# Patient Record
Sex: Male | Born: 1965
Health system: Southern US, Community
[De-identification: ages and names within clinical notes are randomized; demographics above are authoritative.]

## PROBLEM LIST (undated history)

## (undated) DIAGNOSIS — N400 Enlarged prostate without lower urinary tract symptoms: Secondary | ICD-10-CM

## (undated) DIAGNOSIS — Z8739 Personal history of other diseases of the musculoskeletal system and connective tissue: Secondary | ICD-10-CM

## (undated) DIAGNOSIS — H409 Unspecified glaucoma: Secondary | ICD-10-CM

## (undated) DIAGNOSIS — M109 Gout, unspecified: Secondary | ICD-10-CM

## (undated) DIAGNOSIS — M1990 Unspecified osteoarthritis, unspecified site: Secondary | ICD-10-CM

## (undated) DIAGNOSIS — M87059 Idiopathic aseptic necrosis of unspecified femur: Secondary | ICD-10-CM

## (undated) DIAGNOSIS — N189 Chronic kidney disease, unspecified: Secondary | ICD-10-CM

## (undated) DIAGNOSIS — I10 Essential (primary) hypertension: Secondary | ICD-10-CM

## (undated) DIAGNOSIS — I499 Cardiac arrhythmia, unspecified: Secondary | ICD-10-CM

## (undated) DIAGNOSIS — D631 Anemia in chronic kidney disease: Secondary | ICD-10-CM

## (undated) DIAGNOSIS — J189 Pneumonia, unspecified organism: Secondary | ICD-10-CM

## (undated) HISTORY — DX: Gout, unspecified: M10.9

## (undated) HISTORY — DX: Anemia in chronic kidney disease: D63.1

## (undated) HISTORY — PX: OTHER SURGICAL HISTORY: SHX169

## (undated) HISTORY — PX: WISDOM TOOTH EXTRACTION: SHX21

## (undated) HISTORY — DX: Chronic kidney disease, unspecified: N18.9

---
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
## 2012-06-25 ENCOUNTER — Encounter: Payer: Self-pay | Admitting: Family Medicine

## 2012-06-25 ENCOUNTER — Ambulatory Visit (INDEPENDENT_AMBULATORY_CARE_PROVIDER_SITE_OTHER): Payer: 59 | Admitting: Family Medicine

## 2012-06-25 ENCOUNTER — Telehealth: Payer: Self-pay | Admitting: Family Medicine

## 2012-06-25 VITALS — BP 136/90 | HR 99 | Temp 97.7°F | Wt 248.8 lb

## 2012-06-25 DIAGNOSIS — N529 Male erectile dysfunction, unspecified: Secondary | ICD-10-CM

## 2012-06-25 DIAGNOSIS — M109 Gout, unspecified: Secondary | ICD-10-CM

## 2012-06-25 MED ORDER — SILDENAFIL CITRATE 100 MG PO TABS: 100.0000 mg | ORAL_TABLET | Freq: Every day | ORAL | Status: DC | PRN

## 2012-06-25 NOTE — Telephone Encounter (Signed)
appt made

## 2012-06-25 NOTE — Progress Notes (Signed)
  Subjective:    Patient ID: Luke Davila, male    DOB: 07-03-1965, 47 y.o.   MRN: [id number]  HPI This 47 y.o. male presents for evaluation of erectile dysfunction. He c/o difficulty maintaining erection during sexual intercourse.    Review of Systems No chest pain, SOB, HA, dizziness, vision change, N/V, diarrhea, constipation, dysuria, urinary urgency or frequency, myalgias, arthralgias or rash.     Objective:   Physical Exam  Constitutional: He appears well-developed and well-nourished.  HENT:  Head: Normocephalic and atraumatic.  Eyes: Conjunctivae are normal. Pupils are equal, round, and reactive to light.  Cardiovascular: Normal rate and regular rhythm.   Pulmonary/Chest: Effort normal.          Assessment & Plan:  Erectile dysfunction Viagra 100mg  one half to one po qd prn sex #5w/11. Follow up in one month for CPE and CPE labs.

## 2012-06-25 NOTE — Patient Instructions (Signed)
Erectile Dysfunction Erectile dysfunction (ED) is the inability to get a good enough erection to have sexual intercourse. ED may involve:  Inability to get an erection.  Lack of enough hardness to allow penetration.  Loss of the erection before sex is finished.  Premature ejaculation.  Any combination of these problems if they occur more than 25% of the time. CAUSES  Certain drugs, such as:  Pain relievers.  Antihistamines.  Antidepressants.  Blood pressure medicines.  Water pills.  Ulcer medicines.  Muscle relaxants.  Illegal drugs.  Excessive drinking.  Psychological causes, such as:  Anxiety.  Depression.  Sadness.  Exhaustion.  Performance fear.  Stress.  Physical causes, such as:  Artery problems. This may include diabetes, smoking, liver disease, or atherosclerosis.  High blood pressure.  Hormonal problems, such as low testosterone.  Obesity.  Nerve problems. This may include back or pelvic injuries, diabetes, multiple sclerosis, Parkinson's disease, or some surgeries. SYMPTOMS  Inability to get an erection.  Lack of enough hardness to allow penetration.  Loss of the erection before sex is finished.  Premature ejaculation.  Normal erections at some times, but with frequent unsatisfactory episodes.  Orgasms that are not satisfactory in sensation or frequency.  Low sexual satisfaction in either partner because of erection problems.  A curved penis occurring with erection. The curve may cause pain or may be too curved to allow for intercourse.  Never having nighttime erections. DIAGNOSIS Your caregiver can often diagnose this condition by:  Performing a physical exam to find other diseases or specific problems with the penis.  Asking you detailed questions about the problem.  Performing blood tests to check for diabetes or to measure hormone levels.  Performing urine tests to find other underlying health  conditions.  Performing an ultrasound to check for scarring.  Performing a test to check blood flow to the penis.  Doing a sleep study at home to measure nighttime erections. TREATMENT   You may be prescribed medicines by mouth.  You may be given medicine injections into the penis.  You may be prescribed a vacuum pump with a ring.  Penile implant surgery may be performed. You may receive:  An inflatable implant.  A semi-rigid implant.  Blood vessel surgery may be performed. HOME CARE INSTRUCTIONS  Take all medicine as directed by your caregiver. Do not take any other medicines without talking to your caregiver first.  Follow your caregiver's directions for specific treatments as prescribed.  Follow up with your caregiver as directed. Document Released: 12/21/1999 Document Revised: 03/17/2011 Document Reviewed: 04/14/2010 [facility] Patient Information 2014 [place], [place].

## 2012-08-10 ENCOUNTER — Telehealth: Payer: Self-pay | Admitting: Family Medicine

## 2012-08-10 NOTE — Telephone Encounter (Signed)
appt given for tomorrow at 3:00 with [facility]

## 2012-08-11 ENCOUNTER — Encounter: Payer: Self-pay | Admitting: Family Medicine

## 2012-08-11 ENCOUNTER — Ambulatory Visit (INDEPENDENT_AMBULATORY_CARE_PROVIDER_SITE_OTHER): Payer: 59 | Admitting: Family Medicine

## 2012-08-11 VITALS — BP 132/79 | HR 85 | Temp 98.8°F | Ht 75.0 in | Wt 231.0 lb

## 2012-08-11 DIAGNOSIS — M7710 Lateral epicondylitis, unspecified elbow: Secondary | ICD-10-CM

## 2012-08-11 DIAGNOSIS — M7712 Lateral epicondylitis, left elbow: Secondary | ICD-10-CM

## 2012-08-11 DIAGNOSIS — Z0000 Encounter for general adult medical examination without abnormal findings: Secondary | ICD-10-CM

## 2012-08-11 DIAGNOSIS — M109 Gout, unspecified: Secondary | ICD-10-CM

## 2012-08-11 LAB — POCT CBC
Granulocyte percent: 69.4 %G (ref 37–80)
HCT, POC: 37.9 % — AB (ref 43.5–53.7)
Hemoglobin: 12.5 g/dL — AB (ref 14.1–18.1)
Lymph, poc: 1 (ref 0.6–3.4)
MCH, POC: 25 pg — AB (ref 27–31.2)
MCHC: 33 g/dL (ref 31.8–35.4)
MCV: 75.8 fL — AB (ref 80–97)
MPV: 7.5 fL (ref 0–99.8)
POC Granulocyte: 2.6 (ref 2–6.9)
POC LYMPH PERCENT: 25.5 %L (ref 10–50)
Platelet Count, POC: 238 10*3/uL (ref 142–424)
RBC: 5 M/uL (ref 4.69–6.13)
RDW, POC: 13.2 %
WBC: 3.8 10*3/uL — AB (ref 4.6–10.2)

## 2012-08-11 MED ORDER — NAPROXEN 500 MG PO TABS: 500.0000 mg | ORAL_TABLET | Freq: Two times a day (BID) | ORAL | Status: DC

## 2012-08-11 NOTE — Patient Instructions (Signed)
Gout  Gout is an inflammatory condition (arthritis) caused by a buildup of uric acid crystals in the joints. Uric acid is a chemical that is normally present in the blood. Under some circumstances, uric acid can form into crystals in your joints. This causes joint redness, soreness, and swelling (inflammation). Repeat attacks are common. Over time, uric acid crystals can form into masses (tophi) near a joint, causing disfigurement. Gout is treatable and often preventable.  CAUSES   The disease begins with elevated levels of uric acid in the blood. Uric acid is produced by your body when it breaks down a naturally found substance called purines. This also happens when you eat certain foods such as meats and fish. Causes of an elevated uric acid level include:   Being passed down from parent to child (heredity).   Diseases that cause increased uric acid production (obesity, psoriasis, some cancers).   Excessive alcohol use.   Diet, especially diets rich in meat and seafood.   Medicines, including certain cancer-fighting drugs (chemotherapy), diuretics, and aspirin.   Chronic kidney disease. The kidneys are no longer able to remove uric acid well.   Problems with metabolism.  Conditions strongly associated with gout include:   Obesity.   High blood pressure.   High cholesterol.   Diabetes.  Not everyone with elevated uric acid levels gets gout. It is not understood why some people get gout and others do not. Surgery, joint injury, and eating too much of certain foods are some of the factors that can lead to gout.  SYMPTOMS    An attack of gout comes on quickly. It causes intense pain with redness, swelling, and warmth in a joint.   Fever can occur.   Often, only one joint is involved. Certain joints are more commonly involved:   Base of the big toe.   Knee.   Ankle.   Wrist.   Finger.  Without treatment, an attack usually goes away in a few days to weeks. Between attacks, you usually will not have  symptoms, which is different from many other forms of arthritis.  DIAGNOSIS   Your caregiver will suspect gout based on your symptoms and exam. Removal of fluid from the joint (arthrocentesis) is done to check for uric acid crystals. Your caregiver will give you a medicine that numbs the area (local anesthetic) and use a needle to remove joint fluid for exam. Gout is confirmed when uric acid crystals are seen in joint fluid, using a special microscope. Sometimes, blood, urine, and X-ray tests are also used.  TREATMENT   There are 2 phases to gout treatment: treating the sudden onset (acute) attack and preventing attacks (prophylaxis).  Treatment of an Acute Attack   Medicines are used. These include anti-inflammatory medicines or steroid medicines.   An injection of steroid medicine into the affected joint is sometimes necessary.   The painful joint is rested. Movement can worsen the arthritis.   You may use warm or cold treatments on painful joints, depending which works best for you.   Discuss the use of coffee, vitamin C, or cherries with your caregiver. These may be helpful treatment options.  Treatment to Prevent Attacks  After the acute attack subsides, your caregiver may advise prophylactic medicine. These medicines either help your kidneys eliminate uric acid from your body or decrease your uric acid production. You may need to stay on these medicines for a very long time.  The early phase of treatment with prophylactic medicine can be associated   with an increase in acute gout attacks. For this reason, during the first few months of treatment, your caregiver may also advise you to take medicines usually used for acute gout treatment. Be sure you understand your caregiver's directions.  You should also discuss dietary treatment with your caregiver. Certain foods such as meats and fish can increase uric acid levels. Other foods such as dairy can decrease levels. Your caregiver can give you a list of foods  to avoid.  HOME CARE INSTRUCTIONS    Do not take aspirin to relieve pain. This raises uric acid levels.   Only take over-the-counter or prescription medicines for pain, discomfort, or fever as directed by your caregiver.   Rest the joint as much as possible. When in bed, keep sheets and blankets off painful areas.   Keep the affected joint raised (elevated).   Use crutches if the painful joint is in your leg.   Drink enough water and fluids to keep your urine clear or pale yellow. This helps your body get rid of uric acid. Do not drink alcoholic beverages. They slow the passage of uric acid.   Follow your caregiver's dietary instructions. Pay careful attention to the amount of protein you eat. Your daily diet should emphasize fruits, vegetables, whole grains, and fat-free or low-fat milk products.   Maintain a healthy body weight.  SEEK MEDICAL CARE IF:    You have an oral temperature above 102 F (38.9 C).   You develop diarrhea, vomiting, or any side effects from medicines.   You do not feel better in 24 hours, or you are getting worse.  SEEK IMMEDIATE MEDICAL CARE IF:    Your joint becomes suddenly more tender and you have:   Chills.   An oral temperature above 102 F (38.9 C), not controlled by medicine.  MAKE SURE YOU:    Understand these instructions.   Will watch your condition.   Will get help right away if you are not doing well or get worse.  Document Released: 12/21/1999 Document Revised: 03/17/2011 Document Reviewed: 04/02/2009  [employer] Patient Information 2014 [employer].

## 2012-08-11 NOTE — Progress Notes (Signed)
  Subjective:    Patient ID: Luke Davila, male    DOB: [birth year], 47 y.o.   MRN: [id number]  HPI  This 47 y.o. male presents for evaluation of left lateral elbow discomfort. He has had some pain in his left elbow ever since he worked out with [employer].. He states he has hx of gout.  He has ED and is taking viagra without difficulties.  Review of Systems No chest pain, SOB, HA, dizziness, vision change, N/V, diarrhea, constipation, dysuria, urinary urgency or frequency, myalgias, arthralgias or rash.     Objective:   Physical Exam  Vital signs noted  Well developed well nourished male.  HEENT - Head atraumatic Normocephalic                Eyes - PERRLA, Conjuctiva - clear Sclera- Clear EOMI                Ears - EAC's Wnl TM's Wnl Gross Hearing WNL                Nose - Nares patent                 Throat - oropharanx wnl Respiratory - Lungs CTA bilateral Cardiac - RRR S1 and S2 without murmur GI - Abdomen soft Nontender and bowel sounds active x 4 Extremities - No edema. Neuro - Grossly intact.      Assessment & Plan:  Lateral epicondylitis, left - Plan: naproxen (NAPROSYN) 500 MG tablet po bid x 10 days and if not better then follow up.  Routine general medical examination at a health care facility - Plan: POCT CBC, CMP14+EGFR, PSA, total and free, TSH, Lipid panel  Gout - Plan: Uric acid.  Discussed going on low purine diet.  Erectile Dysfunction - Continue viagra and voucher given to patient.

## 2012-08-12 ENCOUNTER — Other Ambulatory Visit: Payer: Self-pay | Admitting: Family Medicine

## 2012-08-12 DIAGNOSIS — D649 Anemia, unspecified: Secondary | ICD-10-CM

## 2012-08-12 DIAGNOSIS — M109 Gout, unspecified: Secondary | ICD-10-CM

## 2012-08-12 LAB — CMP14+EGFR
ALT: 27 IU/L (ref 0–44)
AST: 36 IU/L (ref 0–40)
Albumin/Globulin Ratio: 1.9 (ref 1.1–2.5)
Albumin: 4.9 g/dL (ref 3.5–5.5)
Alkaline Phosphatase: 120 IU/L — ABNORMAL HIGH (ref 39–117)
BUN/Creatinine Ratio: 7 — ABNORMAL LOW (ref 9–20)
BUN: 7 mg/dL (ref 6–24)
CO2: 26 mmol/L (ref 18–29)
Calcium: 9.9 mg/dL (ref 8.7–10.2)
Chloride: 97 mmol/L (ref 97–108)
Creatinine, Ser: 0.96 mg/dL (ref 0.76–1.27)
GFR calc Af Amer: 109 mL/min/{1.73_m2} (ref >59)
GFR calc non Af Amer: 94 mL/min/{1.73_m2} (ref >59)
Globulin, Total: 2.6 g/dL (ref 1.5–4.5)
Glucose: 82 mg/dL (ref 65–99)
Potassium: 4.7 mmol/L (ref 3.5–5.2)
Sodium: 135 mmol/L (ref 134–144)
Total Bilirubin: 0.5 mg/dL (ref 0.0–1.2)
Total Protein: 7.5 g/dL (ref 6.0–8.5)

## 2012-08-12 LAB — TSH: TSH: 2.02 u[IU]/mL (ref 0.450–4.500)

## 2012-08-12 LAB — LIPID PANEL
Chol/HDL Ratio: 3.7 ratio units (ref 0.0–5.0)
Cholesterol, Total: 214 mg/dL — ABNORMAL HIGH (ref 100–199)
HDL: 58 mg/dL (ref >39)
LDL Calculated: 121 mg/dL — ABNORMAL HIGH (ref 0–99)
Triglycerides: 175 mg/dL — ABNORMAL HIGH (ref 0–149)
VLDL Cholesterol Cal: 35 mg/dL (ref 5–40)

## 2012-08-12 LAB — PSA, TOTAL AND FREE
PSA, Free Pct: 12.5 %
PSA, Free: 0.05 ng/mL
PSA: 0.4 ng/mL (ref 0.0–4.0)

## 2012-08-12 LAB — URIC ACID: Uric Acid: 7.2 mg/dL (ref 3.7–8.6)

## 2012-08-12 MED ORDER — ALLOPURINOL 100 MG PO TABS: 100.0000 mg | ORAL_TABLET | Freq: Every day | ORAL | Status: DC

## 2012-08-23 ENCOUNTER — Telehealth: Payer: Self-pay | Admitting: Family Medicine

## 2012-08-24 NOTE — Telephone Encounter (Signed)
Pt aware of all labs 

## 2012-09-09 ENCOUNTER — Other Ambulatory Visit (INDEPENDENT_AMBULATORY_CARE_PROVIDER_SITE_OTHER): Payer: 59

## 2012-09-09 DIAGNOSIS — Z1212 Encounter for screening for malignant neoplasm of rectum: Secondary | ICD-10-CM

## 2012-09-14 ENCOUNTER — Encounter: Payer: Self-pay | Admitting: *Deleted

## 2012-12-01 ENCOUNTER — Telehealth: Payer: Self-pay | Admitting: Family Medicine

## 2012-12-01 NOTE — Telephone Encounter (Signed)
lmom 

## 2012-12-03 ENCOUNTER — Ambulatory Visit (INDEPENDENT_AMBULATORY_CARE_PROVIDER_SITE_OTHER): Payer: 59 | Admitting: Family Medicine

## 2012-12-03 DIAGNOSIS — R350 Frequency of micturition: Secondary | ICD-10-CM

## 2012-12-03 DIAGNOSIS — N400 Enlarged prostate without lower urinary tract symptoms: Secondary | ICD-10-CM

## 2012-12-03 DIAGNOSIS — M129 Arthropathy, unspecified: Secondary | ICD-10-CM

## 2012-12-03 DIAGNOSIS — M1990 Unspecified osteoarthritis, unspecified site: Secondary | ICD-10-CM

## 2012-12-03 DIAGNOSIS — R300 Dysuria: Secondary | ICD-10-CM

## 2012-12-03 MED ORDER — TAMSULOSIN HCL 0.4 MG PO CAPS: 0.4000 mg | ORAL_CAPSULE | Freq: Every day | ORAL | Status: DC

## 2012-12-03 MED ORDER — MELOXICAM 15 MG PO TABS: 15.0000 mg | ORAL_TABLET | Freq: Every day | ORAL | Status: DC

## 2012-12-03 NOTE — Addendum Note (Signed)
Addended by: Prescott Gum on: 12/03/2012 10:58 AM   Modules accepted: Orders

## 2012-12-03 NOTE — Progress Notes (Signed)
   Subjective:    Patient ID: Luke Davila, male    DOB: 10-27-1965, 47 y.o.   MRN: [id number]  HPI Patient presents today with c/o urinary frequency and arthritis sx's in the am. He has some urinary hesitency and nocturia.  Review of Systems C/o arthralgias, urinary frequency No chest pain, SOB, HA, dizziness, vision change, N/V, diarrhea, constipation or rash.     Objective:   Physical Exam Vital signs noted  Well developed well nourished male.  HEENT - Head atraumatic Normocephalic                Eyes - PERRLA, Conjuctiva - clear Sclera- Clear EOMI                Ears - EAC's Wnl TM's Wnl Gross Hearing WNL                Nose - Nares patent                 Throat - oropharanx wnl Respiratory - Lungs CTA bilateral Cardiac - RRR S1 and S2 without murmur GI - Abdomen soft Nontender and bowel sounds active x 4 Extremities - No edema. Neuro - Grossly intact.       Assessment & Plan:  Dysuria - Plan: CANCELED: POCT UA - Microscopic Only, CANCELED: POCT urinalysis dipstick  Urine frequency - Plan: POCT UA - Microscopic Only, POCT urinalysis dipstick  BPH (benign prostatic hyperplasia) - Plan: tamsulosin (FLOMAX) 0.4 MG CAPS capsule  Arthritis - Plan: meloxicam (MOBIC) 15 MG tablet  Deatra Canter FNP

## 2012-12-15 ENCOUNTER — Other Ambulatory Visit (INDEPENDENT_AMBULATORY_CARE_PROVIDER_SITE_OTHER): Payer: 59

## 2012-12-15 DIAGNOSIS — R350 Frequency of micturition: Secondary | ICD-10-CM

## 2012-12-15 LAB — POCT URINALYSIS DIPSTICK
Bilirubin, UA: NEGATIVE
Glucose, UA: NEGATIVE
Ketones, UA: NEGATIVE
Leukocytes, UA: NEGATIVE
Nitrite, UA: NEGATIVE
Spec Grav, UA: 1.01
Urobilinogen, UA: NEGATIVE
pH, UA: 7.5

## 2012-12-15 LAB — POCT UA - MICROSCOPIC ONLY
Casts, Ur, LPF, POC: NEGATIVE
Crystals, Ur, HPF, POC: NEGATIVE
Mucus, UA: NEGATIVE
WBC, Ur, HPF, POC: NEGATIVE
Yeast, UA: NEGATIVE

## 2012-12-15 NOTE — Progress Notes (Signed)
Patient came in for labs only.

## 2012-12-20 ENCOUNTER — Telehealth: Payer: Self-pay | Admitting: Family Medicine

## 2012-12-23 NOTE — Telephone Encounter (Signed)
Luke Davila can you review urine test results for 12/10. Pt wants results.

## 2012-12-23 NOTE — Telephone Encounter (Signed)
Normal urine results

## 2012-12-24 NOTE — Telephone Encounter (Signed)
Patient aware of results.

## 2013-04-18 ENCOUNTER — Ambulatory Visit (INDEPENDENT_AMBULATORY_CARE_PROVIDER_SITE_OTHER): Payer: 59 | Admitting: Diagnostic Neuroimaging

## 2013-04-18 ENCOUNTER — Encounter: Payer: Self-pay | Admitting: Diagnostic Neuroimaging

## 2013-04-18 ENCOUNTER — Encounter (INDEPENDENT_AMBULATORY_CARE_PROVIDER_SITE_OTHER): Payer: Self-pay

## 2013-04-18 VITALS — BP 135/64 | HR 90 | Ht 75.0 in | Wt 243.0 lb

## 2013-04-18 DIAGNOSIS — H538 Other visual disturbances: Secondary | ICD-10-CM

## 2013-04-18 DIAGNOSIS — H53481 Generalized contraction of visual field, right eye: Secondary | ICD-10-CM

## 2013-04-18 DIAGNOSIS — H53489 Generalized contraction of visual field, unspecified eye: Secondary | ICD-10-CM

## 2013-04-18 MED ORDER — THERA VITAL M PO TABS: 1.0000 | ORAL_TABLET | Freq: Every day | ORAL | Status: DC

## 2013-04-18 MED ORDER — ASPIRIN 81 MG PO TABS: 81.0000 mg | ORAL_TABLET | Freq: Every day | ORAL | Status: DC

## 2013-04-18 NOTE — Patient Instructions (Signed)
Start aspirin 81 mg daily.  Start daily multivitamin.  Reduce alcohol use (maximum 1 beer per day).  I will order additional testing.

## 2013-04-18 NOTE — Progress Notes (Signed)
[facility]  PATIENT: Luke RoseSamuel Cahue DOB: 02/27/1965  REFERRING CLINICIAN: Lyla SonY Le, OD HISTORY FROM: patient  REASON FOR VISIT: new consult    HISTORICAL  CHIEF COMPLAINT:  Chief Complaint  Patient presents with  . Eye Problem    R eye, blurry vision    HISTORY OF PRESENT ILLNESS:   48 year old male here for evaluation of right eye visual problem.  End of December 2014 patient woke up with right hip pain and weakness. Around the same time he noticed right eye visual blurring. Patient went to the emergency room and was evaluated. He was given a steroid injection and tablets for the right hip problem. He was given topical medications for his right eye problem. His right hip problem gradually improve. His right eye problem did not. He went to the PCP, eye Dr., and a second eye doctor for this problem. The second eye Dr. noted visual field defect in the right eye as well as right optic nerve pallor. Patient referred to me for neurologic evaluation.  No symptoms of left eye. No symptoms of left arm and left leg. No slurred speech or trouble talking. No prodromal symptoms or triggering events.  REVIEW OF SYSTEMS: Full 14 system review of systems performed and notable only for feeling cold blurred vision loss of vision rash.  ALLERGIES: No Known Allergies  HOME MEDICATIONS: Outpatient Prescriptions Prior to Visit  Medication Sig Dispense Refill  . allopurinol (ZYLOPRIM) 100 MG tablet Take 1 tablet (100 mg total) by mouth daily.  30 tablet  6  . meloxicam (MOBIC) 15 MG tablet Take 1 tablet (15 mg total) by mouth daily.  30 tablet  11  . naproxen (NAPROSYN) 500 MG tablet Take 1 tablet (500 mg total) by mouth 2 (two) times daily with a meal.  30 tablet  0  . sildenafil (VIAGRA) 100 MG tablet Take 1 tablet (100 mg total) by mouth daily as needed for erectile dysfunction.  5 tablet  11  . tamsulosin (FLOMAX) 0.4 MG CAPS capsule Take 1 capsule (0.4 mg total) by mouth  daily.  30 capsule  11   No facility-administered medications prior to visit.    PAST MEDICAL HISTORY: Past Medical History  Diagnosis Date  . Gout     PAST SURGICAL HISTORY: Past Surgical History  Procedure Laterality Date  . Foot surgery Left 1985  . Left hand surgery      FAMILY HISTORY: History reviewed. No pertinent family history.  SOCIAL HISTORY:  History   Social History  . Marital Status: Married    Spouse Name: Ramona    Number of Children: 2  . Years of Education: 12th   Occupational History  .      [employer]   Social History Main Topics  . Smoking status: Never Smoker   . Smokeless tobacco: Never Used  . Alcohol Use: Yes     Comment: 3 beers daily  . Drug Use: No  . Sexual Activity: Not on file   Other Topics Concern  . Not on file   Social History Narrative   Patient lives at home with his family.   Caffeine Use: 1 soda daily     PHYSICAL EXAM  Filed Vitals:   04/18/13 0904  BP: 135/64  Pulse: 90  Height: 6\' 3"  (1.905 m)  Weight: 243 lb (110.224 kg)    Not recorded    Body mass index is 30.37 kg/(m^2).  GENERAL EXAM: Patient is in no distress; well developed,  nourished and groomed; neck is supple  CARDIOVASCULAR: Regular rate and rhythm, no murmurs, no carotid bruits  NEUROLOGIC: MENTAL STATUS: awake, alert, oriented to person, place and time, recent and remote memory intact, normal attention and concentration, language fluent, comprehension intact, naming intact, fund of knowledge appropriate CRANIAL NERVE: no papilledema on fundoscopic exam, RELATIVE RIGHT AFFERENT PUPILLARY DEFECT, LEFT PUPIL NORMAL, visual fields full to confrontation, RIGHT EYE 20/100, LEFT EYE 20/20, extraocular muscles intact, no nystagmus, facial sensation and strength symmetric, hearing intact, palate elevates symmetrically, uvula midline, shoulder shrug symmetric, tongue midline. MOTOR: normal bulk and tone, full strength in the BUE,  BLE SENSORY: normal and symmetric to light touch, pinprick, temperature, vibration COORDINATION: finger-nose-finger, fine finger movements, heel-shin normal REFLEXES: deep tendon reflexes BRISK and symmetric; DOWN GOING TOES, POSITIVE HOFFMANS, POSITIVE CROSSED ADDUCTORS GAIT/STATION: narrow based gait; STIFF GAIT, able to walk on toes, heels and tandem; romberg is negative    DIAGNOSTIC DATA (LABS, IMAGING, TESTING) - I reviewed patient records, labs, notes, testing and imaging myself where available.  Lab Results  Component Value Date   WBC 3.8* 08/11/2012   HGB 12.5* 08/11/2012   HCT 37.9* 08/11/2012   MCV 75.8* 08/11/2012      Component Value Date/Time   NA 135 08/11/2012 1558   K 4.7 08/11/2012 1558   CL 97 08/11/2012 1558   CO2 26 08/11/2012 1558   GLUCOSE 82 08/11/2012 1558   BUN 7 08/11/2012 1558   CREATININE 0.96 08/11/2012 1558   CALCIUM 9.9 08/11/2012 1558   PROT 7.5 08/11/2012 1558   AST 36 08/11/2012 1558   ALT 27 08/11/2012 1558   ALKPHOS 120* 08/11/2012 1558   BILITOT 0.5 08/11/2012 1558   GFRNONAA 94 08/11/2012 1558   GFRAA 109 08/11/2012 1558   Lab Results  Component Value Date   HDL 58 08/11/2012   LDLCALC 098121* 08/11/2012   TRIG 175* 08/11/2012   CHOLHDL 3.7 08/11/2012   No results found for this basename: HGBA1C   No results found for this basename: VITAMINB12   Lab Results  Component Value Date   TSH 2.020 08/11/2012    04/04/13 Vision field testing - my review of humphrey matrix shows right eye, right inferior nasal defect; not temporal defect.    ASSESSMENT AND PLAN  48 y.o. year old male here with sudden right eye vision blurred (Dec 2014). Also with right hip pain/weakness, which has resolved. Exam notable for relative afferent pupillary defect in the right eye and diffuse hyperreflexia.   Localization: optic nerve vs optic chiasm  Ddx: optic neuropathy (ischemic, infectious, inflamm, compressive, toxic, metabolic), CNS autoimmune/inflamm disease, CNS vascular disease  PLAN: -  additional workup as below - will consider second opinion with retinal specialist   Orders Placed This Encounter  Procedures  . MR Brain W Wo Contrast  . MR Orbits WO/W Cm  . US Carotid Bilateral  . Sedimentation Rate  . C-reactive Protein  . TSH  . Hemoglobin A1c  . Lipid Panel  . Vitamin B12  . Vitamin B1  . Folate RBC  . Angiotensin converting enzyme  . Lyme, Total Ab Test/Reflex  . RPR  . ANA w/Reflex  . HIV antibody  . 2D Echocardiogram without contrast    Meds ordered this encounter  Medications  . triamcinolone cream (KENALOG) 0.1 %    Sig: Apply 1 application topically daily.  Marland Kitchen. aspirin 81 MG tablet    Sig: Take 1 tablet (81 mg total) by mouth daily.  . Multiple  Vitamins-Minerals (MULTIVITAMIN) tablet    Sig: Take 1 tablet by mouth daily.    Return in about 6 weeks (around 05/30/2013).    Suanne Marker, MD 04/18/2013, 10:03 AM Certified in Neurology, Neurophysiology and Neuroimaging  [facility] [address], [address] [place], [place] [address] [phone]

## 2013-04-19 LAB — VITAMIN B1

## 2013-04-21 ENCOUNTER — Ambulatory Visit (INDEPENDENT_AMBULATORY_CARE_PROVIDER_SITE_OTHER): Payer: 59

## 2013-04-21 DIAGNOSIS — H53489 Generalized contraction of visual field, unspecified eye: Secondary | ICD-10-CM

## 2013-04-21 DIAGNOSIS — H538 Other visual disturbances: Secondary | ICD-10-CM

## 2013-04-21 DIAGNOSIS — H53481 Generalized contraction of visual field, right eye: Secondary | ICD-10-CM

## 2013-04-21 MED ORDER — GADOPENTETATE DIMEGLUMINE 469.01 MG/ML IV SOLN: 20.0000 mL | Freq: Once | INTRAVENOUS | Status: AC | PRN

## 2013-04-22 LAB — HEMOGLOBIN A1C
Est. average glucose Bld gHb Est-mCnc: 120 mg/dL
Hgb A1c MFr Bld: 5.8 % — ABNORMAL HIGH (ref 4.8–5.6)

## 2013-04-22 LAB — RPR: SYPHILIS RPR SCR: NONREACTIVE

## 2013-04-22 LAB — FOLATE RBC
Folate, Hemolysate: 426.9 ng/mL
Folate, RBC: 1170 ng/mL (ref 499–1504)
HCT: 36.5 % — ABNORMAL LOW (ref 37.5–51.0)

## 2013-04-22 LAB — ANGIOTENSIN CONVERTING ENZYME: Angio Convert Enzyme: 112 U/L — ABNORMAL HIGH (ref 14–82)

## 2013-04-22 LAB — LIPID PANEL
Chol/HDL Ratio: 4.2 ratio units (ref 0.0–5.0)
Cholesterol, Total: 243 mg/dL — ABNORMAL HIGH (ref 100–199)
HDL: 58 mg/dL (ref >39)
LDL Calculated: 160 mg/dL — ABNORMAL HIGH (ref 0–99)
Triglycerides: 126 mg/dL (ref 0–149)
VLDL CHOLESTEROL CAL: 25 mg/dL (ref 5–40)

## 2013-04-22 LAB — HIV ANTIBODY (ROUTINE TESTING W REFLEX)
HIV 1/O/2 Abs-Index Value: <1 (ref <1.00)
HIV-1/HIV-2 Ab: NONREACTIVE

## 2013-04-22 LAB — LYME, TOTAL AB TEST/REFLEX: Lyme IgG/IgM Ab: <0.91 {ISR} (ref 0.00–0.90)

## 2013-04-22 LAB — VITAMIN B12: VITAMIN B 12: 463 pg/mL (ref 211–946)

## 2013-04-22 LAB — ANA W/REFLEX: ANA: NEGATIVE

## 2013-04-22 LAB — TSH: TSH: 2.62 u[IU]/mL (ref 0.450–4.500)

## 2013-04-22 LAB — C-REACTIVE PROTEIN: CRP: 0.9 mg/L (ref 0.0–4.9)

## 2013-04-22 LAB — VITAMIN B1, WHOLE BLOOD: THIAMINE: 75.5 nmol/L (ref 66.5–200.0)

## 2013-04-22 LAB — SEDIMENTATION RATE: SED RATE: 5 mm/h (ref 0–15)

## 2013-04-26 ENCOUNTER — Telehealth: Payer: Self-pay | Admitting: *Deleted

## 2013-04-26 NOTE — Telephone Encounter (Signed)
Pt called inquiring about results of MRI and BW.  Please call patient at earliest convenience.  Thanks

## 2013-04-27 ENCOUNTER — Telehealth: Payer: Self-pay | Admitting: *Deleted

## 2013-04-27 NOTE — Telephone Encounter (Signed)
Spoke with patient's wife Luke Davila(Ramona), informed her of patient's appointment on 05/06/13 @ 1 pm with arrival time of 12:45 pm, informed Ramona that if he is unable to keep this appointment to call [phone], to reschedule.  tient

## 2013-04-27 NOTE — Telephone Encounter (Signed)
Pt is calling requesting MRI and lab work results. Please advise

## 2013-05-06 ENCOUNTER — Ambulatory Visit (INDEPENDENT_AMBULATORY_CARE_PROVIDER_SITE_OTHER): Payer: 59

## 2013-05-06 ENCOUNTER — Other Ambulatory Visit (HOSPITAL_COMMUNITY): Payer: Self-pay

## 2013-05-06 DIAGNOSIS — H538 Other visual disturbances: Secondary | ICD-10-CM

## 2013-05-06 DIAGNOSIS — H53481 Generalized contraction of visual field, right eye: Secondary | ICD-10-CM

## 2013-05-06 DIAGNOSIS — H53489 Generalized contraction of visual field, unspecified eye: Secondary | ICD-10-CM

## 2013-05-06 NOTE — Telephone Encounter (Signed)
pls call pt with normal MRI and labs. -VRP

## 2013-05-09 ENCOUNTER — Encounter (HOSPITAL_COMMUNITY): Payer: Self-pay | Admitting: Cardiology

## 2013-05-09 NOTE — Telephone Encounter (Signed)
Called pt to inform him per Dr. Marjory LiesPenumalli that his MRI and Lab results were normal and if he has any other problems, questions or concerns to call the office. Pt verbalized understanding.

## 2013-05-11 ENCOUNTER — Other Ambulatory Visit: Payer: Self-pay | Admitting: *Deleted

## 2013-05-11 NOTE — Telephone Encounter (Signed)
You last filled this 06/2012 for 1 year, last seen 12/03/12

## 2013-05-12 ENCOUNTER — Other Ambulatory Visit: Payer: Self-pay | Admitting: Family Medicine

## 2013-05-12 MED ORDER — SILDENAFIL CITRATE 100 MG PO TABS: 100.0000 mg | ORAL_TABLET | Freq: Every day | ORAL | Status: DC | PRN

## 2013-05-31 ENCOUNTER — Ambulatory Visit (INDEPENDENT_AMBULATORY_CARE_PROVIDER_SITE_OTHER): Payer: 59 | Admitting: *Deleted

## 2013-05-31 ENCOUNTER — Encounter (INDEPENDENT_AMBULATORY_CARE_PROVIDER_SITE_OTHER): Payer: Self-pay

## 2013-05-31 ENCOUNTER — Other Ambulatory Visit: Payer: Self-pay | Admitting: *Deleted

## 2013-05-31 ENCOUNTER — Ambulatory Visit (INDEPENDENT_AMBULATORY_CARE_PROVIDER_SITE_OTHER): Payer: 59 | Admitting: Diagnostic Neuroimaging

## 2013-05-31 ENCOUNTER — Encounter: Payer: Self-pay | Admitting: Diagnostic Neuroimaging

## 2013-05-31 ENCOUNTER — Telehealth: Payer: Self-pay | Admitting: *Deleted

## 2013-05-31 VITALS — BP 129/86 | HR 85 | Temp 98.6°F | Ht 75.5 in | Wt 247.0 lb

## 2013-05-31 VITALS — BP 149/94 | HR 79

## 2013-05-31 DIAGNOSIS — H469 Unspecified optic neuritis: Secondary | ICD-10-CM

## 2013-05-31 DIAGNOSIS — G360 Neuromyelitis optica [Devic]: Secondary | ICD-10-CM

## 2013-05-31 MED ORDER — SODIUM CHLORIDE 0.9 % IV SOLN
1000.0000 mg | INTRAVENOUS | Status: DC
  Administered 2013-05-31: 1000 mg via INTRAVENOUS

## 2013-05-31 NOTE — Telephone Encounter (Signed)
I called [facility], spoke to [facility] in pharmacy.  They will call pt to schedule for tomorrow.  I LMVM for pt on cell # to expect there call.  I left there phone [phone].

## 2013-05-31 NOTE — Progress Notes (Signed)
Pt here for appt.   Order for solumedrol 1000mg  IV daily x 3 days.  First dose here in clinic next 2 doses via HH.

## 2013-05-31 NOTE — Patient Instructions (Signed)
I will treat you with IV steroids for 3 days.  I will check MRI spine studies.  I will check chest xray and other lab testing.

## 2013-05-31 NOTE — Progress Notes (Signed)
[facility]  PATIENT: Luke Davila DOB: 28-Mar-1965  REFERRING CLINICIAN:   HISTORY FROM: patient  REASON FOR VISIT: follow up   HISTORICAL  CHIEF COMPLAINT:  Chief Complaint  Patient presents with  . Follow-up    blurred vision, 6 wks per instructions, rm 7    HISTORY OF PRESENT ILLNESS:   UPDATE 05/31/13: Right eye vision problems stable. No change. Still with stiffness in legs, balance diff. No new neurologic symptoms.   PRIOR HPI (04/18/13): 48 year old male here for evaluation of right eye visual problem. End of December 2014 patient woke up with right hip pain and weakness. Around the same time he noticed right eye visual blurring. Patient went to the emergency room and was evaluated. He was given a steroid injection and tablets for the right hip problem. He was given topical medications for his right eye problem. His right hip problem gradually improve. His right eye problem did not. He went to the PCP, eye Dr., and a second eye doctor for this problem. The second eye doctor noted visual field defect in the right eye as well as right optic nerve pallor. Patient referred to me for neurologic evaluation. No symptoms of left eye. No symptoms of left arm and left leg. No slurred speech or trouble talking. No prodromal symptoms or triggering events.  REVIEW OF SYSTEMS: Full 14 system review of systems performed and notable only for blurred vision loss of vision.   ALLERGIES: No Known Allergies  HOME MEDICATIONS: Outpatient Prescriptions Prior to Visit  Medication Sig Dispense Refill  . allopurinol (ZYLOPRIM) 100 MG tablet Take 1 tablet (100 mg total) by mouth daily.  30 tablet  6  . aspirin 81 MG tablet Take 1 tablet (81 mg total) by mouth daily.      . meloxicam (MOBIC) 15 MG tablet Take 1 tablet (15 mg total) by mouth daily.  30 tablet  11  . Multiple Vitamins-Minerals (MULTIVITAMIN) tablet Take 1 tablet by mouth daily.      . naproxen (NAPROSYN) 500 MG  tablet Take 1 tablet (500 mg total) by mouth 2 (two) times daily with a meal.  30 tablet  0  . sildenafil (VIAGRA) 100 MG tablet Take 1 tablet (100 mg total) by mouth daily as needed for erectile dysfunction.  5 tablet  11  . tamsulosin (FLOMAX) 0.4 MG CAPS capsule Take 1 capsule (0.4 mg total) by mouth daily.  30 capsule  11  . triamcinolone cream (KENALOG) 0.1 % Apply 1 application topically daily.       No facility-administered medications prior to visit.    PAST MEDICAL HISTORY: Past Medical History  Diagnosis Date  . Gout     PAST SURGICAL HISTORY: Past Surgical History  Procedure Laterality Date  . Foot surgery Left 1985  . Left hand surgery      FAMILY HISTORY: No family history on file.  SOCIAL HISTORY:  History   Social History  . Marital Status: Married    Spouse Name: Ramona    Number of Children: 2  . Years of Education: 12th   Occupational History  .      Public house manager   Social History Main Topics  . Smoking status: Never Smoker   . Smokeless tobacco: Never Used  . Alcohol Use: Yes     Comment: 3 beers daily  . Drug Use: No  . Sexual Activity: Not on file   Other Topics Concern  . Not on file   Social History  Narrative   Patient lives at home with his family.   Caffeine Use: 1 soda daily   Patient is right handed     PHYSICAL EXAM  Filed Vitals:   05/31/13 1106  BP: 129/86  Pulse: 85  Temp: 98.6 F (37 C)  TempSrc: Oral  Height: 6' 3.5" (1.918 m)  Weight: 247 lb (112.038 kg)    Not recorded    Body mass index is 30.46 kg/(m^2).  GENERAL EXAM: Patient is in no distress; well developed, nourished and groomed; neck is supple  CARDIOVASCULAR: Regular rate and rhythm, no murmurs, no carotid bruits  NEUROLOGIC: MENTAL STATUS: awake, alert, oriented to person, place and time, recent and remote memory intact, normal attention and concentration, language fluent, comprehension intact, naming intact, fund of knowledge  appropriate CRANIAL NERVE: no papilledema on fundoscopic exam, RIGHT AFFERENT PUPILLARY DEFECT, LEFT PUPIL NORMAL, visual fields full to confrontation, RIGHT EYE 20/200, LEFT EYE 20/20, extraocular muscles intact, no nystagmus, facial sensation and strength symmetric, hearing intact, palate elevates symmetrically, uvula midline, shoulder shrug symmetric, tongue midline. MOTOR: normal bulk and tone, full strength in the BUE, BLE SENSORY: normal and symmetric to light touch, pinprick, temperature, vibration COORDINATION: finger-nose-finger, fine finger movements normal REFLEXES: deep tendon reflexes BRISK and symmetric; DOWN GOING TOES, POSITIVE HOFFMANS, POSITIVE CROSSED ADDUCTORS GAIT/STATION: narrow based gait; STIFF GAIT, able to walk on toes, heels and tandem; romberg is negative    DIAGNOSTIC DATA (LABS, IMAGING, TESTING) - I reviewed patient records, labs, notes, testing and imaging myself where available.  Lab Results  Component Value Date   WBC 3.8* 08/11/2012   HGB 12.5* 08/11/2012   HCT 36.5* 04/18/2013   MCV 75.8* 08/11/2012      Component Value Date/Time   NA 135 08/11/2012 1558   K 4.7 08/11/2012 1558   CL 97 08/11/2012 1558   CO2 26 08/11/2012 1558   GLUCOSE 82 08/11/2012 1558   BUN 7 08/11/2012 1558   CREATININE 0.96 08/11/2012 1558   CALCIUM 9.9 08/11/2012 1558   PROT 7.5 08/11/2012 1558   AST 36 08/11/2012 1558   ALT 27 08/11/2012 1558   ALKPHOS 120* 08/11/2012 1558   BILITOT 0.5 08/11/2012 1558   GFRNONAA 94 08/11/2012 1558   GFRAA 109 08/11/2012 1558   Lab Results  Component Value Date   HDL 58 04/18/2013   LDLCALC 160* 04/18/2013   TRIG 126 04/18/2013   CHOLHDL 4.2 04/18/2013   Lab Results  Component Value Date   HGBA1C 5.8* 04/18/2013   Lab Results  Component Value Date   [id number] 338 04/18/2013   Lab Results  Component Value Date   TSH 2.620 04/18/2013    04/04/13 Vision field testing - my review of humphrey matrix shows right eye, right inferior nasal defect; not temporal defect.    I reviewed images myself and disagree with interpretation (reported as normal). I have included my interpretation below:  04/21/13 MRI brain - hazy periventricular gliosis; few punctate subcortical foci of non-specific gliosis.  04/21/13 MRI orbits - enhancing right optic nerve  04/18/13 Labs - ESR 5, CRP 0.9, TSH 2.620, A1c 5.8, Lipids (CHOL 243, TRIG 126, HDL 58, LDL 160), B12 463, ACE 112, Lyme < 0.91, RPR NR, ANA NR, HIV < 1.00, B1 75.5.   ASSESSMENT AND PLAN  48 y.o. year old male here with sudden right eye vision blurred (Dec 2014). Also with right hip pain/weakness, which has resolved. Exam notable for relative afferent pupillary defect in the right eye and diffuse  hyperreflexia. MRI brain/orbits confirm wight optic nerve enhancement, with non-specific periventricular gliosis.  Ddx: optic neuritis (isolated ON vs multiple sclerosis vs neuromyelitis optic), optic neuropathy (ischemic, post-infectious, inflamm, metabolic), CNS autoimmune/inflamm disease, CNS vascular disease  PLAN: - MRI cervical and thoracic spine - NMO antibodies - may consider LP  - treat with solumedrol 1026m IV x 3 days  Orders Placed This Encounter  Procedures  . MR Cervical Spine W Wo Contrast  . MR Thoracic Spine W Wo Contrast  . DG Chest 2 View  . NMO IgG Autoantibodies   Return in about 3 months (around 08/31/2013).    VPenni Bombard MD 53/90/3009 123:30PM Certified in Neurology, Neurophysiology and Neuroimaging  [facility] [address] [place] [phone]

## 2013-05-31 NOTE — Patient Instructions (Signed)
Pt to check out.  To be called about HH and remaining 2 doses.  Pt verbalized understanding.

## 2013-05-31 NOTE — Progress Notes (Signed)
Pt here for appt. Order for Solumedrol 1000mg  IV daily x 3 days.  Pt brought back to treatment room.  Made comfortable.  Instructions given on plan of care.  NKA.  Under aseptic technique 23g butterfly inserted to L inner forearm, lab for NMO IgG drawn then Solumedrol 1000mg  / 0.9%NS started at 1221.  Infusing well.  Pt given water, peppermint, magazine.  MRI to be set up, MRI questionaire filled out.  Pt informed of [facility]   Infusion completed today 1309.  IV butterfly discontinued. Pressure applied and bandage applied.  Pt to check out.  Instructed will call re: [facility] plan.   VS 146/94, Hr 79.  Went over lab results.  Pt questioning about diabetes.  Informed and copies given to pt.

## 2013-06-01 LAB — SPECIMEN STATUS REPORT

## 2013-06-02 LAB — NMO IGG AUTOANTIBODIES

## 2013-06-03 ENCOUNTER — Ambulatory Visit (HOSPITAL_COMMUNITY)
Admission: RE | Admit: 2013-06-03 | Discharge: 2013-06-03 | Disposition: A | Discharge status: Home / Self Care | Payer: 59 | Source: Ambulatory Visit | Attending: Diagnostic Neuroimaging | Admitting: Diagnostic Neuroimaging

## 2013-06-03 DIAGNOSIS — H469 Unspecified optic neuritis: Secondary | ICD-10-CM

## 2013-06-03 DIAGNOSIS — Z0389 Encounter for observation for other suspected diseases and conditions ruled out: Secondary | ICD-10-CM | POA: Insufficient documentation

## 2013-06-09 ENCOUNTER — Telehealth: Payer: Self-pay | Admitting: Diagnostic Neuroimaging

## 2013-06-09 NOTE — Telephone Encounter (Signed)
Patient states that he has an MRI scheduled for Saturday, but states that he needs to have labwork done before it. Patient wants to know where he has to go for labs, I advised patient that we have someone here at our office that does it but he is convinced someone told him otherwise. Please return call to patient and advise.

## 2013-06-10 NOTE — Telephone Encounter (Signed)
Called pt and left message for pt to call back.

## 2013-06-10 NOTE — Telephone Encounter (Signed)
Pt is to have labs drawn at  [facility], [address]. (see appt).

## 2013-06-11 ENCOUNTER — Inpatient Hospital Stay: Admission: RE | Admit: 2013-06-11 | Payer: 59 | Source: Ambulatory Visit

## 2013-06-11 ENCOUNTER — Other Ambulatory Visit: Payer: 59

## 2013-06-21 ENCOUNTER — Ambulatory Visit
Admission: RE | Admit: 2013-06-21 | Discharge: 2013-06-21 | Disposition: A | Discharge status: Home / Self Care | Payer: 59 | Source: Ambulatory Visit | Attending: Diagnostic Neuroimaging | Admitting: Diagnostic Neuroimaging

## 2013-06-21 DIAGNOSIS — H469 Unspecified optic neuritis: Secondary | ICD-10-CM

## 2013-06-21 MED ORDER — GADOBENATE DIMEGLUMINE 529 MG/ML IV SOLN
20.0000 mL | Freq: Once | INTRAVENOUS | Status: AC | PRN
  Administered 2013-06-21: 20 mL via INTRAVENOUS

## 2013-08-10 ENCOUNTER — Telehealth: Payer: Self-pay | Admitting: Diagnostic Neuroimaging

## 2013-08-10 NOTE — Telephone Encounter (Signed)
Please advise previous note. Thanks  °

## 2013-08-10 NOTE — Telephone Encounter (Signed)
Noted  

## 2013-08-10 NOTE — Telephone Encounter (Signed)
Patient calling to request MRI results, please return call to patient and advise.  °

## 2013-08-11 NOTE — Telephone Encounter (Signed)
Noted  

## 2013-08-12 NOTE — Telephone Encounter (Signed)
No spinal cord lesions. Therefore, no further support for multiple sclerosis diagnosis at this time. Some degenerative changes and pinched nerves, but nothing related to his optic neuritis. Continue current plan and follow up in clinic. -VRP

## 2013-08-15 ENCOUNTER — Ambulatory Visit (INDEPENDENT_AMBULATORY_CARE_PROVIDER_SITE_OTHER): Payer: 59 | Admitting: Family Medicine

## 2013-08-15 ENCOUNTER — Encounter: Payer: Self-pay | Admitting: Family Medicine

## 2013-08-15 ENCOUNTER — Ambulatory Visit (INDEPENDENT_AMBULATORY_CARE_PROVIDER_SITE_OTHER): Payer: 59

## 2013-08-15 VITALS — BP 141/70 | HR 82 | Temp 98.5°F | Ht 75.5 in | Wt 251.2 lb

## 2013-08-15 DIAGNOSIS — M25562 Pain in left knee: Secondary | ICD-10-CM

## 2013-08-15 DIAGNOSIS — M25569 Pain in unspecified knee: Secondary | ICD-10-CM

## 2013-08-15 MED ORDER — MELOXICAM 15 MG PO TABS: 15.0000 mg | ORAL_TABLET | Freq: Every day | ORAL | Status: DC

## 2013-08-15 NOTE — Telephone Encounter (Signed)
Called pt and left message for pt to give our office a call back for MRI results.

## 2013-08-15 NOTE — Progress Notes (Signed)
   Subjective:    Patient ID: Luke Davila, male    DOB: [birth year], 48 y.o.   MRN: [id number]  HPI This 48 y.o. male presents for evaluation of left knee pain and discomfort.   Review of Systems No chest pain, SOB, HA, dizziness, vision change, N/V, diarrhea, constipation, dysuria, urinary urgency or frequency, myalgias, arthralgias or rash.     Objective:   Physical Exam Vital signs noted  Well developed well nourished male.  HEENT - Head atraumatic Normocephalic Respiratory - Lungs CTA bilateral Cardiac - RRR S1 and S2 without murmur GI - Abdomen soft Nontender and bowel sounds active x 4 MS - TTP left knee   Xray - Left knee - joint space narrowing Prelimnary reading by Chrissie NoaWilliam Zarai Orsborn,FNP    Assessment & Plan:  Left knee pain - Plan: DG Knee 1-2 Views Right, meloxicam (MOBIC) 15 MG tablet Po qd.  Knee brace.  Follow up prn.    Luke CanterWilliam J Madysun Thall FNP

## 2013-08-15 NOTE — Telephone Encounter (Signed)
Patient returning Cathy's call regarding MRI results.  Thanks °

## 2013-08-15 NOTE — Telephone Encounter (Signed)
Called pt to inform him per Dr. Marjory LiesPenumalli that the pt's MRI results showed no spinal cord lesions and therefore, no futher support for multiple sclerosis diagnosis at this time. Some degenerative changes and pinched nerves, but nothing related to his optic neuritis and to continue current plan and f/u in clinic. I informed the pt that he has a upcoming appt on 08/30/13 with Dr. Marjory LiesPenumalli and if he has any other problems, questions or concerns to call the office. Pt verbalized understanding.

## 2013-08-18 ENCOUNTER — Other Ambulatory Visit: Payer: Self-pay | Admitting: Family Medicine

## 2013-08-18 NOTE — Telephone Encounter (Signed)
Please advise on refill. Doesn't look like this has been rxd since last year

## 2013-08-24 ENCOUNTER — Telehealth: Payer: Self-pay | Admitting: Nurse Practitioner

## 2013-08-25 ENCOUNTER — Telehealth: Payer: Self-pay | Admitting: Nurse Practitioner

## 2013-08-25 ENCOUNTER — Encounter: Payer: Self-pay | Admitting: Nurse Practitioner

## 2013-08-25 ENCOUNTER — Ambulatory Visit (INDEPENDENT_AMBULATORY_CARE_PROVIDER_SITE_OTHER): Payer: 59 | Admitting: Nurse Practitioner

## 2013-08-25 VITALS — BP 137/82 | HR 83 | Temp 98.0°F | Ht 75.5 in | Wt 262.6 lb

## 2013-08-25 DIAGNOSIS — M25562 Pain in left knee: Secondary | ICD-10-CM

## 2013-08-25 DIAGNOSIS — M25569 Pain in unspecified knee: Secondary | ICD-10-CM

## 2013-08-25 MED ORDER — DICLOFENAC SODIUM 75 MG PO TBEC: 75.0000 mg | DELAYED_RELEASE_TABLET | Freq: Two times a day (BID) | ORAL | Status: DC

## 2013-08-25 NOTE — Telephone Encounter (Signed)
Needs CPE with labs before filling any new meds

## 2013-08-25 NOTE — Patient Instructions (Signed)

## 2013-08-25 NOTE — Progress Notes (Signed)
   Subjective:    Patient ID: Luke RoseSamuel Kintzel, male    DOB: [birth year], 48 y.o.   MRN: [id number]  HPI  Still having left knee pain with swelling for 3 weeks.  Has been seen in office and xray showed no arthritis. Non-steroidals given with no relief.  Pain and popping only during movement. Some swelling.   Review of Systems  Constitutional: Negative.   Respiratory: Negative.   Cardiovascular: Negative.   Musculoskeletal:       Left knee pain with swelling  Skin: Negative.        Objective:   Physical Exam  Constitutional: He appears well-developed and well-nourished.  Cardiovascular: Normal rate, regular rhythm and normal heart sounds.   Pulmonary/Chest: Effort normal and breath sounds normal.  Musculoskeletal: He exhibits edema and tenderness.  Left knee edematous with pain and popping on extension and flexion.  No weakness noted.  Tender with palpation  medial of patellela with movement. ALl ligament sintact   Skin: Skin is warm and dry.  Psychiatric: He has a normal mood and affect. His behavior is normal. Judgment and thought content normal.   BP 137/82  Pulse 83  Temp(Src) 98 F (36.7 C) (Oral)  Ht 6' 3.5" (1.918 m)  Wt 262 lb 9.6 oz (119.115 kg)  BMI 32.38 kg/m2        Assessment & Plan:   1. Left knee pain    Meds ordered this encounter  Medications  . tamsulosin (FLOMAX) 0.4 MG CAPS capsule    Sig:   . diclofenac (VOLTAREN) 75 MG EC tablet    Sig: Take 1 tablet (75 mg total) by mouth 2 (two) times daily.    Dispense:  60 tablet    Refill:  0    Order Specific Question:  Supervising Provider    Answer:  Ernestina PennaMOORE, DONALD W [[id number]]   Orders Placed This Encounter  Procedures  . Ambulatory referral to Orthopedic Surgery    Referral Priority:  Routine    Referral Type:  Surgical    Referral Reason:  Specialty Services Required    Requested Specialty:  Orthopedic Surgery    Number of Visits Requested:  1   Rest  Ice if helps If hurts stop doing what ever  doing  Mary-Margaret Daphine DeutscherMartin, FNP

## 2013-08-25 NOTE — Telephone Encounter (Signed)
This has already been put in

## 2013-08-25 NOTE — Telephone Encounter (Signed)
Patient aware.

## 2013-08-30 ENCOUNTER — Encounter: Payer: Self-pay | Admitting: Diagnostic Neuroimaging

## 2013-08-30 ENCOUNTER — Ambulatory Visit (INDEPENDENT_AMBULATORY_CARE_PROVIDER_SITE_OTHER): Payer: 59 | Admitting: Diagnostic Neuroimaging

## 2013-08-30 VITALS — BP 140/90 | HR 86 | Temp 97.5°F | Ht 75.0 in | Wt 259.8 lb

## 2013-08-30 DIAGNOSIS — H469 Unspecified optic neuritis: Secondary | ICD-10-CM

## 2013-08-30 NOTE — Patient Instructions (Signed)
Monitor symptoms. 

## 2013-08-30 NOTE — Progress Notes (Signed)
Vision Screening results.  20/20 Left,  O Right.  Dr. Marjory Lies made aware.

## 2013-08-30 NOTE — Progress Notes (Signed)
[facility]  PATIENT: Luke Davila DOB: [birth year]  REFERRING CLINICIAN:   HISTORY FROM: patient  REASON FOR VISIT: follow up   HISTORICAL  CHIEF COMPLAINT:  Chief Complaint  Patient presents with  . Follow-up    vision problems    HISTORY OF PRESENT ILLNESS:   UPDATE 08/30/13: Since last visit, had IV steroids, with no improvement in symptoms. No new neuro symptoms. Some left knee pain, after running on treadmill.  UPDATE 05/31/13: Right eye vision problems stable. No change. Still with stiffness in legs, balance diff. No new neurologic symptoms.   PRIOR HPI (04/18/13): 48 year old male here for evaluation of right eye visual problem. End of December 2014 patient woke up with right hip pain and weakness. Around the same time he noticed right eye visual blurring. Patient went to the emergency room and was evaluated. He was given a steroid injection and tablets for the right hip problem. He was given topical medications for his right eye problem. His right hip problem gradually improve. His right eye problem did not. He went to the PCP, eye Dr., and a second eye doctor for this problem. The second eye doctor noted visual field defect in the right eye as well as right optic nerve pallor. Patient referred to me for neurologic evaluation. No symptoms of left eye. No symptoms of left arm and left leg. No slurred speech or trouble talking. No prodromal symptoms or triggering events.  REVIEW OF SYSTEMS: Full 14 system review of systems performed and notable only for blurred vision loss of vision.   ALLERGIES: No Known Allergies  HOME MEDICATIONS: Outpatient Prescriptions Prior to Visit  Medication Sig Dispense Refill  . allopurinol (ZYLOPRIM) 100 MG tablet Take 1 tablet (100 mg total) by mouth daily.  30 tablet  6  . COLCRYS 0.6 MG tablet       . diclofenac (VOLTAREN) 75 MG EC tablet Take 1 tablet (75 mg total) by mouth 2 (two) times daily.  60 tablet  0  .  meloxicam (MOBIC) 15 MG tablet Take 1 tablet (15 mg total) by mouth daily.  30 tablet  5  . naproxen (NAPROSYN) 500 MG tablet TAKE 1 TABLET (500 MG TOTAL) BY MOUTH 2 (TWO) TIMES DAILY WITH A MEAL.  30 tablet  0  . tamsulosin (FLOMAX) 0.4 MG CAPS capsule       . methylPREDNISolone sodium succinate (SOLU-MEDROL) 1,000 mg in sodium chloride 0.9 % 100 mL IVPB        No facility-administered medications prior to visit.    PAST MEDICAL HISTORY: Past Medical History  Diagnosis Date  . Gout     PAST SURGICAL HISTORY: Past Surgical History  Procedure Laterality Date  . Foot surgery Left 1985  . Left hand surgery      FAMILY HISTORY: History reviewed. No pertinent family history.  SOCIAL HISTORY:  History   Social History  . Marital Status: Married    Spouse Name: Ramona    Number of Children: 2  . Years of Education: 12th   Occupational History  .      Public house manager   Social History Main Topics  . Smoking status: Never Smoker   . Smokeless tobacco: Never Used  . Alcohol Use: Yes     Comment: 3 beers daily  . Drug Use: No  . Sexual Activity: Not on file   Other Topics Concern  . Not on file   Social History Narrative   Patient lives at home with  his family.   Caffeine Use: 1 soda daily   Patient is right handed     PHYSICAL EXAM  Filed Vitals:   08/30/13 1159  BP: 140/90  Pulse: 86  Temp: 97.5 F (36.4 C)  TempSrc: Oral  Height: _0  (1.905 m)  Weight: 259 lb 12.8 oz (117.845 kg)    Not recorded    Body mass index is 32.47 kg/(m^2).  GENERAL EXAM: Patient is in no distress; well developed, nourished and groomed; neck is supple  CARDIOVASCULAR: Regular rate and rhythm, no murmurs, no carotid bruits  NEUROLOGIC: MENTAL STATUS: awake, alert, oriented to person, place and time, recent and remote memory intact, normal attention and concentration, language fluent, comprehension intact, naming intact, fund of knowledge appropriate CRANIAL NERVE:  no papilledema on fundoscopic exam, RIGHT RELATIVE AFFERENT PUPILLARY DEFECT, LEFT PUPIL NORMAL, visual fields full to confrontation, RIGHT EYE 20/200, LEFT EYE 20/20, extraocular muscles intact, no nystagmus, facial sensation and strength symmetric, hearing intact, palate elevates symmetrically, uvula midline, shoulder shrug symmetric, tongue midline. MOTOR: normal bulk and tone, full strength in the BUE, BLE SENSORY: normal and symmetric to light touch, pinprick, temperature, vibration COORDINATION: finger-nose-finger, fine finger movements normal REFLEXES: BUE 2, KNEES 1, ANKLES 1 GAIT/STATION: narrow based gait; LIMPS ON LEFT KNEE   DIAGNOSTIC DATA (LABS, IMAGING, TESTING) - I reviewed patient records, labs, notes, testing and imaging myself where available.  Lab Results  Component Value Date   WBC 3.8* 08/11/2012   HGB 12.5* 08/11/2012   HCT 36.5* 04/18/2013   MCV 75.8* 08/11/2012      Component Value Date/Time   NA 135 08/11/2012 1558   K 4.7 08/11/2012 1558   CL 97 08/11/2012 1558   CO2 26 08/11/2012 1558   GLUCOSE 82 08/11/2012 1558   BUN 7 08/11/2012 1558   CREATININE 0.96 08/11/2012 1558   CALCIUM 9.9 08/11/2012 1558   PROT 7.5 08/11/2012 1558   AST 36 08/11/2012 1558   ALT 27 08/11/2012 1558   ALKPHOS 120* 08/11/2012 1558   BILITOT 0.5 08/11/2012 1558   GFRNONAA 94 08/11/2012 1558   GFRAA 109 08/11/2012 1558   Lab Results  Component Value Date   HDL 58 04/18/2013   LDLCALC 160* 04/18/2013   TRIG 126 04/18/2013   CHOLHDL 4.2 04/18/2013   Lab Results  Component Value Date   HGBA1C 5.8* 04/18/2013   Lab Results  Component Value Date   [id number] 063 04/18/2013   Lab Results  Component Value Date   TSH 2.620 04/18/2013    04/04/13 Vision field testing - my review of humphrey matrix shows right eye, right inferior nasal defect; not temporal defect.   I reviewed images myself and disagree with interpretation (reported as normal). I have included my interpretation below:  04/21/13 MRI brain - hazy  periventricular gliosis; few punctate subcortical foci of non-specific gliosis.  04/21/13 MRI orbits - enhancing right optic nerve  04/18/13 Labs - ESR 5, CRP 0.9, TSH 2.620, A1c 5.8, Lipids (CHOL 243, TRIG 126, HDL 58, LDL 160), B12 463, ACE 112, Lyme < 0.91, RPR NR, ANA NR, HIV < 1.00, B1 75.5.   06/21/13 MRI cervical spine (with and without) demonstrating:  1. At C3-4: disc bulging and uncovertebral joint hypertrophy with mild biforaminal stenosis.  2. At C4-5: disc bulging and uncovertebral joint hypertrophy with severe right and moderate-severe left foraminal stenosis.  3. No intrinsic, compressive or abnormal enhancing spinal cord lesions.  06/21/13 MRI thoracic (with and without) - normal  ASSESSMENT AND PLAN  48 y.o. year old male here with sudden right eye vision blurred (Dec 2014). Also with right hip pain/weakness, which has resolved. Exam notable for relative afferent pupillary defect in the right eye. MRI brain/orbits confirm right optic nerve enhancement, with non-specific periventricular gliosis.  Dx: isolated optic neuritis vs ischemic optic neuropathy  PLAN: - monitor symptoms - check NMO antibodies (sample at last visit was rejected) - repeat MRI brain in April 2016  Orders Placed This Encounter  Procedures  . NMO IgG Autoantibodies   Return in about 6 months (around 03/02/2014).    Penni Bombard, MD 3/64/6803, 21:22 PM Certified in Neurology, Neurophysiology and Neuroimaging  [facility] [address] [place], [place] [address] [phone]

## 2013-08-31 LAB — NMO IGG AUTOANTIBODIES: NMO-IgG: <1.5 U/mL (ref 0.0–3.0)

## 2013-09-14 ENCOUNTER — Telehealth: Payer: Self-pay | Admitting: Family Medicine

## 2013-09-14 NOTE — Telephone Encounter (Signed)
appt made

## 2013-09-15 ENCOUNTER — Ambulatory Visit (INDEPENDENT_AMBULATORY_CARE_PROVIDER_SITE_OTHER): Payer: 59 | Admitting: Family Medicine

## 2013-09-15 ENCOUNTER — Encounter: Payer: Self-pay | Admitting: Family Medicine

## 2013-09-15 VITALS — BP 150/94 | HR 90 | Temp 98.9°F | Ht 75.0 in | Wt 253.0 lb

## 2013-09-15 DIAGNOSIS — R739 Hyperglycemia, unspecified: Secondary | ICD-10-CM

## 2013-09-15 DIAGNOSIS — R7309 Other abnormal glucose: Secondary | ICD-10-CM

## 2013-09-15 DIAGNOSIS — F5221 Male erectile disorder: Secondary | ICD-10-CM

## 2013-09-15 DIAGNOSIS — F528 Other sexual dysfunction not due to a substance or known physiological condition: Secondary | ICD-10-CM

## 2013-09-15 LAB — POCT CBC
Granulocyte percent: 70.7 %G (ref 37–80)
HCT, POC: 39.3 % — AB (ref 43.5–53.7)
Hemoglobin: 12.8 g/dL — AB (ref 14.1–18.1)
Lymph, poc: 1.2 (ref 0.6–3.4)
MCH, POC: 25.5 pg — AB (ref 27–31.2)
MCHC: 32.7 g/dL (ref 31.8–35.4)
MCV: 78 fL — AB (ref 80–97)
MPV: 6.8 fL (ref 0–99.8)
POC Granulocyte: 3.1 (ref 2–6.9)
POC LYMPH PERCENT: 26.9 %L (ref 10–50)
Platelet Count, POC: 296 10*3/uL (ref 142–424)
RBC: 5 M/uL (ref 4.69–6.13)
RDW, POC: 13.6 %
WBC: 4.4 10*3/uL — AB (ref 4.6–10.2)

## 2013-09-15 LAB — POCT GLYCOSYLATED HEMOGLOBIN (HGB A1C): Hemoglobin A1C: 5.3

## 2013-09-15 LAB — GLUCOSE, POCT (MANUAL RESULT ENTRY): POC Glucose: 98 mg/dl (ref 70–99)

## 2013-09-15 NOTE — Progress Notes (Signed)
   Subjective:    Patient ID: Luke Davila, male    DOB: 03-Mar-1965, 48 y.o.   MRN: [id number]  HPI  Patient has hx of hyperglycemia and in 4/15 his hgbaic was 5.8%.  He denies any diabetes sx's. He has ED and wants to have testosterone checked.  Review of Systems No chest pain, SOB, HA, dizziness, vision change, N/V, diarrhea, constipation, dysuria, urinary urgency or frequency, myalgias, arthralgias or rash.     Objective:   Physical Exam  Vital signs noted  Well developed well nourished male.  HEENT - Head atraumatic Normocephalic                Eyes - PERRLA, Conjuctiva - clear Sclera- Clear EOMI                Ears - EAC's Wnl TM's Wnl Gross Hearing WNL                Nose - Nares patent                 Throat - oropharanx wnl Respiratory - Lungs CTA bilateral Cardiac - RRR S1 and S2 without murmur GI - Abdomen soft Nontender and bowel sounds active x 4 Extremities - No edema. Neuro - Grossly intact.      Assessment & Plan:  Hyperglycemia - Plan: Testosterone,Free and Total  ED (erectile dysfunction) of non-organic origin - Plan: POCT glucose (manual entry), POCT CBC, POCT glycosylated hemoglobin (Hb A1C), PSA, total and free  Deatra Canter FNP

## 2013-09-16 LAB — TESTOSTERONE,FREE AND TOTAL
Testosterone, Free: 2 pg/mL — ABNORMAL LOW (ref 6.8–21.5)
Testosterone: 104 ng/dL — ABNORMAL LOW (ref 348–1197)

## 2013-09-16 LAB — PSA: PSA: 0.4 ng/mL (ref 0.0–4.0)

## 2013-09-20 ENCOUNTER — Telehealth: Payer: Self-pay | Admitting: Family Medicine

## 2013-09-21 NOTE — Telephone Encounter (Signed)
Patient calling for lab results can you please review and I will be glad to call patient

## 2013-09-22 ENCOUNTER — Other Ambulatory Visit: Payer: Self-pay | Admitting: Family Medicine

## 2013-09-22 DIAGNOSIS — E291 Testicular hypofunction: Secondary | ICD-10-CM

## 2013-09-22 MED ORDER — TESTOSTERONE CYPIONATE 200 MG/ML IM SOLN: 200.0000 mg | INTRAMUSCULAR | Status: DC

## 2013-09-22 NOTE — Telephone Encounter (Signed)
Patient aware.

## 2013-09-29 DIAGNOSIS — H47291 Other optic atrophy, right eye: Secondary | ICD-10-CM | POA: Insufficient documentation

## 2013-09-29 DIAGNOSIS — H5340 Unspecified visual field defects: Secondary | ICD-10-CM | POA: Insufficient documentation

## 2013-10-11 ENCOUNTER — Ambulatory Visit (INDEPENDENT_AMBULATORY_CARE_PROVIDER_SITE_OTHER): Payer: 59 | Admitting: *Deleted

## 2013-10-11 DIAGNOSIS — E349 Endocrine disorder, unspecified: Secondary | ICD-10-CM

## 2013-10-11 DIAGNOSIS — E291 Testicular hypofunction: Secondary | ICD-10-CM

## 2013-10-11 MED ORDER — TESTOSTERONE CYPIONATE 200 MG/ML IM SOLN
200.0000 mg | INTRAMUSCULAR | Status: AC
  Administered 2013-10-11 – 2013-11-22 (×4): 200 mg via INTRAMUSCULAR

## 2013-10-11 NOTE — Progress Notes (Signed)
Patient tolerated well.

## 2013-10-11 NOTE — Patient Instructions (Signed)

## 2013-10-24 ENCOUNTER — Ambulatory Visit: Payer: 59

## 2013-10-25 ENCOUNTER — Ambulatory Visit (INDEPENDENT_AMBULATORY_CARE_PROVIDER_SITE_OTHER): Payer: 59 | Admitting: *Deleted

## 2013-10-25 DIAGNOSIS — E349 Endocrine disorder, unspecified: Secondary | ICD-10-CM

## 2013-10-25 DIAGNOSIS — E291 Testicular hypofunction: Secondary | ICD-10-CM

## 2013-11-08 ENCOUNTER — Ambulatory Visit (INDEPENDENT_AMBULATORY_CARE_PROVIDER_SITE_OTHER): Payer: 59 | Admitting: *Deleted

## 2013-11-08 DIAGNOSIS — E349 Endocrine disorder, unspecified: Secondary | ICD-10-CM

## 2013-11-08 DIAGNOSIS — E291 Testicular hypofunction: Secondary | ICD-10-CM

## 2013-11-21 ENCOUNTER — Telehealth: Payer: Self-pay | Admitting: Family Medicine

## 2013-11-21 NOTE — Telephone Encounter (Signed)
[facility] 2:[facility]

## 2013-11-22 ENCOUNTER — Ambulatory Visit (INDEPENDENT_AMBULATORY_CARE_PROVIDER_SITE_OTHER): Payer: 59 | Admitting: Family Medicine

## 2013-11-22 ENCOUNTER — Encounter: Payer: Self-pay | Admitting: Family Medicine

## 2013-11-22 VITALS — BP 131/81 | HR 79 | Temp 98.1°F | Ht 75.0 in | Wt 255.0 lb

## 2013-11-22 DIAGNOSIS — E291 Testicular hypofunction: Secondary | ICD-10-CM

## 2013-11-22 DIAGNOSIS — M25562 Pain in left knee: Secondary | ICD-10-CM

## 2013-11-22 MED ORDER — MELOXICAM 15 MG PO TABS: 15.0000 mg | ORAL_TABLET | Freq: Every day | ORAL | Status: DC

## 2013-11-22 MED ORDER — TESTOSTERONE 20.25 MG/ACT (1.62%) TD GEL: 4.0000 "application " | TRANSDERMAL | Status: DC

## 2013-11-22 NOTE — Progress Notes (Signed)
   Subjective:    Patient ID: Luke Davila, male    DOB: 1965/10/21, 48 y.o.   MRN: [id number]  HPI Patient c/o rash one lower extremities.  He has arthritis sx's.  He has been taking testosterone injections and he wants to change to androgel.     Review of Systems  Constitutional: Negative for fever.  HENT: Negative for ear pain.   Eyes: Negative for discharge.  Respiratory: Negative for cough.   Cardiovascular: Negative for chest pain.  Gastrointestinal: Negative for abdominal distention.  Endocrine: Negative for polyuria.  Genitourinary: Negative for difficulty urinating.  Musculoskeletal: Negative for gait problem and neck pain.  Skin: Negative for color change and rash.  Neurological: Negative for speech difficulty and headaches.  Psychiatric/Behavioral: Negative for agitation.       Objective:    BP 131/81 mmHg  Pulse 79  Temp(Src) 98.1 F (36.7 C) (Oral)  Ht 6\' 3"  (1.905 m)  Wt 255 lb (115.667 kg)  BMI 31.87 kg/m2 Physical Exam  Constitutional: He is oriented to person, place, and time. He appears well-developed and well-nourished.  HENT:  Head: Normocephalic and atraumatic.  Mouth/Throat: Oropharynx is clear and moist.  Eyes: Pupils are equal, round, and reactive to light.  Neck: Normal range of motion. Neck supple.  Cardiovascular: Normal rate and regular rhythm.   No murmur heard. Pulmonary/Chest: Effort normal and breath sounds normal.  Abdominal: Soft. Bowel sounds are normal. There is no tenderness.  Neurological: He is alert and oriented to person, place, and time.  Skin: Skin is warm and dry.  Bilateral lower extremities with venous stasis dermatitis  Psychiatric: He has a normal mood and affect.          Assessment & Plan:     ICD-9-CM ICD-10-CM   1. Left knee pain 719.46 M25.562 meloxicam (MOBIC) 15 MG tablet   Hypogonadism - Androgel 4 pumps a day and follow up in 2 months for labs cmp, cbc, testosterone, psa  Venous stasis dermatitis -  Reassured this is not diabetes.  Follow up in 3 months and prn  Return in about 3 months (around 02/22/2014), or if symptoms worsen or fail to improve.  Deatra CanterWilliam J Reign Bartnick FNP

## 2013-12-06 ENCOUNTER — Telehealth: Payer: Self-pay | Admitting: Family Medicine

## 2013-12-06 ENCOUNTER — Other Ambulatory Visit: Payer: Self-pay | Admitting: Family Medicine

## 2013-12-06 MED ORDER — TESTOSTERONE 50 MG/5GM (1%) TD GEL: 5.0000 g | Freq: Every day | TRANSDERMAL | Status: DC

## 2013-12-06 NOTE — Telephone Encounter (Signed)
Pt aware.

## 2013-12-06 NOTE — Telephone Encounter (Signed)
Come in and pick up new testosterone rx

## 2013-12-07 ENCOUNTER — Other Ambulatory Visit: Payer: Self-pay | Admitting: *Deleted

## 2013-12-07 MED ORDER — TESTOSTERONE 5 MG/24HR TD PT24: 1.0000 | MEDICATED_PATCH | Freq: Every day | TRANSDERMAL | Status: DC

## 2013-12-07 NOTE — Progress Notes (Signed)
Ins won't cover Androgel Rx changed to Testim

## 2013-12-08 ENCOUNTER — Telehealth: Payer: Self-pay | Admitting: Family Medicine

## 2013-12-08 NOTE — Telephone Encounter (Signed)
Spoke with [facility] to inform 4 mg

## 2014-01-02 ENCOUNTER — Encounter: Payer: Self-pay | Admitting: Diagnostic Neuroimaging

## 2014-02-08 ENCOUNTER — Ambulatory Visit (INDEPENDENT_AMBULATORY_CARE_PROVIDER_SITE_OTHER): Payer: 59 | Admitting: Family Medicine

## 2014-02-08 ENCOUNTER — Other Ambulatory Visit: Payer: Self-pay | Admitting: Family Medicine

## 2014-02-08 VITALS — BP 173/125 | HR 91 | Temp 98.1°F | Wt 263.2 lb

## 2014-02-08 DIAGNOSIS — S9032XA Contusion of left foot, initial encounter: Secondary | ICD-10-CM

## 2014-02-08 NOTE — Progress Notes (Signed)
   Subjective:    Patient ID: Luke Davila, male    DOB: 1965/02/27, 49 y.o.   MRN: [id number]  HPI Patient is here for left foot pain after stepping on a light bulb.  Review of Systems  Constitutional: Negative for fever.  HENT: Negative for ear pain.   Eyes: Negative for discharge.  Respiratory: Negative for cough.   Cardiovascular: Negative for chest pain.  Gastrointestinal: Negative for abdominal distention.  Endocrine: Negative for polyuria.  Genitourinary: Negative for difficulty urinating.  Musculoskeletal: Negative for gait problem and neck pain.  Skin: Negative for color change and rash.  Neurological: Negative for speech difficulty and headaches.  Psychiatric/Behavioral: Negative for agitation.       Objective:    BP 173/125 mmHg  Pulse 91  Temp(Src) 98.1 F (36.7 C) (Oral)  Wt 263 lb 3.2 oz (119.387 kg) Physical Exam  Constitutional: He is oriented to person, place, and time. He appears well-developed and well-nourished.  HENT:  Head: Normocephalic and atraumatic.  Mouth/Throat: Oropharynx is clear and moist.  Eyes: Pupils are equal, round, and reactive to light.  Neck: Normal range of motion. Neck supple.  Cardiovascular: Normal rate and regular rhythm.   No murmur heard. Pulmonary/Chest: Effort normal and breath sounds normal.  Abdominal: Soft. Bowel sounds are normal. There is no tenderness.  Neurological: He is alert and oriented to person, place, and time.  Skin: Skin is warm and dry.  No mass or glass palpated to area of concern proximal left 5th metatarsal and plantar surface. Bruising but no interruption in integument.  Psychiatric: He has a normal mood and affect.          Assessment & Plan:     ICD-9-CM ICD-10-CM   1. Bruised sole of foot, left, initial encounter 924.20 S90.32XA    Reassurance that no FB is palpated.  No Follow-up on file.  Deatra CanterWilliam J Oxford FNP

## 2014-03-01 ENCOUNTER — Ambulatory Visit (INDEPENDENT_AMBULATORY_CARE_PROVIDER_SITE_OTHER): Payer: 59 | Admitting: Family Medicine

## 2014-03-01 ENCOUNTER — Encounter: Payer: Self-pay | Admitting: Family Medicine

## 2014-03-01 VITALS — BP 148/93 | HR 89 | Temp 98.7°F | Ht 75.0 in | Wt 258.0 lb

## 2014-03-01 DIAGNOSIS — M25572 Pain in left ankle and joints of left foot: Secondary | ICD-10-CM

## 2014-03-01 DIAGNOSIS — I10 Essential (primary) hypertension: Secondary | ICD-10-CM | POA: Diagnosis not present

## 2014-03-01 NOTE — Patient Instructions (Signed)
DASH Eating Plan °DASH stands for "Dietary Approaches to Stop Hypertension." The DASH eating plan is a healthy eating plan that has been shown to reduce high blood pressure (hypertension). Additional health benefits may include reducing the risk of type 2 diabetes mellitus, heart disease, and stroke. The DASH eating plan may also help with weight loss. °WHAT DO I NEED TO KNOW ABOUT THE DASH EATING PLAN? °For the DASH eating plan, you will follow these general guidelines: °· Choose foods with a percent daily value for sodium of less than 5% (as listed on the food label). °· Use salt-free seasonings or herbs instead of table salt or sea salt. °· Check with your health care provider or pharmacist before using salt substitutes. °· Eat lower-sodium products, often labeled as "lower sodium" or "no salt added." °· Eat fresh foods. °· Eat more vegetables, fruits, and low-fat dairy products. °· Choose whole grains. Look for the word "whole" as the first word in the ingredient list. °· Choose fish and skinless chicken or turkey more often than red meat. Limit fish, poultry, and meat to 6 oz (170 g) each day. °· Limit sweets, desserts, sugars, and sugary drinks. °· Choose heart-healthy fats. °· Limit cheese to 1 oz (28 g) per day. °· Eat more home-cooked food and less restaurant, buffet, and fast food. °· Limit fried foods. °· Cook foods using methods other than frying. °· Limit canned vegetables. If you do use them, rinse them well to decrease the sodium. °· When eating at a restaurant, ask that your food be prepared with less salt, or no salt if possible. °WHAT FOODS CAN I EAT? °Seek help from a dietitian for individual calorie needs. °Grains °Whole grain or whole wheat bread. Brown rice. Whole grain or whole wheat pasta. Quinoa, bulgur, and whole grain cereals. Low-sodium cereals. Corn or whole wheat flour tortillas. Whole grain cornbread. Whole grain crackers. Low-sodium crackers. °Vegetables °Fresh or frozen vegetables  (raw, steamed, roasted, or grilled). Low-sodium or reduced-sodium tomato and vegetable juices. Low-sodium or reduced-sodium tomato sauce and paste. Low-sodium or reduced-sodium canned vegetables.  °Fruits °All fresh, canned (in natural juice), or frozen fruits. °Meat and Other Protein Products °Ground beef (85% or leaner), grass-fed beef, or beef trimmed of fat. Skinless chicken or turkey. Ground chicken or turkey. Pork trimmed of fat. All fish and seafood. Eggs. Dried beans, peas, or lentils. Unsalted nuts and seeds. Unsalted canned beans. °Dairy °Low-fat dairy products, such as skim or 1% milk, 2% or reduced-fat cheeses, low-fat ricotta or cottage cheese, or plain low-fat yogurt. Low-sodium or reduced-sodium cheeses. °Fats and Oils °Tub margarines without trans fats. Light or reduced-fat mayonnaise and salad dressings (reduced sodium). Avocado. Safflower, olive, or canola oils. Natural peanut or almond butter. °Other °Unsalted popcorn and pretzels. °The items listed above may not be a complete list of recommended foods or beverages. Contact your dietitian for more options. °WHAT FOODS ARE NOT RECOMMENDED? °Grains °White bread. White pasta. White rice. Refined cornbread. Bagels and croissants. Crackers that contain trans fat. °Vegetables °Creamed or fried vegetables. Vegetables in a cheese sauce. Regular canned vegetables. Regular canned tomato sauce and paste. Regular tomato and vegetable juices. °Fruits °Dried fruits. Canned fruit in light or heavy syrup. Fruit juice. °Meat and Other Protein Products °Fatty cuts of meat. Ribs, chicken wings, bacon, sausage, bologna, salami, chitterlings, fatback, hot dogs, bratwurst, and packaged luncheon meats. Salted nuts and seeds. Canned beans with salt. °Dairy °Whole or 2% milk, cream, half-and-half, and cream cheese. Whole-fat or sweetened yogurt. Full-fat   cheeses or blue cheese. Nondairy creamers and whipped toppings. Processed cheese, cheese spreads, or cheese  curds. °Condiments °Onion and garlic salt, seasoned salt, table salt, and sea salt. Canned and packaged gravies. Worcestershire sauce. Tartar sauce. Barbecue sauce. Teriyaki sauce. Soy sauce, including reduced sodium. Steak sauce. Fish sauce. Oyster sauce. Cocktail sauce. Horseradish. Ketchup and mustard. Meat flavorings and tenderizers. Bouillon cubes. Hot sauce. Tabasco sauce. Marinades. Taco seasonings. Relishes. °Fats and Oils °Butter, stick margarine, lard, shortening, ghee, and bacon fat. Coconut, palm kernel, or palm oils. Regular salad dressings. °Other °Pickles and olives. Salted popcorn and pretzels. °The items listed above may not be a complete list of foods and beverages to avoid. Contact your dietitian for more information. °WHERE CAN I FIND MORE INFORMATION? °National Heart, Lung, and [employer] Institute: www.nhlbi.nih.gov/health/health-topics/topics/dash/ °Document Released: 12/12/2010 Document Revised: 05/09/2013 Document Reviewed: 10/27/2012 °ExitCare® Patient Information ©2015 [employer]. This information is not intended to replace advice given to you by your health care provider. Make sure you discuss any questions you have with your health care provider. ° °

## 2014-03-02 ENCOUNTER — Telehealth: Payer: Self-pay | Admitting: *Deleted

## 2014-03-02 ENCOUNTER — Ambulatory Visit: Payer: Self-pay | Admitting: Diagnostic Neuroimaging

## 2014-03-02 NOTE — Telephone Encounter (Signed)
Patient called stating that he does not want to be r/s for another appt unless the physician can tell him why he can not see out his eye. Patient states that he needs to be seen by an eye doctor or by someone that can help him.

## 2014-03-03 ENCOUNTER — Encounter: Payer: Self-pay | Admitting: Diagnostic Neuroimaging

## 2014-03-05 NOTE — Progress Notes (Signed)
Subjective:  Patient ID: Luke Davila, male    DOB: 03-05-1965  Age: 49 y.o. MRN: [id number]  CC: Foot Injury   HPI Banks Chaikin presents for acute onset left foot pain. He continues to use the allopurinol regularly has not been using the Colcrys recently.    History Yadier has a past medical history of Gout.   He has past surgical history that includes Foot surgery (Left, 1985) and Left hand surgery.   His family history is not on file.He reports that he has never smoked. He has never used smokeless tobacco. He reports that he drinks alcohol. He reports that he does not use illicit drugs.  Current Outpatient Prescriptions on File Prior to Visit  Medication Sig Dispense Refill  . allopurinol (ZYLOPRIM) 100 MG tablet Take 1 tablet (100 mg total) by mouth daily. 30 tablet 6  . COLCRYS 0.6 MG tablet     . diclofenac (VOLTAREN) 75 MG EC tablet Take 1 tablet (75 mg total) by mouth 2 (two) times daily. 60 tablet 0  . meloxicam (MOBIC) 15 MG tablet Take 1 tablet (15 mg total) by mouth daily. 30 tablet 5  . naproxen (NAPROSYN) 500 MG tablet TAKE 1 TABLET (500 MG TOTAL) BY MOUTH 2 (TWO) TIMES DAILY WITH A MEAL. 30 tablet 0  . tamsulosin (FLOMAX) 0.4 MG CAPS capsule     . testosterone (ANDRODERM) 5 MG/24HR Place 1 patch onto the skin daily. 30 patch 1  . testosterone cypionate (DEPO-TESTOSTERONE) 200 MG/ML injection Inject 1 mL (200 mg total) into the muscle every 14 (fourteen) days. 10 mL 3   No current facility-administered medications on file prior to visit.    ROS Review of Systems  Constitutional: Negative for fever, chills, diaphoresis and unexpected weight change.  HENT: Negative for congestion, hearing loss, rhinorrhea, sore throat and trouble swallowing.   Respiratory: Negative for cough, chest tightness, shortness of breath and wheezing.   Gastrointestinal: Negative for nausea, vomiting, abdominal pain, diarrhea, constipation and abdominal distention.  Endocrine: Negative  for cold intolerance and heat intolerance.  Genitourinary: Negative for dysuria, hematuria and flank pain.  Musculoskeletal: Negative for joint swelling and arthralgias.  Skin: Negative for rash.  Neurological: Negative for dizziness and headaches.  Psychiatric/Behavioral: Negative for dysphoric mood, decreased concentration and agitation. The patient is not nervous/anxious.     Objective:  BP 148/93 mmHg  Pulse 89  Temp(Src) 98.7 F (37.1 C) (Oral)  Ht  (1.905 m)  Wt 258 lb (117.028 kg)  BMI 32.25 kg/m2  BP Readings from Last 3 Encounters:  03/01/14 148/93  02/08/14 173/125  11/22/13 131/81    Wt Readings from Last 3 Encounters:  03/01/14 258 lb (117.028 kg)  02/08/14 263 lb 3.2 oz (119.387 kg)  11/22/13 255 lb (115.667 kg)     Physical Exam  Constitutional: He is oriented to person, place, and time. He appears well-developed and well-nourished. No distress.  HENT:  Head: Normocephalic and atraumatic.  Right Ear: External ear normal.  Left Ear: External ear normal.  Nose: Nose normal.  Mouth/Throat: Oropharynx is clear and moist.  Eyes: Conjunctivae and EOM are normal. Pupils are equal, round, and reactive to light.  Neck: Normal range of motion. Neck supple. No thyromegaly present.  Cardiovascular: Normal rate, regular rhythm and normal heart sounds.   No murmur heard. Pulmonary/Chest: Effort normal and breath sounds normal. No respiratory distress. He has no wheezes. He has no rales.  Abdominal: Soft. Bowel sounds are normal. He exhibits no distension.  There is no tenderness.  Musculoskeletal: He exhibits tenderness (base of heel at the plantar fascial insertion).  Lymphadenopathy:    He has no cervical adenopathy.  Neurological: He is alert and oriented to person, place, and time. He has normal reflexes.  Skin: Skin is warm and dry.  Psychiatric: He has a normal mood and affect. His behavior is normal. Judgment and thought content normal.    Lab Results    Component Value Date   HGBA1C 5.3% 09/15/2013   HGBA1C 5.8* 04/18/2013    Lab Results  Component Value Date   WBC 4.4* 09/15/2013   HGB 12.8* 09/15/2013   HCT 39.3* 09/15/2013   GLUCOSE 82 08/11/2012   CHOL 243* 04/18/2013   TRIG 126 04/18/2013   HDL 58 04/18/2013   LDLCALC 160* 04/18/2013   ALT 27 08/11/2012   AST 36 08/11/2012   NA 135 08/11/2012   K 4.7 08/11/2012   CL 97 08/11/2012   CREATININE 0.96 08/11/2012   BUN 7 08/11/2012   CO2 26 08/11/2012   TSH 2.620 04/18/2013   PSA 0.4 09/15/2013   HGBA1C 5.3% 09/15/2013    Mr Cervical Spine W Wo Contrast  06/23/2013   [facility]  NEUROIMAGING REPORT   STUDY DATE: 06/21/13 PATIENT NAME: Luke Davila DOB: 16-Mar-1965 MRN: [id number]  ORDERING CLINICIAN: Joycelyn Schmid, MD  CLINICAL HISTORY: 49 year old male with right optic neuritis.  EXAM: MRI cervical spine (with and without)  TECHNIQUE: MRI of the cervical spine was obtained utilizing 3 mm sagittal  slices from the posterior fossa down to the T3-4 level with T1, T2 and  inversion recovery views. In addition 4 mm axial slices from C2-3 down to  T1-2 level were included with T2 and gradient echo views. CONTRAST: 20ml multihance  IMAGING SITE: [facility] [address] (1.5 Tesla MRI)    FINDINGS:  On sagittal views the vertebral bodies have normal height and alignment.  Mild disc bulging at C4-5. The spinal cord is normal in size and  appearance. The posterior fossa, pituitary gland and paraspinal soft  tissues are unremarkable.    On axial views: C2-3: no spinal stenosis or foraminal narrowing  C3-4: disc bulging and uncovertebral joint hypertrophy with mild  biforaminal stenosis  C4-5: disc bulging and uncovertebral joint hypertrophy with severe right  and moderate-severe left foraminal stenosis  C5-6: no spinal stenosis or foraminal narrowing C6-7: no spinal stenosis or foraminal narrowing  C7-T1: no spinal stenosis or foraminal narrowing   Limited  views of the soft tissues of the head and neck are unremarkable.  No abnormal enhancing lesions.    06/23/2013   Mildly abnormal MRI cervical spine (with and without) demonstrating: 1. At C3-4: disc bulging and uncovertebral joint hypertrophy with mild  biforaminal stenosis. 2. At C4-5: disc bulging and uncovertebral joint hypertrophy with severe  right and moderate-severe left foraminal stenosis. 3. No intrinsic, compressive or abnormal enhancing spinal cord lesions.   INTERPRETING PHYSICIAN:  Suanne Marker, MD Certified in Neurology, Neurophysiology and Neuroimaging  [facility] [address] [place], [place] [address] [phone]   Mr Thoracic Spine Vivien Rossetti Contrast  06/23/2013   [facility]  NEUROIMAGING REPORT   STUDY DATE: 06/21/13 PATIENT NAME: Colton Tassin DOB: [birth year] MRN: [id number]  ORDERING CLINICIAN: Joycelyn Schmid, MD  CLINICAL HISTORY: 49 year old male with right optic neuritis.  EXAM: MRI thoracic spine (with and without)  TECHNIQUE: MRI of the thoracic spine was obtained  utilizing 3 mm sagittal  slices from C4-5 down to the L1-2 level with T1, T2 and inversion recovery  views. In addition 4 mm axial slices from C7-T1 down to T12-L1 level were  included with T1, T2 and gradient echo views.   CONTRAST: 20ml multihance  IMAGING SITE: [facility] [address] (1.5 Tesla MRI)    FINDINGS:  On sagittal views the vertebral bodies have normal height and alignment.   The spinal cord is normal in size and appearance. The paraspinal soft  tissues are unremarkable.    On axial views there is no spinal stenosis or foraminal narrowing.   Limited views of the aorta, kidneys, liver, lungs and paraspinal muscles  are unremarkable. No abnormal enhancing lesions.    06/23/2013   Normal MRI thoracic spine (with and without).   INTERPRETING PHYSICIAN:  Suanne MarkerVIKRAM R. PENUMALLI, MD Certified in Neurology, Neurophysiology and Neuroimaging  [facility] [address], [address] [place], [place] [address] [phone]    Assessment & Plan:   Remi DeterSamuel was seen today for foot injury.  Diagnoses and all orders for this visit:  Pain in joint, ankle and foot, left  Essential hypertension  I am having Mr. Albesa SeenCentry maintain his allopurinol, COLCRYS, naproxen, tamsulosin, diclofenac, testosterone cypionate, meloxicam, and testosterone.  No orders of the defined types were placed in this encounter.    Follow-up: No Follow-up on file.  Mechele ClaudeWarren Cissy Galbreath, M.D.

## 2014-03-10 ENCOUNTER — Telehealth: Payer: Self-pay | Admitting: *Deleted

## 2014-03-10 NOTE — Telephone Encounter (Signed)
Spoke with the wife on the phone who took a message for me. I asked her to have him call me back on Monday because I want to talk with him about why he needs to get his MRI and why he needs to follow-up in the office regarding his vision loss. She said she would have him give me a call back.

## 2014-06-20 ENCOUNTER — Ambulatory Visit (INDEPENDENT_AMBULATORY_CARE_PROVIDER_SITE_OTHER): Payer: 59 | Admitting: Family Medicine

## 2014-06-20 ENCOUNTER — Encounter: Payer: Self-pay | Admitting: Family Medicine

## 2014-06-20 ENCOUNTER — Ambulatory Visit (INDEPENDENT_AMBULATORY_CARE_PROVIDER_SITE_OTHER): Payer: 59

## 2014-06-20 VITALS — BP 127/83 | HR 86 | Temp 96.2°F | Ht 75.0 in | Wt 243.0 lb

## 2014-06-20 DIAGNOSIS — I878 Other specified disorders of veins: Secondary | ICD-10-CM | POA: Diagnosis not present

## 2014-06-20 DIAGNOSIS — R2231 Localized swelling, mass and lump, right upper limb: Secondary | ICD-10-CM

## 2014-06-20 DIAGNOSIS — L309 Dermatitis, unspecified: Secondary | ICD-10-CM | POA: Diagnosis not present

## 2014-06-20 DIAGNOSIS — R7309 Other abnormal glucose: Secondary | ICD-10-CM

## 2014-06-20 DIAGNOSIS — R7303 Prediabetes: Secondary | ICD-10-CM

## 2014-06-20 LAB — POCT CBC
Granulocyte percent: 56.8 %G (ref 37–80)
HEMATOCRIT: 39.3 % — AB (ref 43.5–53.7)
HEMOGLOBIN: 12.2 g/dL — AB (ref 14.1–18.1)
Lymph, poc: 1.1 (ref 0.6–3.4)
MCH: 24.3 pg — AB (ref 27–31.2)
MCHC: 31 g/dL — AB (ref 31.8–35.4)
MCV: 78.2 fL — AB (ref 80–97)
MPV: 6.4 fL (ref 0–99.8)
POC Granulocyte: 1.9 — AB (ref 2–6.9)
POC LYMPH PERCENT: 33.4 %L (ref 10–50)
Platelet Count, POC: 300 10*3/uL (ref 142–424)
RBC: 5.02 M/uL (ref 4.69–6.13)
RDW, POC: 13.2 %
WBC: 3.4 10*3/uL — AB (ref 4.6–10.2)

## 2014-06-20 LAB — POCT GLYCOSYLATED HEMOGLOBIN (HGB A1C): Hemoglobin A1C: 5.5

## 2014-06-20 MED ORDER — FLUOCINONIDE-E 0.05 % EX CREA: 1.0000 "application " | TOPICAL_CREAM | Freq: Two times a day (BID) | CUTANEOUS | Status: DC

## 2014-06-20 NOTE — Progress Notes (Signed)
Subjective:  Patient ID: Luke Davila, male    DOB: 11-19-1965  Age: 49 y.o. MRN: [id number]  CC: Rash   HPI Luke Davila presents for concern for discoloration of the lower legs just above the ankles to the bulge of the calves. This is gotten dark bilaterally with the left being worse than the right. There has been a trace of edema but no large amount. He is concerned about the possibility of diabetes as he was told in the past his sugar was a bit high. There is also a nodule on the right hand at the base of the fifth finger he is concerned that this may represent a tumor.    History Luke Davila has a past medical history of Gout.   He has past surgical history that includes Foot surgery (Left, 1985) and Left hand surgery.   His family history is not on file.He reports that he has never smoked. He has never used smokeless tobacco. He reports that he drinks alcohol. He reports that he does not use illicit drugs.  Outpatient Prescriptions Prior to Visit  Medication Sig Dispense Refill  . meloxicam (MOBIC) 15 MG tablet Take 1 tablet (15 mg total) by mouth daily. 30 tablet 5  . tamsulosin (FLOMAX) 0.4 MG CAPS capsule     . testosterone (ANDRODERM) 5 MG/24HR Place 1 patch onto the skin daily. (Patient not taking: Reported on 06/20/2014) 30 patch 1  . testosterone cypionate (DEPO-TESTOSTERONE) 200 MG/ML injection Inject 1 mL (200 mg total) into the muscle every 14 (fourteen) days. (Patient not taking: Reported on 06/20/2014) 10 mL 3  . allopurinol (ZYLOPRIM) 100 MG tablet Take 1 tablet (100 mg total) by mouth daily. (Patient not taking: Reported on 06/20/2014) 30 tablet 6  . COLCRYS 0.6 MG tablet     . diclofenac (VOLTAREN) 75 MG EC tablet Take 1 tablet (75 mg total) by mouth 2 (two) times daily. (Patient not taking: Reported on 06/20/2014) 60 tablet 0  . naproxen (NAPROSYN) 500 MG tablet TAKE 1 TABLET (500 MG TOTAL) BY MOUTH 2 (TWO) TIMES DAILY WITH A MEAL. (Patient not taking: Reported on  06/20/2014) 30 tablet 0   No facility-administered medications prior to visit.    ROS Review of Systems  Constitutional: Negative for fever, chills and diaphoresis.  HENT: Negative for congestion, rhinorrhea and sore throat.   Respiratory: Negative for cough, shortness of breath and wheezing.   Cardiovascular: Negative for chest pain.  Gastrointestinal: Negative for nausea, vomiting, abdominal pain, diarrhea, constipation and abdominal distention.  Genitourinary: Negative for dysuria and frequency.  Musculoskeletal: Positive for joint swelling. Negative for arthralgias.  Skin: Positive for color change. Negative for wound.  Neurological: Negative for headaches.    Objective:  BP 127/83 mmHg  Pulse 86  Temp(Src) 96.2 F (35.7 C) (Oral)  Ht '6\' 3"'  (1.905 m)  Wt 243 lb (110.224 kg)  BMI 30.37 kg/m2  BP Readings from Last 3 Encounters:  06/20/14 127/83  03/01/14 148/93  02/08/14 173/125    Wt Readings from Last 3 Encounters:  06/20/14 243 lb (110.224 kg)  03/01/14 258 lb (117.028 kg)  02/08/14 263 lb 3.2 oz (119.387 kg)     Physical Exam  Constitutional: He is oriented to person, place, and time. He appears well-developed and well-nourished. No distress.  HENT:  Head: Normocephalic and atraumatic.  Right Ear: External ear normal.  Left Ear: External ear normal.  Nose: Nose normal.  Mouth/Throat: Oropharynx is clear and moist.  Eyes: Conjunctivae and EOM are  normal. Pupils are equal, round, and reactive to light.  Neck: Normal range of motion. Neck supple. No thyromegaly present.  Cardiovascular: Normal rate, regular rhythm and normal heart sounds.   No murmur heard. Pulmonary/Chest: Effort normal and breath sounds normal. No respiratory distress. He has no wheezes. He has no rales.  Abdominal: Soft. Bowel sounds are normal. He exhibits no distension. There is no tenderness.  Musculoskeletal: Normal range of motion. He exhibits edema (minimal with typical stasis  darkening of the distal lower legs bilaterally).  There is a nodule at the base of the right fifth finger that is 4 x 8 mm hard minimally mobile and nontender. It is not discolored. It is raised.  Lymphadenopathy:    He has no cervical adenopathy.  Neurological: He is alert and oriented to person, place, and time. He has normal reflexes.  Skin: Skin is warm and dry.  Psychiatric: He has a normal mood and affect. His behavior is normal. Judgment and thought content normal.    Lab Results  Component Value Date   HGBA1C 5.5 06/20/2014   HGBA1C 5.3% 09/15/2013   HGBA1C 5.8* 04/18/2013    Lab Results  Component Value Date   WBC 3.4* 06/20/2014   HGB 12.2* 06/20/2014   HCT 39.3* 06/20/2014   GLUCOSE 81 06/20/2014   CHOL 243* 04/18/2013   TRIG 126 04/18/2013   HDL 58 04/18/2013   LDLCALC 160* 04/18/2013   ALT 25 06/20/2014   AST 51* 06/20/2014   NA 134 06/20/2014   K 4.1 06/20/2014   CL 98 06/20/2014   CREATININE 1.58* 06/20/2014   BUN 13 06/20/2014   CO2 20 06/20/2014   TSH 2.620 04/18/2013   PSA 0.4 09/15/2013   HGBA1C 5.5 06/20/2014    Mr Cervical Spine W Wo Contrast  06/23/2013   [facility]  NEUROIMAGING REPORT   STUDY DATE: 06/21/13 PATIENT NAME: Luke Davila DOB: Feb 23, 1965 MRN: [id number]  ORDERING CLINICIAN: Andrey Spearman, MD  CLINICAL HISTORY: 49 year old male with right optic neuritis.  EXAM: MRI cervical spine (with and without)  TECHNIQUE: MRI of the cervical spine was obtained utilizing 3 mm sagittal  slices from the posterior fossa down to the T3-4 level with T1, T2 and  inversion recovery views. In addition 4 mm axial slices from B6-3 down to  T1-2 level were included with T2 and gradient echo views. CONTRAST: 61m multihance  [facility] [address](1.5 Tesla MRI)    FINDINGS:  On sagittal views the vertebral bodies have normal height and alignment.  Mild disc bulging at C4-5. The spinal cord is normal in size and   appearance. The posterior fossa, pituitary gland and paraspinal soft  tissues are unremarkable.    On axial views: C2-3: no spinal stenosis or foraminal narrowing  C3-4: disc bulging and uncovertebral joint hypertrophy with mild  biforaminal stenosis  C4-5: disc bulging and uncovertebral joint hypertrophy with severe right  and moderate-severe left foraminal stenosis  C5-6: no spinal stenosis or foraminal narrowing C6-7: no spinal stenosis or foraminal narrowing  C7-T1: no spinal stenosis or foraminal narrowing   Limited views of the soft tissues of the head and neck are unremarkable.  No abnormal enhancing lesions.    06/23/2013   Mildly abnormal MRI cervical spine (with and without) demonstrating: 1. At C3-4: disc bulging and uncovertebral joint hypertrophy with mild  biforaminal stenosis. 2. At C4-5: disc bulging and uncovertebral joint hypertrophy with severe  right and moderate-severe left  foraminal stenosis. 3. No intrinsic, compressive or abnormal enhancing spinal cord lesions.   INTERPRETING PHYSICIAN:  Penni Bombard, MD Certified in Neurology, Neurophysiology and Neuroimaging  [facility] [address], [place], [place] [address] [phone]   Mr Thoracic Spine Moise Boring Contrast  06/23/2013   [facility]  NEUROIMAGING REPORT   STUDY DATE: 06/21/13 PATIENT NAME: Luke Davila DOB: 1965-05-20 MRN: [id number]  ORDERING CLINICIAN: Andrey Spearman, MD  CLINICAL HISTORY: 49 year old male with right optic neuritis.  EXAM: MRI thoracic spine (with and without)  TECHNIQUE: MRI of the thoracic spine was obtained utilizing 3 mm sagittal  slices from U7-6 down to the L1-2 level with T1, T2 and inversion recovery  views. In addition 4 mm axial slices from L4-Y5 down to T12-L1 level were  included with T1, T2 and gradient echo views.   CONTRAST: 19m multihance  [facility] [address](1.5 Tesla MRI)    FINDINGS:  On sagittal views the  vertebral bodies have normal height and alignment.   The spinal cord is normal in size and appearance. The paraspinal soft  tissues are unremarkable.    On axial views there is no spinal stenosis or foraminal narrowing.   Limited views of the aorta, kidneys, liver, lungs and paraspinal muscles  are unremarkable. No abnormal enhancing lesions.    06/23/2013   Normal MRI thoracic spine (with and without).   INTERPRETING PHYSICIAN:  VPenni Bombard MD Certified in Neurology, Neurophysiology and Neuroimaging  [facility] [address] [place] [phone]   Assessment & Plan:   SEldwinwas seen today for rash.  Diagnoses and all orders for this visit:  Nodule of finger of right hand Orders: -     DG Hand Complete Right; Future  Prediabetes Orders: -     POCT CBC -     CMP14+EGFR -     POCT glycosylated hemoglobin (Hb A1C)  Venous stasis Orders: -     [facility] -     Cancel: Lower Extremity Venous Ultrasound; Future  Dermatitis Orders: -     Cancel: Lower Extremity Venous Ultrasound; Future  Other orders -     fluocinonide-emollient (LIDEX-E) 0.05 % cream; Apply 1 application topically 2 (two) times daily.   I have discontinued Mr. Reveron's allopurinol, COLCRYS, naproxen, and diclofenac. I am also having him start on fluocinonide-emollient. Additionally, I am having him maintain his tamsulosin, testosterone cypionate, meloxicam, and testosterone.  Meds ordered this encounter  Medications  . fluocinonide-emollient (LIDEX-E) 0.05 % cream    Sig: Apply 1 application topically 2 (two) times daily.    Dispense:  60 g    Refill:  5     Follow-up: Return in about 3 months (around 09/20/2014), or if symptoms worsen or fail to improve.  WClaretta Fraise M.D.

## 2014-06-21 ENCOUNTER — Other Ambulatory Visit: Payer: Self-pay | Admitting: Family Medicine

## 2014-06-21 DIAGNOSIS — M79606 Pain in leg, unspecified: Secondary | ICD-10-CM

## 2014-06-21 DIAGNOSIS — I878 Other specified disorders of veins: Secondary | ICD-10-CM

## 2014-06-21 LAB — CMP14+EGFR
A/G RATIO: 1.6 (ref 1.1–2.5)
ALT: 25 IU/L (ref 0–44)
AST: 51 IU/L — AB (ref 0–40)
Albumin: 4.6 g/dL (ref 3.5–5.5)
Alkaline Phosphatase: 120 IU/L — ABNORMAL HIGH (ref 39–117)
BUN/Creatinine Ratio: 8 — ABNORMAL LOW (ref 9–20)
BUN: 13 mg/dL (ref 6–24)
Bilirubin Total: 0.4 mg/dL (ref 0.0–1.2)
CALCIUM: 9.6 mg/dL (ref 8.7–10.2)
CO2: 20 mmol/L (ref 18–29)
CREATININE: 1.58 mg/dL — AB (ref 0.76–1.27)
Chloride: 98 mmol/L (ref 97–108)
GFR calc Af Amer: 59 mL/min/{1.73_m2} — ABNORMAL LOW (ref >59)
GFR, EST NON AFRICAN AMERICAN: 51 mL/min/{1.73_m2} — AB (ref >59)
Globulin, Total: 2.9 g/dL (ref 1.5–4.5)
Glucose: 81 mg/dL (ref 65–99)
Potassium: 4.1 mmol/L (ref 3.5–5.2)
SODIUM: 134 mmol/L (ref 134–144)
Total Protein: 7.5 g/dL (ref 6.0–8.5)

## 2014-06-21 LAB — D-DIMER, QUANTITATIVE (NOT AT ARMC): D-DIMER: 0.4 mg{FEU}/L (ref 0.00–0.49)

## 2014-06-22 LAB — IRON AND TIBC
IRON SATURATION: 20 % (ref 15–55)
Iron: 66 ug/dL (ref 38–169)
TIBC: 327 ug/dL (ref 250–450)
UIBC: 261 ug/dL (ref 111–343)

## 2014-06-22 LAB — FERRITIN: Ferritin: 336 ng/mL (ref 30–400)

## 2014-06-22 LAB — SPECIMEN STATUS REPORT

## 2014-06-26 ENCOUNTER — Ambulatory Visit (HOSPITAL_COMMUNITY): Payer: 59 | Attending: Family Medicine

## 2014-06-26 ENCOUNTER — Encounter (HOSPITAL_COMMUNITY): Payer: Self-pay

## 2014-06-26 DIAGNOSIS — M79604 Pain in right leg: Secondary | ICD-10-CM | POA: Diagnosis not present

## 2014-06-26 DIAGNOSIS — I878 Other specified disorders of veins: Secondary | ICD-10-CM | POA: Diagnosis not present

## 2014-06-26 DIAGNOSIS — M79606 Pain in leg, unspecified: Secondary | ICD-10-CM

## 2014-06-26 DIAGNOSIS — M79605 Pain in left leg: Secondary | ICD-10-CM | POA: Insufficient documentation

## 2014-06-27 ENCOUNTER — Encounter (HOSPITAL_COMMUNITY): Payer: Self-pay

## 2014-07-20 ENCOUNTER — Ambulatory Visit: Payer: 59 | Admitting: Family Medicine

## 2014-07-31 ENCOUNTER — Ambulatory Visit (INDEPENDENT_AMBULATORY_CARE_PROVIDER_SITE_OTHER): Payer: 59 | Admitting: Family Medicine

## 2014-07-31 ENCOUNTER — Encounter: Payer: Self-pay | Admitting: Family Medicine

## 2014-07-31 VITALS — BP 132/84 | HR 89 | Temp 98.4°F | Ht 75.0 in | Wt 240.2 lb

## 2014-07-31 DIAGNOSIS — E291 Testicular hypofunction: Secondary | ICD-10-CM

## 2014-07-31 DIAGNOSIS — R748 Abnormal levels of other serum enzymes: Secondary | ICD-10-CM

## 2014-07-31 DIAGNOSIS — R7989 Other specified abnormal findings of blood chemistry: Secondary | ICD-10-CM

## 2014-07-31 DIAGNOSIS — R5383 Other fatigue: Secondary | ICD-10-CM

## 2014-07-31 DIAGNOSIS — E349 Endocrine disorder, unspecified: Secondary | ICD-10-CM

## 2014-07-31 NOTE — Progress Notes (Signed)
Subjective:  Patient ID: Luke Davila, male    DOB: March 17, 1965  Age: 49 y.o. MRN: [id number]  CC: Fatigue and elevated kidney function   HPI Hermen Mario presents for follow-up on diminished kidney function. He states that he has resumed his meloxicam due to some joint pains. It has helped. Additionally the patient states that for the first 2 weeks after the phone call about kidney function he started drinking more water. However she got distracted and busy in taking vacation and has gone back to drinking more [facility] and less water. The patient tells me he is having some muscle and joint pain and fatigue on a daily basis. He is still able to go about his normal routine he just doesn't feel as energetic as he feels like he should be.  History Hubbert has a past medical history of Gout.   He has past surgical history that includes Foot surgery (Left, 1985) and Left hand surgery.   His family history is not on file.He reports that he has never smoked. He has never used smokeless tobacco. He reports that he drinks alcohol. He reports that he does not use illicit drugs.  Outpatient Prescriptions Prior to Visit  Medication Sig Dispense Refill  . fluocinonide-emollient (LIDEX-E) 0.05 % cream Apply 1 application topically 2 (two) times daily. (Patient not taking: Reported on 07/31/2014) 60 g 5  . meloxicam (MOBIC) 15 MG tablet Take 1 tablet (15 mg total) by mouth daily. (Patient not taking: Reported on 07/31/2014) 30 tablet 5  . tamsulosin (FLOMAX) 0.4 MG CAPS capsule     . testosterone (ANDRODERM) 5 MG/24HR Place 1 patch onto the skin daily. (Patient not taking: Reported on 06/20/2014) 30 patch 1  . testosterone cypionate (DEPO-TESTOSTERONE) 200 MG/ML injection Inject 1 mL (200 mg total) into the muscle every 14 (fourteen) days. (Patient not taking: Reported on 06/20/2014) 10 mL 3   No facility-administered medications prior to visit.    ROS Review of Systems  Constitutional: Positive  for fatigue. Negative for fever, chills and diaphoresis.  HENT: Negative for congestion, rhinorrhea and sore throat.   Respiratory: Negative for cough, shortness of breath and wheezing.   Cardiovascular: Negative for chest pain.  Gastrointestinal: Negative for nausea, vomiting, abdominal pain, diarrhea, constipation and abdominal distention.  Genitourinary: Negative for dysuria and frequency.  Musculoskeletal: Positive for back pain (States he has dx of arthritis of spine), joint swelling and arthralgias (most major appendicular joints).  Skin: Negative for rash.  Neurological: Negative for headaches.    Objective:  BP 132/84 mmHg  Pulse 89  Temp(Src) 98.4 F (36.9 C) (Oral)  Ht _0  (1.905 m)  Wt 240 lb 3.2 oz (108.954 kg)  BMI 30.02 kg/m2  BP Readings from Last 3 Encounters:  07/31/14 132/84  06/20/14 127/83  03/01/14 148/93    Wt Readings from Last 3 Encounters:  07/31/14 240 lb 3.2 oz (108.954 kg)  06/20/14 243 lb (110.224 kg)  03/01/14 258 lb (117.028 kg)     Physical Exam  Constitutional: He is oriented to person, place, and time. He appears well-developed and well-nourished. No distress.  HENT:  Head: Normocephalic and atraumatic.  Right Ear: External ear normal.  Left Ear: External ear normal.  Nose: Nose normal.  Mouth/Throat: Oropharynx is clear and moist.  Eyes: Conjunctivae and EOM are normal. Pupils are equal, round, and reactive to light.  Neck: Normal range of motion. Neck supple. No thyromegaly present.  Cardiovascular: Normal rate, regular rhythm and normal heart sounds.  No murmur heard. Pulmonary/Chest: Effort normal and breath sounds normal. No respiratory distress. He has no wheezes. He has no rales.  Abdominal: Soft. Bowel sounds are normal. He exhibits no distension. There is no tenderness.  Lymphadenopathy:    He has no cervical adenopathy.  Neurological: He is alert and oriented to person, place, and time. He has normal reflexes.  Skin:  Skin is warm and dry.  Psychiatric: He has a normal mood and affect. His behavior is normal. Judgment and thought content normal.    Lab Results  Component Value Date   HGBA1C 5.5 06/20/2014   HGBA1C 5.3% 09/15/2013   HGBA1C 5.8* 04/18/2013    Lab Results  Component Value Date   WBC 3.4* 06/20/2014   HGB 12.2* 06/20/2014   HCT 39.3* 06/20/2014   GLUCOSE 81 06/20/2014   CHOL 243* 04/18/2013   TRIG 126 04/18/2013   HDL 58 04/18/2013   LDLCALC 160* 04/18/2013   ALT 25 06/20/2014   AST 51* 06/20/2014   NA 134 06/20/2014   K 4.1 06/20/2014   CL 98 06/20/2014   CREATININE 1.58* 06/20/2014   BUN 13 06/20/2014   CO2 20 06/20/2014   TSH 2.620 04/18/2013   PSA 0.4 09/15/2013   HGBA1C 5.5 06/20/2014    Mr Cervical Spine W Wo Contrast  06/23/2013   [facility]  NEUROIMAGING REPORT   STUDY DATE: 06/21/13 PATIENT NAME: Pheonix Clinkscale DOB: [birth year] MRN: [id number]  ORDERING CLINICIAN: Andrey Spearman, MD  CLINICAL HISTORY: 49 year old male with right optic neuritis.  EXAM: MRI cervical spine (with and without)  TECHNIQUE: MRI of the cervical spine was obtained utilizing 3 mm sagittal  slices from the posterior fossa down to the T3-4 level with T1, T2 and  inversion recovery views. In addition 4 mm axial slices from E2-6 down to  T1-2 level were included with T2 and gradient echo views. CONTRAST: 28m multihance  [facility]AGING [facility]ITE[facility] [address](1.5 Tesla MRI)    FINDINGS:  On sagittal views the vertebral bodies have normal height and alignment.  Mild disc bulging at C4-5. The spinal cord is normal in size and  appearance. The posterior fossa, pituitary gland and paraspinal soft  tissues are unremarkable.    On axial views: C2-3: no spinal stenosis or foraminal narrowing  C3-4: disc bulging and uncovertebral joint hypertrophy with mild  biforaminal stenosis  C4-5: disc bulging and uncovertebral joint hypertrophy with severe right  and moderate-severe left  foraminal stenosis  C5-6: no spinal stenosis or foraminal narrowing C6-7: no spinal stenosis or foraminal narrowing  C7-T1: no spinal stenosis or foraminal narrowing   Limited views of the soft tissues of the head and neck are unremarkable.  No abnormal enhancing lesions.    06/23/2013   Mildly abnormal MRI cervical spine (with and without) demonstrating: 1. At C3-4: disc bulging and uncovertebral joint hypertrophy with mild  biforaminal stenosis. 2. At C4-5: disc bulging and uncovertebral joint hypertrophy with severe  right and moderate-severe left foraminal stenosis. 3. No intrinsic, compressive or abnormal enhancing spinal cord lesions.   INTERPRETING PHYSICIAN:  VPenni Bombard MD Certified in Neurology, Neurophysiology and Neuroimaging  [facility] [address] [place] [phone]  Mr Thoracic Spine WMoise BoringContrast  06/23/2013   [facility]  NEUROIMAGING REPORT   STUDY DATE: 06/21/13 PATIENT NAME: SBreanna McdanielDOB: 8August 19, 1967MRN: [id number] ORDERING CLINICIAN: VAndrey Spearman MD  CLINICAL HISTORY: 49year  old male with right optic neuritis.  EXAM: MRI thoracic spine (with and without)  TECHNIQUE: MRI of the thoracic spine was obtained utilizing 3 mm sagittal  slices from K4-4 down to the L1-2 level with T1, T2 and inversion recovery  views. In addition 4 mm axial slices from B7-L2 down to T12-L1 level were  included with T1, T2 and gradient echo views.   CONTRAST: 93m multihance  [facility]ING [facility]ITE[facility] [address](1.5 Tesla MRI)    FINDINGS:  On sagittal views the vertebral bodies have normal height and alignment.   The spinal cord is normal in size and appearance. The paraspinal soft  tissues are unremarkable.    On axial views there is no spinal stenosis or foraminal narrowing.   Limited views of the aorta, kidneys, liver, lungs and paraspinal muscles  are unremarkable. No abnormal enhancing lesions.     06/23/2013   Normal MRI thoracic spine (with and without).   INTERPRETING PHYSICIAN:  VPenni Bombard MD Certified in Neurology, Neurophysiology and Neuroimaging  [facility] [address] [place]ast [place]  [phone]   Assessment & Plan:   SWyattwas seen today for fatigue and elevated kidney function.  Diagnoses and all orders for this visit:  Elevated serum creatinine Orders: -     CMP14+EGFR -     Thyroid Panel With TSH  Other fatigue Orders: -     CMP14+EGFR -     Thyroid Panel With TSH -     Testosterone,Free and Total  Testosterone deficiency Orders: -     Testosterone,Free and Total   I am having Mr. CFudalamaintain his tamsulosin, testosterone cypionate, meloxicam, testosterone, and fluocinonide-emollient.  No orders of the defined types were placed in this encounter.     Follow-up: Return in about 6 weeks (around 09/11/2014).  WClaretta Fraise M.D.

## 2014-08-01 ENCOUNTER — Other Ambulatory Visit: Payer: Self-pay | Admitting: Family Medicine

## 2014-08-01 ENCOUNTER — Telehealth: Payer: Self-pay | Admitting: Family Medicine

## 2014-08-01 LAB — CMP14+EGFR
A/G RATIO: 1.4 (ref 1.1–2.5)
ALT: 23 IU/L (ref 0–44)
AST: 39 IU/L (ref 0–40)
Albumin: 4.4 g/dL (ref 3.5–5.5)
Alkaline Phosphatase: 137 IU/L — ABNORMAL HIGH (ref 39–117)
BUN/Creatinine Ratio: 8 — ABNORMAL LOW (ref 9–20)
BUN: 9 mg/dL (ref 6–24)
Bilirubin Total: 0.3 mg/dL (ref 0.0–1.2)
CHLORIDE: 99 mmol/L (ref 97–108)
CO2: 22 mmol/L (ref 18–29)
CREATININE: 1.2 mg/dL (ref 0.76–1.27)
Calcium: 9.3 mg/dL (ref 8.7–10.2)
GFR, EST AFRICAN AMERICAN: 82 mL/min/{1.73_m2} (ref >59)
GFR, EST NON AFRICAN AMERICAN: 71 mL/min/{1.73_m2} (ref >59)
GLUCOSE: 82 mg/dL (ref 65–99)
Globulin, Total: 3.1 g/dL (ref 1.5–4.5)
POTASSIUM: 4.5 mmol/L (ref 3.5–5.2)
Sodium: 139 mmol/L (ref 134–144)
Total Protein: 7.5 g/dL (ref 6.0–8.5)

## 2014-08-01 LAB — TESTOSTERONE,FREE AND TOTAL
Testosterone, Free: 1.6 pg/mL — ABNORMAL LOW (ref 6.8–21.5)
Testosterone: 31 ng/dL — ABNORMAL LOW (ref 348–1197)

## 2014-08-01 LAB — THYROID PANEL WITH TSH
FREE THYROXINE INDEX: 0.9 — AB (ref 1.2–4.9)
T3 UPTAKE RATIO: 28 % (ref 24–39)
T4, Total: 3.2 ug/dL — ABNORMAL LOW (ref 4.5–12.0)
TSH: 2.39 u[IU]/mL (ref 0.450–4.500)

## 2014-08-01 MED ORDER — ANDROGEL 20.25 MG/1.25GM (1.62%) TD GEL: TRANSDERMAL | Status: DC

## 2014-08-01 NOTE — Telephone Encounter (Signed)
Please tell the patient that his testosterone is extremely low. It is almost certainly causing energy problems. It will also lead to thinning of the bones and loss of muscle tissue. It can also lead to heart problems at this level. I highly recommend that he start supplementation. I have sent in a prescription. He should have a recheck in 2 months. He should drop by 2-3 days before that follow-up to have a blood test drawn for testosterone. Additionally at that time we should recheck her thyroid functions because they were borderline as well.

## 2014-08-02 NOTE — Telephone Encounter (Signed)
Patient aware of results.

## 2014-08-02 NOTE — Telephone Encounter (Signed)
He is aware that he can come by tomorrow to pick up androgel prescription that it is waiting for Dr.Stacks to sign.

## 2014-08-09 ENCOUNTER — Telehealth: Payer: Self-pay | Admitting: Family Medicine

## 2014-08-09 NOTE — Telephone Encounter (Signed)
Gave to debbi 08/09/14

## 2014-08-11 ENCOUNTER — Telehealth: Payer: Self-pay | Admitting: Family Medicine

## 2014-08-11 NOTE — Telephone Encounter (Signed)
Have you got a request for prior authorization on Patient's Androgel?  If there is an alternative that insurance would cover , could you send the name of medication to provider for review?

## 2014-08-11 NOTE — Telephone Encounter (Signed)
Check on availability of other brands besides AndroGel. See if his insurance will cover axiron or fortesta or testim or androderm patch

## 2014-08-14 ENCOUNTER — Other Ambulatory Visit: Payer: Self-pay | Admitting: Nurse Practitioner

## 2014-08-15 ENCOUNTER — Other Ambulatory Visit: Payer: Self-pay

## 2014-08-15 ENCOUNTER — Telehealth: Payer: Self-pay

## 2014-08-15 ENCOUNTER — Other Ambulatory Visit: Payer: Self-pay | Admitting: *Deleted

## 2014-08-15 DIAGNOSIS — E291 Testicular hypofunction: Secondary | ICD-10-CM

## 2014-08-15 MED ORDER — TAMSULOSIN HCL 0.4 MG PO CAPS: 0.4000 mg | ORAL_CAPSULE | Freq: Every day | ORAL | Status: DC

## 2014-08-15 NOTE — Telephone Encounter (Signed)
Insurance said that this med is a plan exclusion for this member so no prior authorization can be done

## 2014-08-15 NOTE — Telephone Encounter (Signed)
Set him up on testosterone cypionate injections 1 mL every other week. After 2 months he should have a peak and trough blood test. A few days later he needs to follow-up with me.

## 2014-08-15 NOTE — Telephone Encounter (Signed)
Please advise 

## 2014-08-15 NOTE — Telephone Encounter (Signed)
When approved please call into [facility]

## 2014-08-15 NOTE — Telephone Encounter (Signed)
Patient aware and rx called to pharmacy  

## 2014-08-16 NOTE — Telephone Encounter (Signed)
Dr Darlyn Read switched to injection

## 2014-08-21 ENCOUNTER — Ambulatory Visit (INDEPENDENT_AMBULATORY_CARE_PROVIDER_SITE_OTHER): Payer: 59 | Admitting: *Deleted

## 2014-08-21 DIAGNOSIS — E291 Testicular hypofunction: Secondary | ICD-10-CM | POA: Diagnosis not present

## 2014-08-21 MED ORDER — TESTOSTERONE CYPIONATE 200 MG/ML IM SOLN
200.0000 mg | INTRAMUSCULAR | Status: AC
  Administered 2014-08-21 – 2014-11-20 (×10): 200 mg via INTRAMUSCULAR

## 2014-08-21 NOTE — Progress Notes (Signed)
Testosterone injection given and tolerated well.  

## 2014-08-21 NOTE — Patient Instructions (Signed)

## 2014-09-06 ENCOUNTER — Ambulatory Visit (INDEPENDENT_AMBULATORY_CARE_PROVIDER_SITE_OTHER): Payer: 59 | Admitting: *Deleted

## 2014-09-06 DIAGNOSIS — E291 Testicular hypofunction: Secondary | ICD-10-CM | POA: Diagnosis not present

## 2014-09-06 NOTE — Patient Instructions (Signed)

## 2014-09-06 NOTE — Progress Notes (Signed)
Testosterone injection given and tolerated well.  

## 2014-09-12 ENCOUNTER — Encounter: Payer: Self-pay | Admitting: Family Medicine

## 2014-09-12 ENCOUNTER — Ambulatory Visit (INDEPENDENT_AMBULATORY_CARE_PROVIDER_SITE_OTHER): Payer: 59 | Admitting: Family Medicine

## 2014-09-12 VITALS — BP 123/82 | HR 82 | Temp 97.2°F | Ht 75.0 in | Wt 242.6 lb

## 2014-09-12 DIAGNOSIS — E291 Testicular hypofunction: Secondary | ICD-10-CM | POA: Diagnosis not present

## 2014-09-12 NOTE — Progress Notes (Signed)
Subjective:  Patient ID: Luke Davila, male    DOB: 08-09-1965  Age: 49 y.o. MRN: [id number]  CC: Follow-up and Fatigue   HPI Luke Davila presents for follow-up on his hypogonadism. He has not yet noticed any improvement in that his energy is sense of well-being and his muscle strength have not been impacted yet. He's had 2 injections most recent was 6 days ago. He denies any pain from the injection shot site he denies any side effects from the medication itself. He still has not had an erection in over 2 months.  History Luke Davila has a past medical history of Gout.   He has past surgical history that includes Foot surgery (Left, 1985) and Left hand surgery.   His family history is not on file.He reports that he has never smoked. He has never used smokeless tobacco. He reports that he drinks alcohol. He reports that he does not use illicit drugs.  Outpatient Prescriptions Prior to Visit  Medication Sig Dispense Refill  . diclofenac (VOLTAREN) 75 MG EC tablet TAKE 1 TABLET (75 MG TOTAL) BY MOUTH 2 (TWO) TIMES DAILY. 60 tablet 0  . tamsulosin (FLOMAX) 0.4 MG CAPS capsule Take 1 capsule (0.4 mg total) by mouth daily. 30 capsule 5  . testosterone cypionate (DEPO-TESTOSTERONE) 200 MG/ML injection Inject 1 mL (200 mg total) into the muscle every 14 (fourteen) days. 10 mL 3  . ANDROGEL 20.25 MG/1.25GM (1.62%) GEL Apply 2 pumps to the upper chest daily 150 g 5  . meloxicam (MOBIC) 15 MG tablet Take 1 tablet (15 mg total) by mouth daily. (Patient not taking: Reported on 07/31/2014) 30 tablet 5  . fluocinonide-emollient (LIDEX-E) 0.05 % cream Apply 1 application topically 2 (two) times daily. (Patient not taking: Reported on 07/31/2014) 60 g 5   Facility-Administered Medications Prior to Visit  Medication Dose Route Frequency Provider Last Rate Last Dose  . testosterone cypionate (DEPOTESTOSTERONE CYPIONATE) injection 200 mg  200 mg Intramuscular Q14 Days Mechele Claude, MD   200 mg at 09/06/14 0926     ROS Review of Systems  Constitutional: Negative for activity change and appetite change.  Respiratory: Negative for cough, shortness of breath and wheezing.   Cardiovascular: Negative for chest pain and leg swelling.  Gastrointestinal: Negative for nausea, vomiting, abdominal pain, diarrhea and constipation.  Genitourinary: Negative for dysuria, frequency, penile swelling, scrotal swelling, penile pain and testicular pain.  Musculoskeletal: Negative for joint swelling and arthralgias.  Skin: Negative for rash.  Neurological: Negative for headaches.    Objective:  BP 123/82 mmHg  Pulse 82  Temp(Src) 97.2 F (36.2 C) (Oral)  Ht 6\' 3"  (1.905 m)  Wt 242 lb 9.6 oz (110.043 kg)  BMI 30.32 kg/m2  BP Readings from Last 3 Encounters:  09/12/14 123/82  07/31/14 132/84  06/20/14 127/83    Wt Readings from Last 3 Encounters:  09/12/14 242 lb 9.6 oz (110.043 kg)  07/31/14 240 lb 3.2 oz (108.954 kg)  06/20/14 243 lb (110.224 kg)     Physical Exam  Constitutional: He is oriented to person, place, and time. He appears well-developed and well-nourished. No distress.  HENT:  Head: Normocephalic and atraumatic.  Eyes: Conjunctivae are normal. Pupils are equal, round, and reactive to light.  Neck: Normal range of motion. Neck supple.  Cardiovascular: Normal rate, regular rhythm and normal heart sounds.   No murmur heard. Pulmonary/Chest: Effort normal and breath sounds normal. No respiratory distress. He has no wheezes. He has no rales.  Neurological: He is  alert and oriented to person, place, and time.  Skin: Skin is warm and dry.  Psychiatric: He has a normal mood and affect. His behavior is normal. Judgment and thought content normal.    Lab Results  Component Value Date   HGBA1C 5.5 06/20/2014   HGBA1C 5.3% 09/15/2013   HGBA1C 5.8* 04/18/2013    Lab Results  Component Value Date   WBC 3.4* 06/20/2014   HGB 12.2* 06/20/2014   HCT 39.3* 06/20/2014   GLUCOSE 82  07/31/2014   CHOL 243* 04/18/2013   TRIG 126 04/18/2013   HDL 58 04/18/2013   LDLCALC 160* 04/18/2013   ALT 23 07/31/2014   AST 39 07/31/2014   NA 139 07/31/2014   K 4.5 07/31/2014   CL 99 07/31/2014   CREATININE 1.20 07/31/2014   BUN 9 07/31/2014   CO2 22 07/31/2014   TSH 2.390 07/31/2014   PSA 0.4 09/15/2013   HGBA1C 5.5 06/20/2014    Mr Cervical Spine W Wo Contrast  06/23/2013   [facility]  NEUROIMAGING REPORT   STUDY DATE: 06/21/13 PATIENT NAME: Devaunte Gasparini DOB: 09/26/1965 MRN: [id number]  ORDERING CLINICIAN: Joycelyn Schmid, MD  CLINICAL HISTORY: 49 year old male with right optic neuritis.  EXAM: MRI cervical spine (with and without)  TECHNIQUE: MRI of the cervical spine was obtained utilizing 3 mm sagittal  slices from the posterior fossa down to the T3-4 level with T1, T2 and  inversion recovery views. In addition 4 mm axial slices from C2-3 down to  T1-2 level were included with T2 and gradient echo views. CONTRAST: 20ml multihance  IMAGING SITE: [facility] [address] (1.5 Tesla MRI)    FINDINGS:  On sagittal views the vertebral bodies have normal height and alignment.  Mild disc bulging at C4-5. The spinal cord is normal in size and  appearance. The posterior fossa, pituitary gland and paraspinal soft  tissues are unremarkable.    On axial views: C2-3: no spinal stenosis or foraminal narrowing  C3-4: disc bulging and uncovertebral joint hypertrophy with mild  biforaminal stenosis  C4-5: disc bulging and uncovertebral joint hypertrophy with severe right  and moderate-severe left foraminal stenosis  C5-6: no spinal stenosis or foraminal narrowing C6-7: no spinal stenosis or foraminal narrowing  C7-T1: no spinal stenosis or foraminal narrowing   Limited views of the soft tissues of the head and neck are unremarkable.  No abnormal enhancing lesions.    06/23/2013   Mildly abnormal MRI cervical spine (with and without) demonstrating: 1. At C3-4: disc  bulging and uncovertebral joint hypertrophy with mild  biforaminal stenosis. 2. At C4-5: disc bulging and uncovertebral joint hypertrophy with severe  right and moderate-severe left foraminal stenosis. 3. No intrinsic, compressive or abnormal enhancing spinal cord lesions.   INTERPRETING PHYSICIAN:  Suanne Marker, MD Certified in Neurology, Neurophysiology and Neuroimaging  [facility] [address], [address] [place], [place] [address] [phone]   Mr Thoracic Spine Vivien Rossetti Contrast  06/23/2013   [facility]  NEUROIMAGING REPORT   STUDY DATE: 06/21/13 PATIENT NAME: Lavontay Kirk DOB: 12/28/1965 MRN: [id number]  ORDERING CLINICIAN: Joycelyn Schmid, MD  CLINICAL HISTORY: 49 year old male with right optic neuritis.  EXAM: MRI thoracic spine (with and without)  TECHNIQUE: MRI of the thoracic spine was obtained utilizing 3 mm sagittal  slices from C4-5 down to the L1-2 level with T1, T2 and inversion recovery  views. In addition 4 mm axial slices from C7-T1 down to  T12-L1 level were  included with T1, T2 and gradient echo views.   CONTRAST: 20ml multihance  IMAGING SITE: [facility] [address] (1.5 Tesla MRI)    FINDINGS:  On sagittal views the vertebral bodies have normal height and alignment.   The spinal cord is normal in size and appearance. The paraspinal soft  tissues are unremarkable.    On axial views there is no spinal stenosis or foraminal narrowing.   Limited views of the aorta, kidneys, liver, lungs and paraspinal muscles  are unremarkable. No abnormal enhancing lesions.    06/23/2013   Normal MRI thoracic spine (with and without).   INTERPRETING PHYSICIAN:  Suanne Marker, MD Certified in Neurology, Neurophysiology and Neuroimaging  [facility] [address] [place], [place] [address] [phone]    Assessment & Plan:   Jaycen was seen today for follow-up and fatigue.  Diagnoses and all orders for this  visit:  Hypogonadism in male -     Testosterone,Free and Total; Future   I have discontinued Mr. Gassner's fluocinonide-emollient and ANDROGEL. I am also having him maintain his testosterone cypionate, meloxicam, diclofenac, and tamsulosin. We will continue to administer testosterone cypionate.  No orders of the defined types were placed in this encounter.   Continue injections every 2 weeks. We'll draw  trough level next week just prior to his injection.  Follow-up: Return in about 2 months (around 11/12/2014).  Mechele Claude, M.D.

## 2014-09-13 ENCOUNTER — Telehealth: Payer: Self-pay | Admitting: Family Medicine

## 2014-09-14 ENCOUNTER — Telehealth: Payer: Self-pay | Admitting: Family Medicine

## 2014-09-14 NOTE — Telephone Encounter (Signed)
lmom for Delice Bison to get more information to release this.

## 2014-09-14 NOTE — Telephone Encounter (Signed)
Note faxed 09/14/14

## 2014-09-19 ENCOUNTER — Ambulatory Visit (INDEPENDENT_AMBULATORY_CARE_PROVIDER_SITE_OTHER): Payer: 59 | Admitting: *Deleted

## 2014-09-19 ENCOUNTER — Other Ambulatory Visit (INDEPENDENT_AMBULATORY_CARE_PROVIDER_SITE_OTHER): Payer: 59

## 2014-09-19 DIAGNOSIS — E291 Testicular hypofunction: Secondary | ICD-10-CM

## 2014-09-19 DIAGNOSIS — E349 Endocrine disorder, unspecified: Secondary | ICD-10-CM

## 2014-09-19 NOTE — Progress Notes (Signed)
Pt given testosterone injection IM LUOQ and tolerated well. °

## 2014-09-19 NOTE — Patient Instructions (Signed)

## 2014-09-20 LAB — TESTOSTERONE,FREE AND TOTAL
TESTOSTERONE FREE: 15.4 pg/mL (ref 6.8–21.5)
TESTOSTERONE: 592 ng/dL (ref 348–1197)

## 2014-09-21 ENCOUNTER — Telehealth: Payer: Self-pay | Admitting: Family Medicine

## 2014-09-26 ENCOUNTER — Ambulatory Visit: Payer: 59

## 2014-09-26 ENCOUNTER — Telehealth: Payer: Self-pay | Admitting: *Deleted

## 2014-09-26 NOTE — Telephone Encounter (Signed)
That is correct 

## 2014-09-26 NOTE — Telephone Encounter (Signed)
Pt came in for testosterone inj He thought dose was changed to 200 mg IM weekly Please verify dose

## 2014-09-27 ENCOUNTER — Telehealth: Payer: Self-pay | Admitting: *Deleted

## 2014-09-27 ENCOUNTER — Other Ambulatory Visit: Payer: Self-pay | Admitting: Family Medicine

## 2014-09-27 DIAGNOSIS — E291 Testicular hypofunction: Secondary | ICD-10-CM

## 2014-09-27 MED ORDER — TESTOSTERONE CYPIONATE 200 MG/ML IM SOLN: 200.0000 mg | INTRAMUSCULAR | Status: DC

## 2014-09-27 NOTE — Telephone Encounter (Signed)
Pt notified of Dr Stack's recommendation 

## 2014-09-28 ENCOUNTER — Ambulatory Visit (INDEPENDENT_AMBULATORY_CARE_PROVIDER_SITE_OTHER): Payer: 59

## 2014-09-28 DIAGNOSIS — E291 Testicular hypofunction: Secondary | ICD-10-CM | POA: Diagnosis not present

## 2014-09-28 DIAGNOSIS — E349 Endocrine disorder, unspecified: Secondary | ICD-10-CM

## 2014-09-29 ENCOUNTER — Other Ambulatory Visit: Payer: 59

## 2014-10-02 ENCOUNTER — Telehealth: Payer: Self-pay | Admitting: Family Medicine

## 2014-10-02 ENCOUNTER — Other Ambulatory Visit: Payer: 59

## 2014-10-02 DIAGNOSIS — E291 Testicular hypofunction: Secondary | ICD-10-CM

## 2014-10-02 MED ORDER — TESTOSTERONE CYPIONATE 200 MG/ML IM SOLN: 200.0000 mg | INTRAMUSCULAR | Status: DC

## 2014-10-02 NOTE — Telephone Encounter (Signed)
Patient aware that rx has been sent in to pharmacy. 

## 2014-10-10 ENCOUNTER — Ambulatory Visit (INDEPENDENT_AMBULATORY_CARE_PROVIDER_SITE_OTHER): Payer: 59 | Admitting: *Deleted

## 2014-10-10 DIAGNOSIS — E291 Testicular hypofunction: Secondary | ICD-10-CM | POA: Diagnosis not present

## 2014-10-10 DIAGNOSIS — E349 Endocrine disorder, unspecified: Secondary | ICD-10-CM

## 2014-10-10 NOTE — Progress Notes (Signed)
Patient tolerated injection well.

## 2014-10-17 ENCOUNTER — Ambulatory Visit (INDEPENDENT_AMBULATORY_CARE_PROVIDER_SITE_OTHER): Payer: 59 | Admitting: *Deleted

## 2014-10-17 DIAGNOSIS — E349 Endocrine disorder, unspecified: Secondary | ICD-10-CM

## 2014-10-17 DIAGNOSIS — E291 Testicular hypofunction: Secondary | ICD-10-CM

## 2014-10-17 NOTE — Progress Notes (Signed)
Testosterone injection given and tolerated well.  

## 2014-10-17 NOTE — Patient Instructions (Signed)

## 2014-10-24 ENCOUNTER — Ambulatory Visit (INDEPENDENT_AMBULATORY_CARE_PROVIDER_SITE_OTHER): Payer: 59 | Admitting: *Deleted

## 2014-10-24 DIAGNOSIS — E291 Testicular hypofunction: Secondary | ICD-10-CM | POA: Diagnosis not present

## 2014-10-24 DIAGNOSIS — E349 Endocrine disorder, unspecified: Secondary | ICD-10-CM

## 2014-10-24 NOTE — Progress Notes (Signed)
Pt given testosterone injection IM LUOQ and tolerated well. °

## 2014-10-24 NOTE — Patient Instructions (Signed)

## 2014-10-31 ENCOUNTER — Ambulatory Visit (INDEPENDENT_AMBULATORY_CARE_PROVIDER_SITE_OTHER): Payer: 59 | Admitting: *Deleted

## 2014-10-31 DIAGNOSIS — E291 Testicular hypofunction: Secondary | ICD-10-CM | POA: Diagnosis not present

## 2014-10-31 DIAGNOSIS — E349 Endocrine disorder, unspecified: Secondary | ICD-10-CM

## 2014-10-31 NOTE — Progress Notes (Signed)
Pt given testosterone injection IM RUOQ and tolerated well. 

## 2014-10-31 NOTE — Patient Instructions (Signed)

## 2014-11-07 ENCOUNTER — Ambulatory Visit (INDEPENDENT_AMBULATORY_CARE_PROVIDER_SITE_OTHER): Payer: 59 | Admitting: *Deleted

## 2014-11-07 DIAGNOSIS — E349 Endocrine disorder, unspecified: Secondary | ICD-10-CM

## 2014-11-07 DIAGNOSIS — E291 Testicular hypofunction: Secondary | ICD-10-CM

## 2014-11-07 NOTE — Progress Notes (Signed)
Pt given testosterone injection IM LUOQ and tolerated well. °

## 2014-11-07 NOTE — Patient Instructions (Signed)

## 2014-11-14 ENCOUNTER — Ambulatory Visit (INDEPENDENT_AMBULATORY_CARE_PROVIDER_SITE_OTHER): Payer: 59

## 2014-11-14 DIAGNOSIS — E349 Endocrine disorder, unspecified: Secondary | ICD-10-CM

## 2014-11-14 DIAGNOSIS — E291 Testicular hypofunction: Secondary | ICD-10-CM | POA: Diagnosis not present

## 2014-11-20 ENCOUNTER — Ambulatory Visit (INDEPENDENT_AMBULATORY_CARE_PROVIDER_SITE_OTHER): Payer: 59 | Admitting: *Deleted

## 2014-11-20 DIAGNOSIS — E291 Testicular hypofunction: Secondary | ICD-10-CM

## 2014-11-20 DIAGNOSIS — E349 Endocrine disorder, unspecified: Secondary | ICD-10-CM

## 2014-11-20 NOTE — Progress Notes (Signed)
Pt given testosterone injection IM RUOQ and tolerated well. 

## 2014-11-20 NOTE — Patient Instructions (Signed)

## 2014-11-21 ENCOUNTER — Ambulatory Visit: Payer: 59

## 2014-11-23 ENCOUNTER — Other Ambulatory Visit: Payer: 59

## 2014-11-23 NOTE — Progress Notes (Signed)
Will come back for testosterone check 11/24/2014

## 2014-11-24 ENCOUNTER — Other Ambulatory Visit (INDEPENDENT_AMBULATORY_CARE_PROVIDER_SITE_OTHER): Payer: 59

## 2014-11-24 DIAGNOSIS — E291 Testicular hypofunction: Secondary | ICD-10-CM | POA: Diagnosis not present

## 2014-11-24 DIAGNOSIS — R7989 Other specified abnormal findings of blood chemistry: Secondary | ICD-10-CM

## 2014-11-24 NOTE — Progress Notes (Signed)
Lab only 

## 2014-11-25 ENCOUNTER — Other Ambulatory Visit: Payer: Self-pay | Admitting: Family Medicine

## 2014-11-25 DIAGNOSIS — E291 Testicular hypofunction: Secondary | ICD-10-CM

## 2014-11-25 LAB — TESTOSTERONE,FREE AND TOTAL
Testosterone, Free: 45.5 pg/mL — ABNORMAL HIGH (ref 6.8–21.5)
Testosterone: 1439 ng/dL — ABNORMAL HIGH (ref 348–1197)

## 2014-11-25 MED ORDER — TESTOSTERONE CYPIONATE 200 MG/ML IM SOLN: 150.0000 mg | INTRAMUSCULAR | Status: DC

## 2014-11-29 ENCOUNTER — Ambulatory Visit: Payer: 59

## 2014-11-29 DIAGNOSIS — E349 Endocrine disorder, unspecified: Secondary | ICD-10-CM

## 2014-11-29 MED ORDER — TESTOSTERONE CYPIONATE 200 MG/ML IM SOLN
150.0000 mg | INTRAMUSCULAR | Status: DC
  Administered 2014-11-29 – 2015-03-26 (×7): 150 mg via INTRAMUSCULAR

## 2014-12-01 ENCOUNTER — Ambulatory Visit: Payer: 59

## 2014-12-11 ENCOUNTER — Telehealth: Payer: Self-pay | Admitting: Family Medicine

## 2014-12-11 NOTE — Telephone Encounter (Signed)
Pt will check w/ [facility], Rx was sent on 11/19 w/ 3 RF's

## 2014-12-12 ENCOUNTER — Encounter: Payer: Self-pay | Admitting: Family Medicine

## 2014-12-12 ENCOUNTER — Ambulatory Visit (INDEPENDENT_AMBULATORY_CARE_PROVIDER_SITE_OTHER): Payer: 59 | Admitting: Family Medicine

## 2014-12-12 VITALS — BP 166/95 | HR 73 | Temp 98.0°F | Ht 75.0 in | Wt 263.2 lb

## 2014-12-12 DIAGNOSIS — R799 Abnormal finding of blood chemistry, unspecified: Secondary | ICD-10-CM | POA: Diagnosis not present

## 2014-12-12 DIAGNOSIS — M7062 Trochanteric bursitis, left hip: Secondary | ICD-10-CM | POA: Diagnosis not present

## 2014-12-12 MED ORDER — DICLOFENAC SODIUM 75 MG PO TBEC: 75.0000 mg | DELAYED_RELEASE_TABLET | Freq: Two times a day (BID) | ORAL | Status: DC

## 2014-12-12 NOTE — Patient Instructions (Signed)
Great to meet you!  Come back if you do not get better or if your symptoms are persistent  Try ice 15 min 2-3 times daily.   Try voltaren 1 pill twice daily for 5 days  See the stretching exercises i gave you  Hip Bursitis Bursitis is a swelling and soreness (inflammation) of a fluid-filled sac (bursa). This sac overlies and protects the joints.  CAUSES   Injury.  Overuse of the muscles surrounding the joint.  Arthritis.  Gout.  Infection.  Cold weather.  Inadequate warm-up and conditioning prior to activities. The cause may not be known.  SYMPTOMS   Mild to severe irritation.  Tenderness and swelling over the outside of the hip.  Pain with motion of the hip.  If the bursa becomes infected, a fever may be present. Redness, tenderness, and warmth will develop over the hip. Symptoms usually lessen in 3 to 4 weeks with treatment, but can come back. TREATMENT If conservative treatment does not work, your caregiver may advise draining the bursa and injecting cortisone into the area. This may speed up the healing process. This may also be used as an initial treatment of choice. HOME CARE INSTRUCTIONS   Apply ice to the affected area for 15-20 minutes every 3 to 4 hours while awake for the first 2 days. Put the ice in a plastic bag and place a towel between the bag of ice and your skin.  Rest the painful joint as much as possible, but continue to put the joint through a normal range of motion at least 4 times per day. When the pain lessens, begin normal, slow movements and usual activities to help prevent stiffness of the hip.  Only take over-the-counter or prescription medicines for pain, discomfort, or fever as directed by your caregiver.  Use crutches to limit weight bearing on the hip joint, if advised.  Elevate your painful hip to reduce swelling. Use pillows for propping and cushioning your legs and hips.  Gentle massage may provide comfort and decrease  swelling. SEEK IMMEDIATE MEDICAL CARE IF:   Your pain increases even during treatment, or you are not improving.  You have a fever.  You have heat and inflammation over the involved bursa.  You have any other questions or concerns. MAKE SURE YOU:   Understand these instructions.  Will watch your condition.  Will get help right away if you are not doing well or get worse.   This information is not intended to replace advice given to you by your health care provider. Make sure you discuss any questions you have with your health care provider.   Document Released: 06/14/2001 Document Revised: 03/17/2011 Document Reviewed: 07/25/2014 Elsevier Interactive Patient Education [employer]2016 Elsevier Inc.

## 2014-12-12 NOTE — Progress Notes (Signed)
   HPI  Patient presents today for left hip pain  Patient explains that over the last 2 weeks she's had left lateral hip pain. He states that it hurts worse with walking or laying down his left side it radiates down to his left knee. He feels like he has left knee swelling but has not seen any. He denies any injury. He has a faint recollection of having a similar pain previously but cannot remember it.  He is on prednisone daily for possible sarcoidosis and visual complications due to that.   PMH: Smoking status noted ROS: Per HPI  Objective: BP 166/95 mmHg  Pulse 73  Temp(Src) 98 F (36.7 C) (Oral)  Ht 6\' 3"  (1.905 m)  Wt 263 lb 3.2 oz (119.387 kg)  BMI 32.90 kg/m2 Gen: NAD, alert, cooperative with exam HEENT: NCAT CV: RRR, good S1/S2, no murmur Resp: CTABL, no wheezes, non-labored Ext: No edema, warm Neuro: Alert and oriented, No gross deficits  MSK: Left lateral hip pain with full flexion of the hip and with external rotation of the hip, no severe pain with palpation of the greater trochanter on the left  Assessment and plan:  # Left hip greater trochanter bursitis No severe pain on palpation of the greater trochanter, however he does have persistent symptoms in that area. No steroid injection as he did not hurt when I palpated it today. Scheduled NSAIDs 5 days Ice Stretching, given handout from sports medicine patient advisor      Murtis SinkSam Rhemi Balbach, MD [facility] 12/12/2014, 3:52 PM

## 2014-12-14 LAB — TESTOSTERONE,FREE AND TOTAL
Testosterone, Free: 14.8 pg/mL (ref 6.8–21.5)
Testosterone: 590 ng/dL (ref 348–1197)

## 2014-12-19 ENCOUNTER — Ambulatory Visit: Payer: 59

## 2014-12-19 ENCOUNTER — Ambulatory Visit (INDEPENDENT_AMBULATORY_CARE_PROVIDER_SITE_OTHER): Payer: 59

## 2014-12-19 DIAGNOSIS — E291 Testicular hypofunction: Secondary | ICD-10-CM | POA: Diagnosis not present

## 2014-12-19 DIAGNOSIS — E349 Endocrine disorder, unspecified: Secondary | ICD-10-CM

## 2014-12-26 ENCOUNTER — Other Ambulatory Visit: Payer: Self-pay | Admitting: Family Medicine

## 2015-01-03 ENCOUNTER — Other Ambulatory Visit: Payer: Self-pay

## 2015-01-03 MED ORDER — DICLOFENAC SODIUM 75 MG PO TBEC: 75.0000 mg | DELAYED_RELEASE_TABLET | Freq: Two times a day (BID) | ORAL | Status: DC

## 2015-01-15 ENCOUNTER — Encounter: Payer: Self-pay | Admitting: Family Medicine

## 2015-01-15 ENCOUNTER — Ambulatory Visit (INDEPENDENT_AMBULATORY_CARE_PROVIDER_SITE_OTHER): Payer: 59 | Admitting: Family Medicine

## 2015-01-15 VITALS — BP 141/87 | HR 94 | Temp 99.3°F | Ht 75.0 in | Wt 254.6 lb

## 2015-01-15 DIAGNOSIS — M7632 Iliotibial band syndrome, left leg: Secondary | ICD-10-CM | POA: Diagnosis not present

## 2015-01-15 DIAGNOSIS — M25552 Pain in left hip: Secondary | ICD-10-CM | POA: Diagnosis not present

## 2015-01-15 MED ORDER — DICLOFENAC SODIUM 75 MG PO TBEC: 75.0000 mg | DELAYED_RELEASE_TABLET | Freq: Two times a day (BID) | ORAL | Status: DC

## 2015-01-15 NOTE — Progress Notes (Signed)
   HPI  Patient presents today today to discuss hip and knee pain.  Patient explains he has had left lateral hip pain for about 6 weeks now. He was seen last month for thisand has had some good days since that time but overall has still been strolling with this pain.  It's changed slightlyhe's had intermittent left hip pain, that radiates down to his anterior and lateral left knee, at times he's only had lateral knee pain and then at times is only had lateral hip pain. He denies any injuries. He denies any fever, chills, sweats  He is resistant to seeing a specialist.  He is really unable to tell me if he had much improvement with the medications or stretches last time,however he has been trying icing, heat, and stretches for the last few weeks of not a whole lot of improvement.  He is on low-dose prednisone for concerns of sarcoid involvement in the eye  PMH: Smoking status noted ROS: Per HPI  Objective: BP 141/87 mmHg  Pulse 94  Temp(Src) 99.3 F (37.4 C) (Oral)  Ht 6\' 3"  (1.905 m)  Wt 254 lb 9.6 oz (115.486 kg)  BMI 31.82 kg/m2 Gen: NAD, alert, cooperative with exam HEENT: NCAT CV: RRR, good S1/S2, no murmur Resp: CTABL, no wheezes, non-labored Ext: No edema, warm Neuro: Alert and oriented, No gross deficits  Musculoskeletal: Tenderness over the left lateral tibial tubercle Lateral hip pain with flexion and abduction of the leg Full ROM   Assessment and plan:  # IT band syndrome,lateral hip pain  no real unifying etiology, they believe that he has IT band syndrome as well as possible greater trochanteric bursitis Scheduled NSAIDs 5 days Ice, heat, stretching Handout given from sports medicine patient advisor Return to clinic in 2 weeks, if still in pain consider a left  Knee x-ray and left hip x-rayas well as referral to orthopedic surgery    Meds ordered this encounter  Medications  . diclofenac (VOLTAREN) 75 MG EC tablet    Sig: Take 1 tablet (75 mg  total) by mouth 2 (two) times daily.    Dispense:  20 tablet    Refill:  2    Murtis SinkSam Bradshaw, MD [facility] 01/15/2015, 3:27 PM

## 2015-01-15 NOTE — Patient Instructions (Signed)
Come back in 2 weeks  Se the handout I gave you

## 2015-01-29 ENCOUNTER — Telehealth: Payer: Self-pay | Admitting: Family Medicine

## 2015-01-29 DIAGNOSIS — E291 Testicular hypofunction: Secondary | ICD-10-CM

## 2015-01-29 DIAGNOSIS — M7632 Iliotibial band syndrome, left leg: Secondary | ICD-10-CM

## 2015-01-29 DIAGNOSIS — M7630 Iliotibial band syndrome, unspecified leg: Secondary | ICD-10-CM | POA: Insufficient documentation

## 2015-01-29 DIAGNOSIS — M25552 Pain in left hip: Secondary | ICD-10-CM

## 2015-01-29 NOTE — Addendum Note (Signed)
Addended by: Elenora Gamma on: 01/29/2015 12:53 PM   Modules accepted: Orders

## 2015-01-29 NOTE — Telephone Encounter (Signed)
Is it okay to start the referral? Please advise

## 2015-01-30 NOTE — Telephone Encounter (Signed)
Needs to be seen. Drop by a day or two early for testosterone level.

## 2015-01-30 NOTE — Telephone Encounter (Signed)
Patient aware and order placed  

## 2015-02-01 ENCOUNTER — Ambulatory Visit (INDEPENDENT_AMBULATORY_CARE_PROVIDER_SITE_OTHER): Payer: 59 | Admitting: Family Medicine

## 2015-02-01 ENCOUNTER — Ambulatory Visit (HOSPITAL_BASED_OUTPATIENT_CLINIC_OR_DEPARTMENT_OTHER)
Admission: RE | Admit: 2015-02-01 | Discharge: 2015-02-01 | Disposition: A | Discharge status: Home / Self Care | Payer: 59 | Source: Ambulatory Visit | Attending: Family Medicine | Admitting: Family Medicine

## 2015-02-01 ENCOUNTER — Encounter: Payer: Self-pay | Admitting: Family Medicine

## 2015-02-01 VITALS — BP 131/83 | HR 105 | Ht 75.0 in | Wt 250.0 lb

## 2015-02-01 DIAGNOSIS — M25552 Pain in left hip: Secondary | ICD-10-CM | POA: Insufficient documentation

## 2015-02-01 DIAGNOSIS — M79605 Pain in left leg: Secondary | ICD-10-CM

## 2015-02-01 MED ORDER — PREDNISONE 10 MG PO TABS: ORAL_TABLET | ORAL | Status: DC

## 2015-02-01 MED ORDER — DICLOFENAC SODIUM 75 MG PO TBEC
75.0000 mg | DELAYED_RELEASE_TABLET | Freq: Two times a day (BID) | ORAL | Status: DC
Start: 2015-02-01 — End: 2015-03-19

## 2015-02-01 NOTE — Patient Instructions (Signed)
You have IT band syndrome and quad strain. Start physical therapy and do home exercises on days you don't go to therapy. Take prednisone dose pack for 6 days, when done with this you can start voltaren twice a day with food if needed. Compression sleeve for the quad pain. Heat 15 minutes at a time 3-4 times a day. Cortisone injection for the bursa on the side of the hip is an option but is unlikely to help with all these issues. Work restrictions as noted - call me in a couple weeks to let me know how you're doing. Otherwise follow up with me in 1 month to 6 weeks.

## 2015-02-02 ENCOUNTER — Other Ambulatory Visit (INDEPENDENT_AMBULATORY_CARE_PROVIDER_SITE_OTHER): Payer: 59

## 2015-02-02 DIAGNOSIS — E291 Testicular hypofunction: Secondary | ICD-10-CM | POA: Diagnosis not present

## 2015-02-04 LAB — TESTOSTERONE,FREE AND TOTAL
TESTOSTERONE FREE: 0.5 pg/mL — AB (ref 6.8–21.5)
TESTOSTERONE: 39 ng/dL — AB (ref 348–1197)

## 2015-02-05 ENCOUNTER — Telehealth: Payer: Self-pay | Admitting: Family Medicine

## 2015-02-05 NOTE — Progress Notes (Signed)
PCP: Mechele Claude, MD Consultation requested by: Kevin Fenton MD  Subjective:   HPI: Patient is a 50 y.o. male here for left hip pain.  Patient reports he's had about 1 month of left hip pain anteriorly and laterally radiating down to knee. Pain 6/10 at rest, up to 10/10 at times with walking. Limping as a result. Pain is sharp. Worse getting out of bed. No skin changes, fever, other complaints.  Past Medical History  Diagnosis Date  . Gout     Current Outpatient Prescriptions on File Prior to Visit  Medication Sig Dispense Refill  . allopurinol (ZYLOPRIM) 100 MG tablet TAKE 1 TABLET (100 MG TOTAL) BY MOUTH DAILY. 30 tablet 2  . tamsulosin (FLOMAX) 0.4 MG CAPS capsule TAKE 1 CAPSULE (0.4 MG TOTAL) BY MOUTH DAILY.    Marland Kitchen testosterone cypionate (DEPO-TESTOSTERONE) 200 MG/ML injection Inject 0.75 mLs (150 mg total) into the muscle once a week. 10 mL 3   Current Facility-Administered Medications on File Prior to Visit  Medication Dose Route Frequency Provider Last Rate Last Dose  . testosterone cypionate (DEPOTESTOSTERONE CYPIONATE) injection 150 mg  150 mg Intramuscular Q14 Days Mechele Claude, MD   150 mg at 12/19/14 1125    Past Surgical History  Procedure Laterality Date  . Foot surgery Left 1985  . Left hand surgery      No Known Allergies  Social History   Social History  . Marital Status: Married    Spouse Name: Ramona  . Number of Children: 2  . Years of Education: 12th   Occupational History  .      Media planner   Social History Main Topics  . Smoking status: Never Smoker   . Smokeless tobacco: Never Used  . Alcohol Use: Yes     Comment: 3 beers daily  . Drug Use: No  . Sexual Activity: Not on file   Other Topics Concern  . Not on file   Social History Narrative   Patient lives at home with his family.   Caffeine Use: 1 soda daily   Patient is right handed    No family history on file.  BP 131/83 mmHg  Pulse 105  Ht  (1.905 m)   Wt 250 lb (113.399 kg)  BMI 31.25 kg/m2  Review of Systems: See HPI above.    Objective:  Physical Exam:  Gen: NAD, comfortable in exam room.  Back/left hip: No gross deformity, scoliosis. TTP proximal half of quad and IT band .  No midline or bony TTP. Minimal limitation IR of hip with lateral IT band area pain with this.  No groin pain. Strength LEs 5/5 all muscle groups.   Negative SLRs. Sensation intact to light touch bilaterally. Negative logroll bilateral hips Negative fabers and piriformis stretches.    Assessment & Plan:  1. Left hip pain - consistent with proximal IT band syndrome and quad strain.  Independently reivewed radiographs of left femur to assess for bony lesion, early AVN (unlikely based on exam) - radiographs reassuring.  Start physical therapy, prednisone with compression sleeve and heat.  Work restrictions.  F/u in 1 month to 6 weeks.  Consider cortisone injection, MRI if not improving.

## 2015-02-05 NOTE — Assessment & Plan Note (Signed)
consistent with proximal IT band syndrome and quad strain.  Independently reivewed radiographs of left femur to assess for bony lesion, early AVN (unlikely based on exam) - radiographs reassuring.  Start physical therapy, prednisone with compression sleeve and heat.  Work restrictions.  F/u in 1 month to 6 weeks.  Consider cortisone injection, MRI if not improving.

## 2015-02-06 ENCOUNTER — Ambulatory Visit (INDEPENDENT_AMBULATORY_CARE_PROVIDER_SITE_OTHER): Payer: 59 | Admitting: Family Medicine

## 2015-02-06 ENCOUNTER — Encounter: Payer: Self-pay | Admitting: Family Medicine

## 2015-02-06 VITALS — BP 143/81 | HR 84 | Temp 97.3°F | Ht 75.0 in | Wt 259.4 lb

## 2015-02-06 DIAGNOSIS — E291 Testicular hypofunction: Secondary | ICD-10-CM | POA: Diagnosis not present

## 2015-02-06 DIAGNOSIS — M25552 Pain in left hip: Secondary | ICD-10-CM | POA: Diagnosis not present

## 2015-02-06 MED ORDER — PREDNISONE 20 MG PO TABS: ORAL_TABLET | ORAL | Status: DC

## 2015-02-06 MED ORDER — TESTOSTERONE CYPIONATE 200 MG/ML IM SOLN: 150.0000 mg | INTRAMUSCULAR | Status: DC

## 2015-02-06 NOTE — Addendum Note (Signed)
Addended by: Bearl Mulberry on: 02/06/2015 11:00 AM   Modules accepted: Orders

## 2015-02-06 NOTE — Progress Notes (Signed)
Subjective:  Patient ID: Luke Davila, male    DOB: [birth year]  Age: 50 y.o. MRN: [id number]  CC: testosterone deficiency   HPI Luke Davila presents for Left hip pain is getting more severe. Some relief on prednisone but only when at 5-6 tabs a day. Can only stand on it for a few minutes. Also no testosterone injection for over a month. Feels beat down. No energy. No erections. Testosterone was 39.   History Luke Davila has a past medical history of Gout.   He has past surgical history that includes Foot surgery (Left, 1985) and Left hand surgery.   His family history is not on file.He reports that he has never smoked. He has never used smokeless tobacco. He reports that he drinks alcohol. He reports that he does not use illicit drugs.    ROS Review of Systems  Constitutional: Positive for fatigue. Negative for fever, chills, diaphoresis and unexpected weight change.  HENT: Negative for congestion, hearing loss, rhinorrhea and sore throat.   Eyes: Negative for visual disturbance.  Respiratory: Negative for cough and shortness of breath.   Cardiovascular: Negative for chest pain.  Gastrointestinal: Negative for abdominal pain, diarrhea and constipation.  Genitourinary: Negative for dysuria and flank pain.  Musculoskeletal: Positive for arthralgias. Negative for joint swelling.  Skin: Negative for rash.  Neurological: Positive for weakness. Negative for dizziness and headaches.  Psychiatric/Behavioral: Negative for sleep disturbance and dysphoric mood.    Objective:  BP 143/81 mmHg  Pulse 84  Temp(Src) 97.3 F (36.3 C) (Oral)  Ht  (1.905 m)  Wt 259 lb 6.4 oz (117.663 kg)  BMI 32.42 kg/m2  SpO2 99%  BP Readings from Last 3 Encounters:  02/06/15 143/81  02/01/15 131/83  01/15/15 141/87    Wt Readings from Last 3 Encounters:  02/06/15 259 lb 6.4 oz (117.663 kg)  02/01/15 250 lb (113.399 kg)  01/15/15 254 lb 9.6 oz (115.486 kg)     Physical Exam    Constitutional: He is oriented to person, place, and time. He appears well-developed and well-nourished. No distress.  HENT:  Head: Normocephalic and atraumatic.  Right Ear: External ear normal.  Left Ear: External ear normal.  Nose: Nose normal.  Mouth/Throat: Oropharynx is clear and moist.  Eyes: Conjunctivae and EOM are normal. Pupils are equal, round, and reactive to light.  Neck: Normal range of motion. Neck supple. No thyromegaly present.  Cardiovascular: Normal rate, regular rhythm and normal heart sounds.   No murmur heard. Pulmonary/Chest: Effort normal and breath sounds normal. No respiratory distress. He has no wheezes. He has no rales.  Abdominal: Soft. Bowel sounds are normal. He exhibits no distension. There is no tenderness.  Musculoskeletal: Normal range of motion. He exhibits tenderness (lesser trochenter of Left hip).  Lymphadenopathy:    He has no cervical adenopathy.  Neurological: He is alert and oriented to person, place, and time. He has normal reflexes.  Skin: Skin is warm and dry.  Psychiatric: He has a normal mood and affect. His behavior is normal. Judgment and thought content normal.     Lab Results  Component Value Date   WBC 3.4* 06/20/2014   HGB 12.2* 06/20/2014   HCT 39.3* 06/20/2014   GLUCOSE 82 07/31/2014   CHOL 243* 04/18/2013   TRIG 126 04/18/2013   HDL 58 04/18/2013   LDLCALC 160* 04/18/2013   ALT 23 07/31/2014   AST 39 07/31/2014   NA 139 07/31/2014   K 4.5 07/31/2014   CL 99 07/31/2014  CREATININE 1.20 07/31/2014   BUN 9 07/31/2014   CO2 22 07/31/2014   TSH 2.390 07/31/2014   PSA 0.4 09/15/2013   HGBA1C 5.5 06/20/2014    Dg Femur Min 2 Views Left  02/01/2015  CLINICAL DATA:  Left hip pain for 1 month EXAM: LEFT FEMUR 2 VIEWS COMPARISON:  None FINDINGS: No fracture dislocation. Mild degenerative changes identified in the left hip. The remainder of the left femur is normal as are visualized pelvic bones. IMPRESSION: Mild  degenerative changes in the left hip. Electronically Signed   By: Gerome Sam III M.D   On: 02/01/2015 10:50    Assessment & Plan:   Luke Davila was seen today for testosterone deficiency.  Diagnoses and all orders for this visit:  Hypogonadism in male -     testosterone cypionate (DEPO-TESTOSTERONE) 200 MG/ML injection; Inject 0.75 mLs (150 mg total) into the muscle every 14 (fourteen) days.  Lateral pain of left hip -     Ambulatory referral to Physical Therapy  Other orders -     predniSONE (DELTASONE) 20 MG tablet; One TID X 2 wks, then 1 BID X 2 weeks then 1 qday X 2wks      I have discontinued Luke Davila's predniSONE. I have also changed his testosterone cypionate. Additionally, I am having him start on predniSONE. Lastly, I am having him maintain his tamsulosin, allopurinol, and diclofenac. We will continue to administer testosterone cypionate.  Meds ordered this encounter  Medications  . testosterone cypionate (DEPO-TESTOSTERONE) 200 MG/ML injection    Sig: Inject 0.75 mLs (150 mg total) into the muscle every 14 (fourteen) days.    Dispense:  10 mL    Refill:  3  . predniSONE (DELTASONE) 20 MG tablet    Sig: One TID X 2 wks, then 1 BID X 2 weeks then 1 qday X 2wks    Dispense:  84 tablet    Refill:  0   Consider MRI pituitary. Recheck thyroid  Follow-up: No Follow-up on file.  Mechele Claude, M.D.

## 2015-02-06 NOTE — Patient Instructions (Signed)
Follow up in 6 weeks. Be sure to have a testosterone level drawn just before your shot 6 weeks from now. Then Have one drawn the next day (Peak & trough, as we discussed.) Come for appointment 2-3 days after that so we can review the results.

## 2015-02-07 ENCOUNTER — Telehealth: Payer: Self-pay | Admitting: Family Medicine

## 2015-02-07 LAB — CMP14+EGFR
A/G RATIO: 1.6 (ref 1.1–2.5)
ALT: 16 IU/L (ref 0–44)
AST: 15 IU/L (ref 0–40)
Albumin: 4.1 g/dL (ref 3.5–5.5)
Alkaline Phosphatase: 99 IU/L (ref 39–117)
BILIRUBIN TOTAL: 0.4 mg/dL (ref 0.0–1.2)
BUN/Creatinine Ratio: 13 (ref 9–20)
BUN: 16 mg/dL (ref 6–24)
CALCIUM: 9.6 mg/dL (ref 8.7–10.2)
CO2: 27 mmol/L (ref 18–29)
Chloride: 98 mmol/L (ref 96–106)
Creatinine, Ser: 1.2 mg/dL (ref 0.76–1.27)
GFR, EST AFRICAN AMERICAN: 82 mL/min/{1.73_m2} (ref >59)
GFR, EST NON AFRICAN AMERICAN: 71 mL/min/{1.73_m2} (ref >59)
GLOBULIN, TOTAL: 2.6 g/dL (ref 1.5–4.5)
Glucose: 67 mg/dL (ref 65–99)
POTASSIUM: 4.2 mmol/L (ref 3.5–5.2)
SODIUM: 138 mmol/L (ref 134–144)
TOTAL PROTEIN: 6.7 g/dL (ref 6.0–8.5)

## 2015-02-07 LAB — TSH: TSH: 1.88 u[IU]/mL (ref 0.450–4.500)

## 2015-02-07 LAB — T4, FREE: FREE T4: 0.73 ng/dL — AB (ref 0.82–1.77)

## 2015-02-12 ENCOUNTER — Ambulatory Visit: Payer: 59 | Admitting: Physical Therapy

## 2015-02-12 ENCOUNTER — Ambulatory Visit: Payer: 59 | Admitting: Sports Medicine

## 2015-02-13 ENCOUNTER — Ambulatory Visit: Payer: 59 | Attending: Family Medicine | Admitting: Physical Therapy

## 2015-02-13 DIAGNOSIS — R6889 Other general symptoms and signs: Secondary | ICD-10-CM | POA: Diagnosis present

## 2015-02-13 DIAGNOSIS — M2560 Stiffness of unspecified joint, not elsewhere classified: Secondary | ICD-10-CM

## 2015-02-13 DIAGNOSIS — M25552 Pain in left hip: Secondary | ICD-10-CM | POA: Diagnosis not present

## 2015-02-13 NOTE — Telephone Encounter (Signed)
Spoke with Luke Davila and she states she was trying to contact the pt and didn't need any information from [place].

## 2015-02-13 NOTE — Therapy (Signed)
[facility] [address] [place], [place], [address] Phone: [phone]   Fax:  [phone]  Physical Therapy Evaluation  Patient Details  Name: Luke Davila MRN: [id number] Date of Birth: 1965-10-07 Referring Provider: Mechele Claude, MD  Encounter Date: 02/13/2015      PT End of Session - 02/13/15 0810    Visit Number 1   Number of Visits 12   Date for PT Re-Evaluation 03/27/15   PT Start Time 0814   PT Stop Time 0912   PT Time Calculation (min) 58 min   Activity Tolerance Patient tolerated treatment well;Patient limited by pain   Behavior During Therapy [facility] for tasks assessed/performed      Past Medical History  Diagnosis Date  . Gout     Past Surgical History  Procedure Laterality Date  . Foot surgery Left 1985  . Left hand surgery      There were no vitals filed for this visit.  Visit Diagnosis:  Left hip pain - Plan: PT plan of care cert/re-cert  Stiffness of joints, multiple sites - Plan: PT plan of care cert/re-cert  Activity intolerance - Plan: PT plan of care cert/re-cert      Subjective Assessment - 02/13/15 0803    Subjective Patient reports insidious onset of L hip/knee pain 1.5 months ago. Difficult to put pressure through leg. It feels like it is going to give out.  The prednisone did not help originally, so he is now on an increased dosage.   Limitations Standing;Walking  sit to stand transfer   How long can you stand comfortably? 15 min   How long can you walk comfortably? 15 min   Diagnostic tests xrays of hip and knee    Patient Stated Goals decrease pain, improve movement   Currently in Pain? Yes   Pain Score 7   up to 9/10 with walking   Pain Location Hip   Pain Orientation Left;Lateral   Pain Descriptors / Indicators --  it just hurts   Pain Type Acute pain   Pain Radiating Towards down lateral thigh to knee and sometimes into groin   Pain Onset More than a month ago   Pain Frequency  Constant   Aggravating Factors  sit to stand and walking   Pain Relieving Factors arthritis cream   Effect of Pain on Daily Activities works in a warehouse so must walk around; daily ADLs painful            [facility] PT Assessment - 02/13/15 0001    Assessment   Medical Diagnosis Left lateral hip pain   Referring Provider Mechele Claude, MD   Onset Date/Surgical Date 01/12/15   Precautions   Precautions None   Restrictions   Weight Bearing Restrictions No   Balance Screen   Has the patient fallen in the past 6 months No   Has the patient had a decrease in activity level because of a fear of falling?  No   Is the patient reluctant to leave their home because of a fear of falling?  No   Home Environment   Additional Comments seven steps to enter home; one level home   Prior Function   Level of Independence Independent   Vocation Full time employment   Vocation Requirements walking   Observation/Other Assessments   Observations stands in lumbar flexion   ROM / Strength   AROM / PROM / Strength Strength;AROM   AROM   AROM Assessment Site Hip   Right/Left Hip  Left;Right   Right Hip External Rotation  20   Right Hip Internal Rotation  24   Left Hip External Rotation  12   Left Hip Internal Rotation  25  pulls in groin   Strength   Overall Strength Comments L hip flex 4+/5; else 5/5 BLE   Flexibility   Soft Tissue Assessment /Muscle Length yes   Hamstrings marked tightness B   Quadriceps Marked tightness to 90 deg in prone B  and B HF   ITB + Ober L   Piriformis marked tightness B (R > L)   Palpation   Patella mobility L patella WNL   Palpation comment marked tenderness and active TPs in L glut medius and TFL; mod tenderness of L piriformis   Ambulation/Gait   Ambulation Distance (Feet) 30 Feet   Assistive device None   Gait Pattern Antalgic;Decreased weight shift to left;Decreased stance time - left;Decreased step length - right   Ambulation Surface Level                    [facility] Adult PT Treatment/Exercise - 02/13/15 0001    Modalities   Modalities Electrical Stimulation;Moist Heat   Moist Heat Therapy   Number Minutes Moist Heat 15 Minutes   Moist Heat Location Hip  leg   Electrical Stimulation   Electrical Stimulation Location L glut med and adductors; L ITB   Electrical Stimulation Action Premod to tolerance x 15 min 80-150 Hz   Electrical Stimulation Goals Pain                PT Education - 02/13/15 1143    Education provided Yes   Education Details HEP: attempted multiple stretches for ITB, piriformis and those given for HS and quads.   Person(s) Educated Patient   Methods Explanation;Demonstration;Handout   Comprehension Verbalized understanding;Returned demonstration             PT Long Term Goals - 02/13/15 1140    PT LONG TERM GOAL #1   Title I with HEP   Time 6   Status New   PT LONG TERM GOAL #2   Title Able to ambulate without L hip pain   Time 6   Period Weeks   Status New   PT LONG TERM GOAL #3   Title able to stand without forward trunk lean   Time 6   Period Weeks   Status New   PT LONG TERM GOAL #4   Title able to perform sit to stand without pain   Time 6   Period Weeks   Status New               Plan - 02/13/15 1132    Clinical Impression Statement Patient presents today with marked pain in L lateral hip affecting gait and other ADLS. The pain refers down his lateral leg and into his L groin. He has marked tightness and active TPs in B hips and knees him to stand in lumbar flexion with flexed knees. Patient responded well to estim today reporting less pain afterward and demonstrating improved but still antalgic gait pattern.   Pt will benefit from skilled therapeutic intervention in order to improve on the following deficits Decreased range of motion;Difficulty walking;Decreased activity tolerance;Pain;Impaired flexibility;Postural dysfunction   Rehab Potential  Excellent   PT Frequency 2x / week   PT Duration 6 weeks   PT Treatment/Interventions ADLs/Self Care Home Management;Electrical Stimulation;Moist Heat;Therapeutic exercise;Ultrasound;Neuromuscular re-education;Patient/family education;Manual techniques;Dry needling;Passive range of motion  PT Next Visit Plan [place] to L glut medius; STW/MFR to L glut med, ITB, quads, HS, adductor with passive stretching; progress to Nustep; progress HEP for flexibility (HF, piriformis, hip rotators)   PT Home Exercise Plan HS and prone quad stretch with strap   Consulted and Agree with Plan of Care Patient         Problem List Patient Active Problem List   Diagnosis Date Noted  . Left hip pain 01/29/2015  . IT band syndrome 01/29/2015  . Hypogonadism in male 09/12/2014  . Right optic neuritis 08/30/2013  . Erectile dysfunction 06/25/2012  . Gout 06/25/2012    Solon Palm PT  02/13/2015, 11:48 AM  [facility] [address] [place], [place], [address] Phone: [phone]   Fax:  [phone]  Name: Luke Davila MRN: [id number] Date of Birth: [birth year]

## 2015-02-16 ENCOUNTER — Encounter: Payer: Self-pay | Admitting: Physical Therapy

## 2015-02-16 ENCOUNTER — Ambulatory Visit: Payer: 59 | Admitting: Physical Therapy

## 2015-02-16 DIAGNOSIS — M25552 Pain in left hip: Secondary | ICD-10-CM | POA: Diagnosis not present

## 2015-02-16 DIAGNOSIS — R6889 Other general symptoms and signs: Secondary | ICD-10-CM

## 2015-02-16 DIAGNOSIS — M2560 Stiffness of unspecified joint, not elsewhere classified: Secondary | ICD-10-CM

## 2015-02-16 NOTE — Therapy (Signed)
[facility] [address] [place], [place], [address] Phone: [phone]   Fax:  [phone]  Physical Therapy Treatment  Patient Details  Name: Luke Davila MRN: [id number] Date of Birth: [birth year] Referring Provider: Mechele Claude, MD  Encounter Date: 02/16/2015      PT End of Session - 02/16/15 0818    Visit Number 2   Number of Visits 12   Date for PT Re-Evaluation 03/27/15   PT Start Time 0818   PT Stop Time 0902   PT Time Calculation (min) 44 min   Activity Tolerance Patient tolerated treatment well   Behavior During Therapy [facility] for tasks assessed/performed      Past Medical History  Diagnosis Date  . Gout     Past Surgical History  Procedure Laterality Date  . Foot surgery Left 1985  . Left hand surgery      There were no vitals filed for this visit.  Visit Diagnosis:  Left hip pain  Stiffness of joints, multiple sites  Activity intolerance      Subjective Assessment - 02/16/15 0817    Subjective Reports that soreness in L hip and intermittant sharp thigh pain.    Limitations Standing;Walking   How long can you stand comfortably? 15 min   How long can you walk comfortably? 15 min   Diagnostic tests xrays of hip and knee    Patient Stated Goals decrease pain, improve movement   Currently in Pain? Yes   Pain Score 7    Pain Location Knee   Pain Orientation Left   Pain Descriptors / Indicators Sore   Pain Type Acute pain   Pain Onset More than a month ago            [facility] PT Assessment - 02/16/15 0001    Assessment   Medical Diagnosis Left lateral hip pain   Onset Date/Surgical Date 01/12/15                     [facility] [place] [facility] Adult PT Treatment/Exercise - 02/16/15 0001    Exercises   Exercises Knee/Hip   Knee/Hip Exercises: Stretches   ITB Stretch Left;1 rep;20 seconds   Piriformis Stretch Left;2 reps;30 seconds   Other Knee/Hip Stretches L supine adductors stretch 3x30 sec    Modalities   Modalities Ultrasound   Ultrasound   Ultrasound Location L Glut Med   Ultrasound Parameters 1.5 w/cm2, 100%,1 mhz x10 min   Ultrasound Goals Pain   Manual Therapy   Manual Therapy Myofascial release   Myofascial Release IASTW to L ITB in R sidelying to decrease adhesions and tightness; MFR to L Glut Med to decrease tightness and pain                PT Education - 02/16/15 0912    Education provided Yes   Education Details HEP- adductor and ITB stretch in supine    Person(s) Educated Patient   Methods Explanation;Demonstration;Tactile cues;Verbal cues;Handout   Comprehension Verbalized understanding;Returned demonstration;Verbal cues required;Tactile cues required             PT Long Term Goals - 02/16/15 0912    PT LONG TERM GOAL #1   Title I with HEP   Time 6   Status Achieved   PT LONG TERM GOAL #2   Title Able to ambulate without L hip pain   Time 6   Period Weeks   Status New   PT LONG TERM GOAL #3   Title able  to stand without forward trunk lean   Time 6   Period Weeks   Status New   PT LONG TERM GOAL #4   Title able to perform sit to stand without pain   Time 6   Period Weeks   Status New               Plan - 02/16/15 1259    Clinical Impression Statement Patient tolerated today's treatment well although he presented with antalgic gait and increased L hip discomfort. Patient has experienced decreased abilty to complete ADLs per patient report. Patient compliant with initial HEP given at evaluation. Patient presented with increased tightness noted in L Gluteus Medius, ITB and adductors with continued tightness in L HS but not as predominate as the treated muscles. Normal [place] response noted following end of the [place] session. Patient experienced soreness with contact or manual therapy over L Gluteus Medius per patient report. Tolerated IASTW to L ITB well with no reports of soreness or discomfort and was educated regarding the purpose and  that he may experience soreness following IASTW. Patient accepted additional stretching HEP today without questions and verbalized understanding. Patient experienced decreased tightness following today's treatment per patient report.   Pt will benefit from skilled therapeutic intervention in order to improve on the following deficits Decreased range of motion;Difficulty walking;Decreased activity tolerance;Pain;Impaired flexibility;Postural dysfunction   Rehab Potential Excellent   PT Frequency 2x / week   PT Duration 6 weeks   PT Treatment/Interventions ADLs/Self Care Home Management;Electrical Stimulation;Moist Heat;Therapeutic exercise;Ultrasound;Neuromuscular re-education;Patient/family education;Manual techniques;Dry needling;Passive range of motion   PT Next Visit Plan Continue stretching and manual therapy with modalities as needed for L hip tightness and progress to exercises per MPT POC as symptoms dictate.   PT Home Exercise Plan HS and prone quad stretch with strap; adductors and ITB stretch   Consulted and Agree with Plan of Care Patient        Problem List Patient Active Problem List   Diagnosis Date Noted  . Left hip pain 01/29/2015  . IT band syndrome 01/29/2015  . Hypogonadism in male 09/12/2014  . Right optic neuritis 08/30/2013  . Erectile dysfunction 06/25/2012  . Gout 06/25/2012    Evelene Croon, PTA 02/16/2015, 1:06 PM  [facility] [address] [place], [place], [address] Phone: [phone]   Fax:  [phone]  Name: Luke Davila MRN: [id number] Date of Birth: July 31, 1965

## 2015-02-16 NOTE — Patient Instructions (Signed)
Stretching: Tensor    Lay on your back with your left leg straight. Put leash around left foot. Lift leg straight into air and let it fall to right side. Hold _30___ seconds. Repeat __3__ times per set. Do __1__ sets per session. Do __3-4__ sessions per day.  http://orth.exer.us/652   Copyright  VHI. All rights reserved.  Stretching: Inner Thigh / Groin    Place heels together and pull feet toward groin until stretch is felt in groin and inner thigh. Hold __30__ seconds. Repeat _3___ times per set. Do __1__ sets per session. Do __3-4__ sessions per day.  http://orth.exer.us/644   Copyright  VHI. All rights reserved.

## 2015-02-19 ENCOUNTER — Ambulatory Visit: Payer: 59 | Admitting: Physical Therapy

## 2015-02-19 ENCOUNTER — Encounter: Payer: Self-pay | Admitting: Physical Therapy

## 2015-02-19 ENCOUNTER — Telehealth: Payer: Self-pay | Admitting: Family Medicine

## 2015-02-19 ENCOUNTER — Ambulatory Visit (INDEPENDENT_AMBULATORY_CARE_PROVIDER_SITE_OTHER): Payer: 59 | Admitting: *Deleted

## 2015-02-19 DIAGNOSIS — M25552 Pain in left hip: Secondary | ICD-10-CM

## 2015-02-19 DIAGNOSIS — E291 Testicular hypofunction: Secondary | ICD-10-CM | POA: Diagnosis not present

## 2015-02-19 DIAGNOSIS — M2560 Stiffness of unspecified joint, not elsewhere classified: Secondary | ICD-10-CM

## 2015-02-19 DIAGNOSIS — R6889 Other general symptoms and signs: Secondary | ICD-10-CM

## 2015-02-19 NOTE — Telephone Encounter (Signed)
Please address

## 2015-02-19 NOTE — Therapy (Signed)
[facility] [address] [place], [place], [address] Phone: [phone]   Fax:  [phone]  Physical Therapy Treatment  Patient Details  Name: Luke Davila MRN: [id number] Date of Birth: 05/05/1965 Referring Provider: Mechele Claude, MD  Encounter Date: 02/19/2015      PT End of Session - 02/19/15 0808    Visit Number 3   Number of Visits 12   Date for PT Re-Evaluation 03/27/15   PT Start Time 0728   PT Stop Time 0818   PT Time Calculation (min) 50 min   Activity Tolerance Patient tolerated treatment well   Behavior During Therapy [facility] for tasks assessed/performed      Past Medical History  Diagnosis Date  . Gout     Past Surgical History  Procedure Laterality Date  . Foot surgery Left 1985  . Left hand surgery      There were no vitals filed for this visit.  Visit Diagnosis:  Left hip pain  Stiffness of joints, multiple sites  Activity intolerance      Subjective Assessment - 02/19/15 0734    Subjective Pain level was so bad yesterday he could not walk and leg was giving out so he went to hospital and they gave him pain shot in arm for pain, patient is to schedule MRI with MD   Limitations Standing;Walking   How long can you stand comfortably? 15 min   How long can you walk comfortably? 15 min   Diagnostic tests xrays of hip and knee    Patient Stated Goals decrease pain, improve movement   Currently in Pain? Yes   Pain Score 8    Pain Orientation Left   Pain Descriptors / Indicators Sore   Pain Type Acute pain   Pain Onset More than a month ago   Pain Frequency Constant   Aggravating Factors  standing   Pain Relieving Factors sitting                         [facility] Adult PT Treatment/Exercise - 02/19/15 0001    Moist Heat Therapy   Number Minutes Moist Heat 15 Minutes   Moist Heat Location Hip   Electrical Stimulation   Electrical Stimulation Location L glut med and adductors; L ITB   Electrical Stimulation Action premod   Electrical Stimulation Goals Pain   Ultrasound   Ultrasound Location left glut med   Ultrasound Parameters 1.5w/cm2/50%/76mhz x20min     Ultrasound Goals Pain   Manual Therapy   Manual Therapy Myofascial release   Myofascial Release IASTW to L ITB in R sidelying to decrease adhesions and tightness; MFR to L Glut Med to decrease tightness and pain                     PT Long Term Goals - 02/16/15 0912    PT LONG TERM GOAL #1   Title I with HEP   Time 6   Status Achieved   PT LONG TERM GOAL #2   Title Able to ambulate without L hip pain   Time 6   Period Weeks   Status New   PT LONG TERM GOAL #3   Title able to stand without forward trunk lean   Time 6   Period Weeks   Status New   PT LONG TERM GOAL #4   Title able to perform sit to stand without pain   Time 6   Period Weeks  Status New               Plan - 02/19/15 1610    Clinical Impression Statement Patient continues to complain of pain in left hip area with walking or laying on back. Patient has reported some temporary relief after therapy yet pain returns. Patient went to hospital due to high pain level and had a shot put in arm. Patient was advised to get MRI. Patient unable to meet any further goals due to high pain level. Patient had antalgic gait today.   Pt will benefit from skilled therapeutic intervention in order to improve on the following deficits Decreased range of motion;Difficulty walking;Decreased activity tolerance;Pain;Impaired flexibility;Postural dysfunction   Rehab Potential Excellent   PT Frequency 2x / week   PT Duration 6 weeks   PT Treatment/Interventions ADLs/Self Care Home Management;Electrical Stimulation;Moist Heat;Therapeutic exercise;Ultrasound;Neuromuscular re-education;Patient/family education;Manual techniques;Dry needling;Passive range of motion   PT Next Visit Plan cont with POC per MD/MRI   Consulted and Agree with Plan of Care  Patient        Problem List Patient Active Problem List   Diagnosis Date Noted  . Left hip pain 01/29/2015  . IT band syndrome 01/29/2015  . Hypogonadism in male 09/12/2014  . Right optic neuritis 08/30/2013  . Erectile dysfunction 06/25/2012  . Gout 06/25/2012    Hermelinda Dellen, PTA 02/19/2015, 8:19 AM  [facility] [address] [place], [place], [address] Phone: [phone]   Fax:  [phone]  Name: Luke Davila MRN: [id number] Date of Birth: 1965-08-03

## 2015-02-19 NOTE — Telephone Encounter (Signed)
Okay to order MRI. U will have to use the ER visit for the diagnosis to get approval.

## 2015-02-19 NOTE — Progress Notes (Signed)
Pt given Testosterone  injection IM RUOQ and tolerated well.

## 2015-02-20 ENCOUNTER — Encounter: Payer: Self-pay | Admitting: Family Medicine

## 2015-02-20 ENCOUNTER — Other Ambulatory Visit: Payer: Self-pay

## 2015-02-20 ENCOUNTER — Ambulatory Visit (INDEPENDENT_AMBULATORY_CARE_PROVIDER_SITE_OTHER): Payer: 59 | Admitting: Family Medicine

## 2015-02-20 VITALS — BP 147/91 | HR 69 | Temp 97.6°F | Ht 75.0 in | Wt 253.0 lb

## 2015-02-20 DIAGNOSIS — M25552 Pain in left hip: Secondary | ICD-10-CM | POA: Diagnosis not present

## 2015-02-20 DIAGNOSIS — E291 Testicular hypofunction: Secondary | ICD-10-CM | POA: Diagnosis not present

## 2015-02-20 MED ORDER — BETAMETHASONE SOD PHOS & ACET 6 (3-3) MG/ML IJ SUSP: 6.0000 mg | Freq: Once | INTRAMUSCULAR | Status: DC

## 2015-02-20 MED ORDER — HYDROCODONE-ACETAMINOPHEN 10-325 MG PO TABS: 1.0000 | ORAL_TABLET | ORAL | Status: DC | PRN

## 2015-02-20 NOTE — Progress Notes (Signed)
Subjective:  Patient ID: Luke Davila, male    DOB: 1965-03-24  Age: 50 y.o. MRN: [id number]  CC: Hip Pain   HPI Luke Davila presents for 8/10 left lateral hip pain. Ongoing for 2 mos. Not responding to NSAID, prednisone and P.T. Getting worse so that ambulation is painful and weak. Almost fell yesterday when left hip gave way. Also had testosterone injection yesterday. Due for peak testosterone level. Chart review verifies 150 mcg given noon yesterday.Marland Kitchen   History Luke Davila has a past medical history of Gout.   He has past surgical history that includes Foot surgery (Left, 1985) and Left hand surgery.   His family history is not on file.He reports that he has never smoked. He has never used smokeless tobacco. He reports that he drinks alcohol. He reports that he does not use illicit drugs.    ROS Review of Systems  Constitutional: Negative for fever, chills and diaphoresis.  HENT: Negative for rhinorrhea and sore throat.   Respiratory: Negative for cough and shortness of breath.   Cardiovascular: Negative for chest pain.  Gastrointestinal: Negative for abdominal pain.  Musculoskeletal: Positive for myalgias and arthralgias.  Skin: Negative for rash.  Neurological: Negative for weakness and headaches.    Objective:  BP 147/91 mmHg  Pulse 69  Temp(Src) 97.6 F (36.4 C) (Oral)  Ht  (1.905 m)  Wt 253 lb (114.76 kg)  BMI 31.62 kg/m2  SpO2 98%  BP Readings from Last 3 Encounters:  02/20/15 147/91  02/06/15 143/81  02/01/15 131/83    Wt Readings from Last 3 Encounters:  02/20/15 253 lb (114.76 kg)  02/06/15 259 lb 6.4 oz (117.663 kg)  02/01/15 250 lb (113.399 kg)     Physical Exam  Constitutional: He appears well-developed and well-nourished.  HENT:  Head: Normocephalic and atraumatic.  Right Ear: Tympanic membrane and external ear normal. No decreased hearing is noted.  Left Ear: Tympanic membrane and external ear normal. No decreased hearing is noted.    Mouth/Throat: No oropharyngeal exudate or posterior oropharyngeal erythema.  Eyes: Pupils are equal, round, and reactive to light.  Neck: Normal range of motion. Neck supple.  Cardiovascular: Normal rate and regular rhythm.   No murmur heard. Pulmonary/Chest: Breath sounds normal. No respiratory distress.  Abdominal: Soft. Bowel sounds are normal. He exhibits no mass. There is no tenderness.  Musculoskeletal: He exhibits tenderness (tendar at greater trochanter. Decreased ROM For flexion, external and dinternal rotation at left hip. Sciatic notch nontender. FROM [facility]).  Vitals reviewed.    Lab Results  Component Value Date   WBC 3.4* 06/20/2014   HGB 12.2* 06/20/2014   HCT 39.3* 06/20/2014   GLUCOSE 67 02/06/2015   CHOL 243* 04/18/2013   TRIG 126 04/18/2013   HDL 58 04/18/2013   LDLCALC 160* 04/18/2013   ALT 16 02/06/2015   AST 15 02/06/2015   NA 138 02/06/2015   K 4.2 02/06/2015   CL 98 02/06/2015   CREATININE 1.20 02/06/2015   BUN 16 02/06/2015   CO2 27 02/06/2015   TSH 1.880 02/06/2015   PSA 0.4 09/15/2013   HGBA1C 5.5 06/20/2014    Dg Femur Min 2 Views Left  02/01/2015  CLINICAL DATA:  Left hip pain for 1 month EXAM: LEFT FEMUR 2 VIEWS COMPARISON:  None FINDINGS: No fracture dislocation. Mild degenerative changes identified in the left hip. The remainder of the left femur is normal as are visualized pelvic bones. IMPRESSION: Mild degenerative changes in the left hip. Electronically Signed  By: Gerome Sam III M.D   On: 02/01/2015 10:50    Assessment & Plan:   Luke Davila was seen today for hip pain.  Diagnoses and all orders for this visit:  Left hip pain  Hypogonadism in male  Other orders -     HYDROcodone-acetaminophen (NORCO) 10-325 MG tablet; Take 1 tablet by mouth every 4 (four) hours as needed for moderate pain or severe pain.   Severe pain not responding to conservative measures. MRI ordered, consider referral to ortho. (No radiculopathy.)  I  have discontinued Mr. Luke Davila's predniSONE and predniSONE. I am also having him start on HYDROcodone-acetaminophen. Additionally, I am having him maintain his tamsulosin, allopurinol, diclofenac, and testosterone cypionate. We will continue to administer testosterone cypionate.  Meds ordered this encounter  Medications  . DISCONTD: predniSONE (DELTASONE) 20 MG tablet    Sig: Take 20 mg by mouth 2 (two) times daily with a meal.   . HYDROcodone-acetaminophen (NORCO) 10-325 MG tablet    Sig: Take 1 tablet by mouth every 4 (four) hours as needed for moderate pain or severe pain.    Dispense:  60 tablet    Refill:  0     Follow-up: Return in about 2 weeks (around 03/06/2015), or if symptoms worsen or fail to improve.  Mechele Claude, M.D.

## 2015-02-20 NOTE — Patient Instructions (Signed)
Resume taking diclofenac twice daily with food.

## 2015-02-20 NOTE — Addendum Note (Signed)
Addended by: Orma Render F on: 02/20/2015 12:13 PM   Modules accepted: Orders

## 2015-02-21 ENCOUNTER — Ambulatory Visit: Payer: 59 | Admitting: *Deleted

## 2015-02-21 DIAGNOSIS — M2560 Stiffness of unspecified joint, not elsewhere classified: Secondary | ICD-10-CM

## 2015-02-21 DIAGNOSIS — M25552 Pain in left hip: Secondary | ICD-10-CM

## 2015-02-21 DIAGNOSIS — R6889 Other general symptoms and signs: Secondary | ICD-10-CM

## 2015-02-21 LAB — CMP14+EGFR
A/G RATIO: 1.9 (ref 1.1–2.5)
ALBUMIN: 4.3 g/dL (ref 3.5–5.5)
ALK PHOS: 108 IU/L (ref 39–117)
ALT: 28 IU/L (ref 0–44)
AST: 15 IU/L (ref 0–40)
BILIRUBIN TOTAL: 0.4 mg/dL (ref 0.0–1.2)
BUN / CREAT RATIO: 11 (ref 9–20)
BUN: 14 mg/dL (ref 6–24)
CHLORIDE: 100 mmol/L (ref 96–106)
CO2: 27 mmol/L (ref 18–29)
Calcium: 9.4 mg/dL (ref 8.7–10.2)
Creatinine, Ser: 1.25 mg/dL (ref 0.76–1.27)
GFR calc non Af Amer: 67 mL/min/{1.73_m2} (ref >59)
GFR, EST AFRICAN AMERICAN: 78 mL/min/{1.73_m2} (ref >59)
GLOBULIN, TOTAL: 2.3 g/dL (ref 1.5–4.5)
Glucose: 92 mg/dL (ref 65–99)
Potassium: 4.3 mmol/L (ref 3.5–5.2)
SODIUM: 141 mmol/L (ref 134–144)
TOTAL PROTEIN: 6.6 g/dL (ref 6.0–8.5)

## 2015-02-21 LAB — TESTOSTERONE,FREE AND TOTAL
TESTOSTERONE: 423 ng/dL (ref 348–1197)
Testosterone, Free: 7.6 pg/mL (ref 6.8–21.5)

## 2015-02-21 NOTE — Therapy (Signed)
[facility] [place], [place], [address] Phone: [phone]   Fax:  [phone]  Physical Therapy Treatment  Patient Details  Name: Luke Davila MRN: [id number] Date of Birth: Mar 25, 1965 Referring Provider: Claretta Fraise, MD  Encounter Date: 02/21/2015      PT End of Session - 02/21/15 1043    Visit Number 4   Number of Visits 12   Date for PT Re-Evaluation 03/27/15   PT Start Time 0815   PT Stop Time 0905   PT Time Calculation (min) 50 min      Past Medical History  Diagnosis Date  . Gout     Past Surgical History  Procedure Laterality Date  . Foot surgery Left 1985  . Left hand surgery      There were no vitals filed for this visit.  Visit Diagnosis:  Left hip pain  Stiffness of joints, multiple sites  Activity intolerance      Subjective Assessment - 02/21/15 0841    Subjective Pain level was so bad yesterday he could not walk and leg was giving out so he went to hospital and they gave him pain shot in arm for pain, patient is to schedule MRI with MD   How long can you stand comfortably? 15 min   How long can you walk comfortably? 15 min   Diagnostic tests xrays of hip and knee    Patient Stated Goals decrease pain, improve movement   Currently in Pain? Yes   Pain Score 8    Pain Location Knee   Pain Orientation Left   Pain Descriptors / Indicators Sore                         [facility] Adult PT Treatment/Exercise - 02/21/15 0001    Exercises   Exercises Knee/Hip   Modalities   Modalities Ultrasound   Moist Heat Therapy   Number Minutes Moist Heat 15 Minutes   Moist Heat Location Hip   Electrical Stimulation   Electrical Stimulation Location L glut med and adductors; L ITB Premod x 15 mins 80-_0    Electrical Stimulation Goals Pain   Ultrasound   Ultrasound Location Lt hip glute,    Ultrasound Parameters 1.5 w/cm2 x10 mins in RT sidelying   Ultrasound Goals Pain   Manual Therapy   Manual Therapy Myofascial release   Myofascial Release IASTW to L ITB and lateral quad  in R sidelying to decrease adhesions and tightness; MFR to L Glut Med to decrease tightness and pain                     PT Long Term Goals - 02/16/15 0912    PT LONG TERM GOAL #1   Title I with HEP   Time 6   Status Achieved   PT LONG TERM GOAL #2   Title Able to ambulate without L hip pain   Time 6   Period Weeks   Status New   PT LONG TERM GOAL #3   Title able to stand without forward trunk lean   Time 6   Period Weeks   Status New   PT LONG TERM GOAL #4   Title able to perform sit to stand without pain   Time 6   Period Weeks   Status New               Plan - 02/21/15 1043    Clinical Impression Statement  Pt is still about the same with pain in LT hip. He thinks PT is helping a little bit, but not curing the problem. He did fairly well with todays Rx and had some relief when leaving. No goals met today due to pain in LT hip    Pt will benefit from skilled therapeutic intervention in order to improve on the following deficits Decreased range of motion;Difficulty walking;Decreased activity tolerance;Pain;Impaired flexibility;Postural dysfunction   Rehab Potential Excellent   PT Frequency 2x / week   PT Duration 6 weeks   PT Treatment/Interventions ADLs/Self Care Home Management;Electrical Stimulation;Moist Heat;Therapeutic exercise;Ultrasound;Neuromuscular re-education;Patient/family education;Manual techniques;Dry needling;Passive range of motion   PT Next Visit Plan cont with POC per MD/MRI     Having MRI on Sunday   PT Home Exercise Plan HS and prone quad stretch with strap; adductors and ITB stretch   Consulted and Agree with Plan of Care Patient        Problem List Patient Active Problem List   Diagnosis Date Noted  . Left hip pain 01/29/2015  . IT band syndrome 01/29/2015  . Hypogonadism in male 09/12/2014  . Right optic neuritis  08/30/2013  . Erectile dysfunction 06/25/2012  . Gout 06/25/2012    RAMSEUR,CHRIS, PTA 02/21/2015, 11:12 AM  [facility] [place], [place], [address] Phone: [phone]   Fax:  [phone]  Name: Luke Davila MRN: [id number] Date of Birth: 1965-02-02

## 2015-02-22 ENCOUNTER — Telehealth: Payer: Self-pay | Admitting: Family Medicine

## 2015-02-22 NOTE — Telephone Encounter (Signed)
He's actually getting physical therapy elsewhere.  He wasn't improving so PCP ordered MRI of his hip.

## 2015-02-23 ENCOUNTER — Telehealth: Payer: Self-pay | Admitting: Family Medicine

## 2015-02-23 NOTE — Telephone Encounter (Signed)
Patient aware and note up front for patient  

## 2015-02-23 NOTE — Telephone Encounter (Signed)
Okay to give note for 2/14- 2/28

## 2015-02-25 ENCOUNTER — Inpatient Hospital Stay: Admission: RE | Admit: 2015-02-25 | Payer: 59 | Source: Ambulatory Visit

## 2015-02-28 ENCOUNTER — Encounter: Payer: Self-pay | Admitting: Physical Therapy

## 2015-02-28 ENCOUNTER — Ambulatory Visit: Payer: 59 | Admitting: Physical Therapy

## 2015-02-28 DIAGNOSIS — M25552 Pain in left hip: Secondary | ICD-10-CM | POA: Diagnosis not present

## 2015-02-28 DIAGNOSIS — R6889 Other general symptoms and signs: Secondary | ICD-10-CM

## 2015-02-28 DIAGNOSIS — M2560 Stiffness of unspecified joint, not elsewhere classified: Secondary | ICD-10-CM

## 2015-02-28 NOTE — Therapy (Signed)
[facility] [address] [place], [place], [address] Phone: [phone]   Fax:  [phone]  Physical Therapy Treatment  Patient Details  Name: Luke Davila MRN: [id number] Date of Birth: [birth year] Referring Provider: Mechele Claude, MD  Encounter Date: 02/28/2015      PT End of Session - 02/28/15 0850    Visit Number 5   Number of Visits 12   Date for PT Re-Evaluation 03/27/15   PT Start Time 0814   PT Stop Time 0900   PT Time Calculation (min) 46 min   Activity Tolerance Patient tolerated treatment well   Behavior During Therapy [facility] for tasks assessed/performed      Past Medical History  Diagnosis Date  . Gout     Past Surgical History  Procedure Laterality Date  . Foot surgery Left 1985  . Left hand surgery      There were no vitals filed for this visit.  Visit Diagnosis:  Left hip pain  Stiffness of joints, multiple sites  Activity intolerance      Subjective Assessment - 02/28/15 0824    Subjective Patient reported less pain today with meds, has not had MRI but it is scheduled for sunday   Limitations Standing;Walking   How long can you stand comfortably? 15 min   How long can you walk comfortably? 15 min   Diagnostic tests xrays of hip and knee    Patient Stated Goals decrease pain, improve movement   Currently in Pain? Yes   Pain Score 3    Pain Location Hip   Pain Orientation Left   Pain Descriptors / Indicators Sore   Pain Type Acute pain   Pain Radiating Towards --  down left lateral hip to knee   Pain Onset More than a month ago   Aggravating Factors  standing   Pain Relieving Factors sitting                         [facility] Adult PT Treatment/Exercise - 02/28/15 0001    Moist Heat Therapy   Number Minutes Moist Heat 15 Minutes   Moist Heat Location Hip   Electrical Stimulation   Electrical Stimulation Location L glut med and adductors; L ITB   Electrical Stimulation Action  premod   Electrical Stimulation Parameters 80-150hz    Electrical Stimulation Goals Pain   Ultrasound   Ultrasound Location lt hip/glut   Ultrasound Parameters 1.5w/cm2/50%/81mhzx10min   Ultrasound Goals Pain   Manual Therapy   Manual Therapy Myofascial release   Myofascial Release IASTW to L ITB and lateral quad  in R sidelying to decrease adhesions and tightness; MFR to L Glut Med to decrease tightness and pain                     PT Long Term Goals - 02/16/15 0912    PT LONG TERM GOAL #1   Title I with HEP   Time 6   Status Achieved   PT LONG TERM GOAL #2   Title Able to ambulate without L hip pain   Time 6   Period Weeks   Status New   PT LONG TERM GOAL #3   Title able to stand without forward trunk lean   Time 6   Period Weeks   Status New   PT LONG TERM GOAL #4   Title able to perform sit to stand without pain   Time 6   Period Weeks  Status New               Plan - 02/28/15 1610    Clinical Impression Statement Patient reported less pain today but is on pain medication. Patient continues to have soreness with prolong standing. His MD. scheduled his MRI for this sunday. Patient unable to meet any further goals due to pain deficits.   Pt will benefit from skilled therapeutic intervention in order to improve on the following deficits Decreased range of motion;Difficulty walking;Decreased activity tolerance;Pain;Impaired flexibility;Postural dysfunction   Rehab Potential Excellent   PT Frequency 2x / week   PT Duration 6 weeks   PT Treatment/Interventions ADLs/Self Care Home Management;Electrical Stimulation;Moist Heat;Therapeutic exercise;Ultrasound;Neuromuscular re-education;Patient/family education;Manual techniques;Dry needling;Passive range of motion   PT Next Visit Plan cont with POC per MD/MRI     Having MRI on Sunday   Consulted and Agree with Plan of Care Patient        Problem List Patient Active Problem List   Diagnosis Date Noted   . Left hip pain 01/29/2015  . IT band syndrome 01/29/2015  . Hypogonadism in male 09/12/2014  . Right optic neuritis 08/30/2013  . Erectile dysfunction 06/25/2012  . Gout 06/25/2012    Hermelinda Dellen, PTA 02/28/2015, 9:00 AM  [facility] [address] [place], [place], [address] Phone: [phone]   Fax:  [phone]  Name: Luke Davila MRN: [id number] Date of Birth: May 27, 1965

## 2015-03-02 ENCOUNTER — Encounter: Payer: 59 | Admitting: *Deleted

## 2015-03-04 ENCOUNTER — Ambulatory Visit
Admission: RE | Admit: 2015-03-04 | Discharge: 2015-03-04 | Disposition: A | Discharge status: Home / Self Care | Payer: 59 | Source: Ambulatory Visit | Attending: Family Medicine | Admitting: Family Medicine

## 2015-03-04 DIAGNOSIS — M25552 Pain in left hip: Secondary | ICD-10-CM

## 2015-03-05 ENCOUNTER — Telehealth: Payer: Self-pay | Admitting: Family Medicine

## 2015-03-06 ENCOUNTER — Encounter: Payer: Self-pay | Admitting: Family Medicine

## 2015-03-06 ENCOUNTER — Telehealth: Payer: Self-pay | Admitting: Family Medicine

## 2015-03-06 ENCOUNTER — Ambulatory Visit (INDEPENDENT_AMBULATORY_CARE_PROVIDER_SITE_OTHER): Payer: 59 | Admitting: *Deleted

## 2015-03-06 ENCOUNTER — Other Ambulatory Visit: Payer: Self-pay | Admitting: Family Medicine

## 2015-03-06 DIAGNOSIS — E291 Testicular hypofunction: Secondary | ICD-10-CM

## 2015-03-06 DIAGNOSIS — M87052 Idiopathic aseptic necrosis of left femur: Secondary | ICD-10-CM

## 2015-03-06 NOTE — Progress Notes (Signed)
Testosterone injection given and patient tolerated well.  

## 2015-03-06 NOTE — Patient Instructions (Signed)

## 2015-03-07 ENCOUNTER — Encounter: Payer: Self-pay | Admitting: *Deleted

## 2015-03-07 NOTE — Telephone Encounter (Signed)
No fmla papers, just a fit for duty and he is not at this time

## 2015-03-13 ENCOUNTER — Telehealth: Payer: Self-pay | Admitting: Family Medicine

## 2015-03-13 NOTE — Telephone Encounter (Signed)
Pt surgery has been scheduled for 04/09/2015; Dr. Deri FuellingAluisio's office will be handling his work notes from this point on

## 2015-03-15 ENCOUNTER — Other Ambulatory Visit: Payer: Self-pay | Admitting: Family Medicine

## 2015-03-19 ENCOUNTER — Encounter: Payer: Self-pay | Admitting: Family Medicine

## 2015-03-19 ENCOUNTER — Ambulatory Visit (INDEPENDENT_AMBULATORY_CARE_PROVIDER_SITE_OTHER): Payer: 59 | Admitting: Family Medicine

## 2015-03-19 VITALS — BP 135/92 | HR 102 | Temp 97.2°F | Ht 75.0 in | Wt 261.0 lb

## 2015-03-19 DIAGNOSIS — N528 Other male erectile dysfunction: Secondary | ICD-10-CM

## 2015-03-19 DIAGNOSIS — E291 Testicular hypofunction: Secondary | ICD-10-CM

## 2015-03-19 DIAGNOSIS — M87052 Idiopathic aseptic necrosis of left femur: Secondary | ICD-10-CM | POA: Diagnosis not present

## 2015-03-19 MED ORDER — HYDROCODONE-ACETAMINOPHEN 10-325 MG PO TABS: 1.0000 | ORAL_TABLET | ORAL | Status: DC | PRN

## 2015-03-19 MED ORDER — TESTOSTERONE CYPIONATE 200 MG/ML IM SOLN: 150.0000 mg | INTRAMUSCULAR | Status: DC

## 2015-03-19 NOTE — Progress Notes (Signed)
Subjective:  Patient ID: Luke Davila, male    DOB: 15-Apr-1965  Age: 50 y.o. MRN: [id number]  CC: 6 week recheck   HPI Luke Davila presents for Scheduled for surgery on aprl 3 for Left hip replacement. MRI below reviewed with pt. Still in pain, using crutches. Due testosterone injection, peak was low normal, trough very low. Will draw trough today.   History [facility] has a past medical history of Gout.   He has past surgical history that includes Foot surgery (Left, 1985) and Left hand surgery.   His family history is not on file.He reports that he has never smoked. He has never used smokeless tobacco. He reports that he drinks alcohol. He reports that he does not use illicit drugs.    ROS Review of Systems  Constitutional: Negative for fever, chills and diaphoresis.  HENT: Negative for rhinorrhea and sore throat.   Respiratory: Negative for cough and shortness of breath.   Cardiovascular: Negative for chest pain.  Gastrointestinal: Negative for abdominal pain.  Musculoskeletal: Positive for myalgias, joint swelling and arthralgias.  Skin: Negative for rash.  Neurological: Negative for weakness and headaches.    Objective:  BP 135/92 mmHg  Pulse 102  Temp(Src) 97.2 F (36.2 C) (Oral)  Ht '6\' 3"'  (1.905 m)  Wt 261 lb (118.389 kg)  BMI 32.62 kg/m2  BP Readings from Last 3 Encounters:  03/19/15 135/92  02/20/15 147/91  02/06/15 143/81    Wt Readings from Last 3 Encounters:  03/19/15 261 lb (118.389 kg)  02/20/15 253 lb (114.76 kg)  02/06/15 259 lb 6.4 oz (117.663 kg)     Physical Exam  Constitutional: He appears well-developed and well-nourished.  HENT:  Head: Normocephalic and atraumatic.  Right Ear: Tympanic membrane and external ear normal. No decreased hearing is noted.  Left Ear: Tympanic membrane and external ear normal. No decreased hearing is noted.  Mouth/Throat: No oropharyngeal exudate or posterior oropharyngeal erythema.  Eyes: Pupils are equal,  round, and reactive to light.  Neck: Normal range of motion. Neck supple.  Cardiovascular: Normal rate and regular rhythm.   No murmur heard. Pulmonary/Chest: Breath sounds normal. No respiratory distress.  Abdominal: Soft. Bowel sounds are normal. He exhibits no mass. There is no tenderness.  Vitals reviewed.    Lab Results  Component Value Date   WBC 3.4* 06/20/2014   HGB 12.2* 06/20/2014   HCT 39.3* 06/20/2014   GLUCOSE 92 02/20/2015   CHOL 243* 04/18/2013   TRIG 126 04/18/2013   HDL 58 04/18/2013   LDLCALC 160* 04/18/2013   ALT 28 02/20/2015   AST 15 02/20/2015   NA 141 02/20/2015   K 4.3 02/20/2015   CL 100 02/20/2015   CREATININE 1.25 02/20/2015   BUN 14 02/20/2015   CO2 27 02/20/2015   TSH 1.880 02/06/2015   PSA 0.4 09/15/2013   HGBA1C 5.5 06/20/2014    Mr Hip Left Wo Contrast  03/04/2015  CLINICAL DATA:  Left hip pain for 2 months.  Avascular necrosis. EXAM: MR OF THE LEFT HIP WITHOUT CONTRAST TECHNIQUE: Multiplanar, multisequence MR imaging was performed. No intravenous contrast was administered. COMPARISON:  02/18/2015 FINDINGS: Bones: Bilateral hip avascular necrosis is present. On the right side this appears chronic and re- vascularized. On the left, there is active involvement and early subcortical collapse for example on image 13 of series 3, and surrounding edema in the left femoral head tracking into the femoral neck. Articular cartilage and labrum Articular cartilage: There is some mild asymmetric chondral  thinning in the left hip joint axially. Labrum:  No gross labral tear or paralabral cyst seen. Joint or bursal effusion Joint effusion:  Small to moderate left hip joint effusion. Bursae:  No regional bursitis. Muscles and tendons Muscles and tendons: There is low-level edema in the proximal left vastus musculature and distal left iliopsoas, likely from secondary inflammation. Low-level edema in the left hip adductor musculature. Other findings Miscellaneous:  Urinary bladder unremarkable for degree of distension. Trace free pelvic fluid, origin and significance uncertain. IMPRESSION: 1. Avascular necrosis of the femoral heads, chronic and re- vascularized on the right but active on the left, with early subcortical fracture/collapse in the involved portion of the left femoral head, and surrounding marrow edema as well as a hip joint effusion. 2. There is some likely secondary inflammation of adjacent muscles including the hip adductor musculature, iliopsoas, and proximal quadriceps musculature. 3. Trace free pelvic fluid, etiology and significance uncertain. Electronically Signed   By: Van Clines M.D.   On: 03/04/2015 15:03    Assessment & Plan:   Luke Davila was seen today for 6 week recheck.  Diagnoses and all orders for this visit:  Hypogonadism in male -     Testosterone,Free and Total; Standing -     CBC with Differential/Platelet; Standing -     CMP14+EGFR; Standing -     Testosterone,Free and Total -     CBC with Differential/Platelet -     CMP14+EGFR -     testosterone cypionate (DEPO-TESTOSTERONE) 200 MG/ML injection; Inject 0.75 mLs (150 mg total) into the muscle every 7 (seven) days.  Avascular necrosis of bone of left hip [facility])  Other male erectile dysfunction  Other orders -     HYDROcodone-acetaminophen (NORCO) 10-325 MG tablet; Take 1 tablet by mouth every 4 (four) hours as needed for moderate pain or severe pain.      I have discontinued Mr. Corker diclofenac. I have also changed his testosterone cypionate. Additionally, I am having him maintain his allopurinol, tamsulosin, and HYDROcodone-acetaminophen. We will continue to administer testosterone cypionate and betamethasone acetate-betamethasone sodium phosphate.  Meds ordered this encounter  Medications  . HYDROcodone-acetaminophen (NORCO) 10-325 MG tablet    Sig: Take 1 tablet by mouth every 4 (four) hours as needed for moderate pain or severe pain.    Dispense:   60 tablet    Refill:  0  . testosterone cypionate (DEPO-TESTOSTERONE) 200 MG/ML injection    Sig: Inject 0.75 mLs (150 mg total) into the muscle every 7 (seven) days.    Dispense:  10 mL    Refill:  3     Follow-up: No Follow-up on file.  Claretta Fraise, M.D.

## 2015-03-20 LAB — CMP14+EGFR
ALK PHOS: 123 IU/L — AB (ref 39–117)
ALT: 30 IU/L (ref 0–44)
AST: 39 IU/L (ref 0–40)
Albumin/Globulin Ratio: 1.7 (ref 1.2–2.2)
Albumin: 4.3 g/dL (ref 3.5–5.5)
BUN/Creatinine Ratio: 5 — ABNORMAL LOW (ref 9–20)
BUN: 6 mg/dL (ref 6–24)
Bilirubin Total: 0.9 mg/dL (ref 0.0–1.2)
CALCIUM: 9.1 mg/dL (ref 8.7–10.2)
CO2: 23 mmol/L (ref 18–29)
CREATININE: 1.29 mg/dL — AB (ref 0.76–1.27)
Chloride: 98 mmol/L (ref 96–106)
GFR calc Af Amer: 75 mL/min/{1.73_m2} (ref >59)
GFR calc non Af Amer: 65 mL/min/{1.73_m2} (ref >59)
GLOBULIN, TOTAL: 2.5 g/dL (ref 1.5–4.5)
GLUCOSE: 79 mg/dL (ref 65–99)
Potassium: 4.3 mmol/L (ref 3.5–5.2)
SODIUM: 138 mmol/L (ref 134–144)
Total Protein: 6.8 g/dL (ref 6.0–8.5)

## 2015-03-20 LAB — CBC WITH DIFFERENTIAL/PLATELET
BASOS ABS: 0 10*3/uL (ref 0.0–0.2)
Basos: 1 %
EOS (ABSOLUTE): 0.1 10*3/uL (ref 0.0–0.4)
EOS: 3 %
Hematocrit: 36.2 % — ABNORMAL LOW (ref 37.5–51.0)
Hemoglobin: 12.3 g/dL — ABNORMAL LOW (ref 12.6–17.7)
IMMATURE GRANS (ABS): 0.1 10*3/uL (ref 0.0–0.1)
Immature Granulocytes: 2 %
LYMPHS: 21 %
Lymphocytes Absolute: 0.8 10*3/uL (ref 0.7–3.1)
MCH: 26.6 pg (ref 26.6–33.0)
MCHC: 34 g/dL (ref 31.5–35.7)
MCV: 78 fL — ABNORMAL LOW (ref 79–97)
MONOCYTES: 19 %
Monocytes Absolute: 0.7 10*3/uL (ref 0.1–0.9)
Neutrophils Absolute: 2.1 10*3/uL (ref 1.4–7.0)
Neutrophils: 54 %
PLATELETS: 293 10*3/uL (ref 150–379)
RBC: 4.63 x10E6/uL (ref 4.14–5.80)
RDW: 15.5 % — AB (ref 12.3–15.4)
WBC: 3.8 10*3/uL (ref 3.4–10.8)

## 2015-03-20 LAB — TESTOSTERONE,FREE AND TOTAL
TESTOSTERONE FREE: 5.1 pg/mL — AB (ref 6.8–21.5)
Testosterone: 366 ng/dL (ref 348–1197)

## 2015-03-24 ENCOUNTER — Telehealth: Payer: Self-pay | Admitting: Family Medicine

## 2015-03-24 NOTE — Telephone Encounter (Signed)
PLEASE ADVISE.

## 2015-03-26 ENCOUNTER — Ambulatory Visit (INDEPENDENT_AMBULATORY_CARE_PROVIDER_SITE_OTHER): Payer: 59 | Admitting: *Deleted

## 2015-03-26 DIAGNOSIS — E291 Testicular hypofunction: Secondary | ICD-10-CM

## 2015-03-26 DIAGNOSIS — E349 Endocrine disorder, unspecified: Secondary | ICD-10-CM

## 2015-03-26 NOTE — Progress Notes (Signed)
Pt given testosterone injection IM LUOQ and tolerated well. °

## 2015-03-27 NOTE — Telephone Encounter (Signed)
Finished

## 2015-03-28 ENCOUNTER — Ambulatory Visit: Payer: Self-pay | Admitting: Orthopedic Surgery

## 2015-03-28 NOTE — Progress Notes (Signed)
Preoperative surgical orders have been place into the Epic hospital system for Luke Davila on 03/28/2015, 10:39 AM  by Patrica DuelPERKINS, Burnard Enis for surgery on 04-09-2015.  Preop Total Hip - Anterior Approach orders including IV Tylenol, and IV Decadron as long as there are no contraindications to the above medications. Avel Peacerew Vlad Mayberry, PA-C

## 2015-04-02 ENCOUNTER — Ambulatory Visit (INDEPENDENT_AMBULATORY_CARE_PROVIDER_SITE_OTHER): Payer: 59 | Admitting: *Deleted

## 2015-04-02 DIAGNOSIS — E291 Testicular hypofunction: Secondary | ICD-10-CM | POA: Diagnosis not present

## 2015-04-02 DIAGNOSIS — E349 Endocrine disorder, unspecified: Secondary | ICD-10-CM

## 2015-04-02 NOTE — Progress Notes (Signed)
Testosterone injection given and patient tolerated well.  

## 2015-04-02 NOTE — Patient Instructions (Signed)

## 2015-04-03 ENCOUNTER — Encounter (HOSPITAL_COMMUNITY)
Admission: RE | Admit: 2015-04-03 | Discharge: 2015-04-03 | Disposition: A | Discharge status: Home / Self Care | Payer: 59 | Source: Ambulatory Visit | Attending: Orthopedic Surgery | Admitting: Orthopedic Surgery

## 2015-04-03 ENCOUNTER — Encounter (HOSPITAL_COMMUNITY): Payer: Self-pay

## 2015-04-03 DIAGNOSIS — M879 Osteonecrosis, unspecified: Secondary | ICD-10-CM | POA: Insufficient documentation

## 2015-04-03 DIAGNOSIS — Z01812 Encounter for preprocedural laboratory examination: Secondary | ICD-10-CM | POA: Diagnosis present

## 2015-04-03 HISTORY — DX: Benign prostatic hyperplasia without lower urinary tract symptoms: N40.0

## 2015-04-03 HISTORY — DX: Idiopathic aseptic necrosis of unspecified femur: M87.059

## 2015-04-03 LAB — SURGICAL PCR SCREEN
MRSA, PCR: NEGATIVE
STAPHYLOCOCCUS AUREUS: POSITIVE — AB

## 2015-04-03 LAB — URINALYSIS, ROUTINE W REFLEX MICROSCOPIC
Bilirubin Urine: NEGATIVE
Glucose, UA: NEGATIVE mg/dL
Hgb urine dipstick: NEGATIVE
KETONES UR: NEGATIVE mg/dL
LEUKOCYTES UA: NEGATIVE
NITRITE: NEGATIVE
PH: 6 (ref 5.0–8.0)
Protein, ur: NEGATIVE mg/dL
SPECIFIC GRAVITY, URINE: 1.01 (ref 1.005–1.030)

## 2015-04-03 LAB — PROTIME-INR
INR: 1.1 (ref 0.00–1.49)
PROTHROMBIN TIME: 14.4 s (ref 11.6–15.2)

## 2015-04-03 LAB — ABO/RH: ABO/RH(D): B POS

## 2015-04-03 LAB — APTT: APTT: 29 s (ref 24–37)

## 2015-04-03 NOTE — Patient Instructions (Addendum)
YOUR PROCEDURE IS SCHEDULED ON : 04/09/15  REPORT TO [facility] MAIN ENTRANCE FOLLOW SIGNS TO EAST ELEVATOR - GO TO 3rd FLOOR CHECK IN AT 3 EAST NURSES STATION (SHORT STAY) AT:  11:30 AM  CALL THIS NUMBER IF YOU HAVE PROBLEMS THE MORNING OF SURGERY [phone]  REMEMBER:ONLY 1 PER PERSON MAY GO TO SHORT STAY WITH YOU TO GET READY THE MORNING OF YOUR SURGERY  DO NOT EAT FOOD  AFTER MIDNIGHT  MAY HAVE CLEAR LIQUIDS UNTIL 8:30 AM  TAKE THESE MEDICINES THE MORNING OF SURGERY: TAMSULOSIN / MAY TAKE HYDROCODONE IF NEEDED  CLEAR LIQUID DIET  Foods Allowed                                                                     Foods Excluded  Coffee and tea, regular and decaf                             liquids that you cannot  Plain Jell-O in any flavor                                             see through such as: Fruit ices (not with fruit pulp)                                     milk, soups, orange juice  Iced Popsicles                                                     All solid food Carbonated beverages, regular and diet                                    Cranberry, grape and apple juices Sports drinks like Gatorade Lightly seasoned clear broth or consume(fat free) Sugar, honey syrup  _____________________________________________________________________    YOU MAY NOT HAVE ANY METAL ON YOUR BODY INCLUDING HAIR PINS AND PIERCING'S. DO NOT WEAR JEWELRY, MAKEUP, LOTIONS, POWDERS OR PERFUMES. DO NOT WEAR NAIL POLISH. DO NOT SHAVE 48 HRS PRIOR TO SURGERY. MEN MAY SHAVE FACE AND NECK.  DO NOT BRING VALUABLES TO HOSPITAL. [facility] IS NOT RESPONSIBLE FOR VALUABLES.  CONTACTS, DENTURES OR PARTIALS MAY NOT BE WORN TO SURGERY. LEAVE SUITCASE IN CAR. CAN BE BROUGHT TO ROOM AFTER SURGERY.  PATIENTS DISCHARGED THE DAY OF SURGERY WILL NOT BE ALLOWED TO DRIVE HOME.  PLEASE READ OVER THE FOLLOWING INSTRUCTION  SHEETS _________________________________________________________________________________                                          [place] - PREPARING FOR SURGERY  Before surgery, you can play an important  role.  Because skin is not sterile, your skin needs to be as free of germs as possible.  You can reduce the number of germs on your skin by washing with CHG (chlorahexidine gluconate) soap before surgery.  CHG is an antiseptic cleaner which kills germs and bonds with the skin to continue killing germs even after washing. Please DO NOT use if you have an allergy to CHG or antibacterial soaps.  If your skin becomes reddened/irritated stop using the CHG and inform your nurse when you arrive at Short Stay. Do not shave (including legs and underarms) for at least 48 hours prior to the first CHG shower.  You may shave your face. Please follow these instructions carefully:   1.  Shower with CHG Soap the night before surgery and the  morning of Surgery.   2.  If you choose to wash your hair, wash your hair first as usual with your  normal  Shampoo.   3.  After you shampoo, rinse your hair and body thoroughly to remove the  shampoo.                                         4.  Use CHG as you would any other liquid soap.  You can apply chg directly  to the skin and wash . Gently wash with scrungie or clean wascloth    5.  Apply the CHG Soap to your body ONLY FROM THE NECK DOWN.   Do not use on open                           Wound or open sores. Avoid contact with eyes, ears mouth and genitals (private parts).                        Genitals (private parts) with your normal soap.              6.  Wash thoroughly, paying special attention to the area where your surgery  will be performed.   7.  Thoroughly rinse your body with warm water from the neck down.   8.  DO NOT shower/wash with your normal soap after using and rinsing off  the CHG Soap .                9.  Pat yourself dry with a clean  towel.             10.  Wear clean night clothes to bed after shower             11.  Place clean sheets on your bed the night of your first shower and do not  sleep with pets.  Day of Surgery : Do not apply any lotions/deodorants the morning of surgery.  Please wear clean clothes to the hospital/surgery center.  FAILURE TO FOLLOW THESE INSTRUCTIONS MAY RESULT IN THE CANCELLATION OF YOUR SURGERY    PATIENT SIGNATURE_________________________________  ______________________________________________________________________     Luke Davila  An incentive spirometer is a tool that can help keep your lungs clear and active. This tool measures how well you are filling your lungs with each breath. Taking long deep breaths may help reverse or decrease the chance of developing breathing (pulmonary) problems (especially infection) following:  A long period of time when you are unable  to move or be active. BEFORE THE PROCEDURE   If the spirometer includes an indicator to show your best effort, your nurse or respiratory therapist will set it to a desired goal.  If possible, sit up straight or lean slightly forward. Try not to slouch.  Hold the incentive spirometer in an upright position. INSTRUCTIONS FOR USE   Sit on the edge of your bed if possible, or sit up as far as you can in bed or on a chair.  Hold the incentive spirometer in an upright position.  Breathe out normally.  Place the mouthpiece in your mouth and seal your lips tightly around it.  Breathe in slowly and as deeply as possible, raising the piston or the ball toward the top of the column.  Hold your breath for 3-5 seconds or for as long as possible. Allow the piston or ball to fall to the bottom of the column.  Remove the mouthpiece from your mouth and breathe out normally.  Rest for a few seconds and repeat Steps 1 through 7 at least 10 times every 1-2 hours when you are awake. Take your time and take a few  normal breaths between deep breaths.  The spirometer may include an indicator to show your best effort. Use the indicator as a goal to work toward during each repetition.  After each set of 10 deep breaths, practice coughing to be sure your lungs are clear. If you have an incision (the cut made at the time of surgery), support your incision when coughing by placing a pillow or rolled up towels firmly against it. Once you are able to get out of bed, walk around indoors and cough well. You may stop using the incentive spirometer when instructed by your caregiver.  RISKS AND COMPLICATIONS  Take your time so you do not get dizzy or light-headed.  If you are in pain, you may need to take or ask for pain medication before doing incentive spirometry. It is harder to take a deep breath if you are having pain. AFTER USE  Rest and breathe slowly and easily.  It can be helpful to keep track of a log of your progress. Your caregiver can provide you with a simple table to help with this. If you are using the spirometer at home, follow these instructions: [place] IF:   You are having difficultly using the spirometer.  You have trouble using the spirometer as often as instructed.  Your pain medication is not giving enough relief while using the spirometer.  You develop fever of 100.5 F (38.1 C) or higher. SEEK IMMEDIATE MEDICAL CARE IF:   You cough up bloody sputum that had not been present before.  You develop fever of 102 F (38.9 C) or greater.  You develop worsening pain at or near the incision site. MAKE SURE YOU:   Understand these instructions.  Will watch your condition.  Will get help right away if you are not doing well or get worse. Document Released: 05/05/2006 Document Revised: 03/17/2011 Document Reviewed: 07/06/2006 Exit[employer] Patient Information 2014 Exit[employer], [place].   ________________________________________________________________________  WHAT IS A BLOOD  TRANSFUSION? Blood Transfusion Information  A transfusion is the replacement of blood or some of its parts. Blood is made up of multiple cells which provide different functions.  Red blood cells carry oxygen and are used for blood loss replacement.  White blood cells fight against infection.  Platelets control bleeding.  Plasma helps clot blood.  Other blood products are  available for specialized needs, such as hemophilia or other clotting disorders. BEFORE THE TRANSFUSION  Who gives blood for transfusions?   Healthy volunteers who are fully evaluated to make sure their blood is safe. This is blood bank blood. Transfusion therapy is the safest it has ever been in the practice of medicine. Before blood is taken from a donor, a complete history is taken to make sure that person has no history of diseases nor engages in risky social behavior (examples are intravenous drug use or sexual activity with multiple partners). The donor's travel history is screened to minimize risk of transmitting infections, such as malaria. The donated blood is tested for signs of infectious diseases, such as HIV and hepatitis. The blood is then tested to be sure it is compatible with you in order to minimize the chance of a transfusion reaction. If you or a relative donates blood, this is often done in anticipation of surgery and is not appropriate for emergency situations. It takes many days to process the donated blood. RISKS AND COMPLICATIONS Although transfusion therapy is very safe and saves many lives, the main dangers of transfusion include:   Getting an infectious disease.  Developing a transfusion reaction. This is an allergic reaction to something in the blood you were given. Every precaution is taken to prevent this. The decision to have a blood transfusion has been considered carefully by your caregiver before blood is given. Blood is not given unless the benefits outweigh the risks. AFTER THE  TRANSFUSION  Right after receiving a blood transfusion, you will usually feel much better and more energetic. This is especially true if your red blood cells have gotten low (anemic). The transfusion raises the level of the red blood cells which carry oxygen, and this usually causes an energy increase.  The nurse administering the transfusion will monitor you carefully for complications. HOME CARE INSTRUCTIONS  No special instructions are needed after a transfusion. You may find your energy is better. Speak with your caregiver about any limitations on activity for underlying diseases you may have. SEEK MEDICAL CARE IF:   Your condition is not improving after your transfusion.  You develop redness or irritation at the intravenous (IV) site. SEEK IMMEDIATE MEDICAL CARE IF:  Any of the following symptoms occur over the next 12 hours:  Shaking chills.  You have a temperature by mouth above 102 F (38.9 C), not controlled by medicine.  Chest, back, or muscle pain.  People around you feel you are not acting correctly or are confused.  Shortness of breath or difficulty breathing.  Dizziness and fainting.  You get a rash or develop hives.  You have a decrease in urine output.  Your urine turns a dark color or changes to pink, red, or brown. Any of the following symptoms occur over the next 10 days:  You have a temperature by mouth above 102 F (38.9 C), not controlled by medicine.  Shortness of breath.  Weakness after normal activity.  The white part of the eye turns yellow (jaundice).  You have a decrease in the amount of urine or are urinating less often.  Your urine turns a dark color or changes to pink, red, or brown. Document Released: 12/21/1999 Document Revised: 03/17/2011 Document Reviewed: 08/09/2007 [facility] Patient Information 2014 [place], [place].  _______________________________________________________________________

## 2015-04-04 NOTE — Progress Notes (Signed)
PT NOTIFIED OF SURGERY TIME CHANGE TO 3:05 PM - INSTRUCTED TO ARRIVE AT 12:00 PM CLEAR LIQUIDS UNTIL 9:00 AM - THEN NPO  PT ALSO NOTIFIED OF + NASAL SWAB - RX MUPURICIN CALLED TO [facility] [place]

## 2015-04-07 ENCOUNTER — Ambulatory Visit: Payer: Self-pay | Admitting: Orthopedic Surgery

## 2015-04-07 NOTE — H&P (Signed)
Luke Davila DOB: August 08, 1965 Married / Language: English / Race: Black or African American Male Date of Admission:  04/09/2015 CC:  Left Hip pain History of Present Illness  The patient is a 50 year old male who comes in for a preoperative History and Physical. The patient is scheduled for a left total hip arthroplasty (anterior) to be performed by Dr. Gus Rankin. Aluisio, MD at [facility] on 04-09-2015. The patient is a 50 year old male who presents with a hip problem. The patient reports left hip problems including pain symptoms that have been present for 3 month(s). The symptoms began without any known injury. The patient reports symptoms radiating to the: left thigh.The patient feels as if their symptoms are does feel they are worsening. Symptoms are exacerbated by weight bearing, walking and sitting. Current treatment includes oral corticosteroids and opioid analgesics (Hydrocodone). Prior to being seen today the patient was previously evaluated by a primary physician (patient has also been to the emergency room). Previous workup for this problem has included pelvic x-rays, hip x-rays and hip MRI. For the past three or four months, he has had significant pain in his left hip. He does not recall a specific injury leading to this. He recently had x-rays and MRI, was told he has avascular necrosis of the hip. His risk factors do include a long history of drinking approximately six beers per day and also he was on high dose corticosteroids last year due to a visual disturbance. He is currently off the steroids now, but has had a few dose packs here in the last two or three months. Unfortunately, the hip pain is severe. It is in his groin. It radiates to his knee. He is not having any lower extremity weakness or paresthesia with this. He was seen for evaluation as his MRI did show collapse of his femoral head. Due to the nature of the condition of his hip, it is felt that he would benefit form  undergoing total hip replacement. He is ready to proceed with surgery. They have been treated conservatively in the past for the above stated problem and despite conservative measures, they continue to have progressive pain and severe functional limitations and dysfunction. They have failed non-operative management including home exercise, medications, and steroids. It is felt that they would benefit from undergoing total joint replacement. Risks and benefits of the procedure have been discussed with the patient and they elect to proceed with surgery. There are no active contraindications to surgery such as ongoing infection or rapidly progressive neurological disease.  Problem List/Past Medical Leg pain (M79.606)  Low back pain (M54.5)  Poor flexibility of tendon (M67.80)  Avascular necrosis of bone of left hip (M87.052)  Weakness of trunk musculature (M62.81)  Gout  Allergies No Known Drug Allergies   Family History Hypertension  Mother.  Social History Tobacco use  Never smoker. 01/24/2013 Children  2 Current drinker  01/24/2013: Currently drinks beer 8-14 times per week Current work status  working full time Exercise  Exercises rarely; does running / walking Living situation  live with spouse Marital status  married No history of drug/alcohol rehab  Not under pain contract  Number of flights of stairs before winded  greater than 5 Tobacco / smoke exposure  01/24/2013: no  Medication History Tamsulosin HCl (0.4MG  Capsule, Oral) Active.  Past Surgical History Foot Surgery  left  Review of Systems General Not Present- Chills, Fatigue, Fever, Memory Loss, Night Sweats, Weight Gain and Weight Loss.  Skin Not Present- Eczema, Hives, Itching, Lesions and Rash. HEENT Present- Blurred Vision. Not Present- Dentures, Double Vision, Headache, Hearing Loss, Tinnitus and Visual Loss. Respiratory Not Present- Allergies, Chronic Cough, Coughing up blood, Shortness of  breath at rest and Shortness of breath with exertion. Cardiovascular Not Present- Chest Pain, Difficulty Breathing Lying Down, Murmur, Palpitations, Racing/skipping heartbeats and Swelling. Gastrointestinal Not Present- Abdominal Pain, Bloody Stool, Constipation, Diarrhea, Difficulty Swallowing, Heartburn, Jaundice, Loss of appetitie, Nausea and Vomiting. Male Genitourinary Not Present- Blood in Urine, Discharge, Flank Pain, Incontinence, Painful Urination, Urgency, Urinary frequency, Urinary Retention, Urinating at Night and Weak urinary stream. Musculoskeletal Present- Joint Pain. Not Present- Back Pain, Joint Swelling, Morning Stiffness, Muscle Pain, Muscle Weakness and Spasms. Neurological Not Present- Blackout spells, Difficulty with balance, Dizziness, Paralysis, Tremor and Weakness. Psychiatric Not Present- Insomnia.  Vitals Weight: 260 lb Height: 75in Body Surface Area: 2.45 m Body Mass Index: 32.5 kg/m  Pulse: 88 (Regular)  BP: 118/70 (Sitting, Right Arm, Standard  Physical Exam General Mental Status -Alert, cooperative and good historian. General Appearance-pleasant, Not in acute distress. Orientation-Oriented X3. Build & Nutrition-Well nourished and Well developed.  Head and Neck Head-normocephalic, atraumatic . Neck Global Assessment - supple, no bruit auscultated on the right, no bruit auscultated on the left.  Eye Pupil - Bilateral-Regular and Round. Motion - Bilateral-EOMI.  Chest and Lung Exam Auscultation Breath sounds - clear at anterior chest wall and clear at posterior chest wall. Adventitious sounds - No Adventitious sounds.  Cardiovascular Auscultation Rhythm - Regular rate and rhythm. Heart Sounds - S1 WNL and S2 WNL. Murmurs & Other Heart Sounds - Auscultation of the heart reveals - No Murmurs.  Abdomen Palpation/Percussion Tenderness - Abdomen is non-tender to palpation. Rigidity (guarding) - Abdomen is  soft. Auscultation Auscultation of the abdomen reveals - Bowel sounds normal.  Male Genitourinary Note: Not done, not pertinent to present illness   Musculoskeletal Note: A well developed male, alert and oriented, in no apparent distress. Evaluation of his right hip, normal range of motion, no discomfort. Left hip flexion to about 110, rotation in 20, out 30, abduction 30 with discomfort on range of motion. He does not have any mechanical pain. Specifically, there is no popping or catching on range of motion.  IMAGING Plain radiographs, AP pelvis and lateral of the left hip demonstrate that he has significant osteonecrosis with involvement of over 50% of femoral head and now with an area of subchondral collapse. His MRI scan shows involvement of about 75% of the left femoral head with an area of collapse. His right hip has osteonecrosis also, but no collapse associated with it.  Assessment & Plan Avascular necrosis of bone of left hip (M87.052)  Note:Surgical Plans: Left Total Hip Replacement - Anterior Approach  Disposition: Home  PCP: Dr. Darlyn ReadStacks  IV TXA  Anesthesia Issues: None  Signed electronically by Beckey RutterAlezandrew L Perkins, III PA-C

## 2015-04-09 ENCOUNTER — Inpatient Hospital Stay (HOSPITAL_COMMUNITY): Payer: 59

## 2015-04-09 ENCOUNTER — Inpatient Hospital Stay (HOSPITAL_COMMUNITY)
Admission: RE | Admit: 2015-04-09 | Discharge: 2015-04-11 | DRG: 470 | Disposition: A | Discharge status: Home Health | Payer: 59 | Source: Ambulatory Visit | Attending: Orthopedic Surgery | Admitting: Orthopedic Surgery

## 2015-04-09 ENCOUNTER — Encounter (HOSPITAL_COMMUNITY): Payer: Self-pay | Admitting: *Deleted

## 2015-04-09 ENCOUNTER — Inpatient Hospital Stay (HOSPITAL_COMMUNITY): Payer: 59 | Admitting: Certified Registered Nurse Anesthetist

## 2015-04-09 ENCOUNTER — Encounter (HOSPITAL_COMMUNITY)
Admission: RE | Disposition: A | Discharge status: Home Health | Payer: Self-pay | Source: Ambulatory Visit | Attending: Orthopedic Surgery

## 2015-04-09 DIAGNOSIS — Z01812 Encounter for preprocedural laboratory examination: Secondary | ICD-10-CM

## 2015-04-09 DIAGNOSIS — M87052 Idiopathic aseptic necrosis of left femur: Principal | ICD-10-CM | POA: Diagnosis present

## 2015-04-09 DIAGNOSIS — Z96649 Presence of unspecified artificial hip joint: Secondary | ICD-10-CM

## 2015-04-09 DIAGNOSIS — M25552 Pain in left hip: Secondary | ICD-10-CM | POA: Diagnosis present

## 2015-04-09 DIAGNOSIS — M169 Osteoarthritis of hip, unspecified: Secondary | ICD-10-CM | POA: Diagnosis present

## 2015-04-09 HISTORY — PX: TOTAL HIP ARTHROPLASTY: SHX124

## 2015-04-09 LAB — TYPE AND SCREEN
ABO/RH(D): B POS
Antibody Screen: NEGATIVE

## 2015-04-09 SURGERY — ARTHROPLASTY, HIP, TOTAL, ANTERIOR APPROACH
Anesthesia: Spinal | Site: Hip | Laterality: Left

## 2015-04-09 MED ORDER — CEFAZOLIN SODIUM-DEXTROSE 2-4 GM/100ML-% IV SOLN
2.0000 g | INTRAVENOUS | Status: AC
  Administered 2015-04-09: 2 g via INTRAVENOUS
  Filled 2015-04-09: qty 100

## 2015-04-09 MED ORDER — ACETAMINOPHEN 650 MG RE SUPP: 650.0000 mg | Freq: Four times a day (QID) | RECTAL | Status: DC | PRN

## 2015-04-09 MED ORDER — PROPOFOL 10 MG/ML IV BOLUS
INTRAVENOUS | Status: AC
  Filled 2015-04-09: qty 20

## 2015-04-09 MED ORDER — EPHEDRINE SULFATE 50 MG/ML IJ SOLN
INTRAMUSCULAR | Status: DC | PRN
  Administered 2015-04-09: 5 mg via INTRAVENOUS

## 2015-04-09 MED ORDER — FLEET ENEMA 7-19 GM/118ML RE ENEM: 1.0000 | ENEMA | Freq: Once | RECTAL | Status: DC | PRN

## 2015-04-09 MED ORDER — ACETAMINOPHEN 500 MG PO TABS
1000.0000 mg | ORAL_TABLET | Freq: Four times a day (QID) | ORAL | Status: AC
  Administered 2015-04-09 – 2015-04-10 (×4): 1000 mg via ORAL
  Filled 2015-04-09 (×5): qty 2

## 2015-04-09 MED ORDER — SODIUM CHLORIDE 0.9 % IV SOLN
INTRAVENOUS | Status: DC
  Administered 2015-04-10: 01:00:00 via INTRAVENOUS

## 2015-04-09 MED ORDER — MENTHOL 3 MG MT LOZG: 1.0000 | LOZENGE | OROMUCOSAL | Status: DC | PRN

## 2015-04-09 MED ORDER — TAMSULOSIN HCL 0.4 MG PO CAPS
0.4000 mg | ORAL_CAPSULE | Freq: Every day | ORAL | Status: DC
  Administered 2015-04-10 – 2015-04-11 (×2): 0.4 mg via ORAL
  Filled 2015-04-09 (×4): qty 1

## 2015-04-09 MED ORDER — LACTATED RINGERS IV SOLN
INTRAVENOUS | Status: DC | PRN
  Administered 2015-04-09: 14:00:00 via INTRAVENOUS

## 2015-04-09 MED ORDER — ACETAMINOPHEN 325 MG PO TABS: 650.0000 mg | ORAL_TABLET | Freq: Four times a day (QID) | ORAL | Status: DC | PRN

## 2015-04-09 MED ORDER — METHOCARBAMOL 1000 MG/10ML IJ SOLN
500.0000 mg | Freq: Four times a day (QID) | INTRAVENOUS | Status: DC | PRN
  Administered 2015-04-09: 500 mg via INTRAVENOUS
  Filled 2015-04-09 (×2): qty 5

## 2015-04-09 MED ORDER — PHENOL 1.4 % MT LIQD: 1.0000 | OROMUCOSAL | Status: DC | PRN

## 2015-04-09 MED ORDER — TRAMADOL HCL 50 MG PO TABS: 50.0000 mg | ORAL_TABLET | Freq: Four times a day (QID) | ORAL | Status: DC | PRN

## 2015-04-09 MED ORDER — METOCLOPRAMIDE HCL 10 MG PO TABS: 5.0000 mg | ORAL_TABLET | Freq: Three times a day (TID) | ORAL | Status: DC | PRN

## 2015-04-09 MED ORDER — DEXAMETHASONE SODIUM PHOSPHATE 10 MG/ML IJ SOLN
10.0000 mg | Freq: Once | INTRAMUSCULAR | Status: AC
  Administered 2015-04-10: 10 mg via INTRAVENOUS
  Filled 2015-04-09 (×2): qty 1

## 2015-04-09 MED ORDER — BISACODYL 10 MG RE SUPP: 10.0000 mg | Freq: Every day | RECTAL | Status: DC | PRN

## 2015-04-09 MED ORDER — BUPIVACAINE HCL (PF) 0.25 % IJ SOLN
INTRAMUSCULAR | Status: AC
  Filled 2015-04-09: qty 30

## 2015-04-09 MED ORDER — POLYETHYLENE GLYCOL 3350 17 G PO PACK: 17.0000 g | PACK | Freq: Every day | ORAL | Status: DC | PRN

## 2015-04-09 MED ORDER — CEFAZOLIN SODIUM-DEXTROSE 2-4 GM/100ML-% IV SOLN
2.0000 g | Freq: Four times a day (QID) | INTRAVENOUS | Status: AC
  Administered 2015-04-09 – 2015-04-10 (×2): 2 g via INTRAVENOUS
  Filled 2015-04-09 (×2): qty 100

## 2015-04-09 MED ORDER — MIDAZOLAM HCL 5 MG/5ML IJ SOLN
INTRAMUSCULAR | Status: DC | PRN
  Administered 2015-04-09 (×2): 1 mg via INTRAVENOUS

## 2015-04-09 MED ORDER — OXYCODONE HCL 5 MG PO TABS
5.0000 mg | ORAL_TABLET | ORAL | Status: DC | PRN
  Administered 2015-04-09: 5 mg via ORAL
  Administered 2015-04-09 – 2015-04-11 (×13): 10 mg via ORAL
  Filled 2015-04-09 (×4): qty 2
  Filled 2015-04-09: qty 1
  Filled 2015-04-09 (×7): qty 2
  Filled 2015-04-09: qty 1
  Filled 2015-04-09 (×2): qty 2

## 2015-04-09 MED ORDER — METHOCARBAMOL 500 MG PO TABS
500.0000 mg | ORAL_TABLET | Freq: Four times a day (QID) | ORAL | Status: DC | PRN
  Administered 2015-04-10 – 2015-04-11 (×5): 500 mg via ORAL
  Filled 2015-04-09 (×5): qty 1

## 2015-04-09 MED ORDER — 0.9 % SODIUM CHLORIDE (POUR BTL) OPTIME
TOPICAL | Status: DC | PRN
  Administered 2015-04-09: 1000 mL

## 2015-04-09 MED ORDER — BUPIVACAINE HCL (PF) 0.25 % IJ SOLN
INTRAMUSCULAR | Status: DC | PRN
  Administered 2015-04-09: 30 mL

## 2015-04-09 MED ORDER — DEXAMETHASONE SODIUM PHOSPHATE 10 MG/ML IJ SOLN
INTRAMUSCULAR | Status: AC
  Filled 2015-04-09: qty 1

## 2015-04-09 MED ORDER — OXYCODONE HCL 5 MG PO TABS: 5.0000 mg | ORAL_TABLET | Freq: Once | ORAL | Status: DC | PRN

## 2015-04-09 MED ORDER — TRANEXAMIC ACID 1000 MG/10ML IV SOLN
1000.0000 mg | Freq: Once | INTRAVENOUS | Status: AC
  Administered 2015-04-09: 1000 mg via INTRAVENOUS
  Filled 2015-04-09: qty 10

## 2015-04-09 MED ORDER — METOCLOPRAMIDE HCL 5 MG/ML IJ SOLN: 5.0000 mg | Freq: Three times a day (TID) | INTRAMUSCULAR | Status: DC | PRN

## 2015-04-09 MED ORDER — BUPIVACAINE IN DEXTROSE 0.75-8.25 % IT SOLN
INTRATHECAL | Status: DC | PRN
  Administered 2015-04-09: 2 mL via INTRATHECAL

## 2015-04-09 MED ORDER — PROPOFOL 500 MG/50ML IV EMUL
INTRAVENOUS | Status: DC | PRN
  Administered 2015-04-09: 25 ug/kg/min via INTRAVENOUS

## 2015-04-09 MED ORDER — DOCUSATE SODIUM 100 MG PO CAPS
100.0000 mg | ORAL_CAPSULE | Freq: Two times a day (BID) | ORAL | Status: DC
  Administered 2015-04-09 – 2015-04-11 (×4): 100 mg via ORAL

## 2015-04-09 MED ORDER — ONDANSETRON HCL 4 MG/2ML IJ SOLN
INTRAMUSCULAR | Status: DC | PRN
  Administered 2015-04-09: 4 mg via INTRAVENOUS

## 2015-04-09 MED ORDER — PHENYLEPHRINE HCL 10 MG/ML IJ SOLN
INTRAMUSCULAR | Status: AC
  Filled 2015-04-09: qty 1

## 2015-04-09 MED ORDER — OXYCODONE HCL 5 MG/5ML PO SOLN: 5.0000 mg | Freq: Once | ORAL | Status: DC | PRN

## 2015-04-09 MED ORDER — ONDANSETRON HCL 4 MG/2ML IJ SOLN: 4.0000 mg | Freq: Four times a day (QID) | INTRAMUSCULAR | Status: DC | PRN

## 2015-04-09 MED ORDER — LACTATED RINGERS IV SOLN: INTRAVENOUS | Status: DC

## 2015-04-09 MED ORDER — CEFAZOLIN SODIUM-DEXTROSE 2-3 GM-% IV SOLR
INTRAVENOUS | Status: AC
  Filled 2015-04-09: qty 50

## 2015-04-09 MED ORDER — ACETAMINOPHEN 10 MG/ML IV SOLN
1000.0000 mg | Freq: Once | INTRAVENOUS | Status: AC
  Administered 2015-04-09: 1000 mg via INTRAVENOUS
  Filled 2015-04-09: qty 100

## 2015-04-09 MED ORDER — ACETAMINOPHEN 160 MG/5ML PO SOLN: 325.0000 mg | ORAL | Status: DC | PRN

## 2015-04-09 MED ORDER — RIVAROXABAN 10 MG PO TABS
10.0000 mg | ORAL_TABLET | Freq: Every day | ORAL | Status: DC
  Administered 2015-04-10 – 2015-04-11 (×2): 10 mg via ORAL
  Filled 2015-04-09 (×4): qty 1

## 2015-04-09 MED ORDER — PHENYLEPHRINE HCL 10 MG/ML IJ SOLN
INTRAMUSCULAR | Status: DC | PRN
Start: 2015-04-09 — End: 2015-04-09
  Administered 2015-04-09 (×5): 80 ug via INTRAVENOUS

## 2015-04-09 MED ORDER — MEPERIDINE HCL 50 MG/ML IJ SOLN: 6.2500 mg | INTRAMUSCULAR | Status: DC | PRN

## 2015-04-09 MED ORDER — DIPHENHYDRAMINE HCL 12.5 MG/5ML PO ELIX: 12.5000 mg | ORAL_SOLUTION | ORAL | Status: DC | PRN

## 2015-04-09 MED ORDER — ONDANSETRON HCL 4 MG PO TABS: 4.0000 mg | ORAL_TABLET | Freq: Four times a day (QID) | ORAL | Status: DC | PRN

## 2015-04-09 MED ORDER — HYDROMORPHONE HCL 1 MG/ML IJ SOLN
INTRAMUSCULAR | Status: AC
  Filled 2015-04-09: qty 1

## 2015-04-09 MED ORDER — PROPOFOL 10 MG/ML IV BOLUS
INTRAVENOUS | Status: AC
  Filled 2015-04-09: qty 40

## 2015-04-09 MED ORDER — HYDROMORPHONE HCL 1 MG/ML IJ SOLN: 0.2500 mg | INTRAMUSCULAR | Status: DC | PRN

## 2015-04-09 MED ORDER — ONDANSETRON HCL 4 MG/2ML IJ SOLN
INTRAMUSCULAR | Status: AC
  Filled 2015-04-09: qty 2

## 2015-04-09 MED ORDER — PHENYLEPHRINE 40 MCG/ML (10ML) SYRINGE FOR IV PUSH (FOR BLOOD PRESSURE SUPPORT)
PREFILLED_SYRINGE | INTRAVENOUS | Status: AC
  Filled 2015-04-09: qty 10

## 2015-04-09 MED ORDER — SODIUM CHLORIDE 0.9 % IV SOLN
INTRAVENOUS | Status: DC
  Administered 2015-04-09: 1000 mL via INTRAVENOUS

## 2015-04-09 MED ORDER — LIDOCAINE HCL (CARDIAC) 20 MG/ML IV SOLN
INTRAVENOUS | Status: DC | PRN
  Administered 2015-04-09: 60 mg via INTRAVENOUS

## 2015-04-09 MED ORDER — ACETAMINOPHEN 325 MG PO TABS: 325.0000 mg | ORAL_TABLET | ORAL | Status: DC | PRN

## 2015-04-09 MED ORDER — FENTANYL CITRATE (PF) 100 MCG/2ML IJ SOLN
INTRAMUSCULAR | Status: DC | PRN
  Administered 2015-04-09 (×2): 50 ug via INTRAVENOUS

## 2015-04-09 MED ORDER — CHLORHEXIDINE GLUCONATE 4 % EX LIQD
60.0000 mL | Freq: Once | CUTANEOUS | Status: DC
Start: 2015-04-09 — End: 2015-04-09

## 2015-04-09 MED ORDER — DEXAMETHASONE SODIUM PHOSPHATE 10 MG/ML IJ SOLN
10.0000 mg | Freq: Once | INTRAMUSCULAR | Status: AC
  Administered 2015-04-09: 10 mg via INTRAVENOUS

## 2015-04-09 MED ORDER — TRANEXAMIC ACID 1000 MG/10ML IV SOLN
1000.0000 mg | INTRAVENOUS | Status: AC
  Administered 2015-04-09: 1000 mg via INTRAVENOUS
  Filled 2015-04-09: qty 10

## 2015-04-09 MED ORDER — MIDAZOLAM HCL 2 MG/2ML IJ SOLN
INTRAMUSCULAR | Status: AC
  Filled 2015-04-09: qty 2

## 2015-04-09 MED ORDER — MORPHINE SULFATE (PF) 2 MG/ML IV SOLN
1.0000 mg | INTRAVENOUS | Status: DC | PRN
  Administered 2015-04-09 – 2015-04-10 (×4): 1 mg via INTRAVENOUS
  Filled 2015-04-09 (×4): qty 1

## 2015-04-09 MED ORDER — ACETAMINOPHEN 10 MG/ML IV SOLN
INTRAVENOUS | Status: AC
  Filled 2015-04-09: qty 100

## 2015-04-09 MED ORDER — HYDROMORPHONE HCL 1 MG/ML IJ SOLN
0.2500 mg | INTRAMUSCULAR | Status: DC | PRN
  Administered 2015-04-09: 0.5 mg via INTRAVENOUS

## 2015-04-09 MED ORDER — PHENYLEPHRINE HCL 10 MG/ML IJ SOLN
10.0000 mg | INTRAVENOUS | Status: DC | PRN
  Administered 2015-04-09: 50 ug/min via INTRAVENOUS

## 2015-04-09 MED ORDER — FENTANYL CITRATE (PF) 100 MCG/2ML IJ SOLN
INTRAMUSCULAR | Status: AC
  Filled 2015-04-09: qty 2

## 2015-04-09 SURGICAL SUPPLY — 33 items
BAG DECANTER FOR FLEXI CONT (MISCELLANEOUS) ×2 IMPLANT
BAG ZIPLOCK 12X15 (MISCELLANEOUS) IMPLANT
BLADE SAG 18X100X1.27 (BLADE) ×2 IMPLANT
CAPT HIP TOTAL 2 ×2 IMPLANT
CLOTH BEACON ORANGE TIMEOUT ST (SAFETY) ×2 IMPLANT
COVER PERINEAL POST (MISCELLANEOUS) ×2 IMPLANT
DECANTER SPIKE VIAL GLASS SM (MISCELLANEOUS) IMPLANT
DRAPE STERI IOBAN 125X83 (DRAPES) ×2 IMPLANT
DRAPE U-SHAPE 47X51 STRL (DRAPES) ×4 IMPLANT
DRSG ADAPTIC 3X8 NADH LF (GAUZE/BANDAGES/DRESSINGS) ×2 IMPLANT
DRSG MEPILEX BORDER 4X4 (GAUZE/BANDAGES/DRESSINGS) ×2 IMPLANT
DRSG MEPILEX BORDER 4X8 (GAUZE/BANDAGES/DRESSINGS) ×2 IMPLANT
DURAPREP 26ML APPLICATOR (WOUND CARE) ×2 IMPLANT
ELECT REM PT RETURN 9FT ADLT (ELECTROSURGICAL) ×2
ELECTRODE REM PT RTRN 9FT ADLT (ELECTROSURGICAL) ×1 IMPLANT
EVACUATOR 1/8 PVC DRAIN (DRAIN) ×2 IMPLANT
GLOVE BIO SURGEON STRL SZ7.5 (GLOVE) ×6 IMPLANT
GLOVE BIO SURGEON STRL SZ8 (GLOVE) ×4 IMPLANT
GLOVE BIOGEL PI IND STRL 8 (GLOVE) ×5 IMPLANT
GLOVE BIOGEL PI INDICATOR 8 (GLOVE) ×5
GOWN STRL REUS W/TWL LRG LVL3 (GOWN DISPOSABLE) ×8 IMPLANT
GOWN STRL REUS W/TWL XL LVL3 (GOWN DISPOSABLE) ×2 IMPLANT
PACK ANTERIOR HIP CUSTOM (KITS) ×2 IMPLANT
STRIP CLOSURE SKIN 1/2X4 (GAUZE/BANDAGES/DRESSINGS) ×2 IMPLANT
SUT ETHIBOND NAB CT1 #1 30IN (SUTURE) ×2 IMPLANT
SUT MNCRL AB 4-0 PS2 18 (SUTURE) ×2 IMPLANT
SUT VIC AB 2-0 CT1 27 (SUTURE) ×2
SUT VIC AB 2-0 CT1 TAPERPNT 27 (SUTURE) ×2 IMPLANT
SUT VLOC 180 0 24IN GS25 (SUTURE) ×2 IMPLANT
SYR 50ML LL SCALE MARK (SYRINGE) IMPLANT
TRAY FOLEY W/METER SILVER 14FR (SET/KITS/TRAYS/PACK) IMPLANT
TRAY FOLEY W/METER SILVER 16FR (SET/KITS/TRAYS/PACK) ×2 IMPLANT
YANKAUER SUCT BULB TIP 10FT TU (MISCELLANEOUS) ×2 IMPLANT

## 2015-04-09 NOTE — Interval H&P Note (Signed)
History and Physical Interval Note:  04/09/2015 2:35 PM  Luke RoseSamuel Davila  has presented today for surgery, with the diagnosis of AVASULAR NECROSIS LEFT HIP   The various methods of treatment have been discussed with the patient and family. After consideration of risks, benefits and other options for treatment, the patient has consented to  Procedure(s): LEFT TOTAL HIP ARTHROPLASTY ANTERIOR APPROACH (Left) as a surgical intervention .  The patient's history has been reviewed, patient examined, no change in status, stable for surgery.  I have reviewed the patient's chart and labs.  Questions were answered to the patient's satisfaction.     Loanne DrillingALUISIO,Churchill Grimsley V

## 2015-04-09 NOTE — Anesthesia Preprocedure Evaluation (Addendum)
Anesthesia Evaluation  Patient identified by MRN, date of birth, ID band Patient awake    Reviewed: Allergy & Precautions, NPO status , Patient's Chart, lab work & pertinent test results  Airway Mallampati: II  TM Distance: >3 FB Neck ROM: Full    Dental no notable dental hx.    Pulmonary neg pulmonary ROS,    Pulmonary exam normal breath sounds clear to auscultation       Cardiovascular negative cardio ROS Normal cardiovascular exam Rhythm:Regular Rate:Normal     Neuro/Psych negative neurological ROS  negative psych ROS   GI/Hepatic negative GI ROS, Neg liver ROS,   Endo/Other  negative endocrine ROS  Renal/GU negative Renal ROS     Musculoskeletal negative musculoskeletal ROS (+)   Abdominal   Peds  Hematology negative hematology ROS (+)   Anesthesia Other Findings   Reproductive/Obstetrics                             Anesthesia Physical Anesthesia Plan  ASA: II  Anesthesia Plan: Spinal   Post-op Pain Management:    Induction: Intravenous  Airway Management Planned: Nasal Cannula, Natural Airway and Simple Face Mask  Additional Equipment: None  Intra-op Plan:   Post-operative Plan: Extubation in OR  Informed Consent: I have reviewed the patients History and Physical, chart, labs and discussed the procedure including the risks, benefits and alternatives for the proposed anesthesia with the patient or authorized representative who has indicated his/her understanding and acceptance.   Dental advisory given  Plan Discussed with: CRNA and Surgeon  Anesthesia Plan Comments:        Anesthesia Quick Evaluation

## 2015-04-09 NOTE — Op Note (Signed)
OPERATIVE REPORT  PREOPERATIVE DIAGNOSIS: Osteonecrosis of the Left hip.   POSTOPERATIVE DIAGNOSIS: Osteonecrosis of the Left  hip.   PROCEDURE: Left total hip arthroplasty, anterior approach.   SURGEON: Ollen GrossFrank Malini Flemings, MD   ASSISTANT: Avel Peacerew Perkins, PA-C  ANESTHESIA:  Spinal  ESTIMATED BLOOD LOSS:-125 ml  DRAINS: Hemovac x1.   COMPLICATIONS: None   CONDITION: PACU - hemodynamically stable.   BRIEF CLINICAL NOTE: Luke Davila is a 50 y.o. male who has advanced osteonecrosis of his Left  hip with progressively worsening pain and  dysfunction.The patient has failed nonoperative management and presents for  total hip arthroplasty.   PROCEDURE IN DETAIL: After successful administration of spinal  anesthetic, the traction boots [facility] were placed on both  feet and the patient was [facility] the [facility] bed, boots placed into the leg  holders. The Left hip was then isolated from the perineum with plastic  drapes and prepped and draped in the usual sterile fashion. ASIS and  greater trochanter were marked and a oblique incision was made, starting  at about 1 cm lateral and 2 cm distal to the ASIS and coursing towards  the anterior cortex of the femur. The skin was cut with a 10 blade  through subcutaneous tissue to the level of the fascia overlying the  tensor fascia lata muscle. The fascia was then incised in line with the  incision at the junction of the anterior third and posterior 2/3rd. The  muscle was teased off the fascia and then the interval between the TFL  and the rectus was developed. The Hohmann retractor was then placed at  the top of the femoral neck over the capsule. The vessels overlying the  capsule were cauterized and the fat on top of the capsule was removed.  A Hohmann retractor was then placed anterior underneath the rectus  femoris to give exposure to the entire anterior capsule. A T-shaped  capsulotomy was performed. The edges were tagged  and the femoral head  was identified.       Osteophytes are removed off the superior acetabulum.  The femoral neck was then cut in situ with an oscillating saw. Traction  was then applied to the left lower extremity utilizing the [facility]  traction. The femoral head was then removed. Retractors were placed  around the acetabulum and then circumferential removal of the labrum was  performed. Osteophytes were also removed. Reaming starts at 47 mm to  medialize and  Increased in 2 mm increments to 53 mm. We reamed in  approximately 40 degrees of abduction, 20 degrees anteversion. A 54 mm  pinnacle acetabular shell was then impacted in anatomic position under  fluoroscopic guidance with excellent purchase. We did not need to place  any additional dome screws. A 36 mm neutral + 4 marathon liner was then  placed into the acetabular shell.       The femoral lift was then placed along the lateral aspect of the femur  just distal to the vastus ridge. The leg was  externally rotated and capsule  was stripped off the inferior aspect of the femoral neck down to the  level of the lesser trochanter, this was done with electrocautery. The femur was lifted after this was performed. The  leg was then placed and extended in adducted position to essentially delivering the femur. We also removed the capsule superiorly and the  piriformis from the piriformis fossa to gain excellent exposure  of the  proximal femur. Rongeur was used to remove some cancellous bone to get  into the lateral portion of the proximal femur for placement of the  initial starter reamer. The starter broaches was placed  the starter broach  and was shown to go down the center of the canal. Broaching  with the  Corail system was then performed starting at size 8, coursing  Up to size 15. A size 15 had excellent torsional and rotational  and axial stability. The trial standard offset neck was then placed  with a 36 + 5 trial head. The hip was  then reduced. We confirmed that  the stem was in the canal both on AP and lateral x-rays. It also has excellent sizing. The hip was reduced with outstanding stability through full extension, full external rotation,  and then flexion in adduction internal rotation. AP pelvis was taken  and the leg lengths were measured and found to be exactly equal. Hip  was then dislocated again and the femoral head and neck removed. The  femoral broach was removed. Size 15 Corail stem with a standard offset  neck was then impacted into the femur following native anteversion. Has  excellent purchase in the canal. Excellent torsional and rotational and  axial stability. It is confirmed to be in the canal on AP and lateral  fluoroscopic views. The 36 + 5 ceramic head was placed and the hip  reduced with outstanding stability. Again AP pelvis was taken and it  confirmed that the leg lengths were equal. The wound was then copiously  irrigated with saline solution and the capsule reattached and repaired  with Ethibond suture. 30 ml of .25% Bupivicaine injected into the capsule and into the edge of the tensor fascia lata as well as subcutaneous tissue. The fascia overlying the tensor fascia lata was  then closed with a running #1 V-Loc. Subcu was closed with interrupted  2-0 Vicryl and subcuticular running 4-0 Monocryl. Incision was cleaned  and dried. Steri-Strips and a bulky sterile dressing applied. Hemovac  drain was hooked to suction and then he was awakened and transported to  recovery in stable condition.        Please note that a surgical assistant was a medical necessity for this procedure to perform it in a safe and expeditious manner. Assistant was necessary to provide appropriate retraction of vital neurovascular structures and to prevent femoral fracture and allow for anatomic placement of the prosthesis.  Ollen Gross, M.D.

## 2015-04-09 NOTE — Anesthesia Procedure Notes (Addendum)
Spinal Patient location during procedure: OR Start time: 04/09/2015 3:20 PM End time: 04/09/2015 3:28 PM Reason for block: at surgeon's request Staffing Resident/CRNA: Christell Faith L Performed by: resident/CRNA  Preanesthetic Checklist Completed: patient identified, site marked, surgical consent, pre-op evaluation, timeout performed, IV checked, risks and benefits discussed, monitors and equipment checked and at surgeon's request Spinal Block Patient position: sitting Prep: ChloraPrep Patient monitoring: heart rate, continuous pulse ox and blood pressure Approach: midline Location: L3-4 Injection technique: single-shot Needle Needle type: Sprotte  Needle gauge: 24 G Needle length: 10 cm Assessment Sensory level: T4 Additional Notes Expiration of kit checked and confirmed. Patient tolerated procedure well,without complications x 1 attempt with noted clear CSF. Loss of motor and sensory on exam post injection.  Procedure Name: MAC Date/Time: 04/09/2015 1:25 PM Performed by: West Pugh Pre-anesthesia Checklist: Patient identified, Timeout performed, Emergency Drugs available, Suction available and Patient being monitored Patient Re-evaluated:Patient Re-evaluated prior to inductionOxygen Delivery Method: Simple face mask

## 2015-04-09 NOTE — Transfer of Care (Signed)
Immediate Anesthesia Transfer of Care Note  Patient: Luke Davila  Procedure(s) Performed: Procedure(s): LEFT TOTAL HIP ARTHROPLASTY ANTERIOR APPROACH (Left)  Patient Location: PACU  Anesthesia Type:Spinal  Level of Consciousness: awake, alert  and oriented  Airway & Oxygen Therapy: Patient Spontanous Breathing and Patient connected to face mask oxygen  Post-op Assessment: Report given to RN and Post -op Vital signs reviewed and stable  Post vital signs: Reviewed and stable  Last Vitals: There were no vitals filed for this visit.  Complications: No apparent anesthesia complications

## 2015-04-09 NOTE — H&P (View-Only) (Signed)
Luke Davila DOB: 02/22/1965 Married / Language: English / Race: Black or African American Male Date of Admission:  04/09/2015 CC:  Left Hip pain History of Present Illness  The patient is a 49 year old male who comes in for a preoperative History and Physical. The patient is scheduled for a left total hip arthroplasty (anterior) to be performed by Dr. Frank V. Aluisio, MD at [facility] on 04-09-2015. The patient is a 49 year old male who presents with a hip problem. The patient reports left hip problems including pain symptoms that have been present for 3 month(s). The symptoms began without any known injury. The patient reports symptoms radiating to the: left thigh.The patient feels as if their symptoms are does feel they are worsening. Symptoms are exacerbated by weight bearing, walking and sitting. Current treatment includes oral corticosteroids and opioid analgesics (Hydrocodone). Prior to being seen today the patient was previously evaluated by a primary physician (patient has also been to the emergency room). Previous workup for this problem has included pelvic x-rays, hip x-rays and hip MRI. For the past three or four months, he has had significant pain in his left hip. He does not recall a specific injury leading to this. He recently had x-rays and MRI, was told he has avascular necrosis of the hip. His risk factors do include a long history of drinking approximately six beers per day and also he was on high dose corticosteroids last year due to a visual disturbance. He is currently off the steroids now, but has had a few dose packs here in the last two or three months. Unfortunately, the hip pain is severe. It is in his groin. It radiates to his knee. He is not having any lower extremity weakness or paresthesia with this. He was seen for evaluation as his MRI did show collapse of his femoral head. Due to the nature of the condition of his hip, it is felt that he would benefit form  undergoing total hip replacement. He is ready to proceed with surgery. They have been treated conservatively in the past for the above stated problem and despite conservative measures, they continue to have progressive pain and severe functional limitations and dysfunction. They have failed non-operative management including home exercise, medications, and steroids. It is felt that they would benefit from undergoing total joint replacement. Risks and benefits of the procedure have been discussed with the patient and they elect to proceed with surgery. There are no active contraindications to surgery such as ongoing infection or rapidly progressive neurological disease.  Problem List/Past Medical Leg pain (M79.606)  Low back pain (M54.5)  Poor flexibility of tendon (M67.80)  Avascular necrosis of bone of left hip (M87.052)  Weakness of trunk musculature (M62.81)  Gout  Allergies No Known Drug Allergies   Family History Hypertension  Mother.  Social History Tobacco use  Never smoker. 01/24/2013 Children  2 Current drinker  01/24/2013: Currently drinks beer 8-14 times per week Current work status  working full time Exercise  Exercises rarely; does running / walking Living situation  live with spouse Marital status  married No history of drug/alcohol rehab  Not under pain contract  Number of flights of stairs before winded  greater than 5 Tobacco / smoke exposure  01/24/2013: no  Medication History Tamsulosin HCl (0.4MG Capsule, Oral) Active.  Past Surgical History Foot Surgery  left  Review of Systems General Not Present- Chills, Fatigue, Fever, Memory Loss, Night Sweats, Weight Gain and Weight Loss.   Skin Not Present- Eczema, Hives, Itching, Lesions and Rash. HEENT Present- Blurred Vision. Not Present- Dentures, Double Vision, Headache, Hearing Loss, Tinnitus and Visual Loss. Respiratory Not Present- Allergies, Chronic Cough, Coughing up blood, Shortness of  breath at rest and Shortness of breath with exertion. Cardiovascular Not Present- Chest Pain, Difficulty Breathing Lying Down, Murmur, Palpitations, Racing/skipping heartbeats and Swelling. Gastrointestinal Not Present- Abdominal Pain, Bloody Stool, Constipation, Diarrhea, Difficulty Swallowing, Heartburn, Jaundice, Loss of appetitie, Nausea and Vomiting. Male Genitourinary Not Present- Blood in Urine, Discharge, Flank Pain, Incontinence, Painful Urination, Urgency, Urinary frequency, Urinary Retention, Urinating at Night and Weak urinary stream. Musculoskeletal Present- Joint Pain. Not Present- Back Pain, Joint Swelling, Morning Stiffness, Muscle Pain, Muscle Weakness and Spasms. Neurological Not Present- Blackout spells, Difficulty with balance, Dizziness, Paralysis, Tremor and Weakness. Psychiatric Not Present- Insomnia.  Vitals Weight: 260 lb Height: 75in Body Surface Area: 2.45 m Body Mass Index: 32.5 kg/m  Pulse: 88 (Regular)  BP: 118/70 (Sitting, Right Arm, Standard  Physical Exam General Mental Status -Alert, cooperative and good historian. General Appearance-pleasant, Not in acute distress. Orientation-Oriented X3. Build & Nutrition-Well nourished and Well developed.  Head and Neck Head-normocephalic, atraumatic . Neck Global Assessment - supple, no bruit auscultated on the right, no bruit auscultated on the left.  Eye Pupil - Bilateral-Regular and Round. Motion - Bilateral-EOMI.  Chest and Lung Exam Auscultation Breath sounds - clear at anterior chest wall and clear at posterior chest wall. Adventitious sounds - No Adventitious sounds.  Cardiovascular Auscultation Rhythm - Regular rate and rhythm. Heart Sounds - S1 WNL and S2 WNL. Murmurs & Other Heart Sounds - Auscultation of the heart reveals - No Murmurs.  Abdomen Palpation/Percussion Tenderness - Abdomen is non-tender to palpation. Rigidity (guarding) - Abdomen is  soft. Auscultation Auscultation of the abdomen reveals - Bowel sounds normal.  Male Genitourinary Note: Not done, not pertinent to present illness   Musculoskeletal Note: A well developed male, alert and oriented, in no apparent distress. Evaluation of his right hip, normal range of motion, no discomfort. Left hip flexion to about 110, rotation in 20, out 30, abduction 30 with discomfort on range of motion. He does not have any mechanical pain. Specifically, there is no popping or catching on range of motion.  IMAGING Plain radiographs, AP pelvis and lateral of the left hip demonstrate that he has significant osteonecrosis with involvement of over 50% of femoral head and now with an area of subchondral collapse. His MRI scan shows involvement of about 75% of the left femoral head with an area of collapse. His right hip has osteonecrosis also, but no collapse associated with it.  Assessment & Plan Avascular necrosis of bone of left hip (M87.052)  Note:Surgical Plans: Left Total Hip Replacement - Anterior Approach  Disposition: Home  PCP: Dr. Stacks  IV TXA  Anesthesia Issues: None  Signed electronically by Alezandrew L Perkins, III PA-C 

## 2015-04-10 ENCOUNTER — Encounter (HOSPITAL_COMMUNITY): Payer: Self-pay | Admitting: Orthopedic Surgery

## 2015-04-10 LAB — BASIC METABOLIC PANEL
Anion gap: 8 (ref 5–15)
BUN: 9 mg/dL (ref 6–20)
CHLORIDE: 107 mmol/L (ref 101–111)
CO2: 25 mmol/L (ref 22–32)
CREATININE: 1.04 mg/dL (ref 0.61–1.24)
Calcium: 8.8 mg/dL — ABNORMAL LOW (ref 8.9–10.3)
GFR calc Af Amer: >60 mL/min (ref >60)
GFR calc non Af Amer: >60 mL/min (ref >60)
GLUCOSE: 147 mg/dL — AB (ref 65–99)
POTASSIUM: 4.2 mmol/L (ref 3.5–5.1)
SODIUM: 140 mmol/L (ref 135–145)

## 2015-04-10 LAB — CBC
HCT: 36 % — ABNORMAL LOW (ref 39.0–52.0)
HEMOGLOBIN: 11.7 g/dL — AB (ref 13.0–17.0)
MCH: 26.4 pg (ref 26.0–34.0)
MCHC: 32.5 g/dL (ref 30.0–36.0)
MCV: 81.1 fL (ref 78.0–100.0)
PLATELETS: 224 10*3/uL (ref 150–400)
RBC: 4.44 MIL/uL (ref 4.22–5.81)
RDW: 13.9 % (ref 11.5–15.5)
WBC: 8.7 10*3/uL (ref 4.0–10.5)

## 2015-04-10 MED ORDER — NAPHAZOLINE-GLYCERIN 0.012-0.2 % OP SOLN
1.0000 [drp] | Freq: Four times a day (QID) | OPHTHALMIC | Status: DC | PRN
  Administered 2015-04-10: 2 [drp] via OPHTHALMIC
  Filled 2015-04-10: qty 15

## 2015-04-10 MED ORDER — TRAMADOL HCL 50 MG PO TABS: 50.0000 mg | ORAL_TABLET | Freq: Four times a day (QID) | ORAL | Status: DC | PRN

## 2015-04-10 MED ORDER — METHOCARBAMOL 500 MG PO TABS: 500.0000 mg | ORAL_TABLET | Freq: Four times a day (QID) | ORAL | Status: DC | PRN

## 2015-04-10 MED ORDER — RIVAROXABAN 10 MG PO TABS: 10.0000 mg | ORAL_TABLET | Freq: Every day | ORAL | Status: DC

## 2015-04-10 MED ORDER — OXYCODONE HCL 5 MG PO TABS: 5.0000 mg | ORAL_TABLET | ORAL | Status: DC | PRN

## 2015-04-10 NOTE — Discharge Instructions (Signed)
° °Dr. Frank Aluisio °Total Joint Specialist °[facility] °[address] °[place], [place] [address] °[phone] ° °ANTERIOR APPROACH TOTAL HIP REPLACEMENT POSTOPERATIVE DIRECTIONS ° ° °Hip Rehabilitation, Guidelines Following Surgery  °The results of a hip operation are greatly improved after range of motion and muscle strengthening exercises. Follow all safety measures which are given to protect your hip. If any of these exercises cause increased pain or swelling in your joint, decrease the amount until you are comfortable again. Then slowly increase the exercises. Call your caregiver if you have problems or questions.  ° °HOME CARE INSTRUCTIONS  °Remove items at home which could result in a fall. This includes throw rugs or furniture in walking pathways.  °· ICE to the affected hip every three hours for 30 minutes at a time and then as needed for pain and swelling.  Continue to use ice on the hip for pain and swelling from surgery. You may notice swelling that will progress down to the foot and ankle.  This is normal after surgery.  Elevate the leg when you are not up walking on it.   °· Continue to use the breathing machine which will help keep your temperature down.  It is common for your temperature to cycle up and down following surgery, especially at night when you are not up moving around and exerting yourself.  The breathing machine keeps your lungs expanded and your temperature down. ° ° °DIET °You may resume your previous home diet once your are discharged from the hospital. ° °DRESSING / WOUND CARE / SHOWERING °You may shower 3 days after surgery, but keep the wounds dry during showering.  You may use an occlusive plastic wrap (Press'n Seal for example), NO SOAKING/SUBMERGING IN THE BATHTUB.  If the bandage gets wet, change with a clean dry gauze.  If the incision gets wet, pat the wound dry with a clean towel. °You may start showering once you are discharged home but do not  submerge the incision under water. Just pat the incision dry and apply a dry gauze dressing on daily. °Change the surgical dressing daily and reapply a dry dressing each time. ° °ACTIVITY °Walk with your walker as instructed. °Use walker as long as suggested by your caregivers. °Avoid periods of inactivity such as sitting longer than an hour when not asleep. This helps prevent blood clots.  °You may resume a sexual relationship in one month or when given the OK by your doctor.  °You may return to work once you are cleared by your doctor.  °Do not drive a car for 6 weeks or until released by you surgeon.  °Do not drive while taking narcotics. ° °WEIGHT BEARING °Weight bearing as tolerated with assist device (walker, cane, etc) as directed, use it as long as suggested by your surgeon or therapist, typically at least 4-6 weeks. ° °POSTOPERATIVE CONSTIPATION PROTOCOL °Constipation - defined medically as fewer than three stools per week and severe constipation as less than one stool per week. ° °One of the most common issues patients have following surgery is constipation.  Even if you have a regular bowel pattern at home, your normal regimen is likely to be disrupted due to multiple reasons following surgery.  Combination of anesthesia, postoperative narcotics, change in appetite and fluid intake all can affect your bowels.  In order to avoid complications following surgery, here are some recommendations in order to help you during your recovery period. ° °Colace (docusate) - Pick up an over-the-counter   form of Colace or another stool softener and take twice a day as long as you are requiring postoperative pain medications.  Take with a full glass of water daily.  If you experience loose stools or diarrhea, hold the colace until you stool forms back up.  If your symptoms do not get better within 1 week or if they get worse, check with your doctor. ° °Dulcolax (bisacodyl) - Pick up over-the-counter and take as directed  by the product packaging as needed to assist with the movement of your bowels.  Take with a full glass of water.  Use this product as needed if not relieved by Colace only.  ° °MiraLax (polyethylene glycol) - Pick up over-the-counter to have on hand.  MiraLax is a solution that will increase the amount of water in your bowels to assist with bowel movements.  Take as directed and can mix with a glass of water, juice, soda, coffee, or tea.  Take if you go more than two days without a movement. °Do not use MiraLax more than once per day. Call your doctor if you are still constipated or irregular after using this medication for 7 days in a row. ° °If you continue to have problems with postoperative constipation, please contact the office for further assistance and recommendations.  If you experience "the worst abdominal pain ever" or develop nausea or vomiting, please contact the office immediatly for further recommendations for treatment. ° °ITCHING ° If you experience itching with your medications, try taking only a single pain pill, or even half a pain pill at a time.  You can also use Benadryl over the counter for itching or also to help with sleep.  ° °TED HOSE STOCKINGS °Wear the elastic stockings on both legs for three weeks following surgery during the day but you may remove then at night for sleeping. ° °MEDICATIONS °See your medication summary on the “After Visit Summary” that the nursing staff will review with you prior to discharge.  You may have some home medications which will be placed on hold until you complete the course of blood thinner medication.  It is important for you to complete the blood thinner medication as prescribed by your surgeon.  Continue your approved medications as instructed at time of discharge. ° °PRECAUTIONS °If you experience chest pain or shortness of breath - call 911 immediately for transfer to the hospital emergency department.  °If you develop a fever greater that 101 F,  purulent drainage from wound, increased redness or drainage from wound, foul odor from the wound/dressing, or calf pain - CONTACT YOUR SURGEON.   °                                                °FOLLOW-UP APPOINTMENTS °Make sure you keep all of your appointments after your operation with your surgeon and caregivers. You should call the office at the above phone number and make an appointment for approximately two weeks after the date of your surgery or on the date instructed by your surgeon outlined in the "After Visit Summary". ° °RANGE OF MOTION AND STRENGTHENING EXERCISES  °These exercises are designed to help you keep full movement of your hip joint. Follow your caregiver's or physical therapist's instructions. Perform all exercises about fifteen times, three times per day or as directed. Exercise both hips, even if you   have had only one joint replacement. These exercises can be done on a training (exercise) mat, on the floor, on a table or on a bed. Use whatever works the best and is most comfortable for you. Use music or television while you are exercising so that the exercises are a pleasant break in your day. This will make your life better with the exercises acting as a break in routine you can look forward to.  °Lying on your back, slowly slide your foot toward your buttocks, raising your knee up off the floor. Then slowly slide your foot back down until your leg is straight again.  °Lying on your back spread your legs as far apart as you can without causing discomfort.  °Lying on your side, raise your upper leg and foot straight up from the floor as far as is comfortable. Slowly lower the leg and repeat.  °Lying on your back, tighten up the muscle in the front of your thigh (quadriceps muscles). You can do this by keeping your leg straight and trying to raise your heel off the floor. This helps strengthen the largest muscle supporting your knee.  °Lying on your back, tighten up the muscles of your  buttocks both with the legs straight and with the knee bent at a comfortable angle while keeping your heel on the floor.  ° °IF YOU ARE TRANSFERRED TO A SKILLED REHAB FACILITY °If the patient is transferred to a skilled rehab facility following release from the hospital, a list of the current medications will be sent to the facility for the patient to continue.  When discharged from the skilled rehab facility, please have the facility set up the patient's Home Health Physical Therapy prior to being released. Also, the skilled facility will be responsible for providing the patient with their medications at time of release from the facility to include their pain medication, the muscle relaxants, and their blood thinner medication. If the patient is still at the rehab facility at time of the two week follow up appointment, the skilled rehab facility will also need to assist the patient in arranging follow up appointment in our office and any transportation needs. ° °MAKE SURE YOU:  °Understand these instructions.  °Get help right away if you are not doing well or get worse.  ° ° °Pick up stool softner and laxative for home use following surgery while on pain medications. °Do not submerge incision under water. °Please use good hand washing techniques while changing dressing each day. °May shower starting three days after surgery. °Please use a clean towel to pat the incision dry following showers. °Continue to use ice for pain and swelling after surgery. °Do not use any lotions or creams on the incision until instructed by your surgeon. ° °Take Xarelto for two and a half more weeks, then discontinue Xarelto. °Once the patient has completed the blood thinner regimen, then take a Baby 81 mg Aspirin daily for three more weeks. ° °

## 2015-04-10 NOTE — Progress Notes (Signed)
Physical Therapy Treatment Patient Details Name: Luke Davila MRN: [id number] DOB: [birth year] Today's Date: 04/10/2015    History of Present Illness L DATHA    PT Comments    Progressing well, c/o anterior thigh burning. Practice steps in AM.  Follow Up Recommendations  Home health PT;Supervision/Assistance - 24 hour     Equipment Recommendations  Rolling walker with 5" wheels    Recommendations for Other Services       Precautions / Restrictions Precautions Precautions: Fall    Mobility  Bed Mobility Overal bed mobility: Needs Assistance Bed Mobility: Supine to Sit     Supine to sit: Supervision        Transfers Overall transfer level: Needs assistance Equipment used: Rolling walker (2 wheeled) Transfers: Sit to/from Stand Sit to Stand: Supervision         General transfer comment: cues for hand placement and for LLEg position  Ambulation/Gait Ambulation/Gait assistance: Min guard Ambulation Distance (Feet): 262 Feet Assistive device: Rolling walker (2 wheeled) Gait Pattern/deviations: Step-to pattern;Step-through pattern     General Gait Details: cues for sequence   Stairs            Wheelchair Mobility    Modified Rankin (Stroke Patients Only)       Balance                                    Cognition Arousal/Alertness: Awake/alert Behavior During Therapy: Impulsive                        Exercises Total Joint Exercises Long Arc Quad: AROM;Left;10 reps;Seated Marching in Standing: AAROM;Left;Seated;10 reps    General Comments        Pertinent Vitals/Pain Pain Score: 7  Pain Location: L thigh Pain Descriptors / Indicators: Aching;Burning;Discomfort Pain Intervention(s): Limited activity within patient's tolerance;Monitored during session;Patient requesting pain meds-RN notified    Home Living                      Prior Function            PT Goals (current goals can now be found in  the care plan section) Progress towards PT goals: Progressing toward goals    Frequency  7X/week    PT Plan Current plan remains appropriate    Co-evaluation             End of Session Equipment Utilized During Treatment: Gait belt Activity Tolerance: Patient tolerated treatment well Patient left: with nursing/sitter in room (in bathroom with RN in room)     Time: [id number] PT Time Calculation (min) (ACUTE ONLY): 34 min  Charges:  $Gait Training: 23-37 mins                    G Codes:      Luke Davila, Luke Davila 04/10/2015, 6:16 PM

## 2015-04-10 NOTE — Care Management Note (Addendum)
Case Management Note  Patient Details  Name: Luke Davila MRN: [id number] Date of Birth: 18-Feb-1965  Subjective/Objective:                  LEFT TOTAL HIP ARTHROPLASTY ANTERIOR APPROACH (Left) Action/Plan: Discharge planning Expected Discharge Date:                  Expected Discharge Plan:  [name]  In-House Referral:     Discharge planning Services  CM Consult  Post Acute Care Choice:  Home Health Choice offered to:  Patient  DME Arranged:  Walker rolling DME Agency:  [place]:  PT [facility] Agency:  [place]  Status of Service:  Completed, signed off  Medicare Important Message Given:    Date Medicare IM Given:    Medicare IM give by:    Date Additional Medicare IM Given:    Additional Medicare Important Message give by:     If discussed at [place] of Stay Meetings, dates discussed:    Additional Comments: CM met with pt who states he is going to recuperate at his mother's address: [name] and contact is [phone].  Pt chooses [facility] to render HHPT.  Referral given to [facility] rep, Tim (on unit) with appropriate address.  CM called [facility] DME rep, Lecretia to please deliver the Rolling walker to room prior to discharge.  No other CM needs were communicated. Dellie Catholic, RN 04/10/2015, 1:16 PM

## 2015-04-10 NOTE — Evaluation (Signed)
Physical Therapy Evaluation Patient Details Name: Luke Davila MRN: [id number] DOB: 1965-02-08 Today's Date: 04/10/2015   History of Present Illness  Luke Davila  Clinical Impression  The patient is mobilizing well today POD 1. Plans to stay with mother initially. The patient will benefit from PT to address problems listed in the note below.    Follow Up Recommendations Home health PT;Supervision/Assistance - 24 hour    Equipment Recommendations  Rolling walker with 5" wheels    Recommendations for Other Services       Precautions / Restrictions Precautions Precautions: Fall Precaution Comments: Direct anterior approach - no hip precautions Restrictions Weight Bearing Restrictions: Yes LLE Weight Bearing: Weight bearing as tolerated      Mobility  Bed Mobility               General bed mobility comments: sitting on the bed edge  Transfers Overall transfer level: Needs assistance Equipment used: Rolling walker (2 wheeled) Transfers: Sit to/from Stand Sit to Stand: Supervision         General transfer comment: cues for hand placement and for LLEg position  Ambulation/Gait Ambulation/Gait assistance: Min guard Ambulation Distance (Feet): 200 Feet Assistive device: Rolling walker (2 wheeled) Gait Pattern/deviations: Step-to pattern;Step-through pattern     General Gait Details: cues for sequence  Stairs            Wheelchair Mobility    Modified Rankin (Stroke Patients Only)       Balance Overall balance assessment: Needs assistance Sitting-balance support: No upper extremity supported;Feet supported Sitting balance-Leahy Scale: Normal     Standing balance support: Bilateral upper extremity supported;During functional activity Standing balance-Leahy Scale: Good Standing balance comment: reliance on RW for safety and balance                             Pertinent Vitals/Pain Pain Assessment: Faces Pain Score: 3  Faces Pain  Scale: Hurts even more Pain Location: Luke thigh Pain Descriptors / Indicators: Aching;Tightness;Sore Pain Intervention(s): Monitored during session;Premedicated before session;Ice applied    Home Living Family/patient expects to be discharged to:: Private residence Living Arrangements: Alone     Home Access: Stairs to enter   [employer] of Steps: mother  has 1, his house has 6 with a rail Home Layout: One level Home Equipment: None Additional Comments: Pt reports he plans to stay with his mom initially. Pt reports that his mom has a tub/shower, but his house has a walk-in shower. Therefore, he plans to go to his house to shower.     Prior Function Level of Independence: Independent               Hand Dominance   Dominant Hand: Right    Extremity/Trunk Assessment   Upper Extremity Assessment: Defer to OT evaluation           Lower Extremity Assessment: LLE deficits/detail   LLE Deficits / Details: advances the Luke leg after first few steps  Cervical / Trunk Assessment: Normal  Communication   Communication: No difficulties  Cognition Arousal/Alertness: Awake/alert Behavior During Therapy: WFL for tasks assessed/performed Overall Cognitive Status: Within Functional Limits for tasks assessed                      General Comments      Exercises Total Joint Exercises Long Arc Quad: AROM;Left;10 reps;Seated Marching in Standing: AAROM;Left;Seated;10 reps      Assessment/Plan  PT Assessment Patient needs continued PT services  PT Diagnosis Difficulty walking   PT Problem List Decreased strength;Decreased range of motion;Decreased activity tolerance;Decreased mobility;Decreased knowledge of precautions;Pain  PT Treatment Interventions DME instruction;Gait training;Stair training;Functional mobility training;Therapeutic exercise;Patient/family education   PT Goals (Current goals can be found in the Care Plan section) Acute Rehab PT  Goals Patient Stated Goal: discharge to mom's place tomorrow  PT Goal Formulation: With patient Time For Goal Achievement: 04/13/15 Potential to Achieve Goals: Good    Frequency 7X/week   Barriers to discharge        Co-evaluation               End of Session Equipment Utilized During Treatment: Gait belt Activity Tolerance: Patient tolerated treatment well Patient left: in chair;with call bell/phone within reach;with chair alarm set Nurse Communication: Mobility status         Time: [id number] PT Time Calculation (min) (ACUTE ONLY): 23 min   Charges:   PT Evaluation $PT Eval Low Complexity: 1 Procedure PT Treatments $Gait Training: 8-22 mins   PT G Codes:        Rada HayHill, Keimora Swartout Elizabeth 04/10/2015, 1:05 PM Blanchard KelchKaren Ameir Faria PT [phone]

## 2015-04-10 NOTE — Discharge Summary (Signed)
Physician Discharge Summary   Patient ID: Luke Davila MRN: [id number] DOB/AGE: 1965-01-20 50 y.o.  Admit date: 04/09/2015 Discharge date: 04/11/2015  Primary Diagnosis:  Osteonecrosis of the Left hip.  Admission Diagnoses:  Past Medical History  Diagnosis Date  . Gout   . Avascular necrosis of hip (HCC)     LEFT  . Enlarged prostate    Discharge Diagnoses:   Principal Problem:   Avascular necrosis of bone of left hip (HCC) Active Problems:   OA (osteoarthritis) of hip  Estimated body mass index is 32.87 kg/(m^2) as calculated from the following:   Height as of this encounter: '6\' 3"'  (1.905 m).   Weight as of this encounter: 119.296 kg (263 lb).  Procedure(s) (LRB): LEFT TOTAL HIP ARTHROPLASTY ANTERIOR APPROACH (Left)   Consults: None  HPI: Luke Davila is a 50 y.o. male who has advanced osteonecrosis of his Left hip with progressively worsening pain and  dysfunction.The patient has failed nonoperative management and presents for  total hip arthroplasty.  Laboratory Data: Admission on 04/09/2015  Component Date Value Ref Range Status  . WBC 04/10/2015 8.7  4.0 - 10.5 K/uL Final  . RBC 04/10/2015 4.44  4.22 - 5.81 MIL/uL Final  . Hemoglobin 04/10/2015 11.7* 13.0 - 17.0 g/dL Final  . HCT 04/10/2015 36.0* 39.0 - 52.0 % Final  . MCV 04/10/2015 81.1  78.0 - 100.0 fL Final  . MCH 04/10/2015 26.4  26.0 - 34.0 pg Final  . MCHC 04/10/2015 32.5  30.0 - 36.0 g/dL Final  . RDW 04/10/2015 13.9  11.5 - 15.5 % Final  . Platelets 04/10/2015 224  150 - 400 K/uL Final  . Sodium 04/10/2015 140  135 - 145 mmol/L Final  . Potassium 04/10/2015 4.2  3.5 - 5.1 mmol/L Final  . Chloride 04/10/2015 107  101 - 111 mmol/L Final  . CO2 04/10/2015 25  22 - 32 mmol/L Final  . Glucose, Bld 04/10/2015 147* 65 - 99 mg/dL Final  . BUN 04/10/2015 9  6 - 20 mg/dL Final  . Creatinine, Ser 04/10/2015 1.04  0.61 - 1.24 mg/dL Final  . Calcium 04/10/2015 8.8* 8.9 - 10.3 mg/dL Final  . GFR calc non Af  Amer 04/10/2015 >60  >60 mL/min Final  . GFR calc Af Amer 04/10/2015 >60  >60 mL/min Final   Comment: (NOTE) The eGFR has been calculated using the CKD EPI equation. This calculation has not been validated in all clinical situations. eGFR's persistently <60 mL/min signify possible Chronic Kidney Disease.   Georgiann Hahn gap 04/10/2015 8  5 - 15 Final  Hospital Outpatient Visit on 04/03/2015  Component Date Value Ref Range Status  . aPTT 04/03/2015 29  24 - 37 seconds Final  . Prothrombin Time 04/03/2015 14.4  11.6 - 15.2 seconds Final  . INR 04/03/2015 1.10  0.00 - 1.49 Final  . ABO/RH(D) 04/03/2015 B POS   Final  . Antibody Screen 04/03/2015 NEG   Final  . Sample Expiration 04/03/2015 04/12/2015   Final  . Extend sample reason 04/03/2015 NO TRANSFUSIONS OR PREGNANCY IN THE PAST 3 MONTHS   Final  . Color, Urine 04/03/2015 YELLOW  YELLOW Final  . APPearance 04/03/2015 CLEAR  CLEAR Final  . Specific Gravity, Urine 04/03/2015 1.010  1.005 - 1.030 Final  . pH 04/03/2015 6.0  5.0 - 8.0 Final  . Glucose, UA 04/03/2015 NEGATIVE  NEGATIVE mg/dL Final  . Hgb urine dipstick 04/03/2015 NEGATIVE  NEGATIVE Final  . Bilirubin Urine 04/03/2015 NEGATIVE  NEGATIVE  Final  . Ketones, ur 04/03/2015 NEGATIVE  NEGATIVE mg/dL Final  . Protein, ur 04/03/2015 NEGATIVE  NEGATIVE mg/dL Final  . Nitrite 04/03/2015 NEGATIVE  NEGATIVE Final  . Leukocytes, UA 04/03/2015 NEGATIVE  NEGATIVE Final   MICROSCOPIC NOT DONE ON URINES WITH NEGATIVE PROTEIN, BLOOD, LEUKOCYTES, NITRITE, OR GLUCOSE <1000 mg/dL.  Marland Kitchen MRSA, PCR 04/03/2015 NEGATIVE  NEGATIVE Final  . Staphylococcus aureus 04/03/2015 POSITIVE* NEGATIVE Final   Comment:        The Xpert SA Assay (FDA approved for NASAL specimens in patients over 108 years of age), is one component of a comprehensive surveillance program.  Test performance has been validated by [facility] for patients greater than or equal to 55 year old. It is not intended to diagnose infection  nor to guide or monitor treatment.   . ABO/RH(D) 04/03/2015 B POS   Final  Office Visit on 03/19/2015  Component Date Value Ref Range Status  . Testosterone 03/19/2015 366  348 - 1197 ng/dL Final  . Comment, Testosterone 03/19/2015 Comment   Final   Comment: Adult male reference interval is based on a population of lean males up to 50 years old.   . Testosterone, Free 03/19/2015 5.1* 6.8 - 21.5 pg/mL Final  . WBC 03/19/2015 3.8  3.4 - 10.8 x10E3/uL Final  . RBC 03/19/2015 4.63  4.14 - 5.80 x10E6/uL Final  . Hemoglobin 03/19/2015 12.3* 12.6 - 17.7 g/dL Final  . Hematocrit 03/19/2015 36.2* 37.5 - 51.0 % Final  . MCV 03/19/2015 78* 79 - 97 fL Final  . MCH 03/19/2015 26.6  26.6 - 33.0 pg Final  . MCHC 03/19/2015 34.0  31.5 - 35.7 g/dL Final  . RDW 03/19/2015 15.5* 12.3 - 15.4 % Final  . Platelets 03/19/2015 293  150 - 379 x10E3/uL Final  . Neutrophils 03/19/2015 54   Final  . Lymphs 03/19/2015 21   Final  . Monocytes 03/19/2015 19   Final  . Eos 03/19/2015 3   Final  . Basos 03/19/2015 1   Final  . Neutrophils Absolute 03/19/2015 2.1  1.4 - 7.0 x10E3/uL Final  . Lymphocytes Absolute 03/19/2015 0.8  0.7 - 3.1 x10E3/uL Final  . Monocytes Absolute 03/19/2015 0.7  0.1 - 0.9 x10E3/uL Final  . EOS (ABSOLUTE) 03/19/2015 0.1  0.0 - 0.4 x10E3/uL Final  . Basophils Absolute 03/19/2015 0.0  0.0 - 0.2 x10E3/uL Final  . Immature Granulocytes 03/19/2015 2   Final  . Immature Grans (Abs) 03/19/2015 0.1  0.0 - 0.1 x10E3/uL Final  . Glucose 03/19/2015 79  65 - 99 mg/dL Final  . BUN 03/19/2015 6  6 - 24 mg/dL Final  . Creatinine, Ser 03/19/2015 1.29* 0.76 - 1.27 mg/dL Final  . GFR calc non Af Amer 03/19/2015 65  >59 mL/min/1.73 Final  . GFR calc Af Amer 03/19/2015 75  >59 mL/min/1.73 Final  . BUN/Creatinine Ratio 03/19/2015 5* 9 - 20 Final  . Sodium 03/19/2015 138  134 - 144 mmol/L Final  . Potassium 03/19/2015 4.3  3.5 - 5.2 mmol/L Final  . Chloride 03/19/2015 98  96 - 106 mmol/L Final  . CO2  03/19/2015 23  18 - 29 mmol/L Final  . Calcium 03/19/2015 9.1  8.7 - 10.2 mg/dL Final  . Total Protein 03/19/2015 6.8  6.0 - 8.5 g/dL Final  . Albumin 03/19/2015 4.3  3.5 - 5.5 g/dL Final  . Globulin, Total 03/19/2015 2.5  1.5 - 4.5 g/dL Final  . Albumin/Globulin Ratio 03/19/2015 1.7  1.2 - 2.2 Final                 **  Please note reference interval change**  . Bilirubin Total 03/19/2015 0.9  0.0 - 1.2 mg/dL Final  . Alkaline Phosphatase 03/19/2015 123* 39 - 117 IU/L Final  . AST 03/19/2015 39  0 - 40 IU/L Final  . ALT 03/19/2015 30  0 - 44 IU/L Final  Office Visit on 02/20/2015  Component Date Value Ref Range Status  . Testosterone 02/20/2015 423  348 - 1197 ng/dL Final  . Comment, Testosterone 02/20/2015 Comment   Final   Comment: Adult male reference interval is based on a population of lean males up to 50 years old.   . Testosterone, Free 02/20/2015 7.6  6.8 - 21.5 pg/mL Final  . Glucose 02/20/2015 92  65 - 99 mg/dL Final  . BUN 02/20/2015 14  6 - 24 mg/dL Final  . Creatinine, Ser 02/20/2015 1.25  0.76 - 1.27 mg/dL Final  . GFR calc non Af Amer 02/20/2015 67  >59 mL/min/1.73 Final  . GFR calc Af Amer 02/20/2015 78  >59 mL/min/1.73 Final  . BUN/Creatinine Ratio 02/20/2015 11  9 - 20 Final  . Sodium 02/20/2015 141  134 - 144 mmol/L Final  . Potassium 02/20/2015 4.3  3.5 - 5.2 mmol/L Final  . Chloride 02/20/2015 100  96 - 106 mmol/L Final  . CO2 02/20/2015 27  18 - 29 mmol/L Final  . Calcium 02/20/2015 9.4  8.7 - 10.2 mg/dL Final  . Total Protein 02/20/2015 6.6  6.0 - 8.5 g/dL Final  . Albumin 02/20/2015 4.3  3.5 - 5.5 g/dL Final  . Globulin, Total 02/20/2015 2.3  1.5 - 4.5 g/dL Final  . Albumin/Globulin Ratio 02/20/2015 1.9  1.1 - 2.5 Final   Comment: **Effective March 19, 2015 the reference interval**   for A/G Ratio will be changing to:              Age                Male          Male           0 -  7 days       1.1 - 2.3       1.1 - 2.3           8 - 30 days       1.2 -  2.8       1.2 - 2.8           1 -  6 months     1.3 - 3.6       1.3 - 3.6    7 months -  5 years      1.5 - 2.6       1.5 - 2.6              > 5 years      1.2 - 2.2       1.2 - 2.2   . Bilirubin Total 02/20/2015 0.4  0.0 - 1.2 mg/dL Final  . Alkaline Phosphatase 02/20/2015 108  39 - 117 IU/L Final  . AST 02/20/2015 15  0 - 40 IU/L Final  . ALT 02/20/2015 28  0 - 44 IU/L Final     X-Rays:Dg Pelvis Portable  04/09/2015  CLINICAL DATA:  LEFT total hip replacement EXAM: DG C-ARM 1-60 MIN-NO REPORT; PORTABLE PELVIS 1-2 VIEWS COMPARISON:  02/18/2015 hip radiographs, MR pelvis 03/04/2015 FINDINGS: Components of a LEFT hip prosthesis are identified in expected positions. No acute fracture or dislocation.  Surgical drain and postsurgical changes of the LEFT hip regional soft tissues identified. Visualized pelvis intact. RIGHT hip joint space preserved. Minimal sclerosis in the RIGHT femoral head corresponding to avascular necrosis changes on prior MR. IMPRESSION: LEFT hip prosthesis without acute complication. Electronically Signed   By: Lavonia Dana M.D.   On: 04/09/2015 18:13   Dg C-arm 1-60 Min-no Report  04/09/2015  CLINICAL DATA: SURGERY C-ARM 1-60 MINUTES Fluoroscopy was utilized by the requesting physician.  No radiographic interpretation.    EKG:No orders found for this or any previous visit.   Hospital Course: Patient was admitted to [facility] and taken to the OR and underwent the above state procedure without complications.  Patient tolerated the procedure well and was later transferred to the recovery room and then to the orthopaedic floor for postoperative care.  They were given PO and IV analgesics for pain control following their surgery.  They were given 24 hours of postoperative antibiotics of  Anti-infectives    Start     Dose/Rate Route Frequency Ordered Stop   04/09/15 2130  ceFAZolin (ANCEF) IVPB 2g/100 mL premix     2 g 200 mL/hr over 30 Minutes Intravenous Every 6 hours  04/09/15 1848 04/10/15 0520   04/09/15 1215  ceFAZolin (ANCEF) IVPB 2g/100 mL premix     2 g 200 mL/hr over 30 Minutes Intravenous On call to O.R. 04/09/15 1207 04/09/15 1535     and started on DVT prophylaxis in the form of Xarelto.   PT and OT were ordered for total hip protocol.  The patient was allowed to be WBAT with therapy. Discharge planning was consulted to help with postop disposition and equipment needs.  Patient had a tough night on the evening of surgery.  They started to get up OOB with therapy on day one.  Hemovac drain was pulled without difficulty.  Continued to work with therapy into day two.  Dressing was changed on day two and the incision was healing well.  Patient was seen in rounds on POD 2 and was ready to go home.  Discharge home with home health Diet - Regular diet Follow up - in 2 weeks Activity - WBAT Disposition - Home Condition Upon Discharge - Good D/C Meds - See DC Summary DVT Prophylaxis - Xarelto  Discharge Instructions    Call MD / Call 911    Complete by:  As directed   If you experience chest pain or shortness of breath, CALL 911 and be transported to the hospital emergency room.  If you develope a fever above 101 F, pus (white drainage) or increased drainage or redness at the wound, or calf pain, call your surgeon's office.     Change dressing    Complete by:  As directed   You may change your dressing dressing daily with sterile 4 x 4 inch gauze dressing and paper tape.  Do not submerge the incision under water.     Constipation Prevention    Complete by:  As directed   Drink plenty of fluids.  Prune juice may be helpful.  You may use a stool softener, such as Colace (over the counter) 100 mg twice a day.  Use MiraLax (over the counter) for constipation as needed.     Diet general    Complete by:  As directed      Discharge instructions    Complete by:  As directed   Pick up stool softner and laxative for home use following surgery  while on pain  medications. Do not submerge incision under water. Please use good hand washing techniques while changing dressing each day. May shower starting three days after surgery. Please use a clean towel to pat the incision dry following showers. Continue to use ice for pain and swelling after surgery. Do not use any lotions or creams on the incision until instructed by your surgeon.  Total Hip Protocol.  Take Xarelto for two and a half more weeks, then discontinue Xarelto. Once the patient has completed the blood thinner regimen, then take a Baby 81 mg Aspirin daily for three more weeks.  Postoperative Constipation Protocol  Constipation - defined medically as fewer than three stools per week and severe constipation as less than one stool per week.  One of the most common issues patients have following surgery is constipation.  Even if you have a regular bowel pattern at home, your normal regimen is likely to be disrupted due to multiple reasons following surgery.  Combination of anesthesia, postoperative narcotics, change in appetite and fluid intake all can affect your bowels.  In order to avoid complications following surgery, here are some recommendations in order to help you during your recovery period.  Colace (docusate) - Pick up an over-the-counter form of Colace or another stool softener and take twice a day as long as you are requiring postoperative pain medications.  Take with a full glass of water daily.  If you experience loose stools or diarrhea, hold the colace until you stool forms back up.  If your symptoms do not get better within 1 week or if they get worse, check with your doctor.  Dulcolax (bisacodyl) - Pick up over-the-counter and take as directed by the product packaging as needed to assist with the movement of your bowels.  Take with a full glass of water.  Use this product as needed if not relieved by Colace only.   MiraLax (polyethylene glycol) - Pick up over-the-counter to  have on hand.  MiraLax is a solution that will increase the amount of water in your bowels to assist with bowel movements.  Take as directed and can mix with a glass of water, juice, soda, coffee, or tea.  Take if you go more than two days without a movement. Do not use MiraLax more than once per day. Call your doctor if you are still constipated or irregular after using this medication for 7 days in a row.  If you continue to have problems with postoperative constipation, please contact the office for further assistance and recommendations.  If you experience "the worst abdominal pain ever" or develop nausea or vomiting, please contact the office immediatly for further recommendations for treatment.     Do not sit on low chairs, stoools or toilet seats, as it may be difficult to get up from low surfaces    Complete by:  As directed      Driving restrictions    Complete by:  As directed   No driving until released by the physician.     Increase activity slowly as tolerated    Complete by:  As directed      Lifting restrictions    Complete by:  As directed   No lifting until released by the physician.     Patient may shower    Complete by:  As directed   You may shower without a dressing once there is no drainage.  Do not wash over the wound.  If drainage remains, do not  shower until drainage stops.     TED hose    Complete by:  As directed   Use stockings (TED hose) for 3 weeks on both leg(s).  You may remove them at night for sleeping.     Weight bearing as tolerated    Complete by:  As directed   Laterality:  left  Extremity:  Lower            Medication List    STOP taking these medications        HYDROcodone-acetaminophen 10-325 MG tablet  Commonly known as:  NORCO     testosterone cypionate 200 MG/ML injection  Commonly known as:  DEPO-TESTOSTERONE      TAKE these medications        allopurinol 100 MG tablet  Commonly known as:  ZYLOPRIM  TAKE 1 TABLET (100 MG TOTAL)  BY MOUTH DAILY.     methocarbamol 500 MG tablet  Commonly known as:  ROBAXIN  Take 1 tablet (500 mg total) by mouth every 6 (six) hours as needed for muscle spasms.     oxyCODONE 5 MG immediate release tablet  Commonly known as:  Oxy IR/ROXICODONE  Take 1-2 tablets (5-10 mg total) by mouth every 3 (three) hours as needed for moderate pain or severe pain.     rivaroxaban 10 MG Tabs tablet  Commonly known as:  XARELTO  Take 1 tablet (10 mg total) by mouth daily with breakfast. Take Xarelto for two and a half more weeks, then discontinue Xarelto. Once the patient has completed the blood thinner regimen, then take a Baby 81 mg Aspirin daily for three more weeks.     tamsulosin 0.4 MG Caps capsule  Commonly known as:  FLOMAX  TAKE 1 CAPSULE (0.4 MG TOTAL) BY MOUTH DAILY.     traMADol 50 MG tablet  Commonly known as:  ULTRAM  Take 1-2 tablets (50-100 mg total) by mouth every 6 (six) hours as needed (mild pain).     VISINE OP  Apply 1 drop to eye 2 (two) times daily.           Follow-up Information    Follow up with [facility].   Why:  rolling walker   Contact information:   [address] [place] [address] [phone]       Follow up with [facility].   Why:  home health physical therapy   Contact information:   [address] [place] [address] [phone]       Follow up with Gearlean Alf, MD On 04/24/2015.   Specialty:  Orthopedic Surgery   Why:  Call office at [phone] to setup appointment on Tuesday 04/24/2015 with Dr. Wynelle Link.   Contact information:   [address] [place] [address] [phone]       Signed: Arlee Muslim, PA-C Orthopaedic Surgery 04/10/2015, 9:55 PM

## 2015-04-10 NOTE — Anesthesia Postprocedure Evaluation (Signed)
Anesthesia Post Note  Patient: Luke Davila  Procedure(s) Performed: Procedure(s) (LRB): LEFT TOTAL HIP ARTHROPLASTY ANTERIOR APPROACH (Left)  Patient location during evaluation: PACU Anesthesia Type: Spinal Level of consciousness: awake Pain management: pain level controlled Vital Signs Assessment: post-procedure vital signs reviewed and stable Respiratory status: spontaneous breathing Cardiovascular status: stable Postop Assessment: no signs of nausea or vomiting and spinal receding Anesthetic complications: no    Last Vitals:  Filed Vitals:   04/10/15 0126 04/10/15 0447  BP: 145/90 149/88  Pulse: 77 72  Temp: 36.6 C 36.6 C  Resp: 18 18    Last Pain:  Filed Vitals:   04/10/15 0745  PainSc: 6                  Overton Boggus

## 2015-04-10 NOTE — Evaluation (Signed)
Occupational Therapy Evaluation AND Discharge  Patient Details Name: Luke RoseSamuel Davila MRN: [id number] DOB: 01-29-1965 Today's Date: 04/10/2015    History of Present Illness L DATHA   Clinical Impression   Patient admitted with above. Patient independent PTA. Patient currently functioning at an overall mod I level, using AE/DME and taking increased time.  No additional OT needs identified, D/C from acute OT services and no additional follow-up OT needs at this time. All appropriate education provided to patient. Please re-order OT if needed.      Follow Up Recommendations  No OT follow up;Supervision - Intermittent    Equipment Recommendations  3 in 1 bedside comode    Recommendations for Other Services  None at this time  Precautions / Restrictions Precautions Precautions: Fall Precaution Comments: Direct anterior approach - no hip precautions Restrictions Weight Bearing Restrictions: Yes LLE Weight Bearing: Weight bearing as tolerated    Mobility Bed Mobility General bed mobility comments: Pt found seated in recliner upon OT entering/exiting room   Transfers Overall transfer level: Modified independent Equipment used: Rolling walker (2 wheeled) Transfers: Sit to/from Stand General transfer comment: No cueing or physical assistance needed, pt able to talk self through safe and effective transfers from recliner and 3-n-1.     Balance Overall balance assessment: Needs assistance Sitting-balance support: No upper extremity supported;Feet supported Sitting balance-Leahy Scale: Normal     Standing balance support: Bilateral upper extremity supported;During functional activity Standing balance-Leahy Scale: Good Standing balance comment: reliance on RW for safety and balance    ADL Overall ADL's : Modified independent General ADL Comments: Pt overall mod I with use of RW and DME prn. Educated pt on walk-in shower transfer technique(anterior/posterior) using 3-n-1 prn AND toilet  transfer using 3-n-1 prn as well.  Pt able to safely and independently reach BLEs for LB ADLs.     Vision Vision Assessment?: No apparent visual deficits          Pertinent Vitals/Pain Pain Assessment: Faces Faces Pain Scale: Hurts even more Pain Location: Left hip with mobility Pain Descriptors / Indicators: Aching;Sore;Grimacing;Guarding Pain Intervention(s): Limited activity within patient's tolerance;Monitored during session;Repositioned;Ice applied;RN gave pain meds during session     Hand Dominance Right   Extremity/Trunk Assessment Upper Extremity Assessment Upper Extremity Assessment: Overall WFL for tasks assessed   Lower Extremity Assessment Lower Extremity Assessment: Defer to PT evaluation   Cervical / Trunk Assessment Cervical / Trunk Assessment: Normal   Communication Communication Communication: No difficulties   Cognition Arousal/Alertness: Awake/alert Behavior During Therapy: WFL for tasks assessed/performed Overall Cognitive Status: Within Functional Limits for tasks assessed              Home Living Family/patient expects to be discharged to:: Private residence Living Arrangements: Alone  - patient's mother will be available 24/7 prn  Bathroom Shower/Tub: Tub/shower unit;Walk-in shower   Bathroom Toilet: Standard     Home Equipment: None   Additional Comments: Pt reports he plans to stay with his mom initially. Pt reports that his mom has a tub/shower, but his house has a walk-in shower. Therefore, he plans to go to his house to shower.       Prior Functioning/Environment Level of Independence: Independent     OT Diagnosis: Generalized weakness;Acute pain   OT Problem List:   N/a, no acute OT needs identified at this time     OT Treatment/Interventions:   N/a, no acute OT needs identified at this time     OT Goals(Current goals can be found  in the care plan section) Acute Rehab OT Goals Patient Stated Goal: discharge to mom's place  tomorrow  OT Goal Formulation: All assessment and education complete, DC therapy  OT Frequency:   N/a, no acute OT needs identified at this time     Barriers to D/C:  none known at this time   End of Session Equipment Utilized During Treatment: Rolling walker  Activity Tolerance: Patient tolerated treatment well Patient left: in chair;with call bell/phone within reach;with chair alarm set   Time: 8657-8469 OT Time Calculation (min): 14 min Charges:  OT General Charges $OT Visit: 1 Procedure OT Evaluation $OT Eval Low Complexity: 1 Procedure  Edwin Cap , MS, OTR/L, CLT Pager: [phone]  04/10/2015, 11:42 AM

## 2015-04-10 NOTE — Progress Notes (Signed)
   Subjective: 1 Day Post-Op Procedure(s) (LRB): LEFT TOTAL HIP ARTHROPLASTY ANTERIOR APPROACH (Left) Patient reports pain as mild and moderate last night but much better this morning. Patient seen in rounds with Dr. Lequita HaltAluisio. Patient is well, and has had no acute complaints or problems We will start therapy today.  Plan is to go Home after hospital stay.  Objective: Vital signs in last 24 hours: Temp:  [97.3 F (36.3 C)-98 F (36.7 C)] 97.8 F (36.6 C) (04/04 0447) Pulse Rate:  [65-93] 72 (04/04 0447) Resp:  [16-25] 18 (04/04 0447) BP: (128-155)/(71-100) 149/88 mmHg (04/04 0447) SpO2:  [98 %-100 %] 98 % (04/04 0447) Weight:  [119.296 kg (263 lb)] 119.296 kg (263 lb) (04/03 1232)  Intake/Output from previous day:  Intake/Output Summary (Last 24 hours) at 04/10/15 0838 Last data filed at 04/10/15 0521  Gross per 24 hour  Intake 4558.33 ml  Output   5635 ml  Net -1076.67 ml     Labs:  Recent Labs  04/10/15 0411  HGB 11.7*    Recent Labs  04/10/15 0411  WBC 8.7  RBC 4.44  HCT 36.0*  PLT 224    Recent Labs  04/10/15 0411  NA 140  K 4.2  CL 107  CO2 25  BUN 9  CREATININE 1.04  GLUCOSE 147*  CALCIUM 8.8*   No results for input(s): LABPT, INR in the last 72 hours.  EXAM General - Patient is Alert, Appropriate and Oriented Extremity - Neurovascular intact Sensation intact distally Dorsiflexion/Plantar flexion intact Dressing - dressing C/D/I Motor Function - intact, moving foot and toes well on exam.  Hemovac pulled without difficulty.  Past Medical History  Diagnosis Date  . Gout   . Avascular necrosis of hip (HCC)     LEFT  . Enlarged prostate     Assessment/Plan: 1 Day Post-Op Procedure(s) (LRB): LEFT TOTAL HIP ARTHROPLASTY ANTERIOR APPROACH (Left) Principal Problem:   Avascular necrosis of bone of left hip (HCC) Active Problems:   OA (osteoarthritis) of hip  Estimated body mass index is 32.87 kg/(m^2) as calculated from the  following:   Height as of this encounter: 6\' 3"  (1.905 m).   Weight as of this encounter: 119.296 kg (263 lb). Advance diet Up with therapy Plan for discharge tomorrow Discharge home with home health  DVT Prophylaxis - Xarelto Weight Bearing As Tolerated left Leg Hemovac Pulled Begin Therapy  Avel Peacerew Perkins, PA-C Orthopaedic Surgery 04/10/2015, 8:38 AM

## 2015-04-11 LAB — BASIC METABOLIC PANEL
ANION GAP: 7 (ref 5–15)
BUN: 10 mg/dL (ref 6–20)
CALCIUM: 9 mg/dL (ref 8.9–10.3)
CO2: 27 mmol/L (ref 22–32)
Chloride: 109 mmol/L (ref 101–111)
Creatinine, Ser: 1.07 mg/dL (ref 0.61–1.24)
GFR calc non Af Amer: >60 mL/min (ref >60)
Glucose, Bld: 142 mg/dL — ABNORMAL HIGH (ref 65–99)
Potassium: 3.8 mmol/L (ref 3.5–5.1)
SODIUM: 143 mmol/L (ref 135–145)

## 2015-04-11 LAB — CBC
HEMATOCRIT: 32.5 % — AB (ref 39.0–52.0)
HEMOGLOBIN: 10.7 g/dL — AB (ref 13.0–17.0)
MCH: 26.1 pg (ref 26.0–34.0)
MCHC: 32.9 g/dL (ref 30.0–36.0)
MCV: 79.3 fL (ref 78.0–100.0)
Platelets: 191 10*3/uL (ref 150–400)
RBC: 4.1 MIL/uL — ABNORMAL LOW (ref 4.22–5.81)
RDW: 13.9 % (ref 11.5–15.5)
WBC: 11.8 10*3/uL — AB (ref 4.0–10.5)

## 2015-04-11 NOTE — Progress Notes (Signed)
   Subjective: 2 Days Post-Op Procedure(s) (LRB): LEFT TOTAL HIP ARTHROPLASTY ANTERIOR APPROACH (Left) Patient reports pain as mild and moderate pain last night. Patient seen in rounds with Dr. Lequita HaltAluisio. Patient is well, but has had some minor complaints of pain in the hip, requiring pain medications Patient is ready to go home later today.  Objective: Vital signs in last 24 hours: Temp:  [97.5 F (36.4 C)-98.6 F (37 C)] 97.5 F (36.4 C) (04/05 0511) Pulse Rate:  [83-87] 83 (04/05 0511) Resp:  [18] 18 (04/05 0511) BP: (130-140)/(67-89) 139/86 mmHg (04/05 0511) SpO2:  [99 %-100 %] 100 % (04/05 0511)  Intake/Output from previous day:  Intake/Output Summary (Last 24 hours) at 04/11/15 0715 Last data filed at 04/11/15 0451  Gross per 24 hour  Intake   1200 ml  Output   4100 ml  Net  -2900 ml     Labs:  Recent Labs  04/10/15 0411 04/11/15 0402  HGB 11.7* 10.7*    Recent Labs  04/10/15 0411 04/11/15 0402  WBC 8.7 11.8*  RBC 4.44 4.10*  HCT 36.0* 32.5*  PLT 224 191    Recent Labs  04/10/15 0411 04/11/15 0402  NA 140 143  K 4.2 3.8  CL 107 109  CO2 25 27  BUN 9 10  CREATININE 1.04 1.07  GLUCOSE 147* 142*  CALCIUM 8.8* 9.0   No results for input(s): LABPT, INR in the last 72 hours.  EXAM: General - Patient is Alert, Appropriate and Oriented Extremity - Neurovascular intact Sensation intact distally Incision - clean, dry, no drainage Motor Function - intact, moving foot and toes well on exam.   Assessment/Plan: 2 Days Post-Op Procedure(s) (LRB): LEFT TOTAL HIP ARTHROPLASTY ANTERIOR APPROACH (Left) Procedure(s) (LRB): LEFT TOTAL HIP ARTHROPLASTY ANTERIOR APPROACH (Left) Past Medical History  Diagnosis Date  . Gout   . Avascular necrosis of hip (HCC)     LEFT  . Enlarged prostate    Principal Problem:   Avascular necrosis of bone of left hip (HCC) Active Problems:   OA (osteoarthritis) of hip  Estimated body mass index is 32.87 kg/(m^2) as  calculated from the following:   Height as of this encounter: 6\' 3"  (1.905 m).   Weight as of this encounter: 119.296 kg (263 lb). Up with therapy Discharge home with home health Diet - Regular diet Follow up - in 2 weeks Activity - WBAT Disposition - Home Condition Upon Discharge - Good D/C Meds - See DC Summary DVT Prophylaxis - Xarelto  Avel Peacerew Perkins, PA-C Orthopaedic Surgery 04/11/2015, 7:15 AM

## 2015-04-11 NOTE — Progress Notes (Signed)
Physical Therapy Treatment Patient Details Name: Luke Davila MRN: [id number] DOB: 1965-02-04 Today's Date: 04/11/2015    History of Present Illness L DATHA    PT Comments    The patient  Appears somewhat tremulous and shakey when plugging in phone. Patient is c/o increased tightness and  Pain, is due pain meds soon. Plans DC to mother's home then his.  Follow Up Recommendations  Home health PT;Supervision/Assistance - 24 hour     Equipment Recommendations  Rolling walker with 5" wheels    Recommendations for Other Services       Precautions / Restrictions Precautions Precautions: Fall Precaution Comments: Direct anterior approach - no hip precautions Restrictions LLE Weight Bearing: Weight bearing as tolerated    Mobility  Bed Mobility   Bed Mobility: Sit to Supine       Sit to supine: Min assist   General bed mobility comments: assist with L leg  Transfers Overall transfer level: Needs assistance Equipment used: Rolling walker (2 wheeled) Transfers: Sit to/from Stand Sit to Stand: Min guard         General transfer comment: cues for hand placement and for LLEg position, mmuch more difficulty standing from low surface.  Ambulation/Gait Ambulation/Gait assistance: Min guard Ambulation Distance (Feet): 50 Feet (x2) Assistive device: Rolling walker (2 wheeled) Gait Pattern/deviations: Step-to pattern;Antalgic;Decreased stance time - left;Decreased step length - left     General Gait Details: cues for sequence   Stairs Stairs: Yes Stairs assistance: Min assist Stair Management: One rail Right;Step to pattern;Forwards;With cane Number of Stairs: 4 General stair comments: cues for sequence and safety. demonstrated to patient 1 step forward.  Wheelchair Mobility    Modified Rankin (Stroke Patients Only)       Balance           Standing balance support: During functional activity;Bilateral upper extremity supported Standing balance-Leahy  Scale: Fair Standing balance comment: more shakey today                    Cognition   Behavior During Therapy: Impulsive (patient noted to be shakey in his hands today.)                        Exercises Total Joint Exercises Ankle Circles/Pumps: AROM;Both;10 reps;Supine Quad Sets: AROM;Both;10 reps;Supine Heel Slides: AAROM;Left;10 reps;Supine Hip ABduction/ADduction: AAROM;Left;10 reps;Supine Long Arc Quad: AROM;Left;10 reps;Seated Marching in Standing: AAROM;Left;Seated;10 reps    General Comments        Pertinent Vitals/Pain Pain Score: 8  Pain Location: L thigh Pain Descriptors / Indicators: Aching;Tender;Grimacing;Guarding;Moaning;Spasm;Tightness Pain Intervention(s): Limited activity within patient's tolerance;Monitored during session;Patient requesting pain meds-RN notified    Home Living                      Prior Function            PT Goals (current goals can now be found in the care plan section) Progress towards PT goals: Progressing toward goals    Frequency  7X/week    PT Plan Current plan remains appropriate    Co-evaluation             End of Session Equipment Utilized During Treatment: Gait belt Activity Tolerance: Patient limited by pain Patient left: in bed;with call bell/phone within reach;with bed alarm set     Time: 1016-1100 PT Time Calculation (min) (ACUTE ONLY): 44 min  Charges:  $Gait Training: 8-22 mins $Therapeutic Exercise: 8-22 mins $Self Care/Home  Management: 8-22                    G Codes:      Rada Hay 04/11/2015, 12:58 PM Blanchard Kelch PT [phone]

## 2015-04-17 ENCOUNTER — Telehealth: Payer: Self-pay | Admitting: Family Medicine

## 2015-04-18 NOTE — Telephone Encounter (Signed)
[facility] wanting to know when patient was seen in our office for injury and the dates he was out of work.  Dates given

## 2015-04-18 NOTE — Telephone Encounter (Signed)
Left message to call back  

## 2015-04-23 ENCOUNTER — Ambulatory Visit: Payer: 59 | Admitting: Family Medicine

## 2015-04-25 ENCOUNTER — Encounter: Payer: Self-pay | Admitting: Family Medicine

## 2015-04-26 ENCOUNTER — Ambulatory Visit: Payer: 59 | Attending: Orthopedic Surgery | Admitting: Physical Therapy

## 2015-04-26 DIAGNOSIS — M25662 Stiffness of left knee, not elsewhere classified: Secondary | ICD-10-CM | POA: Diagnosis present

## 2015-04-26 DIAGNOSIS — M25552 Pain in left hip: Secondary | ICD-10-CM | POA: Diagnosis not present
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
## 2015-04-26 NOTE — Therapy (Signed)
[facility] [place], [place], [address] Phone: [phone]   Fax:  [phone]  Physical Therapy Treatment  Patient Details  Name: Luke Davila MRN: [id number] Date of Birth: Apr 11, 1965 Referring Provider: Claretta Fraise, MD  Encounter Date: 02/28/2015    Past Medical History  Diagnosis Date  . Gout   . Avascular necrosis of hip (HCC)     LEFT  . Enlarged prostate     Past Surgical History  Procedure Laterality Date  . Foot surgery Left 1985  . Left hand surgery    . Total hip arthroplasty Left 04/09/2015    Procedure: LEFT TOTAL HIP ARTHROPLASTY ANTERIOR APPROACH;  Surgeon: Gaynelle Arabian, MD;  Location: [facility];  Service: Orthopedics;  Laterality: Left;    There were no vitals filed for this visit.                                    PT Long Term Goals - 02/16/15 0912    PT LONG TERM GOAL #1   Title I with HEP   Time 6   Status Achieved   PT LONG TERM GOAL #2   Title Able to ambulate without L hip pain   Time 6   Period Weeks   Status New   PT LONG TERM GOAL #3   Title able to stand without forward trunk lean   Time 6   Period Weeks   Status New   PT LONG TERM GOAL #4   Title able to perform sit to stand without pain   Time 6   Period Weeks   Status New             Patient will benefit from skilled therapeutic intervention in order to improve the following deficits and impairments:  Decreased range of motion, Difficulty walking, Decreased activity tolerance, Pain, Impaired flexibility, Postural dysfunction  Visit Diagnosis: Left hip pain  Stiffness of joints, multiple sites  Activity intolerance     Problem List Patient Active Problem List   Diagnosis Date Noted  . OA (osteoarthritis) of hip 04/09/2015  . Avascular necrosis of bone of left hip ([place]) 03/06/2015  . Hypogonadism in male 09/12/2014  . Erectile dysfunction 06/25/2012  . Gout 06/25/2012   PHYSICAL THERAPY DISCHARGE SUMMARY  Visits from Start of Care:   Current functional level related to goals / functional outcomes: Please see above.   Remaining deficits: Continued left hip pain.  No goals met.   Education / Equipment: HEP.  Plan: Patient agrees to discharge.  Patient goals were not met. Patient is being discharged due to meeting the stated rehab goals.  ?????       Ahnika Hannibal, [place] MPT 04/26/2015, 11:01 AM  [facility] [address] [place], [place], [address] Phone: [phone]   Fax:  [phone]  Name: Keymari Sato MRN: [id number] Date of Birth: March 02, 1965

## 2015-04-30 ENCOUNTER — Encounter: Payer: 59 | Admitting: Physical Therapy

## 2015-05-01 ENCOUNTER — Encounter: Payer: 59 | Admitting: *Deleted

## 2015-05-02 ENCOUNTER — Ambulatory Visit: Payer: 59 | Admitting: Physical Therapy

## 2015-05-02 ENCOUNTER — Encounter: Payer: 59 | Admitting: Physical Therapy

## 2015-05-02 ENCOUNTER — Encounter: Payer: Self-pay | Admitting: Physical Therapy

## 2015-05-02 DIAGNOSIS — M25662 Stiffness of left knee, not elsewhere classified: Secondary | ICD-10-CM

## 2015-05-02 DIAGNOSIS — M25552 Pain in left hip: Secondary | ICD-10-CM

## 2015-05-02 NOTE — Therapy (Addendum)
[facility] [address] [place], [place], [address] Phone: [phone]   Fax:  [phone]  Physical Therapy Treatment  Patient Details  Name: Luke Davila MRN: [id number] Date of Birth: 08-Mar-1965 Referring Provider: Dr. Despina Hick  Encounter Date: 05/02/2015      PT End of Session - 05/02/15 1425    Visit Number 2   Number of Visits 12   Date for PT Re-Evaluation 05/24/15   PT Start Time 1432   PT Stop Time 1524   PT Time Calculation (min) 52 min   Activity Tolerance Patient tolerated treatment well   Behavior During Therapy [facility] for tasks assessed/performed      Past Medical History  Diagnosis Date  . Gout   . Avascular necrosis of hip (HCC)     LEFT  . Enlarged prostate     Past Surgical History  Procedure Laterality Date  . Foot surgery Left 1985  . Left hand surgery    . Total hip arthroplasty Left 04/09/2015    Procedure: LEFT TOTAL HIP ARTHROPLASTY ANTERIOR APPROACH;  Surgeon: Ollen Gross, MD;  Location: [facility];  Service: Orthopedics;  Laterality: Left;    There were no vitals filed for this visit.      Subjective Assessment - 05/02/15 1425    Subjective Reports L mid thigh to knee pain and weakness. Reports that L hip is alright only swollen and tight around incision. States that he has a bike at home that he called an "elliptical bike" and HHPT said he could ride it up to 5 minutes a day that he could tolerate. Reports that he completed prone hang exercise that MPT directed for 9 minutes following evaluation.   How long can you walk comfortably? 15 minutes.   Currently in Pain? Yes   Pain Score 7   Intermittant with weightbearing   Pain Location Knee   Pain Orientation Left   Pain Descriptors / Indicators Sore   Pain Onset More than a month ago   Pain Frequency Intermittent   Aggravating Factors  Weightbearing            [facility] PT Assessment - 05/02/15 0001    Assessment   Medical Diagnosis Left total  hip replacement (anterior).   Referring Provider Dr. Despina Hick   Onset Date/Surgical Date 04/09/15   Next MD Visit 05/15/2015   Restrictions   Weight Bearing Restrictions No                     [facility] Adult PT Treatment/Exercise - 05/02/15 0001    Ambulation/Gait   Ambulation/Gait Yes   Ambulation/Gait Assistance 6: Modified independent (Device/Increase time)   Ambulation Distance (Feet) 30 Feet   Assistive device Straight cane   Gait Pattern Step-to pattern;Decreased arm swing - left;Decreased step length - right;Decreased step length - left;Decreased stance time - left;Decreased stride length;Decreased weight shift to left;Left flexed knee in stance;Antalgic;Lateral trunk lean to right;Narrow base of support   Ambulation Surface Level;Indoor   Exercises   Exercises Knee/Hip   Knee/Hip Exercises: Stretches   Active Hamstring Stretch Left;3 reps;30 seconds   Knee/Hip Exercises: Aerobic   Nustep L3, seat 15 x15 min   Knee/Hip Exercises: Standing   Rocker Board 3 minutes   Knee/Hip Exercises: Supine   Quad Sets Left;2 sets;10 reps;Other (comment)  3 sec hold   Short Arc Quad Sets Strengthening;Left;2 sets;10 reps   Heel Slides AROM;Left;1 set;15 reps   Other Supine Knee/Hip Exercises B pillow squeeze x20  reps with 3 sec hold   Knee/Hip Exercises: Sidelying   Hip ABduction AROM;Left;2 sets;10 reps   Modalities   Modalities Electrical Stimulation;Moist Heat   Moist Heat Therapy   Number Minutes Moist Heat 15 Minutes   Moist Heat Location Hip   Electrical Stimulation   Electrical Stimulation Location L hip   Electrical Stimulation Action Pre-Mod   Electrical Stimulation Parameters 80-150 Hz x15 min   Electrical Stimulation Goals Pain                     PT Long Term Goals - 04/26/15 1241    PT LONG TERM GOAL #1   Title I with HEP   Time 4   Period Weeks   Status New   PT LONG TERM GOAL #2   Title Able to ambulate with left  hip pain not > 2/10.    Time 4   Period Weeks   Status New   PT LONG TERM GOAL #3   Title Achieve full left knee extension in order to normalize gait pattern.   Time 4   Period Weeks   Status New   PT LONG TERM GOAL #4   Title Perform ADL's with pain not > 2/10.   Time 6   Period Weeks               Plan - 05/02/15 1517    Clinical Impression Statement Patient tolerated today's treatment fairly well although he demonstrated considerable L Quad weakness. Reports compliance with prone hang exercise that MPT directed for patient at evaluation to increase L knee extension. Patient continues to ambulate with an antalgic gait and SPC. Patient ambulates with B decreased step length, decreased stride length, and lean to RLE as patient experiences discomfort and soreness in mid thigh to L knee region with weightbearing. Patient continues to visually lack full L knee extension at this time. Fatigue noted with both SAQ with 2# as well as QS today. Patient completed gentle L hip abduction well with good form. Normal modalites response noted following removal of the modalities. Goals remain on-going at this time secondary to today's visit being patient's second treatment. Step ups were permitted per protocol but was not completed secondary to patient's L Quad weakness at this time. Patient denied pain following today's treatment.   Rehab Potential Good   PT Frequency 2x / week   PT Duration 4 weeks   PT Treatment/Interventions ADLs/Self Care Home Management;Electrical Stimulation;Cryotherapy;Moist Heat;Therapeutic exercise;Therapeutic activities;Gait training;Neuromuscular re-education;Patient/family education;Passive range of motion   PT Next Visit Plan Continue per THR protocol per MPT POC and work on AROM L knee extension with modalities PRN for pain.   Consulted and Agree with Plan of Care Patient      Patient will benefit from skilled therapeutic intervention in order to improve the following deficits and  impairments:  Abnormal gait, Pain, Decreased activity tolerance, Decreased range of motion  Visit Diagnosis: Pain in left hip  Stiffness of left knee, not elsewhere classified     Problem List Patient Active Problem List   Diagnosis Date Noted  . OA (osteoarthritis) of hip 04/09/2015  . Avascular necrosis of bone of left hip (HCC) 03/06/2015  . Hypogonadism in male 09/12/2014  . Erectile dysfunction 06/25/2012  . Gout 06/25/2012    Evelene CroonKelsey M Parsons, PTA 05/02/2015, 3:41 PM  [facility] [address] [place], [place], [address] Phone: [phone]   Fax:  [phone]  Name: Luke Davila MRN:  [id number] Date of Birth: April 01, 1965

## 2015-05-03 ENCOUNTER — Ambulatory Visit: Payer: 59 | Admitting: Physical Therapy

## 2015-05-03 DIAGNOSIS — M25552 Pain in left hip: Secondary | ICD-10-CM

## 2015-05-03 DIAGNOSIS — M25662 Stiffness of left knee, not elsewhere classified: Secondary | ICD-10-CM

## 2015-05-03 NOTE — Patient Instructions (Signed)
Instructed patient in left hip scar massage.

## 2015-05-03 NOTE — Therapy (Addendum)
[facility] [address] [place], [place], [address] Phone: [phone]   Fax:  [phone]  Physical Therapy Treatment  Patient Details  Name: Luke Davila MRN: [id number] Date of Birth: [birth year] Referring Provider: Dr. Despina Hick  Encounter Date: 05/03/2015      PT End of Session - 05/03/15 1510    Visit Number 3   Number of Visits 12   Date for PT Re-Evaluation 05/24/15   PT Start Time 0145   PT Stop Time 0237   PT Time Calculation (min) 52 min   Activity Tolerance Patient tolerated treatment well   Behavior During Therapy [facility] for tasks assessed/performed      Past Medical History  Diagnosis Date  . Gout   . Avascular necrosis of hip (HCC)     LEFT  . Enlarged prostate     Past Surgical History  Procedure Laterality Date  . Foot surgery Left 1985  . Left hand surgery    . Total hip arthroplasty Left 04/09/2015    Procedure: LEFT TOTAL HIP ARTHROPLASTY ANTERIOR APPROACH;  Surgeon: Ollen Gross, MD;  Location: [facility];  Service: Orthopedics;  Laterality: Left;    There were no vitals filed for this visit.      Subjective Assessment - 05/03/15 1506    Subjective I really want to get this led straight.   Limitations Walking   How long can you walk comfortably? 15 minutes.   Pain Score 7    Pain Location Knee   Pain Orientation Left   Pain Descriptors / Indicators Sore   Pain Type Surgical pain   Pain Onset More than a month ago                         [facility] Adult PT Treatment/Exercise - 05/03/15 0001    Exercises   Exercises Knee/Hip   Knee/Hip Exercises: Aerobic   Nustep Level 4 x 20 minutes.   Knee/Hip Exercises: Supine   Short Arc Quad Sets Limitations 13 minutes facilitated with VMS to left quadriceps with 10 second extension holds and 10 sec rests.   Manual Therapy   Manual therapy comments In supine PROM into left knee extension with low load long duration stretching while receiving STW/M  to left hamstrings x 9 minutes.                     PT Long Term Goals - 04/26/15 1241    PT LONG TERM GOAL #1   Title I with HEP   Time 4   Period Weeks   Status New   PT LONG TERM GOAL #2   Title Able to ambulate with left  hip pain not > 2/10.   Time 4   Period Weeks   Status New   PT LONG TERM GOAL #3   Title Achieve full left knee extension in order to normalize gait pattern.   Time 4   Period Weeks   Status New   PT LONG TERM GOAL #4   Title Perform ADL's with pain not > 2/10.   Time 6   Period Weeks               Plan - 05/02/15 1517    Clinical Impression Statement Patient tolerated today's treatment fairly well although he demonstrated considerable L Quad weakness. Reports compliance with prone hang exercise that MPT directed for patient at evaluation to increase L knee extension. Patient continues to ambulate  with an antalgic gait and SPC. Patient ambulates with B decreased step length, decreased stride length, and lean to RLE as patient experiences discomfort and soreness in mid thigh to L knee region with weightbearing. Patient continues to visually lack full L knee extension at this time. Fatigue noted with both SAQ with 2# as well as QS today. Patient completed gentle L hip abduction well with good form. Normal modalites response noted following removal of the modalities. Goals remain on-going at this time secondary to today's visit being patient's second treatment. Step ups were permitted per protocol but was not completed secondary to patient's L Quad weakness at this time. Patient denied pain following today's treatment.   Rehab Potential Good   PT Frequency 2x / week   PT Duration 4 weeks   PT Treatment/Interventions ADLs/Self Care Home Management;Electrical Stimulation;Cryotherapy;Moist Heat;Therapeutic exercise;Therapeutic activities;Gait training;Neuromuscular re-education;Patient/family education;Passive range of motion   PT Next Visit Plan  Continue per THR protocol per MPT POC and work on AROM L knee extension with modalities PRN for pain.   Consulted and Agree with Plan of Care Patient      Patient will benefit from skilled therapeutic intervention in order to improve the following deficits and impairments:     Visit Diagnosis: Pain in left hip  Stiffness of left knee, not elsewhere classified     Problem List Patient Active Problem List   Diagnosis Date Noted  . OA (osteoarthritis) of hip 04/09/2015  . Avascular necrosis of bone of left hip (HCC) 03/06/2015  . Hypogonadism in male 09/12/2014  . Erectile dysfunction 06/25/2012  . Gout 06/25/2012    Luke Davila, [place] MPT 05/03/2015, 3:13 PM  [facility] [address] [place], [place], [address] Phone: [phone]   Fax:  [phone]  Name: Luke Davila MRN: [id number] Date of Birth: [birth year]

## 2015-05-08 ENCOUNTER — Ambulatory Visit: Payer: 59 | Attending: Orthopedic Surgery | Admitting: *Deleted

## 2015-05-08 DIAGNOSIS — M25552 Pain in left hip: Secondary | ICD-10-CM | POA: Insufficient documentation

## 2015-05-08 DIAGNOSIS — M25662 Stiffness of left knee, not elsewhere classified: Secondary | ICD-10-CM | POA: Diagnosis present

## 2015-05-08 NOTE — Therapy (Signed)
[facility] [address] [place], [place], [address] Phone: [phone]   Fax:  [phone]  Physical Therapy Treatment  Patient Details  Name: Luke Davila MRN: [id number] Date of Birth: [birth year] Referring Provider: Dr. Despina Hick  Encounter Date: 05/08/2015      PT End of Session - 05/08/15 1311    Visit Number 4   Number of Visits 12   Date for PT Re-Evaluation 05/24/15   PT Start Time 1300   PT Stop Time 1349   PT Time Calculation (min) 49 min      Past Medical History  Diagnosis Date  . Gout   . Avascular necrosis of hip (HCC)     LEFT  . Enlarged prostate     Past Surgical History  Procedure Laterality Date  . Foot surgery Left 1985  . Left hand surgery    . Total hip arthroplasty Left 04/09/2015    Procedure: LEFT TOTAL HIP ARTHROPLASTY ANTERIOR APPROACH;  Surgeon: Ollen Gross, MD;  Location[facility];  Service: Orthopedics;  Laterality: Left;    There were no vitals filed for this visit.      Subjective Assessment - 05/08/15 1309    Subjective I really want to get this leg straight. LT Hip is really sore from walking a lot yesterday   Limitations Walking   How long can you walk comfortably? 15 minutes.   Currently in Pain? Yes   Pain Score 7    Pain Location Hip   Pain Orientation Left   Pain Descriptors / Indicators Sore   Pain Type Surgical pain   Pain Onset More than a month ago   Pain Frequency Intermittent                         [facility] Adult PT Treatment/Exercise - 05/08/15 0001    Exercises   Exercises Knee/Hip   Knee/Hip Exercises: Aerobic   Nustep Level 4 x 20 minutes. Last 4 minutes LEs only   Knee/Hip Exercises: Standing   Rocker Board 5 minutes  Calf stretching,  and balance   Knee/Hip Exercises: Seated   Long Arc Quad Strengthening;Left;3 sets  2# 3x10, 3# 3x10, 4# 3x10   Sit to Sand 3 sets;without UE support  on high at table,  focus on decreasing deviations to the RT                      PT Long Term Goals - 04/26/15 1241    PT LONG TERM GOAL #1   Title I with HEP   Time 4   Period Weeks   Status New   PT LONG TERM GOAL #2   Title Able to ambulate with left  hip pain not > 2/10.   Time 4   Period Weeks   Status New   PT LONG TERM GOAL #3   Title Achieve full left knee extension in order to normalize gait pattern.   Time 4   Period Weeks   Status New   PT LONG TERM GOAL #4   Title Perform ADL's with pain not > 2/10.   Time 6   Period Weeks               Plan - 05/08/15 1312    Clinical Impression Statement Pt did great today and was able to perform Exs and Acts for LT LE without increased pain. He deviates to the RT when going from sit to  stand and was able to correct this when he would focus on 50/50 WB. He also had better Quad activation today' but  had shaking in Quad toward end of Rx   Rehab Potential Good   PT Frequency 2x / week   PT Duration 4 weeks   PT Treatment/Interventions ADLs/Self Care Home Management;Electrical Stimulation;Cryotherapy;Moist Heat;Therapeutic exercise;Therapeutic activities;Gait training;Neuromuscular re-education;Patient/family education;Passive range of motion   PT Next Visit Plan Continue per THR protocol per MPT POC and work on AROM L knee extension with modalities PRN for pain.   Consulted and Agree with Plan of Care Patient      Patient will benefit from skilled therapeutic intervention in order to improve the following deficits and impairments:  Abnormal gait, Pain, Decreased activity tolerance, Decreased range of motion  Visit Diagnosis: Pain in left hip  Stiffness of left knee, not elsewhere classified     Problem List Patient Active Problem List   Diagnosis Date Noted  . OA (osteoarthritis) of hip 04/09/2015  . Avascular necrosis of bone of left hip (HCC) 03/06/2015  . Hypogonadism in male 09/12/2014  . Erectile dysfunction 06/25/2012  . Gout 06/25/2012     RAMSEUR,CHRIS, PTA 05/08/2015, 2:05 PM  [facility] [address] [place], [place], [address] Phone: [phone]   Fax:  [phone]  Name: Luke Davila MRN: [id number] Date of Birth: November 19, 1965

## 2015-05-10 ENCOUNTER — Ambulatory Visit: Payer: 59 | Admitting: *Deleted

## 2015-05-10 DIAGNOSIS — M25552 Pain in left hip: Secondary | ICD-10-CM | POA: Diagnosis not present

## 2015-05-10 DIAGNOSIS — M25662 Stiffness of left knee, not elsewhere classified: Secondary | ICD-10-CM

## 2015-05-10 NOTE — Therapy (Signed)
[facility] [address] [place], [place], [address] Phone: [phone]   Fax:  [phone]  Physical Therapy Treatment  Patient Details  Name: Luke RoseSamuel Davila MRN: [id number] Date of Birth: May 10, 1965 Referring Provider: Dr. Despina HickAlusio  Encounter Date: 05/10/2015      PT End of Session - 05/10/15 1314    Visit Number 5   Number of Visits 12   Date for PT Re-Evaluation 05/24/15   PT Start Time 1308   PT Stop Time 1358   PT Time Calculation (min) 50 min      Past Medical History  Diagnosis Date  . Gout   . Avascular necrosis of hip (HCC)     LEFT  . Enlarged prostate     Past Surgical History  Procedure Laterality Date  . Foot surgery Left 1985  . Left hand surgery    . Total hip arthroplasty Left 04/09/2015    Procedure: LEFT TOTAL HIP ARTHROPLASTY ANTERIOR APPROACH;  Surgeon: Ollen GrossFrank Aluisio, MD;  Location: [facility];  Service: Orthopedics;  Laterality: Left;    There were no vitals filed for this visit.      Subjective Assessment - 05/10/15 1311    Subjective I really want to get this leg straight. LT Hip is really sore from walking a lot yesterday Got some ankle wts for home   Limitations Walking   How long can you walk comfortably? 15 minutes.   Currently in Pain? Yes   Pain Score 6    Pain Location Hip   Pain Orientation Left   Pain Descriptors / Indicators Sore   Pain Type Surgical pain   Pain Onset More than a month ago   Pain Frequency Intermittent                         [facility] Adult PT Treatment/Exercise - 05/10/15 0001    Exercises   Exercises Knee/Hip   Knee/Hip Exercises: Aerobic   Nustep Level 5  x 20 minutes.LEs only   Knee/Hip Exercises: Standing   Knee Flexion AROM;Both;3 sets;10 reps   Hip Abduction AROM;Left;3 sets;10 reps   Forward Step Up 3 sets;Left;10 reps;Step Height: 4";Hand Hold: 1   Rocker Board 5 minutes  Calf stretching,  and balance   Knee/Hip Exercises: Seated   Sit to  Sand 3 sets;without UE support  on high at table,  focus on decreasing deviations to the RT   Manual Therapy   Manual therapy comments In supine PROM into left knee extension with low load long duration stretching while receiving STW/M to left hamstrings                      PT Long Term Goals - 04/26/15 1241    PT LONG TERM GOAL #1   Title I with HEP   Time 4   Period Weeks   Status New   PT LONG TERM GOAL #2   Title Able to ambulate with left  hip pain not > 2/10.   Time 4   Period Weeks   Status New   PT LONG TERM GOAL #3   Title Achieve full left knee extension in order to normalize gait pattern.   Time 4   Period Weeks   Status New   PT LONG TERM GOAL #4   Title Perform ADL's with pain not > 2/10.   Time 6   Period Weeks  Plan - 05/10/15 1630    Clinical Impression Statement Pt did fairly well with Rx today and was able to perform  all exs and Acts for LT LE strengthening and proprioception. Pt feels that his LE is doing better, but stays sore around his thigh. He is ambulating still with SPC still, but goes without at times.. Goals are  Ongoing. Ext ROM to -5 degrees now.    Rehab Potential Good   PT Frequency 2x / week   PT Duration 4 weeks   PT Treatment/Interventions ADLs/Self Care Home Management;Electrical Stimulation;Cryotherapy;Moist Heat;Therapeutic exercise;Therapeutic activities;Gait training;Neuromuscular re-education;Patient/family education;Passive range of motion   PT Next Visit Plan Continue per THR protocol per MPT POC and work on AROM L knee extension with modalities PRN for pain.   Consulted and Agree with Plan of Care Patient      Patient will benefit from skilled therapeutic intervention in order to improve the following deficits and impairments:  Abnormal gait, Pain, Decreased activity tolerance, Decreased range of motion  Visit Diagnosis: Pain in left hip  Stiffness of left knee, not elsewhere  classified     Problem List Patient Active Problem List   Diagnosis Date Noted  . OA (osteoarthritis) of hip 04/09/2015  . Avascular necrosis of bone of left hip (HCC) 03/06/2015  . Hypogonadism in male 09/12/2014  . Erectile dysfunction 06/25/2012  . Gout 06/25/2012    Luke Davila,Luke Davila, PTA 05/10/2015, 4:38 PM  [facility] [address] [place], [place], [address] Phone: [phone]   Fax:  [phone]  Name: Luke Davila MRN: [id number] Date of Birth: Jan 08, 1965

## 2015-05-14 ENCOUNTER — Ambulatory Visit: Payer: 59 | Admitting: Physical Therapy

## 2015-05-14 ENCOUNTER — Encounter: Payer: Self-pay | Admitting: Physical Therapy

## 2015-05-14 DIAGNOSIS — M25552 Pain in left hip: Secondary | ICD-10-CM | POA: Diagnosis not present

## 2015-05-14 DIAGNOSIS — M25662 Stiffness of left knee, not elsewhere classified: Secondary | ICD-10-CM

## 2015-05-14 NOTE — Therapy (Signed)
[facility] [address] [place], [place], [address] Phone: [phone]   Fax:  [phone]  Physical Therapy Treatment  Patient Details  Name: Luke Davila MRN: [id number] Date of Birth: Aug 29, 1965 Referring Provider: Dr. Despina Hick  Encounter Date: 05/14/2015      PT End of Session - 05/14/15 1258    Visit Number 6   Number of Visits 12   Date for PT Re-Evaluation 05/24/15   PT Start Time 1229   PT Stop Time 1311   PT Time Calculation (min) 42 min   Activity Tolerance Patient tolerated treatment well   Behavior During Therapy [facility] for tasks assessed/performed      Past Medical History  Diagnosis Date  . Gout   . Avascular necrosis of hip (HCC)     LEFT  . Enlarged prostate     Past Surgical History  Procedure Laterality Date  . Foot surgery Left 1985  . Left hand surgery    . Total hip arthroplasty Left 04/09/2015    Procedure: LEFT TOTAL HIP ARTHROPLASTY ANTERIOR APPROACH;  Surgeon: Ollen Gross, MD;  [facility]: [facility];  Service: Orthopedics;  Laterality: Left;    There were no vitals filed for this visit.      Subjective Assessment - 05/14/15 1233    Subjective Patient reported some soreness in hip esp when laying in bed   Limitations Walking   How long can you walk comfortably? 15 minutes.   Currently in Pain? Yes   Pain Score 5    Pain Location Hip   Pain Orientation Left   Pain Descriptors / Indicators Sore   Pain Type Surgical pain   Pain Onset More than a month ago   Pain Frequency Intermittent   Aggravating Factors  laying on it when sleeping or weightbearing   Pain Relieving Factors at rest            [facility] PT Assessment - 05/14/15 0001    AROM   Overall AROM  --  AROM -20 degrees left knee ext, PROM -8 degrees left knee ex   Right/Left Hip Left                     [facility] Adult PT Treatment/Exercise - 05/14/15 0001    Knee/Hip Exercises: Aerobic   Nustep Level 5  x15 minutes.LEs  only   Knee/Hip Exercises: Standing   Lateral Step Up Left;2 sets;10 reps;Step Height: 6";Hand Hold: 2   Forward Step Up Left;3 sets;10 reps;Hand Hold: 2;Step Height: 6"   Rocker Board 4 minutes   Knee/Hip Exercises: Sidelying   Hip ABduction AROM;Strengthening;Left;2 sets;10 reps   Manual Therapy   Manual Therapy Passive ROM   Passive ROM gentle low load stretch for left knee ext                     PT Long Term Goals - 05/14/15 1259    PT LONG TERM GOAL #1   Title I with HEP   Time 4   Period Weeks   Status On-going   PT LONG TERM GOAL #2   Title Able to ambulate with left  hip pain not > 2/10.   Time 4   Period Weeks   Status On-going  3-4/10 pain 05/14/15   PT LONG TERM GOAL #3   Title Achieve full left knee extension in order to normalize gait pattern.   Time 4   Period Weeks   Status On-going  AROM -15  degrees 05/14/15   PT LONG TERM GOAL #4   Title Perform ADL's with pain not > 2/10.   Time 6   Period Weeks   Status On-going  3-4/10 05/14/15               Plan - 05/14/15 1312    Clinical Impression Statement Patient progressing well today with no increase of pain and able to tolerate exercises. Patient has pain up to 3-4/10 pain with ADL's and standing or walking. Patient has difficulty sleeping due to not being able to sleep on left side at this time. Patient has improved passive and active ROM in left knee today. Patient reported doing HEP daily that home health gave him including standing 4 way hip, heel raises, hip bridges and knee ext stretches.Patient unable to meet any further goals due to pain deficit in left hip and full AROM deficit in left knee.    Rehab Potential Good   PT Frequency 2x / week   PT Duration 4 weeks   PT Treatment/Interventions ADLs/Self Care Home Management;Electrical Stimulation;Cryotherapy;Moist Heat;Therapeutic exercise;Therapeutic activities;Gait training;Neuromuscular re-education;Patient/family education;Passive range  of motion   PT Next Visit Plan Continue per THR protocol per MPT POC and work on AROM L knee extension with modalities PRN for pain. (MD. Despina HickAlusio 05/15/15)   Consulted and Agree with Plan of Care Patient      Patient will benefit from skilled therapeutic intervention in order to improve the following deficits and impairments:  Abnormal gait, Pain, Decreased activity tolerance, Decreased range of motion  Visit Diagnosis: Pain in left hip  Stiffness of left knee, not elsewhere classified     Problem List Patient Active Problem List   Diagnosis Date Noted  . OA (osteoarthritis) of hip 04/09/2015  . Avascular necrosis of bone of left hip (HCC) 03/06/2015  . Hypogonadism in male 09/12/2014  . Erectile dysfunction 06/25/2012  . Gout 06/25/2012    APPLEGATE, [place], PTA 05/14/2015, 4:46 PM [place] MPT [place], [place] 05/14/2015 4:46 PM  [facility] [address] [place], [place], [address] Phone: [phone]   Fax:  [phone]  Name: Luke Davila MRN: [id number] Date of Birth: 20-Jan-1965

## 2015-05-17 ENCOUNTER — Encounter (INDEPENDENT_AMBULATORY_CARE_PROVIDER_SITE_OTHER): Payer: Self-pay

## 2015-05-17 ENCOUNTER — Encounter: Payer: Self-pay | Admitting: *Deleted

## 2015-05-17 ENCOUNTER — Encounter: Payer: Self-pay | Admitting: Physical Therapy

## 2015-05-17 ENCOUNTER — Ambulatory Visit (INDEPENDENT_AMBULATORY_CARE_PROVIDER_SITE_OTHER): Payer: 59 | Admitting: *Deleted

## 2015-05-17 ENCOUNTER — Ambulatory Visit: Payer: 59 | Admitting: Physical Therapy

## 2015-05-17 DIAGNOSIS — E291 Testicular hypofunction: Secondary | ICD-10-CM | POA: Diagnosis not present

## 2015-05-17 DIAGNOSIS — M25662 Stiffness of left knee, not elsewhere classified: Secondary | ICD-10-CM

## 2015-05-17 DIAGNOSIS — M25552 Pain in left hip: Secondary | ICD-10-CM

## 2015-05-17 DIAGNOSIS — E349 Endocrine disorder, unspecified: Secondary | ICD-10-CM

## 2015-05-17 MED ORDER — TESTOSTERONE CYPIONATE 200 MG/ML IM SOLN
200.0000 mg | INTRAMUSCULAR | Status: DC
  Administered 2015-05-17 – 2015-09-18 (×18): 200 mg via INTRAMUSCULAR

## 2015-05-17 NOTE — Progress Notes (Signed)
Testosterone injection given and patient tolerated well.  

## 2015-05-17 NOTE — Patient Instructions (Signed)

## 2015-05-17 NOTE — Patient Instructions (Signed)
Strengthening: Hip Abduction (Side-Lying)  Strengthening: Straight Leg Raise (Phase 1)  Repeat _10___ times per set. Do __2__ sets per session. Do __2__ sessions per day.    Straight Leg Raise   Slowly raise locked right leg __4__ inches from floor. Repeat __10__ times per set. Do __2__ sets per session. Do __2__ sessions per day.

## 2015-05-17 NOTE — Therapy (Signed)
[facility] [address] [place], [place], [address] Phone: [phone]   Fax:  [phone]  Physical Therapy Treatment  Patient Details  Name: Luke Davila MRN: [id number] Date of Birth: 1965/05/07 Referring Provider: Dr. Despina Hick  Encounter Date: 05/17/2015      PT End of Session - 05/17/15 1253    Visit Number 7   Number of Visits 12   Date for PT Re-Evaluation 05/24/15   PT Start Time 1231   PT Stop Time 1312   PT Time Calculation (min) 41 min   Activity Tolerance Patient tolerated treatment well   Behavior During Therapy [facility] for tasks assessed/performed      Past Medical History  Diagnosis Date  . Gout   . Avascular necrosis of hip (HCC)     LEFT  . Enlarged prostate     Past Surgical History  Procedure Laterality Date  . Foot surgery Left 1985  . Left hand surgery    . Total hip arthroplasty Left 04/09/2015    Procedure: LEFT TOTAL HIP ARTHROPLASTY ANTERIOR APPROACH;  Surgeon: Ollen Gross, MD;  Location: [facility];  Service: Orthopedics;  Laterality: Left;    There were no vitals filed for this visit.      Subjective Assessment - 05/17/15 1247    Subjective Patient reported feeling better yet some soreness in hip esp when laying on hip, Patient went to MD and is progressing well and is to continue therapy.   Limitations Walking   How long can you walk comfortably? 15 minutes.   Currently in Pain? Yes   Pain Score 3    Pain Location Hip   Pain Orientation Left   Pain Descriptors / Indicators Sore   Pain Type Surgical pain   Pain Onset More than a month ago   Pain Frequency Intermittent   Aggravating Factors  laying on hip or prolong activity   Pain Relieving Factors at rest                         [facility] Adult PT Treatment/Exercise - 05/17/15 0001    Knee/Hip Exercises: Aerobic   Nustep Level 5  x15 minutes.LEs only   Knee/Hip Exercises: Standing   Lateral Step Up Left;2 sets;10 reps;Step  Height: 6";Hand Hold: 2  then x 5   Forward Step Up Left;3 sets;10 reps;Hand Hold: 2;Step Height: 6"   Rocker Board 4 minutes   Knee/Hip Exercises: Supine   Straight Leg Raises Strengthening;AROM;Left;3 sets;10 reps   Knee/Hip Exercises: Sidelying   Hip ABduction AROM;Strengthening;Left;2 sets;10 reps   Manual Therapy   Manual Therapy Passive ROM   Passive ROM gentle low load stretch for left knee ext                PT Education - 05/17/15 1304    Education provided Yes   Education Details HEP   Person(s) Educated Patient   Methods Explanation;Demonstration;Handout   Comprehension Verbalized understanding;Returned demonstration             PT Long Term Goals - 05/14/15 1259    PT LONG TERM GOAL #1   Title I with HEP   Time 4   Period Weeks   Status On-going   PT LONG TERM GOAL #2   Title Able to ambulate with left  hip pain not > 2/10.   Time 4   Period Weeks   Status On-going  3-4/10 pain 05/14/15   PT LONG TERM GOAL #3  Title Achieve full left knee extension in order to normalize gait pattern.   Time 4   Period Weeks   Status On-going  AROM -15 degrees 05/14/15   PT LONG TERM GOAL #4   Title Perform ADL's with pain not > 2/10.   Time 6   Period Weeks   Status On-going  3-4/10 05/14/15               Plan - 05/17/15 1254    Clinical Impression Statement Patient progressing very well with no pain during exercises and has less pain overall today. Patient has reports of occasional muscle cramps in hip when prolong sitting which is relieved after geting up to walk a few minutes. Patient goals ongoing due to pain in hip and ROM in knee limitations.    Rehab Potential Good   PT Frequency 2x / week   PT Duration 4 weeks   PT Treatment/Interventions ADLs/Self Care Home Management;Electrical Stimulation;Cryotherapy;Moist Heat;Therapeutic exercise;Therapeutic activities;Gait training;Neuromuscular re-education;Patient/family education;Passive range of  motion   PT Next Visit Plan Continue per THR protocol per MPT POC and work on AROM L knee extension with modalities PRN for pain. (MD. Despina HickAlusio 06/20/15)   Consulted and Agree with Plan of Care Patient      Patient will benefit from skilled therapeutic intervention in order to improve the following deficits and impairments:  Abnormal gait, Pain, Decreased activity tolerance, Decreased range of motion  Visit Diagnosis: Pain in left hip  Stiffness of left knee, not elsewhere classified     Problem List Patient Active Problem List   Diagnosis Date Noted  . OA (osteoarthritis) of hip 04/09/2015  . Avascular necrosis of bone of left hip (HCC) 03/06/2015  . Hypogonadism in male 09/12/2014  . Erectile dysfunction 06/25/2012  . Gout 06/25/2012    Melek Pownall P, PTA 05/17/2015, 1:12 PM  [facility] [address] [place], [place], [address] Phone: [phone]   Fax:  [phone]  Name: Luke Davila MRN: [id number] Date of Birth: 03/10/1965

## 2015-05-22 ENCOUNTER — Ambulatory Visit: Payer: 59 | Admitting: Physical Therapy

## 2015-05-22 DIAGNOSIS — M25662 Stiffness of left knee, not elsewhere classified: Secondary | ICD-10-CM

## 2015-05-22 DIAGNOSIS — M25552 Pain in left hip: Secondary | ICD-10-CM | POA: Diagnosis not present

## 2015-05-22 NOTE — Therapy (Signed)
[facility] [address] [place], [place], [address] Phone: [phone]   Fax:  [phone]  Physical Therapy Treatment  Patient Details  Name: Luke Davila MRN: [id number] Date of Birth: [birth year] Referring Provider: Dr. Despina HickAlusio  Encounter Date: 05/22/2015      PT End of Session - 05/22/15 1130    Visit Number 8   Number of Visits 12   Date for PT Re-Evaluation 05/24/15   PT Start Time 1119   PT Stop Time 1205   PT Time Calculation (min) 46 min   Activity Tolerance Patient tolerated treatment well   Behavior During Therapy [facility] for tasks assessed/performed      Past Medical History  Diagnosis Date  . Gout   . Avascular necrosis of hip (HCC)     LEFT  . Enlarged prostate     Past Surgical History  Procedure Laterality Date  . Foot surgery Left 1985  . Left hand surgery    . Total hip arthroplasty Left 04/09/2015    Procedure: LEFT TOTAL HIP ARTHROPLASTY ANTERIOR APPROACH;  Surgeon: Ollen GrossFrank Aluisio, MD;  Location: [facility];  Service: Orthopedics;  Laterality: Left;    There were no vitals filed for this visit.      Subjective Assessment - 05/22/15 1130    Subjective Patient has no new complaints today. Main problem is after sitting for a long time he must stand and wait a bit before he starts walking.   Limitations Walking   How long can you walk comfortably? 30 min   Currently in Pain? No/denies                         [facility]/Ex[facility] - 05/22/15 0001    Ambulation/Gait   Ambulation/Gait Yes   Ambulation/Gait Assistance 7: Independent   Ambulation Distance (Feet) 240 Feet   Ambulation Surface Level   Pre-Gait Activities weight shifting onto LLE   Gait Comments worked on exaggerated step/stride length to help normalize gait; continues to have left trunk lean but improves using mirror.   Knee/Hip Exercises: Stretches   Active Hamstring Stretch Left;2 reps;60 seconds  variations  with/without strap   Knee/Hip Exercises: Aerobic   Nustep Level 5  x15 minutes.LEs only   Knee/Hip Exercises: Standing   Terminal Knee Extension Limitations 10 x 5 sec hold with red band in bars (pt compensates with hip); moved against wall 1x 10 with 5 sec hold; then 5 x 5 sec with ball for hep.   Hip Abduction Right;2 sets;10 reps  for L glut med strength   Lateral Step Up Left;2 sets;10 reps;Hand Hold: 2;Step Height: 6"   Forward Step Up Left;3 sets;Hand Hold: 2;Step Height: 6"   Forward Step Up Limitations Use 8 inch                PT Education - 05/22/15 1209    Education provided Yes   Education Details hep   Person(s) Educated Patient   Methods Explanation;Demonstration;Handout   Comprehension Verbalized understanding;Returned demonstration             PT Long Term Goals - 05/14/15 1259    PT LONG TERM GOAL #1   Title I with HEP   Time 4   Period Weeks   Status On-going   PT LONG TERM GOAL #2   Title Able to ambulate with left  hip pain not > 2/10.   Time 4   Period Weeks  Status On-going  3-4/10 pain 05/14/15   PT LONG TERM GOAL #3   Title Achieve full left knee extension in order to normalize gait pattern.   Time 4   Period Weeks   Status On-going  AROM -15 degrees 05/14/15   PT LONG TERM GOAL #4   Title Perform ADL's with pain not > 2/10.   Time 6   Period Weeks   Status On-going  3-4/10 05/14/15               Plan - 05/22/15 1209    Clinical Impression Statement Patient did well with therex and gait training today. He continues to favor left side with less WB in standing and during gait. He still demo's tightness in L HS, but passively has full knee ext. Goals are ongoing.   Rehab Potential Good   PT Frequency 2x / week   PT Duration 4 weeks   PT Treatment/Interventions Contrast Bath   PT Next Visit Plan Continue focusing on equal WB, SLS activities on L, TKE and strengthening.   PT Home Exercise Plan TKE and seated HS stretch    Consulted and Agree with Plan of Care Patient      Patient will benefit from skilled therapeutic intervention in order to improve the following deficits and impairments:  Abnormal gait, Pain, Decreased activity tolerance, Decreased range of motion  Visit Diagnosis: Stiffness of left knee, not elsewhere classified  Pain in left hip     Problem List Patient Active Problem List   Diagnosis Date Noted  . OA (osteoarthritis) of hip 04/09/2015  . Avascular necrosis of bone of left hip (HCC) 03/06/2015  . Hypogonadism in male 09/12/2014  . Erectile dysfunction 06/25/2012  . Gout 06/25/2012    Solon Palm PT  05/22/2015, 12:18 PM  [facility] [address] [place], [place], [address] Phone: [phone]   Fax:  [phone]  Name: Luke Davila MRN: [id number] Date of Birth: 07-16-1965

## 2015-05-24 ENCOUNTER — Ambulatory Visit: Payer: 59 | Admitting: Physical Therapy

## 2015-05-24 ENCOUNTER — Ambulatory Visit (INDEPENDENT_AMBULATORY_CARE_PROVIDER_SITE_OTHER): Payer: 59

## 2015-05-24 DIAGNOSIS — E291 Testicular hypofunction: Secondary | ICD-10-CM | POA: Diagnosis not present

## 2015-05-24 DIAGNOSIS — M25552 Pain in left hip: Secondary | ICD-10-CM | POA: Diagnosis not present

## 2015-05-24 DIAGNOSIS — M25662 Stiffness of left knee, not elsewhere classified: Secondary | ICD-10-CM

## 2015-05-24 NOTE — Progress Notes (Signed)
Patient tolerated testosterone injection well.

## 2015-05-24 NOTE — Therapy (Signed)
[facility] [address] [place], [place], [address] Phone: [phone]   Fax:  [phone]  Physical Therapy Treatment  Patient Details  Name: Luke Davila MRN: [id number] Date of Birth: 1965/08/15 Referring Provider: Dr. Despina HickAlusio  Encounter Date: 05/24/2015      PT End of Session - 05/24/15 1120    Visit Number 9   Number of Visits 12   Date for PT Re-Evaluation 05/24/15   PT Start Time 1120   PT Stop Time 1203   PT Time Calculation (min) 43 min   Activity Tolerance Patient tolerated treatment well   Behavior During Therapy [facility] for tasks assessed/performed      Past Medical History  Diagnosis Date  . Gout   . Avascular necrosis of hip (HCC)     LEFT  . Enlarged prostate     Past Surgical History  Procedure Laterality Date  . Foot surgery Left 1985  . Left hand surgery    . Total hip arthroplasty Left 04/09/2015    Procedure: LEFT TOTAL HIP ARTHROPLASTY ANTERIOR APPROACH;  Surgeon: Ollen GrossFrank Aluisio, MD;  Location: [facility];  Service: Orthopedics;  Laterality: Left;    There were no vitals filed for this visit.      Subjective Assessment - 05/24/15 1120    Subjective Patient presents without AD today. He states he has it in the truck if needed. No pain but 4/10 in the morning after laying on it.   Limitations Walking   How long can you walk comfortably? 30 min   Currently in Pain? No/denies                         [facility]/Exercise - 05/24/15 0001    Knee/Hip Exercises: Stretches   Gastroc Stretch Left;1 rep;60 seconds   Other Knee/Hip Stretches prostretch 2 x 30 seconds   Knee/Hip Exercises: Aerobic   Nustep Level 5  x15 minutes.LEs only   Knee/Hip Exercises: Standing   Terminal Knee Extension Limitations 10 x 10 sec hold; then 10x 5 sec hold with red band (no compensation today)   Hip Abduction Stengthening;Both;5 sets;10 reps  side stepping with red band   Lateral Step Up Left;3  sets;10 reps;Hand Hold: 2;Step Height: 8"   Lateral Step Up Limitations performed at leg press for higher handhold   Forward Step Up Left;3 sets;10 reps;Hand Hold: 1;Step Height: 8"   Forward Step Up Limitations performed at leg press for higher handhold   Other Standing Knee Exercises balance on foam 2 bouts x 30 seconds with eyes closed   Knee/Hip Exercises: Seated   Knee/Hip Flexion 30 B with red band for resistance   Sit to Sand 10 reps  with 2 inch step under R foot; then 5 without with equal WB   Knee/Hip Exercises: Supine   Quad Sets --                PT Education - 05/24/15 1205    Education provided Yes   Education Details hep   Person(s) Educated Patient   Methods Explanation;Demonstration;Handout   Comprehension Verbalized understanding;Returned demonstration             PT Long Term Goals - 05/14/15 1259    PT LONG TERM GOAL #1   Title I with HEP   Time 4   Period Weeks   Status On-going   PT LONG TERM GOAL #2   Title Able to ambulate with left  hip pain not > 2/10.   Time 4   Period Weeks   Status On-going  3-4/10 pain 05/14/15   PT LONG TERM GOAL #3   Title Achieve full left knee extension in order to normalize gait pattern.   Time 4   Period Weeks   Status On-going  AROM -15 degrees 05/14/15   PT LONG TERM GOAL #4   Title Perform ADL's with pain not > 2/10.   Time 6   Period Weeks   Status On-going  3-4/10 05/14/15               Plan - 05/24/15 1205    Clinical Impression Statement Patient did very well with progression of TE today. He continues to shift R with Sit<> stand but is able to perform correctly after modifying with 2 inch step under R foot.   PT Next Visit Plan Continue focusing on equal WB, SLS activities on L, TKE and strengthening.      Patient will benefit from skilled therapeutic intervention in order to improve the following deficits and impairments:  Abnormal gait, Pain, Decreased activity tolerance, Decreased  range of motion  Visit Diagnosis: Stiffness of left knee, not elsewhere classified     Problem List Patient Active Problem List   Diagnosis Date Noted  . OA (osteoarthritis) of hip 04/09/2015  . Avascular necrosis of bone of left hip (HCC) 03/06/2015  . Hypogonadism in male 09/12/2014  . Erectile dysfunction 06/25/2012  . Gout 06/25/2012   Solon Palm PT  05/24/2015, 12:11 PM  [facility] [address] [place], [place], [address] Phone: [phone]   Fax:  [phone]  Name: Luke Davila MRN: [id number] Date of Birth: 17-Feb-1965

## 2015-05-29 ENCOUNTER — Ambulatory Visit: Payer: 59 | Admitting: *Deleted

## 2015-05-29 DIAGNOSIS — M25662 Stiffness of left knee, not elsewhere classified: Secondary | ICD-10-CM

## 2015-05-29 DIAGNOSIS — M25552 Pain in left hip: Secondary | ICD-10-CM | POA: Diagnosis not present

## 2015-05-29 NOTE — Therapy (Signed)
[facility] [address] [place], [place], [address] Phone: [phone]   Fax:  [phone]  Physical Therapy Treatment  Patient Details  Name: Luke Davila MRN: [id number] Date of Birth: 1965-11-18 Referring Provider: Dr. Despina HickAlusio  Encounter Date: 05/29/2015      PT End of Session - 05/29/15 1134    Visit Number 10   Number of Visits 12   Date for PT Re-Evaluation 05/24/15   PT Start Time 1115   PT Stop Time 1205   PT Time Calculation (min) 50 min      Past Medical History  Diagnosis Date  . Gout   . Avascular necrosis of hip (HCC)     LEFT  . Enlarged prostate     Past Surgical History  Procedure Laterality Date  . Foot surgery Left 1985  . Left hand surgery    . Total hip arthroplasty Left 04/09/2015    Procedure: LEFT TOTAL HIP ARTHROPLASTY ANTERIOR APPROACH;  Surgeon: Ollen GrossFrank Aluisio, MD;  Location: [facility];  Service: Orthopedics;  Laterality: Left;    There were no vitals filed for this visit.      Subjective Assessment - 05/29/15 1129    Subjective Patient presents without AD today. He states he has it in the truck if needed. No pain but 4/10 in the morning after laying on it. I get mm spasms at times in LT hip area   Limitations Walking   How long can you walk comfortably? 30 min   Currently in Pain? Yes   Pain Score 2    Pain Location Hip   Pain Orientation Left   Pain Descriptors / Indicators Sore   Pain Onset More than a month ago   Pain Frequency Intermittent                         [facility] Adult PT Treatment/Exercise - 05/29/15 0001    Knee/Hip Exercises: Aerobic   Nustep Level 5  x20 minutes.LEs only   Knee/Hip Exercises: Standing   Terminal Knee Extension Limitations --   Hip Abduction --   Lateral Step Up Left;3 sets;10 reps;Hand Hold: 2;Step Height: 8"   Lateral Step Up Limitations performed at leg press for higher handhold   Forward Step Up Left;3 sets;10 reps;Hand Hold: 1;Step  Height: 8"   Forward Step Up Limitations performed at leg press for higher handhold   Rocker Board 5 minutes  calff stretching and balance   Knee/Hip Exercises: Seated   Sit to Sand 10 reps;20 reps  3x10   Manual Therapy   Manual Therapy Passive ROM   Manual therapy comments In sitting PROM for Extension                     PT Long Term Goals - 05/14/15 1259    PT LONG TERM GOAL #1   Title I with HEP   Time 4   Period Weeks   Status On-going   PT LONG TERM GOAL #2   Title Able to ambulate with left  hip pain not > 2/10.   Time 4   Period Weeks   Status On-going  3-4/10 pain 05/14/15   PT LONG TERM GOAL #3   Title Achieve full left knee extension in order to normalize gait pattern.   Time 4   Period Weeks   Status On-going  AROM -15 degrees 05/14/15   PT LONG TERM GOAL #4   Title Perform ADL's  with pain not > 2/10.   Time 6   Period Weeks   Status On-going  3-4/10 05/14/15               Plan - 05/29/15 1136    Clinical Impression Statement Pt did great today with Rx and feels that he is doing some better. He was having some muscle spasms and some tightening  around his hip flexor yesterday, and started doing some stretches to help. I also discussed STW and scar mobilization for home. He did better with sit to stand today with less deviation.   Rehab Potential Good   PT Frequency 2x / week   PT Duration 4 weeks   PT Treatment/Interventions Contrast Bath   PT Next Visit Plan Continue focusing on equal WB, SLS activities on L, TKE and strengthening.   PT Home Exercise Plan TKE and seated HS stretch   Consulted and Agree with Plan of Care Patient      Patient will benefit from skilled therapeutic intervention in order to improve the following deficits and impairments:  Abnormal gait, Pain, Decreased activity tolerance, Decreased range of motion  Visit Diagnosis: Stiffness of left knee, not elsewhere classified  Pain in left hip  Left hip  pain     Problem List Patient Active Problem List   Diagnosis Date Noted  . OA (osteoarthritis) of hip 04/09/2015  . Avascular necrosis of bone of left hip (HCC) 03/06/2015  . Hypogonadism in male 09/12/2014  . Erectile dysfunction 06/25/2012  . Gout 06/25/2012    Luke Davila,CHRIS, PTA 05/29/2015, 1:34 PM  [facility] [address] [place], [place], [address] Phone: [phone]   Fax:  [phone]  Name: Luke Davila MRN: [id number] Date of Birth: 1965-02-14

## 2015-05-31 ENCOUNTER — Encounter: Payer: 59 | Admitting: *Deleted

## 2015-06-01 ENCOUNTER — Ambulatory Visit (INDEPENDENT_AMBULATORY_CARE_PROVIDER_SITE_OTHER): Payer: 59 | Admitting: *Deleted

## 2015-06-01 ENCOUNTER — Encounter: Payer: Self-pay | Admitting: Physical Therapy

## 2015-06-01 ENCOUNTER — Ambulatory Visit: Payer: 59 | Admitting: Physical Therapy

## 2015-06-01 DIAGNOSIS — E291 Testicular hypofunction: Secondary | ICD-10-CM | POA: Diagnosis not present

## 2015-06-01 DIAGNOSIS — M25552 Pain in left hip: Secondary | ICD-10-CM | POA: Diagnosis not present

## 2015-06-01 DIAGNOSIS — E349 Endocrine disorder, unspecified: Secondary | ICD-10-CM

## 2015-06-01 DIAGNOSIS — M25662 Stiffness of left knee, not elsewhere classified: Secondary | ICD-10-CM

## 2015-06-01 NOTE — Therapy (Signed)
[facility] [address] [place], [place], [address] Phone: [phone]   Fax:  [phone]  Physical Therapy Treatment  Patient Details  Name: Luke Davila MRN: [id number] Date of Birth: [birth year] Referring Provider: Dr. Despina HickAlusio  Encounter Date: 06/01/2015      PT End of Session - 06/01/15 1121    Visit Number 11   Number of Visits 12   Date for PT Re-Evaluation 05/24/15   PT Start Time 1116   PT Stop Time 1205   PT Time Calculation (min) 49 min   Activity Tolerance Patient tolerated treatment well   Behavior During Therapy [facility] for tasks assessed/performed      Past Medical History  Diagnosis Date  . Gout   . Avascular necrosis of hip (HCC)     LEFT  . Enlarged prostate     Past Surgical History  Procedure Laterality Date  . Foot surgery Left 1985  . Left hand surgery    . Total hip arthroplasty Left 04/09/2015    Procedure: LEFT TOTAL HIP ARTHROPLASTY ANTERIOR APPROACH;  Surgeon: Ollen GrossFrank Aluisio, MD;  Location: [facility];  Service: Orthopedics;  Laterality: Left;    There were no vitals filed for this visit.      Subjective Assessment - 06/01/15 1120    Subjective Reports that he has stiffness after sitting for a while and feels like he is wearing on his R side as he hasn't slept on his L side yet.   Limitations Walking   How long can you walk comfortably? 30 min   Currently in Pain? No/denies            [facility] - 06/01/15 0001    Assessment   Medical Diagnosis Left total hip replacement (anterior).   Onset Date/Surgical Date 04/09/15   Next MD Visit 06/2015   Restrictions   Weight Bearing Restrictions No   ROM / Strength   AROM / PROM / Strength AROM   AROM   Overall AROM  Deficits   AROM Assessment Site Knee   Right/Left Hip Left   Right/Left Knee Left   Left Knee Extension -9                     [facility] Adult PT Treatment/Exercise - 06/01/15 0001    Knee/Hip Exercises: Aerobic    Nustep L6 x15 min LEs only   Knee/Hip Exercises: Standing   Terminal Knee Extension Limitations 3x10 reps with 3 sec hold Pink XTS LLE   Lateral Step Up Left;3 sets;10 reps;Hand Hold: 2;Step Height: 8"   Forward Step Up Left;3 sets;10 reps;Hand Hold: 2;Step Height: 8"   Step Down Left;3 sets;10 reps;Hand Hold: 2   Functional Squat 2 sets;10 reps   Rocker Board 3 minutes   Knee/Hip Exercises: Seated   Long Arc Quad Strengthening;Left;3 sets;10 reps;Weights   Long Arc Quad Weight 4 lbs.   Knee/Hip Exercises: Supine   Straight Leg Raises Strengthening;Left;3 sets;10 reps   Knee/Hip Exercises: Sidelying   Hip ABduction Strengthening;Left;3 sets;10 reps             Balance Exercises - 06/01/15 1202    Balance Exercises: Standing   SLS Eyes open;Solid surface;2 reps  2x fatigue L SLS; 2 HHA- 1 HHA- 2 fingertip assist   Other Standing Exercises LLE SLS star balance x10 reps                 PT Long Term Goals - 06/01/15 1132  PT LONG TERM GOAL #1   Title I with HEP   Time 4   Period Weeks   Status Achieved   PT LONG TERM GOAL #2   Title Able to ambulate with left  hip pain not > 2/10.   Time 4   Period Weeks   Status Achieved   PT LONG TERM GOAL #3   Title Achieve full left knee extension in order to normalize gait pattern.   Time 4   Period Weeks   Status On-going  AROM L knee extension -9 deg from neutral as of 06/01/2015   PT LONG TERM GOAL #4   Title Perform ADL's with pain not > 2/10.   Time 6   Period Weeks   Status Achieved               Plan - 06/01/15 1205    Clinical Impression Statement Patient tolerated today's treatment well with no reports of pain or discomfort only "feeling it" in L hip especially with LAQ and balance activities. Patient tolerates therapeutic exercises within protocol well and able to complete following minimal to moderate multimodal cueing for proper exercise technique. Patient continues to ambulate with visible L  knee flexion in stance phase of ambulation. Improved technique and strength noted with TKE with Pink XTS today. Patient continues to experience L quad weakness and fatigue with LAQ 4#. Patient tolerated LLE SLS actvities well and was challenged most with 2 fingertip assist in parallel bars.  AROM L knee extension has also improved to -9 deg from neutral. Patient has now achieved all LT goals set at evaluation except for L knee extension goal. Patient denied L hip pain following today's treatment only "feeling it" in L hip.    Rehab Potential Good   PT Frequency 2x / week   PT Duration 4 weeks   PT Next Visit Plan Continue focusing on equal WB, SLS activities on L, TKE and strengthening.   PT Home Exercise Plan TKE and seated HS stretch   Consulted and Agree with Plan of Care Patient      Patient will benefit from skilled therapeutic intervention in order to improve the following deficits and impairments:  Abnormal gait, Pain, Decreased activity tolerance, Decreased range of motion  Visit Diagnosis: Stiffness of left knee, not elsewhere classified  Pain in left hip     Problem List Patient Active Problem List   Diagnosis Date Noted  . OA (osteoarthritis) of hip 04/09/2015  . Avascular necrosis of bone of left hip (HCC) 03/06/2015  . Hypogonadism in male 09/12/2014  . Erectile dysfunction 06/25/2012  . Gout 06/25/2012    Evelene Croon, PTA 06/01/2015, 12:13 PM  [facility] [address] [place], [place], [address] Phone: [phone]   Fax:  [phone]  Name: Luke Davila MRN: [id number] Date of Birth: [birth year]

## 2015-06-01 NOTE — Progress Notes (Signed)
Testosterone injection given in right upper outer quadrant and patient tolerated well.

## 2015-06-01 NOTE — Patient Instructions (Signed)

## 2015-06-05 ENCOUNTER — Ambulatory Visit: Payer: 59 | Admitting: Physical Therapy

## 2015-06-05 DIAGNOSIS — M25552 Pain in left hip: Secondary | ICD-10-CM

## 2015-06-05 DIAGNOSIS — M25662 Stiffness of left knee, not elsewhere classified: Secondary | ICD-10-CM

## 2015-06-05 NOTE — Therapy (Signed)
[facility] [address] [place], [place], [address] Phone: [phone]   Fax:  [phone]  Physical Therapy Treatment  Patient Details  Name: Luke Davila MRN: [id number] Date of Birth: 1965/02/27 Referring Provider: Dr. Despina Hick  Encounter Date: 06/05/2015      PT End of Session - 06/05/15 1117    Visit Number 12   Number of Visits 20   Date for PT Re-Evaluation 07/03/15   PT Start Time 1115   PT Stop Time 1202   PT Time Calculation (min) 47 min   Activity Tolerance Patient tolerated treatment well;Patient limited by pain   Behavior During Therapy [facility] for tasks assessed/performed      Past Medical History  Diagnosis Date  . Gout   . Avascular necrosis of hip (HCC)     LEFT  . Enlarged prostate     Past Surgical History  Procedure Laterality Date  . Foot surgery Left 1985  . Left hand surgery    . Total hip arthroplasty Left 04/09/2015    Procedure: LEFT TOTAL HIP ARTHROPLASTY ANTERIOR APPROACH;  Surgeon: Ollen Gross, MD;  Location: [facility];  Service: Orthopedics;  Laterality: Left;    There were no vitals filed for this visit.      Subjective Assessment - 06/05/15 1119    Subjective Patient reports that he is having a cramping feeling in the left hip flexor region. He is unable to sleep on his R side anymore and had to sleep in his recliner the past two nights.    Limitations Walking   How long can you walk comfortably? 30 min   Currently in Pain? Yes   Pain Score 5    Pain Location Hip   Pain Orientation Left   Pain Descriptors / Indicators Sore   Pain Type Surgical pain   Pain Onset More than a month ago   Pain Frequency Intermittent   Aggravating Factors  lying on hip or sittiing in truck   Pain Relieving Factors recliner            [facility] PT Assessment - 06/05/15 0001    Assessment   Medical Diagnosis Left total hip replacement (anterior).   Strength   Overall Strength Comments B hip ext 4-/5, L hip  flex/ABD 5/5; L knee flex/ext 5/5.   Flexibility   Soft Tissue Assessment /Muscle Length --  Marked tightness of L hip flexor, B quads, B HS   Palpation   Palpation comment marked tenderness of L psoas and TFL.                     [facility] Adult PT Treatment/Exercise - 06/05/15 0001    Knee/Hip Exercises: Stretches   Active Hamstring Stretch Left;2 reps;60 seconds  using strap   Quad Stretch 2 reps;30 seconds  B with strap   Hip Flexor Stretch 1 rep   Hip Flexor Stretch Limitations POE with towel roll under left thigh x 10 min with variations including bending knee and rolling toward left side.   Other Knee/Hip Stretches Also HF stretch in supine with leg supported on table; too painful at this time.   Knee/Hip Exercises: Aerobic   Nustep L6 x15 min LEs only   Manual Therapy   Manual Therapy Soft tissue mobilization;Myofascial release   Manual therapy comments deep MFR and STW using elbow to L psoas and TFL with passive hip flexion and without  PT Education - 06/05/15 1208    Education provided Yes   Education Details HEP: prone on elbows hip flexor stretch with towel under L knee.   Person(s) Educated Patient   Methods Explanation;Demonstration;Handout   Comprehension Verbalized understanding;Returned demonstration             PT Long Term Goals - 06/05/15 1220    PT LONG TERM GOAL #1   Title I with HEP   Time 4   PT LONG TERM GOAL #2   Title Able to ambulate with left  hip pain not > 2/10.   Time 4   Period Weeks   Status Achieved   PT LONG TERM GOAL #3   Title Achieve full left knee extension in order to normalize gait pattern.   Time 4   Period Weeks   Status On-going   PT LONG TERM GOAL #4   Title Perform ADL's with pain not > 2/10.   Time 6   Period Weeks   Status On-going   PT LONG TERM GOAL #5   Title Patient able to sleep without waking due to pain.   Time 4   Period Weeks   Status New               Plan  - 06/05/15 1123    Clinical Impression Statement Patient presented today with increased tightness in L hip flexor affecting sleep and gait. He responded well to deep HF release and low load long duration stretching reporting ease with sit to walk transition and gait afterwards. Patient continues to have decreased L knee extension and weakness in B hip extension.   Rehab Potential Good   PT Frequency 2x / week   PT Duration 4 weeks   PT Treatment/Interventions ADLs/Self Care Home Management;Cryotherapy;Electrical Stimulation;Moist Heat;Therapeutic exercise;Therapeutic activities;Manual techniques;Gait training;Patient/family education;Neuromuscular re-education;Passive range of motion   PT Next Visit Plan Continue focusing on releasing L hip flexor, equal WB, SLS activities on L, TKE and strengthening.   PT Home Exercise Plan POE stretch for hip flexors.   Consulted and Agree with Plan of Care Patient      Patient will benefit from skilled therapeutic intervention in order to improve the following deficits and impairments:  Abnormal gait, Pain, Decreased activity tolerance, Decreased range of motion, Impaired flexibility, Decreased strength  Visit Diagnosis: Pain in left hip - Plan: PT plan of care cert/re-cert  Stiffness of left knee, not elsewhere classified - Plan: PT plan of care cert/re-cert     Problem List Patient Active Problem List   Diagnosis Date Noted  . OA (osteoarthritis) of hip 04/09/2015  . Avascular necrosis of bone of left hip (HCC) 03/06/2015  . Hypogonadism in male 09/12/2014  . Erectile dysfunction 06/25/2012  . Gout 06/25/2012    Solon PalmJulie Derry Kassel PT  06/05/2015, 12:33 PM  [facility] [address] [place], [place], [address] Phone: [phone]   Fax:  [phone]  Name: Loleta RoseSamuel Vaughn MRN: [id number] Date of Birth: March 27, 1965

## 2015-06-07 ENCOUNTER — Ambulatory Visit: Payer: 59

## 2015-06-07 ENCOUNTER — Encounter: Payer: 59 | Admitting: Physical Therapy

## 2015-06-11 ENCOUNTER — Ambulatory Visit: Payer: 59 | Attending: Orthopedic Surgery | Admitting: *Deleted

## 2015-06-11 ENCOUNTER — Encounter: Payer: 59 | Admitting: Physician Assistant

## 2015-06-11 DIAGNOSIS — M25552 Pain in left hip: Secondary | ICD-10-CM

## 2015-06-11 DIAGNOSIS — M25662 Stiffness of left knee, not elsewhere classified: Secondary | ICD-10-CM | POA: Diagnosis present

## 2015-06-11 NOTE — Therapy (Signed)
[facility] [address] [place], [place], [address] Phone: [phone]   Fax:  [phone]  Physical Therapy Treatment  Patient Details  Name: Luke Davila MRN: [id number] Date of Birth: 02/26/1965 Referring Provider: Dr. Despina Hick  Encounter Date: 06/11/2015      PT End of Session - 06/11/15 1345    Visit Number 13   Number of Visits 20   Date for PT Re-Evaluation 07/03/15   PT Start Time 1115   PT Stop Time 1205   PT Time Calculation (min) 50 min   Activity Tolerance Patient tolerated treatment well;Patient limited by pain   Behavior During Therapy [facility] for tasks assessed/performed      Past Medical History  Diagnosis Date  . Gout   . Avascular necrosis of hip (HCC)     LEFT  . Enlarged prostate     Past Surgical History  Procedure Laterality Date  . Foot surgery Left 1985  . Left hand surgery    . Total hip arthroplasty Left 04/09/2015    Procedure: LEFT TOTAL HIP ARTHROPLASTY ANTERIOR APPROACH;  Surgeon: Ollen Gross, MD;  Location: [facility];  Service: Orthopedics;  Laterality: Left;    There were no vitals filed for this visit.      Subjective Assessment - 06/11/15 1133    Subjective Patient reports that he is having a cramping feeling in the left hip flexor region. He is unable to sleep on his R side anymore and had to sleep in his recliner the past two nights.     The massage helped last time.    Limitations Walking   How long can you walk comfortably? 30 min   Currently in Pain? Yes   Pain Score 5    Pain Location Hip   Pain Orientation Left   Pain Descriptors / Indicators Sore   Pain Type Surgical pain   Pain Onset More than a month ago   Pain Frequency Intermittent                         [facility] Adult PT Treatment/Exercise - 06/11/15 0001    Knee/Hip Exercises: Aerobic   Nustep L7 x20 min LEs only   Manual Therapy   Manual Therapy Soft tissue mobilization;Myofascial release   Manual  therapy comments deep MFR/ IASTM  and STW using elbow to L psoas and TFL with passive hip flexion and without                     PT Long Term Goals - 06/05/15 1220    PT LONG TERM GOAL #1   Title I with HEP   Time 4   PT LONG TERM GOAL #2   Title Able to ambulate with left  hip pain not > 2/10.   Time 4   Period Weeks   Status Achieved   PT LONG TERM GOAL #3   Title Achieve full left knee extension in order to normalize gait pattern.   Time 4   Period Weeks   Status On-going   PT LONG TERM GOAL #4   Title Perform ADL's with pain not > 2/10.   Time 6   Period Weeks   Status On-going   PT LONG TERM GOAL #5   Title Patient able to sleep without waking due to pain.   Time 4   Period Weeks   Status New  Plan - 06/11/15 1349    Clinical Impression Statement pt did fairly well with Therex for LT hip. Pt had notable tightness and soreness around incision and LT hip flexor. He had  agood releases with TPR techniques and felt better after RX.     Rehab Potential Good   PT Frequency 2x / week   PT Duration 4 weeks   PT Treatment/Interventions ADLs/Self Care Home Management;Cryotherapy;Electrical Stimulation;Moist Heat;Therapeutic exercise;Therapeutic activities;Manual techniques;Gait training;Patient/family education;Neuromuscular re-education;Passive range of motion   PT Next Visit Plan Continue focusing on releasing L hip flexor, equal WB, SLS activities on L, TKE and strengthening.   PT Home Exercise Plan POE stretch for hip flexors.   Consulted and Agree with Plan of Care Patient      Patient will benefit from skilled therapeutic intervention in order to improve the following deficits and impairments:  Abnormal gait, Pain, Decreased activity tolerance, Decreased range of motion, Impaired flexibility, Decreased strength  Visit Diagnosis: Pain in left hip  Stiffness of left knee, not elsewhere classified     Problem List Patient Active  Problem List   Diagnosis Date Noted  . OA (osteoarthritis) of hip 04/09/2015  . Avascular necrosis of bone of left hip (HCC) 03/06/2015  . Hypogonadism in male 09/12/2014  . Erectile dysfunction 06/25/2012  . Gout 06/25/2012    Na Waldrip,CHRIS, PTA 06/11/2015, 1:57 PM  [facility] [address] [place], [place], [address] Phone: [phone]   Fax:  [phone]  Name: Loleta RoseSamuel Knoll MRN: [id number] Date of Birth: 02-05-1965

## 2015-06-12 ENCOUNTER — Ambulatory Visit (INDEPENDENT_AMBULATORY_CARE_PROVIDER_SITE_OTHER): Payer: 59 | Admitting: Family Medicine

## 2015-06-12 ENCOUNTER — Encounter: Payer: Self-pay | Admitting: Family Medicine

## 2015-06-12 ENCOUNTER — Ambulatory Visit: Payer: 59 | Admitting: Physical Therapy

## 2015-06-12 VITALS — BP 131/82 | HR 102 | Temp 97.8°F | Ht 75.0 in | Wt 265.8 lb

## 2015-06-12 DIAGNOSIS — M25662 Stiffness of left knee, not elsewhere classified: Secondary | ICD-10-CM

## 2015-06-12 DIAGNOSIS — E291 Testicular hypofunction: Secondary | ICD-10-CM | POA: Diagnosis not present

## 2015-06-12 DIAGNOSIS — M87052 Idiopathic aseptic necrosis of left femur: Secondary | ICD-10-CM

## 2015-06-12 DIAGNOSIS — R600 Localized edema: Secondary | ICD-10-CM

## 2015-06-12 DIAGNOSIS — M25552 Pain in left hip: Secondary | ICD-10-CM | POA: Diagnosis not present

## 2015-06-12 DIAGNOSIS — Z0000 Encounter for general adult medical examination without abnormal findings: Secondary | ICD-10-CM

## 2015-06-12 DIAGNOSIS — N528 Other male erectile dysfunction: Secondary | ICD-10-CM

## 2015-06-12 DIAGNOSIS — IMO0001 Reserved for inherently not codable concepts without codable children: Secondary | ICD-10-CM

## 2015-06-12 LAB — URINALYSIS
Bilirubin, UA: NEGATIVE
Glucose, UA: NEGATIVE
Ketones, UA: NEGATIVE
LEUKOCYTES UA: NEGATIVE
Nitrite, UA: NEGATIVE
PH UA: 5.5 (ref 5.0–7.5)
PROTEIN UA: NEGATIVE
RBC UA: NEGATIVE
SPEC GRAV UA: 1.015 (ref 1.005–1.030)
Urobilinogen, Ur: 0.2 mg/dL (ref 0.2–1.0)

## 2015-06-12 MED ORDER — DICLOFENAC SODIUM 75 MG PO TBEC: 75.0000 mg | DELAYED_RELEASE_TABLET | Freq: Two times a day (BID) | ORAL | Status: DC

## 2015-06-12 MED ORDER — TRAMADOL HCL 50 MG PO TABS: 50.0000 mg | ORAL_TABLET | Freq: Four times a day (QID) | ORAL | Status: DC | PRN

## 2015-06-12 MED ORDER — TAMSULOSIN HCL 0.4 MG PO CAPS: 0.8000 mg | ORAL_CAPSULE | Freq: Every day | ORAL | Status: DC

## 2015-06-12 NOTE — Therapy (Signed)
[facility] [address] [place], [place], [address] Phone: [phone]   Fax:  [phone]  Physical Therapy Treatment  Patient Details  Name: Luke Davila MRN: [id number] Date of Birth: 11/01/1965 Referring Provider: Dr. Despina HickAlusio  Encounter Date: 06/12/2015      PT End of Session - 06/12/15 1120    Visit Number 14   Number of Visits 20   Date for PT Re-Evaluation 07/03/15   PT Start Time 1119   PT Stop Time 1203   PT Time Calculation (min) 44 min   Activity Tolerance Patient tolerated treatment well   Behavior [facility] Therapy [facility] for tasks assessed/performed      Past Medical History  Diagnosis Date  . Gout   . Avascular necrosis of hip (HCC)     LEFT  . Enlarged prostate     Past Surgical History  Procedure Laterality Date  . Foot surgery Left 1985  . Left hand surgery    . Total hip arthroplasty Left 04/09/2015    Procedure: LEFT TOTAL HIP ARTHROPLASTY ANTERIOR APPROACH;  Surgeon: Ollen GrossFrank Aluisio, MD;  Location: [facility];  Service: Orthopedics;  Laterality: Left;    There were no vitals filed for this visit.      Subjective Assessment - 06/12/15 1123    Subjective Patient reports he still is just having pain mainly at night and in the morning. He has been afraid to lie on L hip, but PT assured him it was okay.    Currently in Pain? Yes   Pain Score 6   at night   Pain Location Hip   Pain Orientation Left   Pain Descriptors / Indicators Sore   Pain Type Surgical pain   Pain Onset More than a month ago   Pain Frequency Intermittent   Aggravating Factors  at night, prolonged sitting   Pain Relieving Factors hip flexor release                         [facility] Adult PT Treatment/Exercise - 06/12/15 0001    Knee/Hip Exercises: Stretches   Hip Flexor Stretch 1 rep  x 2 min with foot supported on bolster; abs contracted   Knee/Hip Exercises: Aerobic   Nustep L 6 x 11 min; L 7 x 5   Manual Therapy    Manual Therapy Soft tissue mobilization;Myofascial release   Manual therapy comments deep MFR to L psoas with elbow, TFL and piriformis                     PT Long Term Goals - 06/12/15 1132    PT LONG TERM GOAL #1   Title I with HEP   Time 4   Period Weeks   Status Achieved   PT LONG TERM GOAL #2   Title Able to ambulate with left  hip pain not > 2/10.   Time 4   Period Weeks   Status Achieved   PT LONG TERM GOAL #3   Title Achieve full left knee extension in order to normalize gait pattern.   Time 4   Period Weeks   Status On-going   PT LONG TERM GOAL #4   Title Perform ADL's with pain not > 2/10.   Time 6   Period Weeks   Status On-going   PT LONG TERM GOAL #5   Title Patient able to sleep without waking due to pain.   Time 4   Status On-going  Plan - 06/12/15 1205    Clinical Impression Statement Patient tolerated deep MFR well although it is painful. His psoas releases quite easiy with deep MFR, however TFL more difficult. Patient reports compliance with home stretches and using tennis ball for release. Goals are ongoing.   Rehab Potential Good   PT Frequency 2x / week   PT Duration 4 weeks   PT Treatment/Interventions ADLs/Self Care Home Management;Cryotherapy;Electrical Stimulation;Moist Heat;Therapeutic exercise;Therapeutic activities;Manual techniques;Gait training;Patient/family education;Neuromuscular re-education;Passive range of motion   PT Next Visit Plan Continue focusing on releasing L hip flexor, TFL and piriformis. Also, equal WB, SLS activities on L, TKE and strengthening.      Patient will benefit from skilled therapeutic intervention in order to improve the following deficits and impairments:  Abnormal gait, Pain, Decreased activity tolerance, Decreased range of motion, Impaired flexibility, Decreased strength  Visit Diagnosis: Pain in left hip  Stiffness of left knee, not elsewhere classified     Problem  List Patient Active Problem List   Diagnosis Date Noted  . OA (osteoarthritis) of hip 04/09/2015  . Avascular necrosis of bone of left hip (HCC) 03/06/2015  . Hypogonadism in male 09/12/2014  . Erectile dysfunction 06/25/2012  . Gout 06/25/2012    Solon Palm PT  06/12/2015, 12:10 PM  [facility] [address] [place], [place], [address] Phone: [phone]   Fax:  [phone]  Name: Luke Davila MRN: [id number] Date of Birth: 1965-08-25

## 2015-06-12 NOTE — Progress Notes (Signed)
Subjective:  Patient ID: Luke Davila, male    DOB: 1965/01/24  Age: 50 y.o. MRN: [id number]  CC: Annual Exam   HPI Luke Davila presents for  Pt. Just resumed testosterone. Had second shot this AM. Had DCed in January.Having erectile dysfunction. Concerned that Shots haven;t resolved this problem.   Still having rehab on left hip. Surgey for avascular necrosis performed 6-8 weeks ago. Now getting outpt. PT. Pt. Satisfied with his progress. However, he continues to have 6/10 pain, but has no meds for it. Had used oxycodone and tramadol post op.   Pt. also is concerned that he has to urinate multiple time overnight in spite of use of tamsulosin one a day.    History Luke Davila has a past medical history of Gout; Avascular necrosis of hip ([place]); and Enlarged prostate.   He has past surgical history that includes Foot surgery (Left, 1985); Left hand surgery; and Total hip arthroplasty (Left, 04/09/2015).   His family history includes Hypertension in his mother.He reports that he has never smoked. He has never used smokeless tobacco. He reports that he drinks alcohol. He reports that he does not use illicit drugs.    Medication List       This list is accurate as of: 06/12/15  8:59 PM.  Always use your most recent med list.               allopurinol 100 MG tablet  Commonly known as:  ZYLOPRIM  TAKE 1 TABLET (100 MG TOTAL) BY MOUTH DAILY.     diclofenac 75 MG EC tablet  Commonly known as:  VOLTAREN  Take 1 tablet (75 mg total) by mouth 2 (two) times daily. For muscle and  Joint pain     methocarbamol 500 MG tablet  Commonly known as:  ROBAXIN  Take 1 tablet (500 mg total) by mouth every 6 (six) hours as needed for muscle spasms.     tamsulosin 0.4 MG Caps capsule  Commonly known as:  FLOMAX  Take 2 capsules (0.8 mg total) by mouth daily after supper.     traMADol 50 MG tablet  Commonly known as:  ULTRAM  Take 1-2 tablets (50-100 mg total) by mouth every 6 (six) hours as needed  (mild pain).     VISINE OP  Apply 1 drop to eye 2 (two) times daily.         ROS Review of Systems  Constitutional: Negative for fever, chills, diaphoresis, activity change, appetite change, fatigue and unexpected weight change.  HENT: Negative for congestion, ear pain, hearing loss, postnasal drip, rhinorrhea, sore throat, tinnitus and trouble swallowing.   Eyes: Negative for photophobia, pain, discharge and redness.  Respiratory: Negative for apnea, cough, choking, chest tightness, shortness of breath, wheezing and stridor.   Cardiovascular: Positive for leg swelling. Negative for chest pain and palpitations.  Gastrointestinal: Negative for nausea, vomiting, abdominal pain, diarrhea, constipation, blood in stool and abdominal distention.  Endocrine: Negative for cold intolerance, heat intolerance, polydipsia, polyphagia and polyuria.  Genitourinary: Positive for difficulty urinating. Negative for dysuria, urgency, frequency, hematuria, flank pain, enuresis and genital sores.  Musculoskeletal: Positive for joint swelling and arthralgias. Negative for neck pain.  Skin: Negative for color change, rash and wound.  Allergic/Immunologic: Negative for immunocompromised state.  Neurological: Negative for dizziness, tremors, seizures, syncope, facial asymmetry, speech difficulty, weakness, light-headedness, numbness and headaches.  Hematological: Does not bruise/bleed easily.  Psychiatric/Behavioral: Negative for suicidal ideas, hallucinations, behavioral problems, confusion, sleep disturbance, dysphoric mood, decreased concentration  and agitation. The patient is not nervous/anxious and is not hyperactive.     Objective:  BP 131/82 mmHg  Pulse 102  Temp(Src) 97.8 F (36.6 C) (Oral)  Ht _0  (1.905 m)  Wt 265 lb 12.8 oz (120.566 kg)  BMI 33.22 kg/m2  SpO2 99%  BP Readings from Last 3 Encounters:  06/12/15 131/82  04/11/15 139/86  04/03/15 135/67    Wt Readings from Last 3  Encounters:  06/12/15 265 lb 12.8 oz (120.566 kg)  04/09/15 263 lb (119.296 kg)  04/03/15 263 lb (119.296 kg)     Physical Exam  Constitutional: He is oriented to person, place, and time. He appears well-developed and well-nourished.  HENT:  Head: Normocephalic and atraumatic.  Mouth/Throat: Oropharynx is clear and moist.  Eyes: EOM are normal. Pupils are equal, round, and reactive to light.  Neck: Normal range of motion. No tracheal deviation present. No thyromegaly present.  Cardiovascular: Normal rate, regular rhythm and normal heart sounds.  Exam reveals no gallop and no friction rub.   No murmur heard. Pulmonary/Chest: Breath sounds normal. He has no wheezes. He has no rales.  Abdominal: Soft. He exhibits no mass. There is no tenderness.  Musculoskeletal: Normal range of motion. He exhibits no edema.  Neurological: He is alert and oriented to person, place, and time.  Skin: Skin is warm and dry.  Psychiatric: He has a normal mood and affect.     Lab Results  Component Value Date   WBC 11.8* 04/11/2015   HGB 10.7* 04/11/2015   HCT 32.5* 04/11/2015   PLT 191 04/11/2015   GLUCOSE 142* 04/11/2015   CHOL 243* 04/18/2013   TRIG 126 04/18/2013   HDL 58 04/18/2013   LDLCALC 160* 04/18/2013   ALT 30 03/19/2015   AST 39 03/19/2015   NA 143 04/11/2015   K 3.8 04/11/2015   CL 109 04/11/2015   CREATININE 1.07 04/11/2015   BUN 10 04/11/2015   CO2 27 04/11/2015   TSH 1.880 02/06/2015   PSA 0.4 09/15/2013   INR 1.10 04/03/2015   HGBA1C 5.5 06/20/2014    No results found.  Assessment & Plan:   Luke Davila was seen today for annual exam.  Diagnoses and all orders for this visit:  Well adult -     CBC with Differential/Platelet -     CMP14+EGFR -     Lipid panel -     Urinalysis -     VITAMIN D 25 Hydroxy (Vit-D Deficiency, Fractures) -     Hepatitis c antibody (reflex) -     PSA Total (Reflex To Free)  Hypogonadism in male -     TSH -     Testosterone,Free and  Total -     PSA Total (Reflex To Free)  Avascular necrosis of bone of left hip (HCC)  Other male erectile dysfunction -     TSH -     Testosterone,Free and Total -     PSA Total (Reflex To Free)  Localized edema -     TSH -     Urinalysis  Other orders -     diclofenac (VOLTAREN) 75 MG EC tablet; Take 1 tablet (75 mg total) by mouth 2 (two) times daily. For muscle and  Joint pain -     traMADol (ULTRAM) 50 MG tablet; Take 1-2 tablets (50-100 mg total) by mouth every 6 (six) hours as needed (mild pain). -     tamsulosin (FLOMAX) 0.4 MG CAPS capsule; Take 2 capsules (  0.8 mg total) by mouth daily after supper.    I have discontinued Mr. Sontag oxyCODONE and rivaroxaban. I have also changed his tamsulosin. Additionally, I am having him start on diclofenac. Lastly, I am having him maintain his allopurinol, Tetrahydrozoline HCl (VISINE OP), methocarbamol, and traMADol. We administered testosterone cypionate. We will continue to administer testosterone cypionate.  Meds ordered this encounter  Medications  . diclofenac (VOLTAREN) 75 MG EC tablet    Sig: Take 1 tablet (75 mg total) by mouth 2 (two) times daily. For muscle and  Joint pain    Dispense:  60 tablet    Refill:  2  . traMADol (ULTRAM) 50 MG tablet    Sig: Take 1-2 tablets (50-100 mg total) by mouth every 6 (six) hours as needed (mild pain).    Dispense:  80 tablet    Refill:  1  . tamsulosin (FLOMAX) 0.4 MG CAPS capsule    Sig: Take 2 capsules (0.8 mg total) by mouth daily after supper.    Dispense:  30 capsule    Refill:  5    For prostate and urine flow     Follow-up: Return in about 2 months (around 08/12/2015) for hypogonadism.  Claretta Fraise, M.D.

## 2015-06-13 ENCOUNTER — Other Ambulatory Visit: Payer: Self-pay | Admitting: Family Medicine

## 2015-06-13 LAB — CBC WITH DIFFERENTIAL/PLATELET
BASOS: 1 %
Basophils Absolute: 0 10*3/uL (ref 0.0–0.2)
EOS (ABSOLUTE): 0.2 10*3/uL (ref 0.0–0.4)
EOS: 4 %
HEMATOCRIT: 39.9 % (ref 37.5–51.0)
Hemoglobin: 13.1 g/dL (ref 12.6–17.7)
IMMATURE GRANS (ABS): 0 10*3/uL (ref 0.0–0.1)
IMMATURE GRANULOCYTES: 0 %
LYMPHS: 26 %
Lymphocytes Absolute: 0.9 10*3/uL (ref 0.7–3.1)
MCH: 25.3 pg — ABNORMAL LOW (ref 26.6–33.0)
MCHC: 32.8 g/dL (ref 31.5–35.7)
MCV: 77 fL — AB (ref 79–97)
MONOS ABS: 0.5 10*3/uL (ref 0.1–0.9)
Monocytes: 13 %
NEUTROS PCT: 56 %
Neutrophils Absolute: 2 10*3/uL (ref 1.4–7.0)
Platelets: 227 10*3/uL (ref 150–379)
RBC: 5.18 x10E6/uL (ref 4.14–5.80)
RDW: 14 % (ref 12.3–15.4)
WBC: 3.6 10*3/uL (ref 3.4–10.8)

## 2015-06-13 LAB — CMP14+EGFR
A/G RATIO: 1.5 (ref 1.2–2.2)
ALT: 17 IU/L (ref 0–44)
AST: 30 IU/L (ref 0–40)
Albumin: 4.3 g/dL (ref 3.5–5.5)
Alkaline Phosphatase: 158 IU/L — ABNORMAL HIGH (ref 39–117)
BUN/Creatinine Ratio: 6 — ABNORMAL LOW (ref 9–20)
BUN: 7 mg/dL (ref 6–24)
Bilirubin Total: 0.5 mg/dL (ref 0.0–1.2)
CALCIUM: 9.1 mg/dL (ref 8.7–10.2)
CO2: 25 mmol/L (ref 18–29)
Chloride: 99 mmol/L (ref 96–106)
Creatinine, Ser: 1.15 mg/dL (ref 0.76–1.27)
GFR, EST AFRICAN AMERICAN: 86 mL/min/{1.73_m2} (ref >59)
GFR, EST NON AFRICAN AMERICAN: 74 mL/min/{1.73_m2} (ref >59)
GLOBULIN, TOTAL: 2.8 g/dL (ref 1.5–4.5)
Glucose: 100 mg/dL — ABNORMAL HIGH (ref 65–99)
Potassium: 4.2 mmol/L (ref 3.5–5.2)
SODIUM: 140 mmol/L (ref 134–144)
TOTAL PROTEIN: 7.1 g/dL (ref 6.0–8.5)

## 2015-06-13 LAB — TESTOSTERONE,FREE AND TOTAL
TESTOSTERONE FREE: 24.7 pg/mL — AB (ref 6.8–21.5)
Testosterone: 1233 ng/dL — ABNORMAL HIGH (ref 348–1197)

## 2015-06-13 LAB — PSA TOTAL (REFLEX TO FREE): Prostate Specific Ag, Serum: 0.4 ng/mL (ref 0.0–4.0)

## 2015-06-13 LAB — HEPATITIS C ANTIBODY (REFLEX): HCV Ab: <0.1 s/co ratio (ref 0.0–0.9)

## 2015-06-13 LAB — VITAMIN D 25 HYDROXY (VIT D DEFICIENCY, FRACTURES): Vit D, 25-Hydroxy: 13 ng/mL — ABNORMAL LOW (ref 30.0–100.0)

## 2015-06-13 LAB — HCV COMMENT:

## 2015-06-13 MED ORDER — VITAMIN D (ERGOCALCIFEROL) 1.25 MG (50000 UNIT) PO CAPS: 50000.0000 [IU] | ORAL_CAPSULE | ORAL | Status: DC

## 2015-06-14 ENCOUNTER — Ambulatory Visit: Payer: 59 | Admitting: *Deleted

## 2015-06-14 DIAGNOSIS — M25662 Stiffness of left knee, not elsewhere classified: Secondary | ICD-10-CM

## 2015-06-14 DIAGNOSIS — M25552 Pain in left hip: Secondary | ICD-10-CM | POA: Diagnosis not present

## 2015-06-14 NOTE — Addendum Note (Signed)
Addended by: Caryl BisBOWMAN, Gardiner [place]span[place] M on: 06/14/2015 10:47 AM   Modules accepted: Orders, SmartSet

## 2015-06-14 NOTE — Therapy (Signed)
[facility] [address] [place], [place], [address] Phone: [phone]   Fax:  [phone]  Physical Therapy Treatment  Patient Details  Name: Luke Davila MRN: [id number] Date of Birth: 04-29-1965 Referring Provider: Dr. Despina HickAlusio  Encounter Date: 06/14/2015      PT End of Session - 06/14/15 1104    Visit Number 15   Number of Visits 20   Date for PT Re-Evaluation 07/03/15   PT Start Time 0815   PT Stop Time 0905   PT Time Calculation (min) 50 min   Activity Tolerance Patient tolerated treatment well   Behavior [facility] Therapy [facility] for tasks assessed/performed      Past Medical History  Diagnosis Date  . Gout   . Avascular necrosis of hip (HCC)     LEFT  . Enlarged prostate     Past Surgical History  Procedure Laterality Date  . Foot surgery Left 1985  . Left hand surgery    . Total hip arthroplasty Left 04/09/2015    Procedure: LEFT TOTAL HIP ARTHROPLASTY ANTERIOR APPROACH;  Surgeon: Ollen GrossFrank Aluisio, MD;  Location: [facility];  Service: Orthopedics;  Laterality: Left;    There were no vitals filed for this visit.      Subjective Assessment - 06/14/15 0819    Subjective Patient reports he still is just having pain mainly at night and in the morning. He has been afraid to lie on L hip, but PT assured him it was okay.    Limitations Walking   How long can you walk comfortably? 30 min   Currently in Pain? Yes   Pain Score 6    Pain Location Hip   Pain Orientation Left   Pain Descriptors / Indicators Sore   Pain Type Surgical pain   Pain Onset More than a month ago   Pain Frequency Intermittent   Aggravating Factors  work, night                         [facility] Adult PT Treatment/Exercise - 06/14/15 0001    Knee/Hip Exercises: Aerobic   Nustep L 6 x 13 min; L 7 x 5   Manual Therapy   Manual Therapy Soft tissue mobilization;Myofascial release   Manual therapy comments deep MFR/ IASTM  and STW using elbow to L  psoas and TFL with passive hip flexion and without   Passive ROM gentle low load stretch for left knee ext and for prone hip flexors, Prone knee flexion to 100 degrees                     PT Long Term Goals - 06/12/15 1132    PT LONG TERM GOAL #1   Title I with HEP   Time 4   Period Weeks   Status Achieved   PT LONG TERM GOAL #2   Title Able to ambulate with left  hip pain not > 2/10.   Time 4   Period Weeks   Status Achieved   PT LONG TERM GOAL #3   Title Achieve full left knee extension in order to normalize gait pattern.   Time 4   Period Weeks   Status On-going   PT LONG TERM GOAL #4   Title Perform ADL's with pain not > 2/10.   Time 6   Period Weeks   Status On-going   PT LONG TERM GOAL #5   Title Patient able to sleep without waking due  to pain.   Time 4   Status On-going               Plan - 06/14/15 1108    Clinical Impression Statement Pt was coming off a 12 hr shift from work and was complaining of how tight his LT hip is staying and was encouraged to perform stretches more often. He is still unable to lay comfortable in the prone position due to hip extension deficit.Marland Kitchen He had good releases in psoas after STW. In prone, he was only  able to flex his LT knee to 100 degees due to tightness anterior aspect.   Rehab Potential Good   PT Frequency 2x / week   PT Duration 4 weeks   PT Treatment/Interventions ADLs/Self Care Home Management;Cryotherapy;Electrical Stimulation;Moist Heat;Therapeutic exercise;Therapeutic activities;Manual techniques;Gait training;Patient/family education;Neuromuscular re-education;Passive range of motion   PT Next Visit Plan Continue focusing on releasing L hip flexor, TFL and piriformis. Also, equal WB, SLS activities on L, TKE and strengthening.   PT Home Exercise Plan POE stretch for hip flexors.   Consulted and Agree with Plan of Care Patient      Patient will benefit from skilled therapeutic intervention in order to  improve the following deficits and impairments:  Abnormal gait, Pain, Decreased activity tolerance, Decreased range of motion, Impaired flexibility, Decreased strength  Visit Diagnosis: Pain in left hip  Stiffness of left knee, not elsewhere classified     Problem List Patient Active Problem List   Diagnosis Date Noted  . OA (osteoarthritis) of hip 04/09/2015  . Avascular necrosis of bone of left hip (HCC) 03/06/2015  . Hypogonadism in male 09/12/2014  . Erectile dysfunction 06/25/2012  . Gout 06/25/2012    Toni Demo,CHRIS, PTA 06/14/2015, 11:43 AM  [facility] [address] [place], [place], [address] Phone: [phone]   Fax:  [phone]  Name: Luke Davila MRN: [id number] Date of Birth: June 10, 1965

## 2015-06-19 ENCOUNTER — Ambulatory Visit (INDEPENDENT_AMBULATORY_CARE_PROVIDER_SITE_OTHER): Payer: 59 | Admitting: *Deleted

## 2015-06-19 ENCOUNTER — Telehealth: Payer: Self-pay | Admitting: Family Medicine

## 2015-06-19 ENCOUNTER — Ambulatory Visit: Payer: 59 | Admitting: Physical Therapy

## 2015-06-19 ENCOUNTER — Ambulatory Visit: Payer: 59

## 2015-06-19 DIAGNOSIS — M25662 Stiffness of left knee, not elsewhere classified: Secondary | ICD-10-CM

## 2015-06-19 DIAGNOSIS — E291 Testicular hypofunction: Secondary | ICD-10-CM

## 2015-06-19 DIAGNOSIS — M25552 Pain in left hip: Secondary | ICD-10-CM

## 2015-06-19 NOTE — Patient Instructions (Signed)

## 2015-06-19 NOTE — Therapy (Signed)
[facility] [address] [place], [place], [address] Phone: [phone]   Fax:  [phone]  Physical Therapy Treatment  Patient Details  Name: Luke Davila MRN: [id number] Date of Birth: [birth year] Referring Provider: Dr. Despina Hick  Encounter Date: 06/19/2015      PT End of Session - 06/19/15 1126    Visit Number 16   Number of Visits 20   Date for PT Re-Evaluation 07/03/15   PT Start Time 1122   PT Stop Time 1215   PT Time Calculation (min) 53 min   Activity Tolerance Patient tolerated treatment well   Behavior During Therapy [facility] for tasks assessed/performed      Past Medical History  Diagnosis Date  . Gout   . Avascular necrosis of hip (HCC)     LEFT  . Enlarged prostate     Past Surgical History  Procedure Laterality Date  . Foot surgery Left 1985  . Left hand surgery    . Total hip arthroplasty Left 04/09/2015    Procedure: LEFT TOTAL HIP ARTHROPLASTY ANTERIOR APPROACH;  Surgeon: Ollen Gross, MD;  Location: [facility];  Service: Orthopedics;  Laterality: Left;    There were no vitals filed for this visit.      Subjective Assessment - 06/19/15 1127    Subjective Patient not hurting today. 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   Limitations Walking   How long can you walk comfortably? 30 min   Currently in Pain? No/denies                         [facility] Adult PT Treatment/Exercise - 06/19/15 0001    Knee/Hip Exercises: Stretches   Hip Flexor Stretch 3 reps;60 seconds  with left lower leg on chair behind pt   Knee/Hip Exercises: Aerobic   Nustep L6 x 4; L 5 x   decreased due to c/o aggravation to hip at L6   Knee/Hip Exercises: Standing   Lunge Walking - Round Trips exaggerated walking x 60 feet for HF stretch   Modalities   Modalities Moist Heat   Moist Heat Therapy   Number Minutes Moist Heat 10 Minutes   Moist Heat Location  Hip  L HF   Manual Therapy   Manual Therapy Soft tissue mobilization;Joint mobilization   Manual therapy comments MWM in standing for left knee exrtension (TKE) and prone post glide with passive stretch into ext    Joint Mobilization deep STW and MFR to left hip flexor in supine                     PT Long Term Goals - 06/12/15 1132    PT LONG TERM GOAL #1   Title I with HEP   Time 4   Period Weeks   Status Achieved   PT LONG TERM GOAL #2   Title Able to ambulate with left  hip pain not > 2/10.   Time 4   Period Weeks   Status Achieved   PT LONG TERM GOAL #3   Title Achieve full left knee extension in order to normalize gait pattern.   Time 4   Period Weeks   Status On-going   PT LONG TERM GOAL #4   Title Perform ADL's with pain not > 2/10.   Time 6   Period Weeks   Status On-going  PT LONG TERM GOAL #5   Title Patient able to sleep without waking due to pain.   Time 4   Status On-going               Plan - 06/19/15 1131    Clinical Impression Statement Patient continues to have left HF tightness. He is able to stretch it out fairly well until he sits for 30 min or longer. His knee extension measured -9 degrees today after stretching and STW. In prone patient is able to extend completely however hip flexor tightness is evident.    Rehab Potential Good   PT Frequency 2x / week   PT Duration 4 weeks   PT Treatment/Interventions ADLs/Self Care Home Management;Cryotherapy;Electrical Stimulation;Moist Heat;Therapeutic exercise;Therapeutic activities;Manual techniques;Gait training;Patient/family education;Neuromuscular re-education;Passive range of motion   PT Next Visit Plan MD note for MD appointment 06/26/15. Continue focusing on releasing L hip flexor, TFL and piriformis. Also, equal WB, SLS activities on L, TKE and strengthening.   PT Home Exercise Plan standing hip flexor stretch with left leg on chair.      Patient will benefit from skilled  therapeutic intervention in order to improve the following deficits and impairments:  Abnormal gait, Pain, Decreased activity tolerance, Decreased range of motion, Impaired flexibility, Decreased strength  Visit Diagnosis: Pain in left hip  Stiffness of left knee, not elsewhere classified     Problem List Patient Active Problem List   Diagnosis Date Noted  . OA (osteoarthritis) of hip 04/09/2015  . Avascular necrosis of bone of left hip (HCC) 03/06/2015  . Hypogonadism in male 09/12/2014  . Erectile dysfunction 06/25/2012  . Gout 06/25/2012    Solon PalmJulie Pamlea Finder PT 06/19/2015, 12:47 PM  [facility] [address] [place], [place], [address] Phone: [phone]   Fax:  [phone]  Name: Luke Davila MRN: [id number] Date of Birth: [birth year]

## 2015-06-19 NOTE — Progress Notes (Signed)
Pt given testosterone injection IM RUOQ and tolerated well. 

## 2015-06-21 ENCOUNTER — Encounter: Payer: 59 | Admitting: *Deleted

## 2015-06-25 ENCOUNTER — Encounter: Payer: Self-pay | Admitting: Physical Therapy

## 2015-06-25 ENCOUNTER — Ambulatory Visit: Payer: 59 | Admitting: Physical Therapy

## 2015-06-25 DIAGNOSIS — M25552 Pain in left hip: Secondary | ICD-10-CM | POA: Diagnosis not present

## 2015-06-25 DIAGNOSIS — M25662 Stiffness of left knee, not elsewhere classified: Secondary | ICD-10-CM

## 2015-06-25 NOTE — Therapy (Signed)
[facility] [place], [place], [address] Phone: [phone]   Fax:  [phone]  Physical Therapy Treatment  Patient Details  Name: Luke Davila MRN: [id number] Date of Birth: [birth year] Referring Provider: Dr. Maureen Ralphs  Encounter Date: 06/25/2015      PT End of Session - 06/25/15 1118    Visit Number 17   Number of Visits 20   Date for PT Re-Evaluation 07/03/15   PT Start Time 1116   PT Stop Time 1159   PT Time Calculation (min) 43 min   Activity Tolerance Patient tolerated treatment well   Behavior During Therapy [facility] for tasks assessed/performed      Past Medical History  Diagnosis Date  . Gout   . Avascular necrosis of hip (HCC)     LEFT  . Enlarged prostate     Past Surgical History  Procedure Laterality Date  . Foot surgery Left 1985  . Left hand surgery    . Total hip arthroplasty Left 04/09/2015    Procedure: LEFT TOTAL HIP ARTHROPLASTY ANTERIOR APPROACH;  Surgeon: Gaynelle Arabian, MD;  Location: [facility];  Service: Orthopedics;  Laterality: Left;    There were no vitals filed for this visit.      Subjective Assessment - 06/25/15 1116    Subjective Reports that his lower L leg has now began to swell unknown reason although he has been walking more at work. Hasn't noticed much of a difference with standing at more frequent intervals at work. Returns to MD tomorrow 06/26/2015.   Limitations Walking   How long can you walk comfortably? 30 min   Currently in Pain? Yes   Pain Score 2   with driving to clinic   Pain Location Hip   Pain Orientation Left   Pain Descriptors / Indicators Sore   Pain Type Surgical pain   Pain Onset More than a month ago            [facility] PT Assessment - 06/25/15 0001    Assessment   Medical Diagnosis Left total hip replacement (anterior).   Onset Date/Surgical Date 04/09/15   Next MD Visit 06/26/2015   Restrictions   Weight Bearing Restrictions No   ROM / Strength    AROM / PROM / Strength AROM;Strength   AROM   Overall AROM  Deficits   AROM Assessment Site Knee   Right/Left Hip --   Right/Left Knee Left   Left Knee Extension -8   Strength   Overall Strength Within functional limits for tasks performed   Strength Assessment Site Hip;Knee   Right/Left Hip Left   Left Hip Flexion 5/5   Left Hip Extension 5/5   Right/Left Knee Left   Left Knee Flexion 5/5   Left Knee Extension 5/5                     [facility] Adult PT Treatment/Exercise - 06/25/15 0001    Knee/Hip Exercises: Stretches   Hip Flexor Stretch Left;3 reps;30 seconds   Knee/Hip Exercises: Aerobic   Nustep L5 x15 min   Knee/Hip Exercises: Standing   Terminal Knee Extension Limitations LLE Pink XTS 3-5 sec hold x30 reps   Manual Therapy   Manual Therapy Myofascial release   Myofascial Release Deep STW/MFR to R hip flexor, incision, lateral hip to decrease tightness in supine                     PT Long Term Goals -  06/25/15 1141    PT LONG TERM GOAL #1   Title I with HEP   Time 4   Period Weeks   Status Achieved   PT LONG TERM GOAL #2   Title Able to ambulate with left  hip pain not > 2/10.   Time 4   Period Weeks   Status Achieved   PT LONG TERM GOAL #3   Title Achieve full left knee extension in order to normalize gait pattern.   Time 4   Period Weeks   Status On-going  AROM L knee ext -8 deg from neutral 06/25/2015   PT LONG TERM GOAL #4   Title Perform ADL's with pain not > 2/10.   Time 6   Period Weeks   Status Achieved   PT LONG TERM GOAL #5   Title Patient able to sleep without waking due to pain.   Time 4   Status Partially Met  Attributes sleeping well to medications per patient report 06/25/2015               Plan - 06/25/15 1204    Clinical Impression Statement Patient arrived to clinic today with increased swelling in L calf area and patient also stated he has worked three days straight. Patient demonstrated good form and  quad contraction with LLE TKE. Patient continues to be able to feel L hip flexor stretch in standing and reports he will try that stretch at work. AROM L knee extension progressed minimally to 8 deg from neutral and L hip and knee MMT measured as 5/5. Patient continues to present with tightness of the L hip flexor and into lateral L hip. Patient reports that following manual therapy his hip felt "better."   Rehab Potential Good   PT Frequency 2x / week   PT Duration 4 weeks   PT Treatment/Interventions ADLs/Self Care Home Management;Cryotherapy;Electrical Stimulation;Moist Heat;Therapeutic exercise;Therapeutic activities;Manual techniques;Gait training;Patient/family education;Neuromuscular re-education;Passive range of motion   PT Next Visit Plan MD note for MD appointment 06/26/15. Continue focusing on releasing L hip flexor, TFL and piriformis. Also, equal WB, SLS activities on L, TKE and strengthening.   PT Home Exercise Plan standing hip flexor stretch with left leg on chair.   Consulted and Agree with Plan of Care Patient      Patient will benefit from skilled therapeutic intervention in order to improve the following deficits and impairments:  Abnormal gait, Pain, Decreased activity tolerance, Decreased range of motion, Impaired flexibility, Decreased strength  Visit Diagnosis: Pain in left hip  Stiffness of left knee, not elsewhere classified     Problem List Patient Active Problem List   Diagnosis Date Noted  . OA (osteoarthritis) of hip 04/09/2015  . Avascular necrosis of bone of left hip ([place]) 03/06/2015  . Hypogonadism in male 09/12/2014  . Erectile dysfunction 06/25/2012  . Gout 06/25/2012    Ahmed Prima, PTA 06/25/2015 2:05 PM Mali Applegate MPT [facility] [address] [place], [place], [address] Phone: [phone]   Fax:  [phone]  Name: Luke Davila MRN: [id number] Date of Birth: 26-Nov-1965

## 2015-06-26 ENCOUNTER — Ambulatory Visit (INDEPENDENT_AMBULATORY_CARE_PROVIDER_SITE_OTHER): Payer: 59

## 2015-06-26 DIAGNOSIS — E349 Endocrine disorder, unspecified: Secondary | ICD-10-CM

## 2015-06-26 DIAGNOSIS — E291 Testicular hypofunction: Secondary | ICD-10-CM | POA: Diagnosis not present

## 2015-06-28 ENCOUNTER — Encounter: Payer: 59 | Admitting: Physical Therapy

## 2015-07-03 ENCOUNTER — Ambulatory Visit: Payer: 59 | Admitting: Physical Therapy

## 2015-07-03 ENCOUNTER — Encounter: Payer: Self-pay | Admitting: Physical Therapy

## 2015-07-03 ENCOUNTER — Ambulatory Visit (INDEPENDENT_AMBULATORY_CARE_PROVIDER_SITE_OTHER): Payer: 59 | Admitting: *Deleted

## 2015-07-03 DIAGNOSIS — E291 Testicular hypofunction: Secondary | ICD-10-CM | POA: Diagnosis not present

## 2015-07-03 DIAGNOSIS — E349 Endocrine disorder, unspecified: Secondary | ICD-10-CM

## 2015-07-03 DIAGNOSIS — M25552 Pain in left hip: Secondary | ICD-10-CM | POA: Diagnosis not present

## 2015-07-03 DIAGNOSIS — M25662 Stiffness of left knee, not elsewhere classified: Secondary | ICD-10-CM

## 2015-07-03 NOTE — Therapy (Signed)
[facility] [place], [place], [address] Phone: [phone]   Fax:  [phone]  Physical Therapy Treatment  Patient Details  Name: Luke Davila MRN: [id number] Date of Birth: 07/25/1965 Referring Provider: Dr. Maureen Ralphs  Encounter Date: 07/03/2015      PT End of Session - 07/03/15 0738    Visit Number 18   Number of Visits 20   Date for PT Re-Evaluation 07/03/15   PT Start Time 0735   PT Stop Time 0818   PT Time Calculation (min) 43 min   Activity Tolerance Patient tolerated treatment well   Behavior During Therapy [facility] for tasks assessed/performed      Past Medical History  Diagnosis Date  . Gout   . Avascular necrosis of hip (HCC)     LEFT  . Enlarged prostate     Past Surgical History  Procedure Laterality Date  . Foot surgery Left 1985  . Left hand surgery    . Total hip arthroplasty Left 04/09/2015    Procedure: LEFT TOTAL HIP ARTHROPLASTY ANTERIOR APPROACH;  Surgeon: Gaynelle Arabian, MD;  Location: [facility];  Service: Orthopedics;  Laterality: Left;    There were no vitals filed for this visit.      Subjective Assessment - 07/03/15 0736    Subjective Reports that this is the best he has felt in a long time. Reports that he worked four days in a row and felt it.   Limitations Walking   How long can you walk comfortably? 30 min   Currently in Pain? Other (Comment)  Gave no numerical pain rating 07/03/2015            [facility] PT Assessment - 07/03/15 0001    Assessment   Medical Diagnosis Left total hip replacement (anterior).   Onset Date/Surgical Date 04/09/15   Next MD Visit 09/2015   Restrictions   Weight Bearing Restrictions No                     [facility] Adult PT Treatment/Exercise - 07/03/15 0001    Knee/Hip Exercises: Stretches   Hip Flexor Stretch Left;3 reps;30 seconds   Knee/Hip Exercises: Aerobic   Nustep L6 x12 min   Knee/Hip Exercises: Standing   Forward Lunges Left;2  sets;10 reps;2 seconds   Terminal Knee Extension Limitations LLE Pink XTS 3-5 sec hold x30 reps   Hip Extension Stengthening;Left;2 sets;10 reps  Knee slightly flexed with pink XTS   Functional Squat 2 sets;10 reps   Manual Therapy   Manual Therapy Myofascial release   Myofascial Release STW to L hip flexor/incision to decrease tightness in supine             Balance Exercises - 07/03/15 0803    Balance Exercises: Standing   Tandem Stance Eyes open;3 reps;30 secs;Upper extremity support 2   SLS Eyes open;Solid surface;Intermittent upper extremity support;3 reps  3x fatigue   Other Standing Exercises LLE SLS star balance x10 reps                 PT Long Term Goals - 06/25/15 1141    PT LONG TERM GOAL #1   Title I with HEP   Time 4   Period Weeks   Status Achieved   PT LONG TERM GOAL #2   Title Able to ambulate with left  hip pain not > 2/10.   Time 4   Period Weeks   Status Achieved   PT LONG TERM GOAL #3  Title Achieve full left knee extension in order to normalize gait pattern.   Time 4   Period Weeks   Status On-going  AROM L knee ext -8 deg from neutral 06/25/2015   PT LONG TERM GOAL #4   Title Perform ADL's with pain not > 2/10.   Time 6   Period Weeks   Status Achieved   PT LONG TERM GOAL #5   Title Patient able to sleep without waking due to pain.   Time 4   Status Partially Met  Attributes sleeping well to medications per patient report 06/25/2015               Plan - 07/03/15 0849    Clinical Impression Statement Patient continues to progress following L THR with decreased hip flexor tightness and pain per patient report. Patient reports he has been completing stretches and squats at work as well as getting up from chair more often at work now to decrease possiblity of tightness. Patient responded well to new exercises such as squats and forward lunges with no reports of pain. Patient reported fatigue with balance activities especially  STAR balance and SLS actvities. Patient presented with decreased L hip flexor tightness upon palpation.   Rehab Potential Good   PT Frequency 2x / week   PT Duration 4 weeks   PT Treatment/Interventions ADLs/Self Care Home Management;Cryotherapy;Electrical Stimulation;Moist Heat;Therapeutic exercise;Therapeutic activities;Manual techniques;Gait training;Patient/family education;Neuromuscular re-education;Passive range of motion   PT Next Visit Plan Continue focusing on releasing L hip flexor, TFL and piriformis. Also, equal WB, SLS activities on L, TKE and strengthening.   PT Home Exercise Plan standing hip flexor stretch with left leg on chair.   Consulted and Agree with Plan of Care Patient      Patient will benefit from skilled therapeutic intervention in order to improve the following deficits and impairments:  Abnormal gait, Pain, Decreased activity tolerance, Decreased range of motion, Impaired flexibility, Decreased strength  Visit Diagnosis: Pain in left hip  Stiffness of left knee, not elsewhere classified     Problem List Patient Active Problem List   Diagnosis Date Noted  . OA (osteoarthritis) of hip 04/09/2015  . Avascular necrosis of bone of left hip ([place]) 03/06/2015  . Hypogonadism in male 09/12/2014  . Erectile dysfunction 06/25/2012  . Gout 06/25/2012    Wynelle Fanny, PTA 07/03/2015, 8:58 AM  [facility] [address] [place], [place], [address] Phone: [phone]   Fax:  [phone]  Name: Luke Davila MRN: [id number] Date of Birth: 04-Jan-1966

## 2015-07-03 NOTE — Patient Instructions (Signed)

## 2015-07-03 NOTE — Progress Notes (Signed)
Testosterone injection given and patient tolerated well.  

## 2015-07-05 ENCOUNTER — Ambulatory Visit: Payer: 59 | Admitting: Physical Therapy

## 2015-07-05 ENCOUNTER — Encounter: Payer: Self-pay | Admitting: Physical Therapy

## 2015-07-05 DIAGNOSIS — M25552 Pain in left hip: Secondary | ICD-10-CM

## 2015-07-05 DIAGNOSIS — M25662 Stiffness of left knee, not elsewhere classified: Secondary | ICD-10-CM

## 2015-07-05 NOTE — Addendum Note (Signed)
Addended by: Florita Nitsch, [place] W on: 07/05/2015 04:42 PM   Modules accepted: Orders

## 2015-07-05 NOTE — Therapy (Addendum)
[facility] [place], [place], [address] Phone: [phone]   Fax:  [phone]  Physical Therapy Treatment  Patient Details  Name: Luke Davila MRN: [id number] Date of Birth: April 30, 1965 Referring Provider: Dr. Maureen Ralphs  Encounter Date: 07/05/2015      PT End of Session - 07/05/15 0820    Visit Number 19   Number of Visits 20   Date for PT Re-Evaluation 07/03/15   PT Start Time 0815   PT Stop Time 0853   PT Time Calculation (min) 38 min   Activity Tolerance Patient tolerated treatment well   Behavior During Therapy [facility] for tasks assessed/performed      Past Medical History  Diagnosis Date  . Gout   . Avascular necrosis of hip (HCC)     LEFT  . Enlarged prostate     Past Surgical History  Procedure Laterality Date  . Foot surgery Left 1985  . Left hand surgery    . Total hip arthroplasty Left 04/09/2015    Procedure: LEFT TOTAL HIP ARTHROPLASTY ANTERIOR APPROACH;  Surgeon: Gaynelle Arabian, MD;  Location: [facility];  Service: Orthopedics;  Laterality: Left;    There were no vitals filed for this visit.      Subjective Assessment - 07/05/15 0819    Subjective Reports that his hip feels good after working 12 hours and did his exercises at work.   Limitations Walking   How long can you walk comfortably? 30 min   Currently in Pain? No/denies            [facility] PT Assessment - 07/05/15 0001    Assessment   Medical Diagnosis Left total hip replacement (anterior).   Onset Date/Surgical Date 04/09/15   Next MD Visit 09/2015   Restrictions   Weight Bearing Restrictions No                     [facility] Adult PT Treatment/Exercise - 07/05/15 0001    Knee/Hip Exercises: Stretches   Hip Flexor Stretch Left;3 reps;30 seconds   Knee/Hip Exercises: Aerobic   Nustep L6 x12 min   Knee/Hip Exercises: Standing   Forward Lunges Left;2 sets;10 reps;2 seconds   Terminal Knee Extension Limitations LLE Pink XTS 3-5  sec hold x30 reps   Functional Squat 3 sets;10 reps   Knee/Hip Exercises: Supine   Straight Leg Raises Strengthening;Left;3 sets;10 reps   Straight Leg Raise with External Rotation Strengthening;Left;2 sets;10 reps   Knee/Hip Exercises: Sidelying   Hip ABduction Strengthening;Left;2 sets;10 reps             Balance Exercises - 07/05/15 0855    Balance Exercises: Standing   SLS Eyes open;Solid surface;3 reps;Upper extremity support 2  3x fatigue   Rebounder Static;Single leg  3D rebounder 2# ball R toe touch for balance x30 reps each   Other Standing Exercises LLE SLS star balance x20 reps                 PT Long Term Goals - 06/25/15 1141    PT LONG TERM GOAL #1   Title I with HEP   Time 4   Period Weeks   Status Achieved   PT LONG TERM GOAL #2   Title Able to ambulate with left  hip pain not > 2/10.   Time 4   Period Weeks   Status Achieved   PT LONG TERM GOAL #3   Title Achieve full left knee extension in order to normalize  gait pattern.   Time 4   Period Weeks   Status On-going  AROM L knee ext -8 deg from neutral 06/25/2015   PT LONG TERM GOAL #4   Title Perform ADL's with pain not > 2/10.   Time 6   Period Weeks   Status Achieved   PT LONG TERM GOAL #5   Title Patient able to sleep without waking due to pain.   Time 4   Status Partially Met  Attributes sleeping well to medications per patient report 06/25/2015               Plan - 07/05/15 0857    Clinical Impression Statement Patient continues to progress with exercises as he denies pain and reports less tightness in L hip. Patient continues to complete exercises at work to decrease potential to experience L hip tightness. Patient able to complete all exercises well following minimal multimodal cueing for proper exercise technique. Patient reported no increased L hip pain with any of the exercises only faitgue especially with SLR with ER. Patient required R toe touch or 2 UE support for  balance exercises in SLS. Patient denied L hip pain following today's treatment only fatigue following today's treatment.   Rehab Potential Good   PT Frequency 2x / week   PT Duration 4 weeks   PT Treatment/Interventions ADLs/Self Care Home Management;Cryotherapy;Electrical Stimulation;Moist Heat;Therapeutic exercise;Therapeutic activities;Manual techniques;Gait training;Patient/family education;Neuromuscular re-education;Passive range of motion   PT Next Visit Plan Continue focusing on releasing L hip flexor, TFL and piriformis. Also, equal WB, SLS activities on L, TKE and strengthening.   PT Home Exercise Plan standing hip flexor stretch with left leg on chair.   Consulted and Agree with Plan of Care Patient      Patient will benefit from skilled therapeutic intervention in order to improve the following deficits and impairments:  Abnormal gait, Pain, Decreased activity tolerance, Decreased range of motion, Impaired flexibility, Decreased strength  Visit Diagnosis: Pain in left hip  Stiffness of left knee, not elsewhere classified     Problem List Patient Active Problem List   Diagnosis Date Noted  . OA (osteoarthritis) of hip 04/09/2015  . Avascular necrosis of bone of left hip ([place]) 03/06/2015  . Hypogonadism in male 09/12/2014  . Erectile dysfunction 06/25/2012  . Gout 06/25/2012    Wynelle Fanny, PTA 07/05/2015, 9:00 AM  [facility] [address] [place], [place], [address] Phone: [phone]   Fax:  [phone]  Name: Luke Davila MRN: [id number] Date of Birth: 11/21/1965

## 2015-07-09 ENCOUNTER — Ambulatory Visit: Payer: 59 | Attending: Orthopedic Surgery | Admitting: Physical Therapy

## 2015-07-09 ENCOUNTER — Ambulatory Visit (INDEPENDENT_AMBULATORY_CARE_PROVIDER_SITE_OTHER): Payer: 59 | Admitting: *Deleted

## 2015-07-09 DIAGNOSIS — E291 Testicular hypofunction: Secondary | ICD-10-CM | POA: Diagnosis not present

## 2015-07-09 DIAGNOSIS — M25662 Stiffness of left knee, not elsewhere classified: Secondary | ICD-10-CM

## 2015-07-09 DIAGNOSIS — E349 Endocrine disorder, unspecified: Secondary | ICD-10-CM

## 2015-07-09 NOTE — Patient Instructions (Signed)

## 2015-07-09 NOTE — Progress Notes (Signed)
Testosterone injection given and patient tolerated well.  

## 2015-07-09 NOTE — Therapy (Addendum)
[facility] [place], [place], [address] Phone: [phone]   Fax:  [phone]  Physical Therapy Treatment  Patient Details  Name: Luke Davila MRN: [id number] Date of Birth: 1965/03/11 Referring Provider: Dr. Maureen Ralphs  Encounter Date: 07/09/2015    Past Medical History:  Diagnosis Date  . Avascular necrosis of hip (HCC)    LEFT  . Enlarged prostate   . Glaucoma   . Gout   . History of gout     Past Surgical History:  Procedure Laterality Date  . COLONOSCOPY WITH PROPOFOL N/A 11/27/2015   Procedure: COLONOSCOPY WITH PROPOFOL;  Surgeon: Danie Binder, MD;  Location: [facility];  Service: Endoscopy;  Laterality: N/A;  1100  . FOOT SURGERY Left 1985  . Left hand surgery    . TOTAL HIP ARTHROPLASTY Left 04/09/2015   Procedure: LEFT TOTAL HIP ARTHROPLASTY ANTERIOR APPROACH;  Surgeon: Gaynelle Arabian, MD;  Location: [facility];  Service: Orthopedics;  Laterality: Left;    There were no vitals filed for this visit.                                    PT Long Term Goals - 06/25/15 1141      PT LONG TERM GOAL #1   Title I with HEP   Time 4   Period Weeks   Status Achieved     PT LONG TERM GOAL #2   Title Able to ambulate with left  hip pain not > 2/10.   Time 4   Period Weeks   Status Achieved     PT LONG TERM GOAL #3   Title Achieve full left knee extension in order to normalize gait pattern.   Time 4   Period Weeks   Status On-going  AROM L knee ext -8 deg from neutral 06/25/2015     PT LONG TERM GOAL #4   Title Perform ADL's with pain not > 2/10.   Time 6   Period Weeks   Status Achieved     PT LONG TERM GOAL #5   Title Patient able to sleep without waking due to pain.   Time 4   Status Partially Met  Attributes sleeping well to medications per patient report 06/25/2015             Patient will benefit from skilled therapeutic intervention in order to improve the  following deficits and impairments:  Abnormal gait, Pain, Decreased activity tolerance, Decreased range of motion, Impaired flexibility, Decreased strength  Visit Diagnosis: Stiffness of left knee, not elsewhere classified     Problem List Patient Active Problem List   Diagnosis Date Noted  . Constipation 10/29/2015  . OA (osteoarthritis) of hip 04/09/2015  . Avascular necrosis of bone of left hip ([place]) 03/06/2015  . Hypogonadism in male 09/12/2014  . Erectile dysfunction 06/25/2012  . Gout 06/25/2012    Madelyn Flavors PT  12/04/2015, 11:37 AM  [facility] [address] [place], [place], [address] Phone: [phone]   Fax:  [phone]  Name: Jjesus Dingley MRN: [id number] Date of Birth: 1965/08/03  PHYSICAL THERAPY DISCHARGE SUMMARY  Visits from Start of Care: 20.  Current functional level related to goals / functional outcomes: See above.   Remaining deficits: Loss of left knee extension.   Education / Equipment: HEP. Plan: Patient agrees to discharge.  Patient goals were partially met. Patient is being  discharged due to being pleased with the current functional level.  ?????         Mali Applegate MPT

## 2015-07-12 ENCOUNTER — Encounter: Payer: 59 | Admitting: *Deleted

## 2015-07-17 ENCOUNTER — Ambulatory Visit (INDEPENDENT_AMBULATORY_CARE_PROVIDER_SITE_OTHER): Payer: 59 | Admitting: *Deleted

## 2015-07-17 DIAGNOSIS — E291 Testicular hypofunction: Secondary | ICD-10-CM | POA: Diagnosis not present

## 2015-07-17 DIAGNOSIS — E349 Endocrine disorder, unspecified: Secondary | ICD-10-CM

## 2015-07-18 NOTE — Progress Notes (Signed)
Pt given Testosterone injection IM RUOQ and tolerated well. 

## 2015-07-23 ENCOUNTER — Ambulatory Visit (INDEPENDENT_AMBULATORY_CARE_PROVIDER_SITE_OTHER): Payer: 59 | Admitting: *Deleted

## 2015-07-23 DIAGNOSIS — E349 Endocrine disorder, unspecified: Secondary | ICD-10-CM

## 2015-07-23 DIAGNOSIS — E291 Testicular hypofunction: Secondary | ICD-10-CM

## 2015-07-23 NOTE — Patient Instructions (Signed)

## 2015-07-23 NOTE — Progress Notes (Signed)
Testosterone injection given and patient tolerated well.  

## 2015-07-31 ENCOUNTER — Ambulatory Visit (INDEPENDENT_AMBULATORY_CARE_PROVIDER_SITE_OTHER): Payer: 59 | Admitting: *Deleted

## 2015-07-31 DIAGNOSIS — E291 Testicular hypofunction: Secondary | ICD-10-CM | POA: Diagnosis not present

## 2015-07-31 DIAGNOSIS — E349 Endocrine disorder, unspecified: Secondary | ICD-10-CM

## 2015-07-31 NOTE — Progress Notes (Signed)
Pt given Testosterone injection IM LUOQ and tolerated well. ?

## 2015-08-06 ENCOUNTER — Ambulatory Visit (INDEPENDENT_AMBULATORY_CARE_PROVIDER_SITE_OTHER): Payer: 59 | Admitting: *Deleted

## 2015-08-06 DIAGNOSIS — E291 Testicular hypofunction: Secondary | ICD-10-CM | POA: Diagnosis not present

## 2015-08-06 DIAGNOSIS — E349 Endocrine disorder, unspecified: Secondary | ICD-10-CM

## 2015-08-06 NOTE — Patient Instructions (Signed)

## 2015-08-06 NOTE — Progress Notes (Signed)
Testosterone injection given and patient tolerated well.  

## 2015-08-09 ENCOUNTER — Other Ambulatory Visit: Payer: Self-pay | Admitting: Family Medicine

## 2015-08-14 ENCOUNTER — Ambulatory Visit (INDEPENDENT_AMBULATORY_CARE_PROVIDER_SITE_OTHER): Payer: 59

## 2015-08-14 DIAGNOSIS — E291 Testicular hypofunction: Secondary | ICD-10-CM | POA: Diagnosis not present

## 2015-08-14 NOTE — Progress Notes (Signed)
Testosterone injection to RUOQ, patient tolerated well.

## 2015-08-21 ENCOUNTER — Ambulatory Visit (INDEPENDENT_AMBULATORY_CARE_PROVIDER_SITE_OTHER): Payer: 59 | Admitting: *Deleted

## 2015-08-21 DIAGNOSIS — E291 Testicular hypofunction: Secondary | ICD-10-CM | POA: Diagnosis not present

## 2015-08-21 NOTE — Progress Notes (Signed)
Testosterone injection given and patient tolerated well.  

## 2015-08-21 NOTE — Patient Instructions (Signed)

## 2015-08-28 ENCOUNTER — Ambulatory Visit (INDEPENDENT_AMBULATORY_CARE_PROVIDER_SITE_OTHER): Payer: 59 | Admitting: *Deleted

## 2015-08-28 DIAGNOSIS — E291 Testicular hypofunction: Secondary | ICD-10-CM | POA: Diagnosis not present

## 2015-08-28 NOTE — Progress Notes (Signed)
Pt given Testosterone injection IM RUOQ and tolerated well. 

## 2015-09-04 ENCOUNTER — Ambulatory Visit (INDEPENDENT_AMBULATORY_CARE_PROVIDER_SITE_OTHER): Payer: 59 | Admitting: *Deleted

## 2015-09-04 DIAGNOSIS — E291 Testicular hypofunction: Secondary | ICD-10-CM

## 2015-09-04 NOTE — Progress Notes (Signed)
Pt given Testosterone Injection IM LUOQ and tolerated well. 

## 2015-09-11 ENCOUNTER — Ambulatory Visit (INDEPENDENT_AMBULATORY_CARE_PROVIDER_SITE_OTHER): Payer: 59 | Admitting: Family Medicine

## 2015-09-11 ENCOUNTER — Encounter: Payer: Self-pay | Admitting: Family Medicine

## 2015-09-11 VITALS — BP 144/84 | Temp 97.0°F | Ht 75.0 in | Wt 259.1 lb

## 2015-09-11 DIAGNOSIS — N528 Other male erectile dysfunction: Secondary | ICD-10-CM

## 2015-09-11 DIAGNOSIS — N400 Enlarged prostate without lower urinary tract symptoms: Secondary | ICD-10-CM | POA: Diagnosis not present

## 2015-09-11 DIAGNOSIS — IMO0001 Reserved for inherently not codable concepts without codable children: Secondary | ICD-10-CM

## 2015-09-11 DIAGNOSIS — F528 Other sexual dysfunction not due to a substance or known physiological condition: Secondary | ICD-10-CM

## 2015-09-11 DIAGNOSIS — E291 Testicular hypofunction: Secondary | ICD-10-CM

## 2015-09-11 DIAGNOSIS — F5221 Male erectile disorder: Secondary | ICD-10-CM

## 2015-09-11 MED ORDER — SILDENAFIL CITRATE 20 MG PO TABS: 20.0000 mg | ORAL_TABLET | Freq: Every day | ORAL | 5 refills | Status: DC | PRN

## 2015-09-11 MED ORDER — DICLOFENAC SODIUM 75 MG PO TBEC: 75.0000 mg | DELAYED_RELEASE_TABLET | Freq: Two times a day (BID) | ORAL | 2 refills | Status: DC

## 2015-09-11 NOTE — Progress Notes (Signed)
Subjective:  Patient ID: Luke Davila, male    DOB: March 17, 1965  Age: 50 y.o. MRN: [id number]  CC: Annual Exam (CPE, pt has concerns because his last Glucose was 100 and he is concerned about becoming diabetic)   HPI Luke Davila presents for Follow-up of his hypogonadism and other medical conditions. He does have some residual pain from his recent hip surgery at the left inguinal region.  Annual wellness exam had been performed recently and was not repeated today.   Follow up for testosterone deficiency: Pt. Using medication as directed. Denies any sx referrable to DVT such as edema or erythema of legs. No dyspnea or chest pain. Energy level reported as being good. Libido is normal and denies E.D. Feels strength is adequate and improved from baseline.  He has continued to have nocturia times 5-6 per night in spite of the tamsulosin ttwo at bedtime over the last 3 months. This has caused decreased rest and performance through the day.    History Luke Davila has a past medical history of Avascular necrosis of hip ([place]); Enlarged prostate; and Gout.   He has a past surgical history that includes Foot surgery (Left, 1985); Left hand surgery; and Total hip arthroplasty (Left, 04/09/2015).   His family history includes Hypertension in his mother.He reports that he has never smoked. He has never used smokeless tobacco. He reports that he drinks alcohol. He reports that he does not use drugs.    ROS Review of Systems  Constitutional: Negative for chills, diaphoresis, fever and unexpected weight change.  HENT: Negative for congestion, hearing loss, rhinorrhea and sore throat.   Eyes: Negative for visual disturbance.  Respiratory: Negative for cough and shortness of breath.   Cardiovascular: Negative for chest pain.  Gastrointestinal: Negative for abdominal pain, constipation and diarrhea.  Genitourinary: Negative for dysuria and flank pain.  Musculoskeletal: Negative for arthralgias and joint  swelling.  Skin: Negative for rash.  Neurological: Negative for dizziness and headaches.  Psychiatric/Behavioral: Negative for dysphoric mood and sleep disturbance.    Objective:  BP (!) 144/84   Temp 97 F (36.1 C) (Oral)   Ht '6\' 3"'  (1.905 m)   Wt 259 lb 2 oz (117.5 kg)   BMI 32.39 kg/m   BP Readings from Last 3 Encounters:  09/11/15 (!) 144/84  06/12/15 131/82  04/11/15 139/86    Wt Readings from Last 3 Encounters:  09/11/15 259 lb 2 oz (117.5 kg)  06/12/15 265 lb 12.8 oz (120.6 kg)  04/09/15 263 lb (119.3 kg)     Physical Exam  Constitutional: He is oriented to person, place, and time. He appears well-developed and well-nourished. No distress.  HENT:  Head: Normocephalic and atraumatic.  Right Ear: External ear normal.  Left Ear: External ear normal.  Nose: Nose normal.  Mouth/Throat: Oropharynx is clear and moist.  Eyes: Conjunctivae and EOM are normal. Pupils are equal, round, and reactive to light.  Neck: Normal range of motion. Neck supple. No thyromegaly present.  Cardiovascular: Normal rate, regular rhythm and normal heart sounds.   No murmur heard. Pulmonary/Chest: Effort normal and breath sounds normal. No respiratory distress. He has no wheezes. He has no rales.  Abdominal: Soft. Bowel sounds are normal. He exhibits no distension. There is no tenderness.  Lymphadenopathy:    He has no cervical adenopathy.  Neurological: He is alert and oriented to person, place, and time. He has normal reflexes.  Skin: Skin is warm and dry.  Psychiatric: He has a normal mood and  affect. His behavior is normal. Judgment and thought content normal.     Lab Results  Component Value Date   WBC 3.6 06/12/2015   HGB 10.7 (L) 04/11/2015   HCT 39.9 06/12/2015   PLT 227 06/12/2015   GLUCOSE 100 (H) 06/12/2015   CHOL 243 (H) 04/18/2013   TRIG 126 04/18/2013   HDL 58 04/18/2013   LDLCALC 160 (H) 04/18/2013   ALT 17 06/12/2015   AST 30 06/12/2015   NA 140 06/12/2015   K  4.2 06/12/2015   CL 99 06/12/2015   CREATININE 1.15 06/12/2015   BUN 7 06/12/2015   CO2 25 06/12/2015   TSH 1.880 02/06/2015   PSA 0.4 09/15/2013   INR 1.10 04/03/2015   HGBA1C 5.5 06/20/2014    No results found.  Assessment & Plan:   Luke Davila was seen today for annual exam.  Diagnoses and all orders for this visit:  Hypogonadism in male -     Testosterone,Free and Total  Well adult -     CBC with Differential/Platelet -     CMP14+EGFR -     Lipid panel -     PSA Total (Reflex To Free) -     Urinalysis  ED (erectile dysfunction) of non-organic origin  BPH (benign prostatic hyperplasia) -     Ambulatory referral to Urology  Other male erectile dysfunction  Other orders -     Discontinue: sildenafil (REVATIO) 20 MG tablet; Take 1 tablet (20 mg total) by mouth daily as needed (2-5 as needed). -     sildenafil (REVATIO) 20 MG tablet; Take 1 tablet (20 mg total) by mouth daily as needed (2-5 as needed). -     diclofenac (VOLTAREN) 75 MG EC tablet; Take 1 tablet (75 mg total) by mouth 2 (two) times daily. For muscle and  Joint pain    Meds ordered this encounter  Medications  . DISCONTD: sildenafil (REVATIO) 20 MG tablet    Sig: Take 1 tablet (20 mg total) by mouth daily as needed (2-5 as needed).    Dispense:  50 tablet    Refill:  5  . sildenafil (REVATIO) 20 MG tablet    Sig: Take 1 tablet (20 mg total) by mouth daily as needed (2-5 as needed).    Dispense:  50 tablet    Refill:  5  . diclofenac (VOLTAREN) 75 MG EC tablet    Sig: Take 1 tablet (75 mg total) by mouth 2 (two) times daily. For muscle and  Joint pain    Dispense:  60 tablet    Refill:  2     Follow-up: Return in about 6 months (around 03/10/2016).  Claretta Fraise, M.D.

## 2015-09-12 ENCOUNTER — Other Ambulatory Visit: Payer: Self-pay | Admitting: Family Medicine

## 2015-09-12 ENCOUNTER — Other Ambulatory Visit: Payer: 59

## 2015-09-12 DIAGNOSIS — E291 Testicular hypofunction: Secondary | ICD-10-CM

## 2015-09-12 LAB — CBC WITH DIFFERENTIAL/PLATELET
BASOS ABS: 0 10*3/uL (ref 0.0–0.2)
Basos: 1 %
EOS (ABSOLUTE): 0.4 10*3/uL (ref 0.0–0.4)
Eos: 10 %
HEMOGLOBIN: 15.1 g/dL (ref 12.6–17.7)
Hematocrit: 47.1 % (ref 37.5–51.0)
IMMATURE GRANS (ABS): 0 10*3/uL (ref 0.0–0.1)
Immature Granulocytes: 0 %
LYMPHS: 22 %
Lymphocytes Absolute: 0.8 10*3/uL (ref 0.7–3.1)
MCH: 25.4 pg — AB (ref 26.6–33.0)
MCHC: 32.1 g/dL (ref 31.5–35.7)
MCV: 79 fL (ref 79–97)
MONOCYTES: 15 %
Monocytes Absolute: 0.6 10*3/uL (ref 0.1–0.9)
NEUTROS ABS: 1.9 10*3/uL (ref 1.4–7.0)
Neutrophils: 52 %
PLATELETS: 267 10*3/uL (ref 150–379)
RBC: 5.94 x10E6/uL — AB (ref 4.14–5.80)
RDW: 15.6 % — ABNORMAL HIGH (ref 12.3–15.4)
WBC: 3.8 10*3/uL (ref 3.4–10.8)

## 2015-09-12 LAB — CMP14+EGFR
ALK PHOS: 99 IU/L (ref 39–117)
ALT: 13 IU/L (ref 0–44)
AST: 30 IU/L (ref 0–40)
Albumin/Globulin Ratio: 1.4 (ref 1.2–2.2)
Albumin: 4.5 g/dL (ref 3.5–5.5)
BILIRUBIN TOTAL: 0.5 mg/dL (ref 0.0–1.2)
BUN/Creatinine Ratio: 4 — ABNORMAL LOW (ref 9–20)
BUN: 6 mg/dL (ref 6–24)
CHLORIDE: 97 mmol/L (ref 96–106)
CO2: 25 mmol/L (ref 18–29)
CREATININE: 1.54 mg/dL — AB (ref 0.76–1.27)
Calcium: 9.2 mg/dL (ref 8.7–10.2)
GFR calc Af Amer: 60 mL/min/{1.73_m2} (ref >59)
GFR calc non Af Amer: 52 mL/min/{1.73_m2} — ABNORMAL LOW (ref >59)
GLUCOSE: 89 mg/dL (ref 65–99)
Globulin, Total: 3.2 g/dL (ref 1.5–4.5)
Potassium: 4.1 mmol/L (ref 3.5–5.2)
Sodium: 139 mmol/L (ref 134–144)
TOTAL PROTEIN: 7.7 g/dL (ref 6.0–8.5)

## 2015-09-12 LAB — TESTOSTERONE,FREE AND TOTAL
Testosterone, Free: 33.5 pg/mL — ABNORMAL HIGH (ref 7.2–24.0)
Testosterone: 1286 ng/dL — ABNORMAL HIGH (ref 264–916)

## 2015-09-12 LAB — LIPID PANEL
CHOLESTEROL TOTAL: 188 mg/dL (ref 100–199)
Chol/HDL Ratio: 3 ratio units (ref 0.0–5.0)
HDL: 63 mg/dL (ref >39)
LDL CALC: 110 mg/dL — AB (ref 0–99)
TRIGLYCERIDES: 76 mg/dL (ref 0–149)
VLDL CHOLESTEROL CAL: 15 mg/dL (ref 5–40)

## 2015-09-12 LAB — PSA TOTAL (REFLEX TO FREE): Prostate Specific Ag, Serum: 0.4 ng/mL (ref 0.0–4.0)

## 2015-09-12 MED ORDER — SILDENAFIL CITRATE 20 MG PO TABS: 20.0000 mg | ORAL_TABLET | Freq: Every day | ORAL | 5 refills | Status: DC | PRN

## 2015-09-12 NOTE — Telephone Encounter (Signed)
Medication sent to pharmacy  

## 2015-09-13 ENCOUNTER — Telehealth: Payer: Self-pay

## 2015-09-13 ENCOUNTER — Telehealth: Payer: Self-pay | Admitting: Family Medicine

## 2015-09-13 LAB — TESTOSTERONE,FREE AND TOTAL
TESTOSTERONE FREE: 34.4 pg/mL — AB (ref 7.2–24.0)
TESTOSTERONE: 1441 ng/dL — AB (ref 264–916)

## 2015-09-13 NOTE — Telephone Encounter (Signed)
Insurance company is sending paper and notes about this.

## 2015-09-13 NOTE — Telephone Encounter (Signed)
Let pt. Know he will have to pay for it out of pocket.

## 2015-09-13 NOTE — Telephone Encounter (Signed)
Detailed mess on VM - he will have to pay

## 2015-09-17 ENCOUNTER — Other Ambulatory Visit: Payer: Self-pay | Admitting: Family Medicine

## 2015-09-17 MED ORDER — TESTOSTERONE CYPIONATE 200 MG/ML IM SOLN: 150.0000 mg | INTRAMUSCULAR | 0 refills | Status: DC

## 2015-09-18 ENCOUNTER — Telehealth: Payer: Self-pay | Admitting: Family Medicine

## 2015-09-18 ENCOUNTER — Ambulatory Visit (INDEPENDENT_AMBULATORY_CARE_PROVIDER_SITE_OTHER): Payer: 59 | Admitting: *Deleted

## 2015-09-18 DIAGNOSIS — Z139 Encounter for screening, unspecified: Secondary | ICD-10-CM

## 2015-09-18 DIAGNOSIS — E349 Endocrine disorder, unspecified: Secondary | ICD-10-CM

## 2015-09-18 DIAGNOSIS — E291 Testicular hypofunction: Secondary | ICD-10-CM

## 2015-09-18 NOTE — Progress Notes (Signed)
Pt given testosterone 200 mg inj Tolerated well Also requesting referral for screening colonoscopy Referral entered in epic

## 2015-09-18 NOTE — Telephone Encounter (Signed)
Pt aware of referral status 

## 2015-09-19 ENCOUNTER — Telehealth: Payer: Self-pay | Admitting: Family Medicine

## 2015-09-21 NOTE — Telephone Encounter (Signed)
lmtcb

## 2015-09-24 ENCOUNTER — Other Ambulatory Visit: Payer: Self-pay | Admitting: Family Medicine

## 2015-09-24 ENCOUNTER — Telehealth: Payer: Self-pay

## 2015-09-24 NOTE — Telephone Encounter (Signed)
[phone] patient received letter to schedule tcs

## 2015-09-25 ENCOUNTER — Ambulatory Visit: Payer: 59

## 2015-09-25 ENCOUNTER — Other Ambulatory Visit: Payer: Self-pay | Admitting: Family Medicine

## 2015-09-25 ENCOUNTER — Telehealth: Payer: Self-pay | Admitting: Family Medicine

## 2015-09-25 ENCOUNTER — Telehealth: Payer: Self-pay | Admitting: *Deleted

## 2015-09-25 MED ORDER — TESTOSTERONE CYPIONATE 200 MG/ML IM SOLN: 150.0000 mg | INTRAMUSCULAR | 1 refills | Status: DC

## 2015-09-25 NOTE — Telephone Encounter (Signed)
Scrip Printed

## 2015-09-25 NOTE — Telephone Encounter (Signed)
Patient needs rx for testosterone since lowered to 150mg 

## 2015-09-25 NOTE — Telephone Encounter (Signed)
Rx was refused because we have recently lowered his dose to 150mg  weekly.

## 2015-09-25 NOTE — Telephone Encounter (Signed)
Please address

## 2015-09-25 NOTE — Telephone Encounter (Signed)
I sent in the requested prescription 

## 2015-10-03 ENCOUNTER — Telehealth: Payer: Self-pay

## 2015-10-03 NOTE — Telephone Encounter (Signed)
Pt called this afternoon upset that no one has returned his call from 9/18. He is wanting to be triaged and I explained to him that DS had many triages and she does them as they come in and she is aware to call him. [phone]

## 2015-10-04 NOTE — Telephone Encounter (Signed)
LMOM to call.

## 2015-10-04 NOTE — Telephone Encounter (Signed)
I spoke to pt and he is having constipation and said he has to use laxatives some to even have a bowel movement. Ov with Wynne DustEric Gill, NP on 10/29/2015 at 9:30 AM.

## 2015-10-04 NOTE — Telephone Encounter (Signed)
See note dated 10/03/2015.

## 2015-10-07 ENCOUNTER — Other Ambulatory Visit: Payer: Self-pay | Admitting: Family Medicine

## 2015-10-11 ENCOUNTER — Other Ambulatory Visit: Payer: Self-pay | Admitting: Family Medicine

## 2015-10-12 ENCOUNTER — Ambulatory Visit (INDEPENDENT_AMBULATORY_CARE_PROVIDER_SITE_OTHER): Payer: 59 | Admitting: Family Medicine

## 2015-10-12 ENCOUNTER — Ambulatory Visit (INDEPENDENT_AMBULATORY_CARE_PROVIDER_SITE_OTHER): Payer: 59

## 2015-10-12 ENCOUNTER — Encounter: Payer: Self-pay | Admitting: Family Medicine

## 2015-10-12 VITALS — BP 139/89 | HR 88 | Temp 97.3°F | Ht 75.0 in | Wt 261.0 lb

## 2015-10-12 DIAGNOSIS — R0781 Pleurodynia: Secondary | ICD-10-CM | POA: Diagnosis not present

## 2015-10-12 NOTE — Progress Notes (Signed)
BP 139/89   Pulse 88   Temp 97.3 F (36.3 C) (Oral)   Ht 6\' 3"  (1.905 m)   Wt 261 lb (118.4 kg)   BMI 32.62 kg/m    Subjective:    Patient ID: Luke Davila, male    DOB: 1965-09-12, 50 y.o.   MRN: [id number]  HPI: Luke Davila is a 50 y.o. male presenting on 10/12/2015 for Flank Pain (pt here today c/o right side pain)   HPI Right-sided flank pain Patient comes in with right sided rib pain and flank pain. This is been going on since yesterday morning. He awoke with this pain in the morning and had a dream that he had fallen during the night and didn't make it was real but when he awoke with the pain he was concerned that it might have been real the pain does not radiate anywhere else besides that right side of his flank on the right lateral lower ribs. He denies any issues with eating or nausea or constipation or diarrhea or abdominal pain. He denies any fevers or chills. He says the pain is worse with twisting of his torso.  Relevant past medical, surgical, family and social history reviewed and updated as indicated. Interim medical history since our last visit reviewed. Allergies and medications reviewed and updated.  Review of Systems  Constitutional: Negative for chills and fever.  Eyes: Negative for discharge.  Respiratory: Negative for cough, chest tightness, shortness of breath and wheezing.   Cardiovascular: Negative for chest pain and leg swelling.  Musculoskeletal: Positive for arthralgias and myalgias. Negative for back pain and gait problem.  Skin: Negative for color change and rash.  All other systems reviewed and are negative.   Per HPI unless specifically indicated above     Medication List       Accurate as of 10/12/15 10:02 AM. Always use your most recent med list.          diclofenac 75 MG EC tablet Commonly known as:  VOLTAREN Take 1 tablet (75 mg total) by mouth 2 (two) times daily. For muscle and  Joint pain   sildenafil 20 MG tablet Commonly  known as:  REVATIO Take 1 tablet (20 mg total) by mouth daily as needed (2-5 as needed).   tamsulosin 0.4 MG Caps capsule Commonly known as:  FLOMAX TAKE 2 CAPSULES (0.8 MG TOTAL) BY MOUTH DAILY AFTER SUPPER.   testosterone cypionate 200 MG/ML injection Commonly known as:  DEPOTESTOSTERONE CYPIONATE Inject 0.75 mLs (150 mg total) into the muscle once a week.   traMADol 50 MG tablet Commonly known as:  ULTRAM Take 1-2 tablets (50-100 mg total) by mouth every 6 (six) hours as needed (mild pain).   VISINE OP Apply 1 drop to eye 2 (two) times daily.   Vitamin D (Ergocalciferol) 50000 units Caps capsule Commonly known as:  DRISDOL TAKE 1 CAPSULE (50,000 UNITS TOTAL) BY MOUTH 2 (TWO) TIMES A WEEK.          Objective:    BP 139/89   Pulse 88   Temp 97.3 F (36.3 C) (Oral)   Ht 6\' 3"  (1.905 m)   Wt 261 lb (118.4 kg)   BMI 32.62 kg/m   Wt Readings from Last 3 Encounters:  10/12/15 261 lb (118.4 kg)  09/11/15 259 lb 2 oz (117.5 kg)  06/12/15 265 lb 12.8 oz (120.6 kg)    Physical Exam  Constitutional: He is oriented to person, place, and time. He appears well-developed and well-nourished.  No distress.  Eyes: Conjunctivae are normal. Right eye exhibits no discharge. No scleral icterus.  Cardiovascular: Normal rate, regular rhythm, normal heart sounds and intact distal pulses.   No murmur heard. Pulmonary/Chest: Effort normal and breath sounds normal. No respiratory distress. He has no wheezes.  Abdominal: Soft. Bowel sounds are normal. He exhibits no distension. There is no tenderness. There is no rebound.  Musculoskeletal: Normal range of motion. He exhibits no edema.       Lumbar back: He exhibits tenderness.       Back:  Neurological: He is alert and oriented to person, place, and time. Coordination normal.  Skin: Skin is warm and dry. No rash noted. He is not diaphoretic.  Psychiatric: He has a normal mood and affect. His behavior is normal.  Nursing note and vitals  reviewed.  Right rib x-ray: Await final read by radiologist    Assessment & Plan:   Problem List Items Addressed This Visit    None    Visit Diagnoses    Rib pain on right side    -  Primary   Awoke yesterday with the pain, had a dream that he fell and now he thinks he may of been real but he cannot recall, will do x-ray   Relevant Orders   DG Ribs Unilateral W/Chest Right       Follow up plan: Return if symptoms worsen or fail to improve.  Counseling provided for all of the vaccine components Orders Placed This Encounter  Procedures  . DG Ribs Unilateral W/Chest Right    Arville Care, MD [facility] 10/12/2015, 10:02 AM

## 2015-10-29 ENCOUNTER — Ambulatory Visit (INDEPENDENT_AMBULATORY_CARE_PROVIDER_SITE_OTHER): Payer: 59 | Admitting: Nurse Practitioner

## 2015-10-29 ENCOUNTER — Encounter: Payer: Self-pay | Admitting: Nurse Practitioner

## 2015-10-29 ENCOUNTER — Other Ambulatory Visit: Payer: Self-pay

## 2015-10-29 DIAGNOSIS — K5900 Constipation, unspecified: Secondary | ICD-10-CM | POA: Diagnosis not present

## 2015-10-29 DIAGNOSIS — Z1211 Encounter for screening for malignant neoplasm of colon: Secondary | ICD-10-CM

## 2015-10-29 MED ORDER — PEG-KCL-NACL-NASULF-NA ASC-C 100 G PO SOLR: 1.0000 | ORAL | 0 refills | Status: DC

## 2015-10-29 MED ORDER — LUBIPROSTONE 24 MCG PO CAPS: 24.0000 ug | ORAL_CAPSULE | Freq: Two times a day (BID) | ORAL | 0 refills | Status: DC

## 2015-10-29 NOTE — Patient Instructions (Signed)
1. We will schedule your procedure for you. 2. I will give you samples of Amitiza 24 g. Take 1 pill, twice a day, on a full stomach (to prevent nausea). 3. Call us in 1-2 weeks and let us know how this is working for your constipation. 4. Further recommendations to be made after colonoscopy. 5. Return for follow-up in 3 months.

## 2015-10-29 NOTE — Assessment & Plan Note (Signed)
Likely opioid-induced constipation given his pain medications after hip surgery. No previous constipation problems before then. I will try him on and that he is a 24 g twice daily on a full stomach. We will provide samples for 2 weeks. He is to call us with a progress report let us know if it is working. In the meantime, we'll proceed with colonoscopy for routine screening.  Proceed with colonoscopy on propofol/MAC with Dr. Darrick PennaFields in the near future. The risks, benefits, and alternatives have been discussed in detail with the patient. They state understanding and desire to proceed.   The patient is on tramadol and has been on other pain medicine since surgery. Drinks 3 beers a day. We will plan for propofol/MAC to promote adequate sedation.

## 2015-10-29 NOTE — Progress Notes (Addendum)
REVIEWED-NO ADDITIONAL RECOMMENDATIONS.  Primary Care Physician:  Mechele Claude, MD Primary Gastroenterologist:  Dr. Darrick Penna  Chief Complaint  Patient presents with  . Colonoscopy    never had TCS, constipation at times from pain med    HPI:   Luke Davila is a 50 y.o. male who presents to schedule first-ever colonoscopy and discuss constipation. No colonoscopy notes found in our system. Attempted phone triage was aborted due to symptomatic constipation at the time of triage.  Today he states he's doing well overall. Thinks his new constipation was due to pain medication after hip replacement. Is still on some medications for his pain. He typically will not use the bathroom for 3-4 days, will take an OTC laxative and will then have a bowel movement. Denies hematochezia, melena, abdominal pain, N/V, fever, chills. Denies chest pain, dyspnea, dizziness, lightheadedness, syncope, near syncope. Denies any other upper or lower GI symptoms.  States his BP is elevated because his hip is huring and likely "whate coat syndrome."  Past Medical History:  Diagnosis Date  . Avascular necrosis of hip (HCC)    LEFT  . Enlarged prostate   . Gout     Past Surgical History:  Procedure Laterality Date  . FOOT SURGERY Left 1985  . Left hand surgery    . TOTAL HIP ARTHROPLASTY Left 04/09/2015   Procedure: LEFT TOTAL HIP ARTHROPLASTY ANTERIOR APPROACH;  Surgeon: Ollen Gross, MD;  Location: [facility];  Service: Orthopedics;  Laterality: Left;    Current Outpatient Prescriptions  Medication Sig Dispense Refill  . diclofenac (VOLTAREN) 75 MG EC tablet Take 1 tablet (75 mg total) by mouth 2 (two) times daily. For muscle and  Joint pain 60 tablet 2  . tamsulosin (FLOMAX) 0.4 MG CAPS capsule TAKE 2 CAPSULES (0.8 MG TOTAL) BY MOUTH DAILY AFTER SUPPER. 30 capsule 5  . Tetrahydrozoline HCl (VISINE OP) Apply 1 drop to eye 2 (two) times daily.    . traMADol (ULTRAM) 50 MG tablet Take 1-2 tablets (50-100 mg  total) by mouth every 6 (six) hours as needed (mild pain). 80 tablet 1  . Vitamin D, Ergocalciferol, (DRISDOL) 50000 units CAPS capsule TAKE 1 CAPSULE (50,000 UNITS TOTAL) BY MOUTH 2 (TWO) TIMES A WEEK. 16 capsule 0  . sildenafil (REVATIO) 20 MG tablet Take 1 tablet (20 mg total) by mouth daily as needed (2-5 as needed). (Patient not taking: Reported on 10/29/2015) 50 tablet 5  . testosterone cypionate (DEPOTESTOSTERONE CYPIONATE) 200 MG/ML injection Inject 0.75 mLs (150 mg total) into the muscle once a week. (Patient not taking: Reported on 10/29/2015) 10 mL 1   No current facility-administered medications for this visit.     Allergies as of 10/29/2015  . (No Known Allergies)    Family History  Problem Relation Age of Onset  . Hypertension Mother     Social History   Social History  . Marital status: Married    Spouse name: Ramona  . Number of children: 2  . Years of education: 12th   Occupational History  .  Administrator, [employer]   Social History Main Topics  . Smoking status: Never Smoker  . Smokeless tobacco: Never Used  . Alcohol use Yes     Comment: 3 beers daily  . Drug use: No  . Sexual activity: Not on file   Other Topics Concern  . Not on file   Social History Narrative   Patient lives at home with his family.   Caffeine Use: 1  soda daily   Patient is right handed    Review of Systems: Complete ROS negative except as per HPI.    Physical Exam: BP (!) 167/100   Pulse (!) 53   Temp 98.2 F (36.8 C) (Oral)   Ht 6\' 3"  (1.905 m)   Wt 253 lb 3.2 oz (114.9 kg)   BMI 31.65 kg/m  General:   Alert and oriented. Pleasant and cooperative. Well-nourished and well-developed.  Head:  Normocephalic and atraumatic. Eyes:  Without icterus, sclera clear and conjunctiva pink.  Ears:  Normal auditory acuity. Cardiovascular:  S1, S2 present without murmurs appreciated. Extremities without clubbing or edema. Respiratory:  Clear to auscultation bilaterally.  No wheezes, rales, or rhonchi. No distress.  Gastrointestinal:  +BS, soft, non-tender and non-distended. No HSM noted. No guarding or rebound. No masses appreciated.  Rectal:  Deferred  Musculoskalatal:  Symmetrical without gross deformities. Neurologic:  Alert and oriented x4;  grossly normal neurologically. Psych:  Alert and cooperative. Normal mood and affect. Heme/Lymph/Immune: No excessive bruising noted.    10/29/2015 10:19 AM   Disclaimer: This note was dictated with voice recognition software. Similar sounding words can inadvertently be transcribed and may not be corrected upon review.

## 2015-10-29 NOTE — Progress Notes (Signed)
CC'ED TO PCP 

## 2015-11-08 ENCOUNTER — Encounter: Payer: Self-pay | Admitting: Family Medicine

## 2015-11-08 ENCOUNTER — Ambulatory Visit (INDEPENDENT_AMBULATORY_CARE_PROVIDER_SITE_OTHER): Payer: 59 | Admitting: Family Medicine

## 2015-11-08 VITALS — BP 137/96 | HR 92 | Temp 97.2°F | Ht 75.0 in | Wt 257.0 lb

## 2015-11-08 DIAGNOSIS — H1032 Unspecified acute conjunctivitis, left eye: Secondary | ICD-10-CM | POA: Diagnosis not present

## 2015-11-08 MED ORDER — TOBRAMYCIN-DEXAMETHASONE 0.3-0.1 % OP SUSP: OPHTHALMIC | 0 refills | Status: DC

## 2015-11-08 NOTE — Progress Notes (Signed)
Subjective:  Patient ID: Luke Davila, male    DOB: September 30, 1965  Age: 50 y.o. MRN: [id number]  CC: Eye Pain (left)   HPI Luke Davila presents for No known injury. Woke yesterday morning with pain and redness in the left eye. Notes it's been swelling. There is pain around the eye. He did have some mattering earlier noted. No fever chills or sweats. No upper respiratory symptoms otherwise.   History Luke Davila has a past medical history of Avascular necrosis of hip (HCC); Enlarged prostate; and Gout.   He has a past surgical history that includes Foot surgery (Left, 1985); Left hand surgery; and Total hip arthroplasty (Left, 04/09/2015).   His family history includes Hypertension in his mother.He reports that he has never smoked. He has never used smokeless tobacco. He reports that he drinks alcohol. He reports that he does not use drugs.    ROS Review of Systems Normal except as per history of present illness Objective:  BP (!) 137/96   Pulse 92   Temp 97.2 F (36.2 C) (Oral)   Ht 6\' 3"  (1.905 m)   Wt 257 lb (116.6 kg)   BMI 32.12 kg/m   BP Readings from Last 3 Encounters:  11/08/15 (!) 137/96  10/29/15 (!) 167/100  10/12/15 139/89    Wt Readings from Last 3 Encounters:  11/08/15 257 lb (116.6 kg)  10/29/15 253 lb 3.2 oz (114.9 kg)  10/12/15 261 lb (118.4 kg)     Physical Exam  Eyes: EOM are normal. Pupils are equal, round, and reactive to light. Lids are everted and swept, no foreign bodies found. Right eye exhibits no chemosis, no discharge and no exudate. Left eye exhibits chemosis, discharge and exudate. Left eye exhibits no hordeolum. No foreign body present in the left eye. Right conjunctiva is not injected. Left conjunctiva is injected. Left conjunctiva has no hemorrhage.     Lab Results  Component Value Date   WBC 3.8 09/11/2015   HGB 10.7 (L) 04/11/2015   HCT 47.1 09/11/2015   PLT 267 09/11/2015   GLUCOSE 89 09/11/2015   CHOL 188 09/11/2015   TRIG 76  09/11/2015   HDL 63 09/11/2015   LDLCALC 110 (H) 09/11/2015   ALT 13 09/11/2015   AST 30 09/11/2015   NA 139 09/11/2015   K 4.1 09/11/2015   CL 97 09/11/2015   CREATININE 1.54 (H) 09/11/2015   BUN 6 09/11/2015   CO2 25 09/11/2015   TSH 1.880 02/06/2015   PSA 0.4 09/15/2013   INR 1.10 04/03/2015   HGBA1C 5.5 06/20/2014    No results found.  Assessment & Plan:   Luke Davila was seen today for eye pain.  Diagnoses and all orders for this visit:  Acute bacterial conjunctivitis of left eye  Other orders -     tobramycin-dexamethasone (TOBRADEX) ophthalmic solution; Apply 1 drop in affected eye(s) every 2 hours for two days. Then every 4 hours for 5 days.      I am having Mr. Albesa SeenCentry start on tobramycin-dexamethasone. I am also having him maintain his Tetrahydrozoline HCl (VISINE OP), traMADol, diclofenac, tamsulosin, Vitamin D (Ergocalciferol), lubiprostone, and peg 3350 powder.  Meds ordered this encounter  Medications  . tobramycin-dexamethasone (TOBRADEX) ophthalmic solution    Sig: Apply 1 drop in affected eye(s) every 2 hours for two days. Then every 4 hours for 5 days.    Dispense:  5 mL    Refill:  0     Follow-up: Return if symptoms worsen or fail  to improve.  Claretta Fraise, M.D.

## 2015-11-16 NOTE — Patient Instructions (Signed)
Loleta RoseSamuel Melching  11/16/2015     @PREFPERIOPPHARMACY @   Your procedure is scheduled on 11/27/2015.  [facility].  Call this number if you have problems the morning of surgery:  [phone]   Remember:  Do not eat food or drink liquids after midnight.  Take these medicines the morning of surgery with A SIP OF WATER Tramadol and Flomax   Do not wear jewelry, make-up or nail polish.  Do not wear lotions, powders, or perfumes, or deoderant.  Do not shave 48 hours prior to surgery.  Men may shave face and neck.  Do not bring valuables to the hospital.  [facility] any belongings or valuables.  Contacts, dentures or bridgework may not be worn into surgery.  Leave your suitcase in the car.  After surgery it may be brought to your room.  For patients admitted to the hospital, discharge time will be determined by your treatment team.  Patients discharged the day of surgery will not be allowed to drive home.    Please read over the following fact sheets that you were given. Anesthesia Post-op Instructions     PATIENT INSTRUCTIONS POST-ANESTHESIA  IMMEDIATELY FOLLOWING SURGERY:  Do not drive or operate machinery for the first twenty four hours after surgery.  Do not make any important decisions for twenty four hours after surgery or while taking narcotic pain medications or sedatives.  If you develop intractable nausea and vomiting or a severe headache please notify your doctor immediately.  FOLLOW-UP:  Please make an appointment with your surgeon as instructed. You do not need to follow up with anesthesia unless specifically instructed to do so.  WOUND CARE INSTRUCTIONS (if applicable):  Keep a dry clean dressing on the anesthesia/puncture wound site if there is drainage.  Once the wound has quit draining you may leave it open to air.  Generally you should leave the bandage intact for twenty four hours unless there is drainage.  If the  epidural site drains for more than 36-48 hours please call the anesthesia department.  QUESTIONS?:  Please feel free to call your physician or the hospital operator if you have any questions, and they will be happy to assist you.      Colonoscopy A colonoscopy is an exam to look at the entire large intestine (colon). This exam can help find problems such as tumors, polyps, inflammation, and areas of bleeding. The exam takes about 1 hour.  LET [facility] KNOW ABOUT:   Any allergies you have.  All medicines you are taking, including vitamins, herbs, eye drops, creams, and over-the-counter medicines.  Previous problems you or members of your family have had with the use of anesthetics.  Any blood disorders you have.  Previous surgeries you have had.  Medical conditions you have. RISKS AND COMPLICATIONS  Generally, this is a safe procedure. However, as with any procedure, complications can occur. Possible complications include:  Bleeding.  Tearing or rupture of the colon wall.  Reaction to medicines given during the exam.  Infection (rare). BEFORE THE PROCEDURE   Ask your health care provider about changing or stopping your regular medicines.  You may be prescribed an oral bowel prep. This involves drinking a large amount of medicated liquid, starting the day before your procedure. The liquid will cause you to have multiple loose stools until your stool is almost clear or light green. This cleans out your colon in preparation for the procedure.  Do not eat or drink anything else once you have started the bowel prep, unless your health care provider tells you it is safe to do so.  Arrange for someone to drive you home after the procedure. PROCEDURE   You will be given medicine to help you relax (sedative).  You will lie on your side with your knees bent.  A long, flexible tube with a light and camera on the end (colonoscope) will be inserted through the rectum  and into the colon. The camera sends video back to a computer screen as it moves through the colon. The colonoscope also releases carbon dioxide gas to inflate the colon. This helps your health care provider see the area better.  During the exam, your health care provider may take a small tissue sample (biopsy) to be examined under a microscope if any abnormalities are found.  The exam is finished when the entire colon has been viewed. AFTER THE PROCEDURE   Do not drive for 24 hours after the exam.  You may have a small amount of blood in your stool.  You may pass moderate amounts of gas and have mild abdominal cramping or bloating. This is caused by the gas used to inflate your colon during the exam.  Ask when your test results will be ready and how you will get your results. Make sure you get your test results.   This information is not intended to replace advice given to you by your health care provider. Make sure you discuss any questions you have with your health care provider.   Document Released: 12/21/1999 Document Revised: 10/13/2012 Document Reviewed: 08/30/2012 Elsevier Interactive Patient Education [employer]2016 [employer] Inc.

## 2015-11-20 ENCOUNTER — Ambulatory Visit (INDEPENDENT_AMBULATORY_CARE_PROVIDER_SITE_OTHER): Payer: 59 | Admitting: Urology

## 2015-11-20 DIAGNOSIS — E291 Testicular hypofunction: Secondary | ICD-10-CM

## 2015-11-20 DIAGNOSIS — N401 Enlarged prostate with lower urinary tract symptoms: Secondary | ICD-10-CM | POA: Diagnosis not present

## 2015-11-20 DIAGNOSIS — N5201 Erectile dysfunction due to arterial insufficiency: Secondary | ICD-10-CM | POA: Diagnosis not present

## 2015-11-21 ENCOUNTER — Encounter (HOSPITAL_COMMUNITY): Payer: Self-pay

## 2015-11-21 ENCOUNTER — Other Ambulatory Visit: Payer: Self-pay

## 2015-11-21 ENCOUNTER — Encounter (HOSPITAL_COMMUNITY)
Admission: RE | Admit: 2015-11-21 | Discharge: 2015-11-21 | Disposition: A | Discharge status: Home / Self Care | Payer: 59 | Source: Ambulatory Visit | Attending: Gastroenterology | Admitting: Gastroenterology

## 2015-11-21 DIAGNOSIS — Z01812 Encounter for preprocedural laboratory examination: Secondary | ICD-10-CM | POA: Insufficient documentation

## 2015-11-21 DIAGNOSIS — Z1211 Encounter for screening for malignant neoplasm of colon: Secondary | ICD-10-CM | POA: Insufficient documentation

## 2015-11-21 DIAGNOSIS — Z01810 Encounter for preprocedural cardiovascular examination: Secondary | ICD-10-CM | POA: Insufficient documentation

## 2015-11-21 HISTORY — DX: Personal history of other diseases of the musculoskeletal system and connective tissue: Z87.39

## 2015-11-21 HISTORY — DX: Unspecified glaucoma: H40.9

## 2015-11-21 LAB — CBC WITH DIFFERENTIAL/PLATELET
Basophils Absolute: 0 K/uL (ref 0.0–0.1)
Basophils Relative: 1 %
Eosinophils Absolute: 0.3 K/uL (ref 0.0–0.7)
Eosinophils Relative: 8 %
HCT: 39.5 % (ref 39.0–52.0)
Hemoglobin: 13.6 g/dL (ref 13.0–17.0)
Lymphocytes Relative: 25 %
Lymphs Abs: 0.9 K/uL (ref 0.7–4.0)
MCH: 26.1 pg (ref 26.0–34.0)
MCHC: 34.4 g/dL (ref 30.0–36.0)
MCV: 75.8 fL — ABNORMAL LOW (ref 78.0–100.0)
Monocytes Absolute: 0.5 K/uL (ref 0.1–1.0)
Monocytes Relative: 14 %
Neutro Abs: 1.8 K/uL (ref 1.7–7.7)
Neutrophils Relative %: 52 %
Platelets: 246 K/uL (ref 150–400)
RBC: 5.21 MIL/uL (ref 4.22–5.81)
RDW: 13.1 % (ref 11.5–15.5)
WBC: 3.4 K/uL — ABNORMAL LOW (ref 4.0–10.5)

## 2015-11-21 LAB — BASIC METABOLIC PANEL WITH GFR
Anion gap: 5 (ref 5–15)
BUN: 12 mg/dL (ref 6–20)
CO2: 25 mmol/L (ref 22–32)
Calcium: 9.1 mg/dL (ref 8.9–10.3)
Chloride: 99 mmol/L — ABNORMAL LOW (ref 101–111)
Creatinine, Ser: 1.41 mg/dL — ABNORMAL HIGH (ref 0.61–1.24)
GFR calc Af Amer: >60 mL/min
GFR calc non Af Amer: 57 mL/min — ABNORMAL LOW
Glucose, Bld: 102 mg/dL — ABNORMAL HIGH (ref 65–99)
Potassium: 4 mmol/L (ref 3.5–5.1)
Sodium: 129 mmol/L — ABNORMAL LOW (ref 135–145)

## 2015-11-27 ENCOUNTER — Encounter (HOSPITAL_COMMUNITY)
Admission: RE | Disposition: A | Discharge status: Home / Self Care | Payer: Self-pay | Source: Ambulatory Visit | Attending: Gastroenterology

## 2015-11-27 ENCOUNTER — Encounter (HOSPITAL_COMMUNITY): Payer: Self-pay | Admitting: *Deleted

## 2015-11-27 ENCOUNTER — Ambulatory Visit (HOSPITAL_COMMUNITY): Payer: 59 | Admitting: Anesthesiology

## 2015-11-27 ENCOUNTER — Ambulatory Visit (HOSPITAL_COMMUNITY)
Admission: RE | Admit: 2015-11-27 | Discharge: 2015-11-27 | Disposition: A | Discharge status: Home / Self Care | Payer: 59 | Source: Ambulatory Visit | Attending: Gastroenterology | Admitting: Gastroenterology

## 2015-11-27 DIAGNOSIS — Z1211 Encounter for screening for malignant neoplasm of colon: Secondary | ICD-10-CM

## 2015-11-27 DIAGNOSIS — M1990 Unspecified osteoarthritis, unspecified site: Secondary | ICD-10-CM | POA: Insufficient documentation

## 2015-11-27 DIAGNOSIS — K644 Residual hemorrhoidal skin tags: Secondary | ICD-10-CM | POA: Diagnosis not present

## 2015-11-27 DIAGNOSIS — K648 Other hemorrhoids: Secondary | ICD-10-CM | POA: Diagnosis not present

## 2015-11-27 DIAGNOSIS — I10 Essential (primary) hypertension: Secondary | ICD-10-CM | POA: Diagnosis not present

## 2015-11-27 DIAGNOSIS — Z96642 Presence of left artificial hip joint: Secondary | ICD-10-CM | POA: Diagnosis not present

## 2015-11-27 DIAGNOSIS — Z1212 Encounter for screening for malignant neoplasm of rectum: Secondary | ICD-10-CM | POA: Diagnosis not present

## 2015-11-27 HISTORY — PX: COLONOSCOPY WITH PROPOFOL: SHX5780

## 2015-11-27 SURGERY — COLONOSCOPY WITH PROPOFOL
Anesthesia: Monitor Anesthesia Care

## 2015-11-27 MED ORDER — LABETALOL HCL 5 MG/ML IV SOLN
10.0000 mg | INTRAVENOUS | Status: DC | PRN
  Administered 2015-11-27 (×2): 10 mg via INTRAVENOUS

## 2015-11-27 MED ORDER — MIDAZOLAM HCL 2 MG/2ML IJ SOLN
INTRAMUSCULAR | Status: AC
  Filled 2015-11-27: qty 2

## 2015-11-27 MED ORDER — FENTANYL CITRATE (PF) 100 MCG/2ML IJ SOLN
INTRAMUSCULAR | Status: AC
  Filled 2015-11-27: qty 2

## 2015-11-27 MED ORDER — CHLORHEXIDINE GLUCONATE CLOTH 2 % EX PADS: 6.0000 | MEDICATED_PAD | Freq: Once | CUTANEOUS | Status: DC

## 2015-11-27 MED ORDER — PROPOFOL 10 MG/ML IV BOLUS
INTRAVENOUS | Status: AC
  Filled 2015-11-27: qty 40

## 2015-11-27 MED ORDER — LABETALOL HCL 5 MG/ML IV SOLN
INTRAVENOUS | Status: AC
  Filled 2015-11-27: qty 4

## 2015-11-27 MED ORDER — LUBIPROSTONE 24 MCG PO CAPS: 24.0000 ug | ORAL_CAPSULE | Freq: Two times a day (BID) | ORAL | 11 refills | Status: DC

## 2015-11-27 MED ORDER — LACTATED RINGERS IV SOLN
INTRAVENOUS | Status: DC
  Administered 2015-11-27: 1000 mL via INTRAVENOUS

## 2015-11-27 MED ORDER — PROPOFOL 500 MG/50ML IV EMUL
INTRAVENOUS | Status: DC | PRN
  Administered 2015-11-27: 125 ug/kg/min via INTRAVENOUS

## 2015-11-27 MED ORDER — MIDAZOLAM HCL 2 MG/2ML IJ SOLN
1.0000 mg | INTRAMUSCULAR | Status: DC | PRN
  Administered 2015-11-27 (×2): 2 mg via INTRAVENOUS
  Filled 2015-11-27: qty 2

## 2015-11-27 MED ORDER — FENTANYL CITRATE (PF) 100 MCG/2ML IJ SOLN
25.0000 ug | INTRAMUSCULAR | Status: AC | PRN
  Administered 2015-11-27 (×2): 25 ug via INTRAVENOUS

## 2015-11-27 MED ORDER — PHENYLEPHRINE HCL 10 MG/ML IJ SOLN
INTRAMUSCULAR | Status: DC | PRN
  Administered 2015-11-27: 40 ug via INTRAVENOUS

## 2015-11-27 NOTE — Discharge Instructions (Signed)
You have internal hemorrhoids. YOU DID NOT HAVE ANY POLYPS.   CONTINUE YOUR WEIGHT LOSS EFFORTS. Lose 10-20 lbs. YOUR BODY MASS INDEX IS OVER 30 WHICH MEANS YOU ARE OBESE. OBESITY IS ASSOCIATED WITH AN INCREASE FOR ALL CANCERS, INCLUDING esophageal and COLON CANCER.  FOLLOW A HIGH FIBER DIET. AVOID ITEMS THAT CAUSE BLOATING. SEE INFO BELOW.  USE PREPARATION H FOUR TIMES  A DAY IF NEEDED TO RELIEVE RECTAL PAIN/PRESSURE/BLEEDING.  Next colonoscopy in 10 years.  Colonoscopy Care After Read the instructions outlined below and refer to this sheet in the next week. These discharge instructions provide you with general information on caring for yourself after you leave the hospital. While your treatment has been planned according to the most current medical practices available, unavoidable complications occasionally occur. If you have any problems or questions after discharge, call DR. Daquisha Clermont, [phone].  ACTIVITY  You may resume your regular activity, but move at a slower pace for the next 24 hours.   Take frequent rest periods for the next 24 hours.   Walking will help get rid of the air and reduce the bloated feeling in your belly (abdomen).   No driving for 24 hours (because of the medicine (anesthesia) used during the test).   You may shower.   Do not sign any important legal documents or operate any machinery for 24 hours (because of the anesthesia used during the test).    NUTRITION  Drink plenty of fluids.   You may resume your normal diet as instructed by your doctor.   Begin with a light meal and progress to your normal diet. Heavy or fried foods are harder to digest and may make you feel sick to your stomach (nauseated).   Avoid alcoholic beverages for 24 hours or as instructed.    MEDICATIONS  You may resume your normal medications.   WHAT YOU CAN EXPECT TODAY  Some feelings of bloating in the abdomen.   Passage of more gas than usual.   Spotting of blood  in your stool or on the toilet paper  .  IF YOU HAD POLYPS REMOVED DURING THE COLONOSCOPY:  Eat a soft diet IF YOU HAVE NAUSEA, BLOATING, ABDOMINAL PAIN, OR VOMITING.    FINDING OUT THE RESULTS OF YOUR TEST Not all test results are available during your visit. DR. Darrick PennaFIELDS WILL CALL YOU WITHIN 7 DAYS OF YOUR PROCEDUE WITH YOUR RESULTS. Do not assume everything is normal if you have not heard from DR. Ryan Palermo IN ONE WEEK, CALL HER OFFICE AT [phone].  SEEK IMMEDIATE MEDICAL ATTENTION AND CALL THE OFFICE: [phone] IF:  You have more than a spotting of blood in your stool.   Your belly is swollen (abdominal distention).   You are nauseated or vomiting.   You have a temperature over 101F.   You have abdominal pain or discomfort that is severe or gets worse throughout the day.  High-Fiber Diet A high-fiber diet changes your normal diet to include more whole grains, legumes, fruits, and vegetables. Changes in the diet involve replacing refined carbohydrates with unrefined foods. The calorie level of the diet is essentially unchanged. The Dietary Reference Intake (recommended amount) for adult males is 38 grams per day. For adult females, it is 25 grams per day. Pregnant and lactating women should consume 28 grams of fiber per day. Fiber is the intact part of a plant that is not broken down during digestion. Functional fiber is fiber that has been isolated from the plant to provide a  beneficial effect in the body. PURPOSE  Increase stool bulk.   Ease and regulate bowel movements.   Lower cholesterol.   REDUCE RISK OF COLON CANCER  INDICATIONS THAT YOU NEED MORE FIBER  Constipation and hemorrhoids.   Uncomplicated diverticulosis (intestine condition) and irritable bowel syndrome.   Weight management.   As a protective measure against hardening of the arteries (atherosclerosis), diabetes, and cancer.   GUIDELINES FOR INCREASING FIBER IN THE DIET  Start adding fiber to the  diet slowly. A gradual increase of about 5 more grams (2 slices of whole-wheat bread, 2 servings of most fruits or vegetables, or 1 bowl of high-fiber cereal) per day is best. Too rapid an increase in fiber may result in constipation, flatulence, and bloating.   Drink enough water and fluids to keep your urine clear or pale yellow. Water, juice, or caffeine-free drinks are recommended. Not drinking enough fluid may cause constipation.   Eat a variety of high-fiber foods rather than one type of fiber.   Try to increase your intake of fiber through using high-fiber foods rather than fiber pills or supplements that contain small amounts of fiber.   The goal is to change the types of food eaten. Do not supplement your present diet with high-fiber foods, but replace foods in your present diet.   INCLUDE A VARIETY OF FIBER SOURCES  Replace refined and processed grains with whole grains, canned fruits with fresh fruits, and incorporate other fiber sources. White rice, white breads, and most bakery goods contain little or no fiber.   Brown whole-grain rice, buckwheat oats, and many fruits and vegetables are all good sources of fiber. These include: broccoli, Brussels sprouts, cabbage, cauliflower, beets, sweet potatoes, white potatoes (skin on), carrots, tomatoes, eggplant, squash, berries, fresh fruits, and dried fruits.   Cereals appear to be the richest source of fiber. Cereal fiber is found in whole grains and bran. Bran is the fiber-rich outer coat of cereal grain, which is largely removed in refining. In whole-grain cereals, the bran remains. In breakfast cereals, the largest amount of fiber is found in those with "bran" in their names. The fiber content is sometimes indicated on the label.   You may need to include additional fruits and vegetables each day.   In baking, for 1 cup white flour, you may use the following substitutions:   1 cup whole-wheat flour minus 2 tablespoons.   1/2 cup  white flour plus 1/2 cup whole-wheat flour.   Hemorrhoids Hemorrhoids are dilated (enlarged) veins around the rectum. Sometimes clots will form in the veins. This makes them swollen and painful. These are called thrombosed hemorrhoids. Causes of hemorrhoids include:  Constipation.   Straining to have a bowel movement.   HEAVY LIFTING  HOME CARE INSTRUCTIONS  Eat a well balanced diet and drink 6 to 8 glasses of water every day to avoid constipation. You may also use a bulk laxative.   Avoid straining to have bowel movements.   Keep anal area dry and clean.   Do not use a donut shaped pillow or sit on the toilet for long periods. This increases blood pooling and pain.   Move your bowels when your body has the urge; this will require less straining and will decrease pain and pressure.

## 2015-11-27 NOTE — Anesthesia Postprocedure Evaluation (Signed)
Anesthesia Post Note  Patient: Luke RoseSamuel Borrero  Procedure(s) Performed: Procedure(s) (LRB): COLONOSCOPY WITH PROPOFOL (N/A)  Patient location during evaluation: PACU Level of consciousness: awake and alert Pain management: pain level controlled Vital Signs Assessment: post-procedure vital signs reviewed and stable Respiratory status: spontaneous breathing Cardiovascular status: blood pressure returned to baseline Postop Assessment: no signs of nausea or vomiting Anesthetic complications: no    Last Vitals:  Vitals:   11/27/15 1105 11/27/15 1145  BP: (!) 146/97 (P) 127/88  Pulse:    Resp: (!) 21 (P) 12  Temp:  (P) 36.4 C    Last Pain:  Vitals:   11/27/15 0947  TempSrc: Oral                 Adekunle Rohrbach

## 2015-11-27 NOTE — Transfer of Care (Signed)
Immediate Anesthesia Transfer of Care Note  Patient: Luke RoseSamuel Davila  Procedure(s) Performed: Procedure(s) with comments: COLONOSCOPY WITH PROPOFOL (N/A) - 1100  Patient Location: PACU  Anesthesia Type:MAC  Level of Consciousness: awake and alert   Airway & Oxygen Therapy: Patient Spontanous Breathing  Post-op Assessment: Report given to RN  Post vital signs: Reviewed and stable  Last Vitals:  Vitals:   11/27/15 1100 11/27/15 1105  BP: (!) 146/94 (!) 146/97  Pulse:    Resp: 17 (!) 21  Temp:      Last Pain:  Vitals:   11/27/15 0947  TempSrc: Oral      Patients Stated Pain Goal: 5 (11/27/15 0947)  Complications: No apparent anesthesia complications

## 2015-11-27 NOTE — Op Note (Signed)
[facility] Patient Name: Luke Davila Procedure Date: 11/27/2015 10:56 AM MRN: [id number] Date of Birth: [birth year] Attending MD: Jonette Eva , MD CSN: [id number] Age: 50 Admit Type: Outpatient Procedure:                Colonoscopy,SCREENING Indications:              Screening for colorectal malignant neoplasm Providers:                Jonette Eva, MD, Jannett Celestine, RN, Toniann Fail                            RN, RN, Birder Robson, Technician Referring MD:             Mechele Claude Medicines:                Propofol per Anesthesia Complications:            No immediate complications. Estimated Blood Loss:     Estimated blood loss: none. Procedure:                Pre-Anesthesia Assessment:                           - Prior to the procedure, a History and Physical                            was performed, and patient medications and                            allergies were reviewed. The patient's tolerance of                            previous anesthesia was also reviewed. The risks                            and benefits of the procedure and the sedation                            options and risks were discussed with the patient.                            All questions were answered, and informed consent                            was obtained. Prior Anticoagulants: The patient has                            taken no previous anticoagulant or antiplatelet                            agents. ASA Grade Assessment: II - A patient with                            mild systemic disease. After reviewing the risks  and benefits, the patient was deemed in                            satisfactory condition to undergo the procedure.                            After obtaining informed consent, the colonoscope                            was passed under direct vision. Throughout the                            procedure, the patient's blood pressure, pulse,  and                            oxygen saturations were monitored continuously. The                            EC-[id number] ([id number]) scope was introduced through                            the anus and advanced to the the cecum, identified                            by appendiceal orifice and ileocecal valve. The                            ileocecal valve, appendiceal orifice, and rectum                            were photographed. The colonoscopy was somewhat                            difficult due to a tortuous colon. Successful                            completion of the procedure was aided by COLOWRAP.                            The patient tolerated the procedure well. The                            quality of the bowel preparation was excellent. Scope In: 11:24:27 AM Scope Out: 11:38:22 AM Scope Withdrawal Time: 0 hours 10 minutes 15 seconds  Total Procedure Duration: 0 hours 13 minutes 55 seconds  Findings:      The recto-sigmoid colon was moderately redundant.      Internal hemorrhoids were found. The hemorrhoids were large.      External hemorrhoids were found. The hemorrhoids were moderate.      The exam was otherwise without abnormality. Impression:               - Redundant LEFT colon.                           -  LARGE Internal hemorrhoids.                           - MODERATE External hemorrhoids. Moderate Sedation:      Per Anesthesia Care Recommendation:           - High fiber diet.                           - Continue present medications.                           - Repeat colonoscopy in 10 years for surveillance.                           - Patient has a contact number available for                            emergencies. The signs and symptoms of potential                            delayed complications were discussed with the                            patient. Return to normal activities tomorrow.                            Written discharge instructions were  provided to the                            patient. Procedure Code(s):        --- Professional ---                           [phone], Colonoscopy, flexible; diagnostic, including                            collection of specimen(s) by brushing or washing,                            when performed (separate procedure) Diagnosis Code(s):        --- Professional ---                           Z12.11, Encounter for screening for malignant                            neoplasm of colon                           K64.4, Residual hemorrhoidal skin tags                           K64.8, Other hemorrhoids                           Q43.8, Other specified congenital malformations of  intestine CPT copyright 2016 American Medical Association. All rights reserved. The codes documented in this report are preliminary and upon coder review may  be revised to meet current compliance requirements. Jonette EvaSandi Aneka Fagerstrom, MD Jonette EvaSandi Detrick Dani, MD 11/27/2015 11:51:17 AM This report has been signed electronically. Number of Addenda: 0

## 2015-11-27 NOTE — Anesthesia Preprocedure Evaluation (Signed)
Anesthesia Evaluation  Patient identified by MRN, date of birth, ID band Patient awake    Reviewed: Allergy & Precautions, NPO status , Patient's Chart, lab work & pertinent test results  Airway Mallampati: II  TM Distance: >3 FB Neck ROM: Full    Dental no notable dental hx. (+) Teeth Intact   Pulmonary neg pulmonary ROS,    Pulmonary exam normal breath sounds clear to auscultation       Cardiovascular hypertension (borderline - no  meds), Normal cardiovascular exam Rhythm:Regular Rate:Normal     Neuro/Psych negative neurological ROS  negative psych ROS   GI/Hepatic negative GI ROS,   Endo/Other    Renal/GU      Musculoskeletal  (+) Arthritis ,   Abdominal   Peds  Hematology   Anesthesia Other Findings   Reproductive/Obstetrics                             Anesthesia Physical Anesthesia Plan  ASA: II  Anesthesia Plan: MAC   Post-op Pain Management:    Induction: Intravenous  Airway Management Planned: Simple Face Mask  Additional Equipment:   Intra-op Plan:   Post-operative Plan:   Informed Consent: I have reviewed the patients History and Physical, chart, labs and discussed the procedure including the risks, benefits and alternatives for the proposed anesthesia with the patient or authorized representative who has indicated his/her understanding and acceptance.     Plan Discussed with:   Anesthesia Plan Comments:         Anesthesia Quick Evaluation

## 2015-11-27 NOTE — H&P (Signed)
Primary Care Physician:  Mechele ClaudeSTACKS,WARREN, MD Primary Gastroenterologist:  Dr. Darrick PennaFields  Pre-Procedure History & Physical: HPI:  Luke Davila is a 50 y.o. male here for COLON CANCER SCREENING.  Past Medical History:  Diagnosis Date  . Avascular necrosis of hip (HCC)    LEFT  . Enlarged prostate   . Glaucoma   . Gout   . History of gout     Past Surgical History:  Procedure Laterality Date  . FOOT SURGERY Left 1985  . Left hand surgery    . TOTAL HIP ARTHROPLASTY Left 04/09/2015   Procedure: LEFT TOTAL HIP ARTHROPLASTY ANTERIOR APPROACH;  Surgeon: Ollen GrossFrank Aluisio, MD;  Location: [facility];  Service: Orthopedics;  Laterality: Left;    Prior to Admission medications   Medication Sig Start Date End Date Taking? Authorizing Provider  lubiprostone (AMITIZA) 24 MCG capsule Take 1 capsule (24 mcg total) by mouth 2 (two) times daily with a meal. 10/29/15  Yes Anice PaganiniEric A Gill, NP  tamsulosin (FLOMAX) 0.4 MG CAPS capsule TAKE 2 CAPSULES (0.8 MG TOTAL) BY MOUTH DAILY AFTER SUPPER. 10/08/15  Yes Mechele ClaudeWarren Stacks, MD  Tetrahydrozoline HCl (VISINE OP) Apply 1 drop to eye 2 (two) times daily.   Yes Historical Provider, MD  tobramycin-dexamethasone [facility](TOBRADEX) ophthalmic solution Apply 1 drop in affected eye(s) every 2 hours for two days. Then every 4 hours for 5 days. 11/08/15  Yes Mechele ClaudeWarren Stacks, MD  traMADol (ULTRAM) 50 MG tablet Take 1-2 tablets (50-100 mg total) by mouth every 6 (six) hours as needed (mild pain). 06/12/15  Yes Mechele ClaudeWarren Stacks, MD  Vitamin D, Ergocalciferol, (DRISDOL) 50000 units CAPS capsule TAKE 1 CAPSULE (50,000 UNITS TOTAL) BY MOUTH 2 (TWO) TIMES A WEEK. 10/11/15  Yes Mechele ClaudeWarren Stacks, MD    Allergies as of 10/29/2015  . (No Known Allergies)    Family History  Problem Relation Age of Onset  . Hypertension Mother   . Colon cancer Neg Hx     Social History   Social History  . Marital status: Married    Spouse name: Ramona  . Number of children: 2  . Years of education: 12th    Occupational History  .  Administrator, [employer]   Social History Main Topics  . Smoking status: Never Smoker  . Smokeless tobacco: Never Used  . Alcohol use Yes     Comment: 3 beers daily  . Drug use: No  . Sexual activity: Yes    Birth control/ protection: None   Other Topics Concern  . Not on file   Social History Narrative   Patient lives at home with his family.   Caffeine Use: 1 soda daily   Patient is right handed    Review of Systems: See HPI, otherwise negative ROS   Physical Exam: BP (!) 148/99   Pulse 79   Temp 98.4 F (36.9 C) (Oral)   Resp 17   Ht 6\' 3"  (1.905 m)   Wt 250 lb (113.4 kg)   SpO2 100%   BMI 31.25 kg/m  General:   Alert,  pleasant and cooperative in NAD Head:  Normocephalic and atraumatic. Neck:  Supple; Lungs:  Clear throughout to auscultation.    Heart:  Regular rate and rhythm. Abdomen:  Soft, nontender and nondistended. Normal bowel sounds, without guarding, and without rebound.   Neurologic:  Alert and  oriented x4;  grossly normal neurologically.  Impression/Plan:     SCREENING  Plan:  1. TCS TODAY. DISCUSSED PROCEDURE, BENEFITS, & RISKS: <  1% chance of medication reaction, bleeding, perforation, or rupture of spleen/liver.

## 2015-12-03 ENCOUNTER — Encounter (HOSPITAL_COMMUNITY): Payer: Self-pay | Admitting: Gastroenterology

## 2015-12-03 NOTE — Progress Notes (Signed)
CC'D TO PCP °

## 2015-12-18 ENCOUNTER — Telehealth: Payer: Self-pay | Admitting: Gastroenterology

## 2015-12-18 NOTE — Telephone Encounter (Signed)
I called pt and explained he did not have polyps. His information was on his discharge summary and I went over that with him.

## 2015-12-18 NOTE — Telephone Encounter (Signed)
REVIEWED. AGREE. NO ADDITIONAL RECOMMENDATIONS. 

## 2015-12-18 NOTE — Telephone Encounter (Signed)
Pt called to say he had a colonoscopy 2 weeks ago and hasn't heard about his results. Please call [phone]

## 2015-12-25 ENCOUNTER — Ambulatory Visit: Payer: 59 | Admitting: Urology

## 2015-12-29 ENCOUNTER — Other Ambulatory Visit: Payer: Self-pay | Admitting: Family Medicine

## 2016-01-29 ENCOUNTER — Ambulatory Visit: Payer: 59 | Admitting: Nurse Practitioner

## 2016-02-15 ENCOUNTER — Encounter: Payer: Self-pay | Admitting: *Deleted

## 2016-02-18 ENCOUNTER — Ambulatory Visit (INDEPENDENT_AMBULATORY_CARE_PROVIDER_SITE_OTHER): Payer: 59 | Admitting: Family Medicine

## 2016-02-18 ENCOUNTER — Encounter: Payer: Self-pay | Admitting: Family Medicine

## 2016-02-18 VITALS — BP 131/82 | HR 81 | Temp 98.5°F | Ht 75.0 in | Wt 247.0 lb

## 2016-02-18 DIAGNOSIS — L280 Lichen simplex chronicus: Secondary | ICD-10-CM

## 2016-02-18 DIAGNOSIS — E291 Testicular hypofunction: Secondary | ICD-10-CM

## 2016-02-18 DIAGNOSIS — G4701 Insomnia due to medical condition: Secondary | ICD-10-CM | POA: Diagnosis not present

## 2016-02-18 MED ORDER — TESTOSTERONE CYPIONATE 200 MG/ML IM SOLN: 150.0000 mg | INTRAMUSCULAR | 1 refills | Status: DC

## 2016-02-18 MED ORDER — HALOBETASOL PROPIONATE 0.05 % EX CREA: TOPICAL_CREAM | Freq: Two times a day (BID) | CUTANEOUS | 2 refills | Status: DC

## 2016-02-18 MED ORDER — TAMSULOSIN HCL 0.4 MG PO CAPS: 0.8000 mg | ORAL_CAPSULE | Freq: Every day | ORAL | 5 refills | Status: DC

## 2016-02-18 NOTE — Progress Notes (Signed)
Subjective:  Patient ID: Luke Davila, male    DOB: [birth year]  Age: 51 y.o. MRN: [id number]  CC: Rash (pt here today c/o rash on the bottom of both of his legs)   HPI Luke Davila presents for6 weeks of worsening Pruritic rash at both ankles. Some AT the heels. No relief with over-the-counter remedies itches so he scratches and just seems to be getting worse over several weeks. Patient also complains of lack of energy. He couldn't get his testosterone refilled. He says it was because office wouldn't refill it for him. He says he cannot sleep although he's tried everything over-the-counter for it. Mainly he has trouble getting to sleep and awakens multiple times during the night.   History Luke Davila has a past medical history of Avascular necrosis of hip (HCC); Enlarged prostate; Glaucoma; Gout; and History of gout.   He has a past surgical history that includes Foot surgery (Left, 1985); Left hand surgery; Total hip arthroplasty (Left, 04/09/2015); and Colonoscopy with propofol (N/A, 11/27/2015).   His family history includes Hypertension in his mother.He reports that he has never smoked. He has never used smokeless tobacco. He reports that he drinks alcohol. He reports that he does not use drugs.    ROS Review of Systems  Constitutional: Positive for fatigue. Negative for chills, diaphoresis, fever and unexpected weight change.  HENT: Negative for congestion, hearing loss, rhinorrhea and sore throat.   Eyes: Negative for visual disturbance.  Respiratory: Negative for cough and shortness of breath.   Cardiovascular: Negative for chest pain.  Gastrointestinal: Negative for abdominal pain, constipation and diarrhea.  Genitourinary: Negative for dysuria and flank pain.  Musculoskeletal: Negative for arthralgias and joint swelling.  Skin: Positive for rash.  Neurological: Negative for dizziness and headaches.  Psychiatric/Behavioral: Negative for dysphoric mood and sleep disturbance.     Objective:  BP 131/82   Pulse 81   Temp 98.5 F (36.9 C) (Oral)   Ht 6\' 3"  (1.905 m)   Wt 247 lb (112 kg)   BMI 30.87 kg/m   BP Readings from Last 3 Encounters:  02/18/16 131/82  11/27/15 139/85  11/21/15 (!) 164/96    Wt Readings from Last 3 Encounters:  02/18/16 247 lb (112 kg)  11/27/15 250 lb (113.4 kg)  11/21/15 250 lb (113.4 kg)     Physical Exam  Constitutional: He appears well-developed and well-nourished.  HENT:  Head: Normocephalic and atraumatic.  Right Ear: Tympanic membrane and external ear normal. No decreased hearing is noted.  Left Ear: Tympanic membrane and external ear normal. No decreased hearing is noted.  Mouth/Throat: No oropharyngeal exudate or posterior oropharyngeal erythema.  Eyes: Pupils are equal, round, and reactive to light.  Neck: Normal range of motion. Neck supple.  Cardiovascular: Normal rate and regular rhythm.   No murmur heard. Pulmonary/Chest: Breath sounds normal. No respiratory distress.  Abdominal: Soft. Bowel sounds are normal. He exhibits no mass. There is no tenderness.  Skin: Skin is warm and dry. Rash (there is a ring of thickened skin with thick scale surrounding the ankle bilaterally. This is a band that measures 5 cm. There is also some thinner scale at the heel of both lfeet) noted.  Vitals reviewed.   No results found.  Assessment & Plan:   Luke Davila was seen today for rash.  Diagnoses and all orders for this visit:  Lichen simplex chronicus  Hypogonadism in male  Insomnia due to medical condition  Other orders -     tamsulosin (FLOMAX) 0.4  MG CAPS capsule; Take 2 capsules (0.8 mg total) by mouth daily after supper. -     testosterone cypionate (DEPOTESTOSTERONE CYPIONATE) 200 MG/ML injection; Inject 0.75 mLs (150 mg total) into the muscle once a week. -     halobetasol (ULTRAVATE) 0.05 % cream; Apply topically 2 (two) times daily.   I have discontinued Luke Davila's Vitamin D (Ergocalciferol),  tobramycin-dexamethasone, and lubiprostone. I am also having him start on halobetasol. Additionally, I am having him maintain his Tetrahydrozoline HCl (VISINE OP), traMADol, XIIDRA, diclofenac, methocarbamol, tamsulosin, and testosterone cypionate.  Allergies as of 02/18/2016   No Known Allergies     Medication List       Accurate as of 02/18/16  9:30 PM. Always use your most recent med list.          diclofenac 75 MG EC tablet Commonly known as:  VOLTAREN   halobetasol 0.05 % cream Commonly known as:  ULTRAVATE Apply topically 2 (two) times daily.   methocarbamol 500 MG tablet Commonly known as:  ROBAXIN   tamsulosin 0.4 MG Caps capsule Commonly known as:  FLOMAX Take 2 capsules (0.8 mg total) by mouth daily after supper.   testosterone cypionate 200 MG/ML injection Commonly known as:  DEPOTESTOSTERONE CYPIONATE Inject 0.75 mLs (150 mg total) into the muscle once a week.   traMADol 50 MG tablet Commonly known as:  ULTRAM Take 1-2 tablets (50-100 mg total) by mouth every 6 (six) hours as needed (mild pain).   VISINE OP Apply 1 drop to eye 2 (two) times daily.   XIIDRA 5 % Soln Generic drug:  Lifitegrast USE 1 DROP INTO BOTH EYES TWICE A DAY        Follow-up: Return in about 6 weeks (around 03/31/2016).  Mechele ClaudeWarren Meriem Lemieux, M.D.

## 2016-02-22 ENCOUNTER — Ambulatory Visit: Payer: 59

## 2016-02-25 ENCOUNTER — Ambulatory Visit: Payer: 59

## 2016-02-26 ENCOUNTER — Ambulatory Visit (INDEPENDENT_AMBULATORY_CARE_PROVIDER_SITE_OTHER): Payer: 59 | Admitting: *Deleted

## 2016-02-26 DIAGNOSIS — E291 Testicular hypofunction: Secondary | ICD-10-CM | POA: Diagnosis not present

## 2016-02-26 MED ORDER — TESTOSTERONE CYPIONATE 200 MG/ML IM SOLN
150.0000 mg | INTRAMUSCULAR | Status: DC
  Administered 2016-02-26 – 2016-09-02 (×9): 150 mg via INTRAMUSCULAR

## 2016-02-26 NOTE — Progress Notes (Signed)
Pt given Testosterone inj Tolerated well 

## 2016-03-04 ENCOUNTER — Ambulatory Visit (INDEPENDENT_AMBULATORY_CARE_PROVIDER_SITE_OTHER): Payer: 59 | Admitting: *Deleted

## 2016-03-04 DIAGNOSIS — E349 Endocrine disorder, unspecified: Secondary | ICD-10-CM

## 2016-03-04 DIAGNOSIS — E291 Testicular hypofunction: Secondary | ICD-10-CM | POA: Diagnosis not present

## 2016-03-04 NOTE — Progress Notes (Signed)
Pt given Testosterone inj Tolerated well 

## 2016-03-13 ENCOUNTER — Ambulatory Visit: Payer: 59

## 2016-03-18 ENCOUNTER — Ambulatory Visit (INDEPENDENT_AMBULATORY_CARE_PROVIDER_SITE_OTHER): Payer: 59 | Admitting: *Deleted

## 2016-03-18 ENCOUNTER — Other Ambulatory Visit (INDEPENDENT_AMBULATORY_CARE_PROVIDER_SITE_OTHER): Payer: 59

## 2016-03-18 DIAGNOSIS — E291 Testicular hypofunction: Secondary | ICD-10-CM | POA: Diagnosis not present

## 2016-03-18 DIAGNOSIS — E349 Endocrine disorder, unspecified: Secondary | ICD-10-CM | POA: Diagnosis not present

## 2016-03-18 NOTE — Progress Notes (Signed)
Pt given Testosterone inj Tolerated well 

## 2016-03-19 LAB — TESTOSTERONE,FREE AND TOTAL
TESTOSTERONE FREE: 7.2 pg/mL (ref 7.2–24.0)
Testosterone: 388 ng/dL (ref 264–916)

## 2016-03-20 ENCOUNTER — Other Ambulatory Visit (INDEPENDENT_AMBULATORY_CARE_PROVIDER_SITE_OTHER): Payer: 59

## 2016-03-20 DIAGNOSIS — E291 Testicular hypofunction: Secondary | ICD-10-CM

## 2016-03-20 DIAGNOSIS — IMO0001 Reserved for inherently not codable concepts without codable children: Secondary | ICD-10-CM

## 2016-03-21 LAB — CMP14+EGFR
ALBUMIN: 4.4 g/dL (ref 3.5–5.5)
ALK PHOS: 133 IU/L — AB (ref 39–117)
ALT: 28 IU/L (ref 0–44)
AST: 38 IU/L (ref 0–40)
Albumin/Globulin Ratio: 1.6 (ref 1.2–2.2)
BUN / CREAT RATIO: 10 (ref 9–20)
BUN: 13 mg/dL (ref 6–24)
Bilirubin Total: 0.4 mg/dL (ref 0.0–1.2)
CO2: 26 mmol/L (ref 18–29)
CREATININE: 1.27 mg/dL (ref 0.76–1.27)
Calcium: 9.2 mg/dL (ref 8.7–10.2)
Chloride: 101 mmol/L (ref 96–106)
GFR calc Af Amer: 76 mL/min/{1.73_m2} (ref >59)
GFR calc non Af Amer: 65 mL/min/{1.73_m2} (ref >59)
GLUCOSE: 78 mg/dL (ref 65–99)
Globulin, Total: 2.8 g/dL (ref 1.5–4.5)
Potassium: 4.5 mmol/L (ref 3.5–5.2)
Sodium: 139 mmol/L (ref 134–144)
Total Protein: 7.2 g/dL (ref 6.0–8.5)

## 2016-03-21 LAB — CBC WITH DIFFERENTIAL/PLATELET
BASOS ABS: 0 10*3/uL (ref 0.0–0.2)
Basos: 1 %
EOS (ABSOLUTE): 0.4 10*3/uL (ref 0.0–0.4)
Eos: 9 %
Hematocrit: 33.2 % — ABNORMAL LOW (ref 37.5–51.0)
Hemoglobin: 11.1 g/dL — ABNORMAL LOW (ref 13.0–17.7)
Immature Grans (Abs): 0 10*3/uL (ref 0.0–0.1)
Immature Granulocytes: 0 %
LYMPHS ABS: 0.9 10*3/uL (ref 0.7–3.1)
LYMPHS: 22 %
MCH: 26.4 pg — ABNORMAL LOW (ref 26.6–33.0)
MCHC: 33.4 g/dL (ref 31.5–35.7)
MCV: 79 fL (ref 79–97)
MONOCYTES: 11 %
Monocytes Absolute: 0.4 10*3/uL (ref 0.1–0.9)
Neutrophils Absolute: 2.3 10*3/uL (ref 1.4–7.0)
Neutrophils: 57 %
Platelets: 210 10*3/uL (ref 150–379)
RBC: 4.2 x10E6/uL (ref 4.14–5.80)
RDW: 14.5 % (ref 12.3–15.4)
WBC: 4.1 10*3/uL (ref 3.4–10.8)

## 2016-03-21 LAB — TESTOSTERONE,FREE AND TOTAL
TESTOSTERONE FREE: 10.2 pg/mL (ref 7.2–24.0)
Testosterone: 851 ng/dL (ref 264–916)

## 2016-03-21 LAB — VITAMIN D 25 HYDROXY (VIT D DEFICIENCY, FRACTURES): Vit D, 25-Hydroxy: 21.6 ng/mL — ABNORMAL LOW (ref 30.0–100.0)

## 2016-03-25 ENCOUNTER — Ambulatory Visit (INDEPENDENT_AMBULATORY_CARE_PROVIDER_SITE_OTHER): Payer: 59 | Admitting: *Deleted

## 2016-03-25 DIAGNOSIS — E349 Endocrine disorder, unspecified: Secondary | ICD-10-CM

## 2016-03-25 DIAGNOSIS — E291 Testicular hypofunction: Secondary | ICD-10-CM

## 2016-03-25 NOTE — Progress Notes (Signed)
Pt given testosterone inj Tolerated well 

## 2016-03-26 ENCOUNTER — Telehealth: Payer: Self-pay | Admitting: Family Medicine

## 2016-03-26 ENCOUNTER — Other Ambulatory Visit: Payer: Self-pay | Admitting: Family Medicine

## 2016-03-26 DIAGNOSIS — E559 Vitamin D deficiency, unspecified: Secondary | ICD-10-CM

## 2016-03-26 MED ORDER — VITAMIN D (ERGOCALCIFEROL) 1.25 MG (50000 UNIT) PO CAPS: 50000.0000 [IU] | ORAL_CAPSULE | ORAL | 0 refills | Status: DC

## 2016-03-26 NOTE — Telephone Encounter (Signed)
Vitamin D script sent to [facility].   Message was left on patient voice mail.

## 2016-04-08 ENCOUNTER — Ambulatory Visit (INDEPENDENT_AMBULATORY_CARE_PROVIDER_SITE_OTHER): Payer: 59 | Admitting: *Deleted

## 2016-04-08 DIAGNOSIS — E349 Endocrine disorder, unspecified: Secondary | ICD-10-CM | POA: Diagnosis not present

## 2016-04-08 NOTE — Progress Notes (Signed)
Testosterone inj given Pt tolerated well 

## 2016-04-15 ENCOUNTER — Ambulatory Visit (INDEPENDENT_AMBULATORY_CARE_PROVIDER_SITE_OTHER): Payer: 59 | Admitting: *Deleted

## 2016-04-15 DIAGNOSIS — E349 Endocrine disorder, unspecified: Secondary | ICD-10-CM

## 2016-04-15 NOTE — Progress Notes (Signed)
Pt given testosterone inj Tolerated well 

## 2016-04-29 ENCOUNTER — Telehealth: Payer: Self-pay | Admitting: Family Medicine

## 2016-04-29 ENCOUNTER — Other Ambulatory Visit: Payer: Self-pay | Admitting: Family Medicine

## 2016-04-29 NOTE — Telephone Encounter (Signed)
done

## 2016-04-30 ENCOUNTER — Ambulatory Visit (INDEPENDENT_AMBULATORY_CARE_PROVIDER_SITE_OTHER): Payer: 59 | Admitting: *Deleted

## 2016-04-30 DIAGNOSIS — E291 Testicular hypofunction: Secondary | ICD-10-CM

## 2016-04-30 DIAGNOSIS — E349 Endocrine disorder, unspecified: Secondary | ICD-10-CM

## 2016-04-30 NOTE — Progress Notes (Signed)
Pt given Testosterone inj Tolerated well 

## 2016-05-08 ENCOUNTER — Ambulatory Visit: Payer: 59

## 2016-05-22 ENCOUNTER — Other Ambulatory Visit: Payer: Self-pay | Admitting: Family Medicine

## 2016-05-25 ENCOUNTER — Other Ambulatory Visit: Payer: Self-pay | Admitting: Family Medicine

## 2016-06-15 IMAGING — MR MR HIP*L* W/O CM
4 of 5 series · 15 of 40 positions shown · non-contrast
Comparison: 02/18/2015

CLINICAL DATA: Left hip pain for 2 months.  Avascular necrosis.

EXAM:
MR OF THE LEFT HIP WITHOUT CONTRAST
TECHNIQUE: Multiplanar, multisequence MR imaging was performed. No intravenous
contrast was administered.

[Series 3: T1 · coronal · 5.0mm · 0.56mm/px · 3 of 18 slices shown (1 of 2)]
[im 4/18]
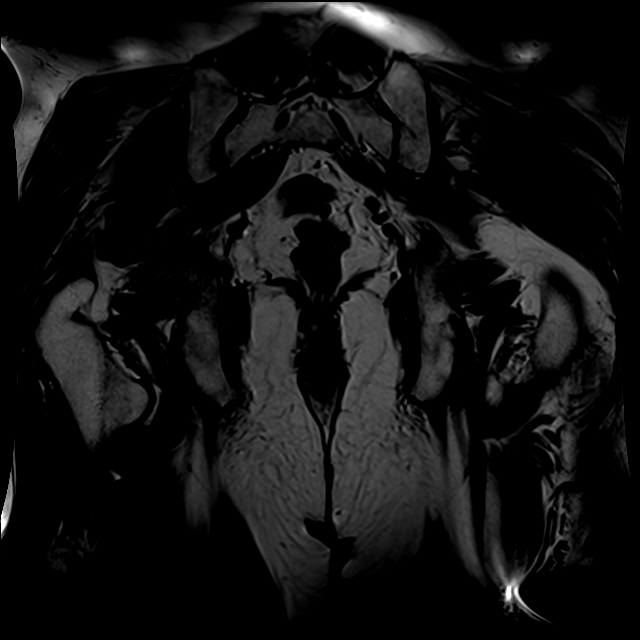
[im 11/18]
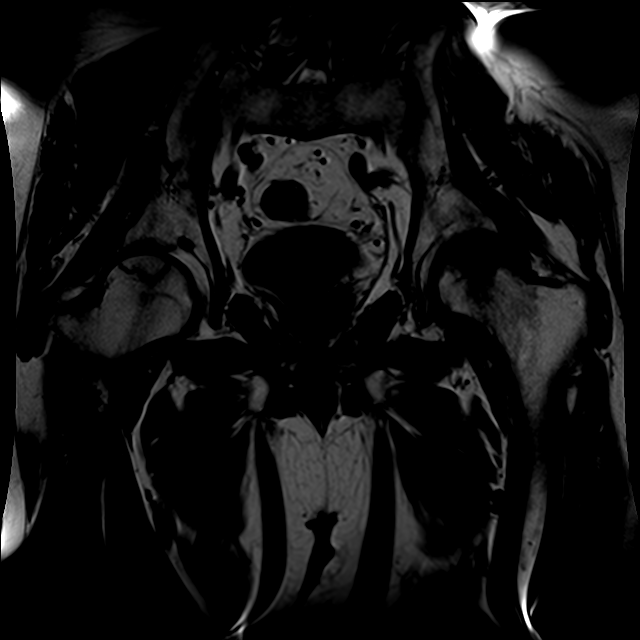
[im 18/18]
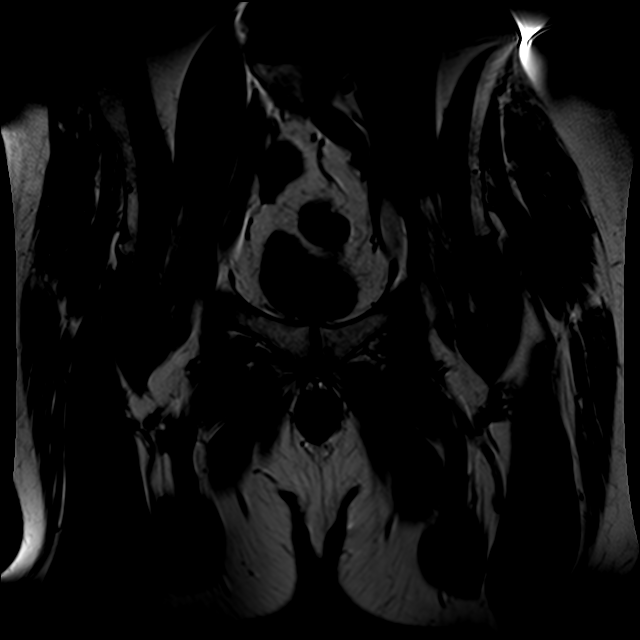

[Series 5: T2 fat-sat · axial · 4.0mm · 0.56mm/px · z∈[-28,+62]mm · 3 of 24 slices shown]
[im 3/24]
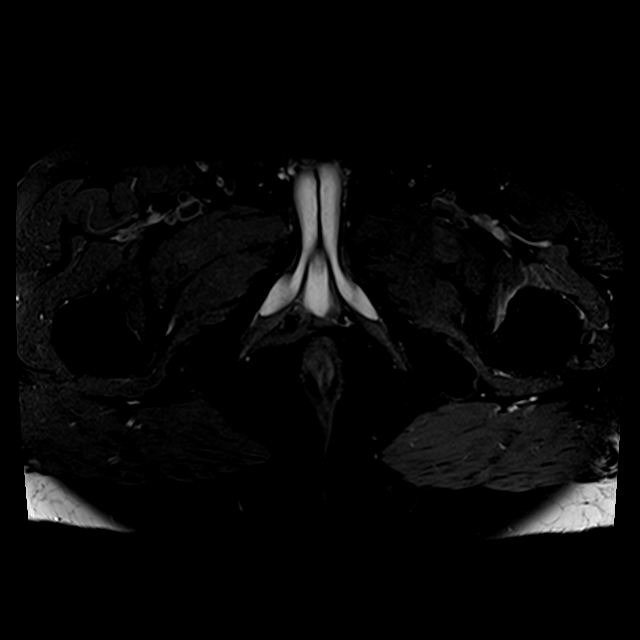
[im 12/24]
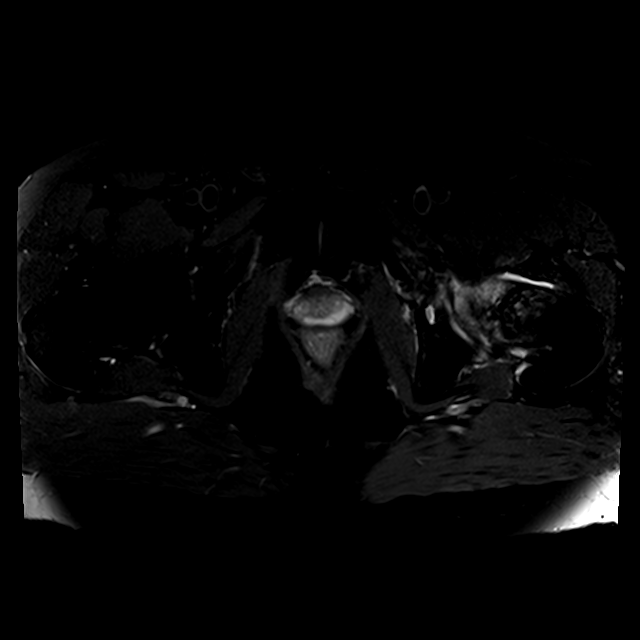
[im 21/24]
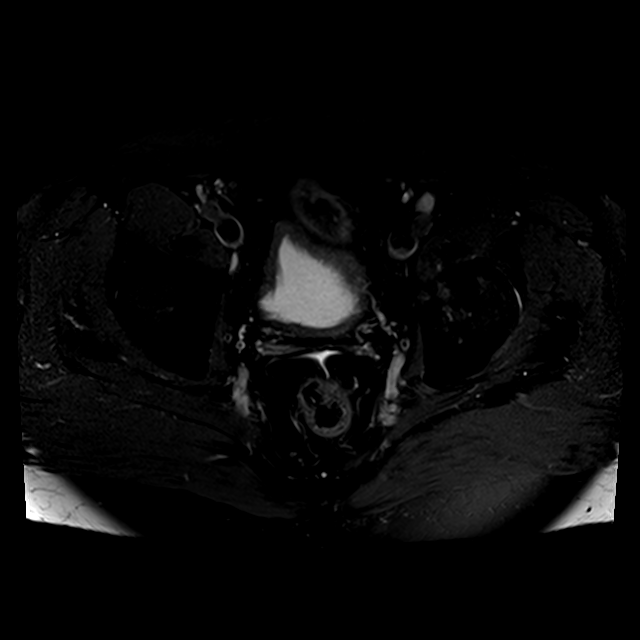

[Series 6: T1 · axial · 4.0mm · 0.56mm/px · z∈[-28,+62]mm · 3 of 24 slices shown (2 of 2)]
[im 3/24]
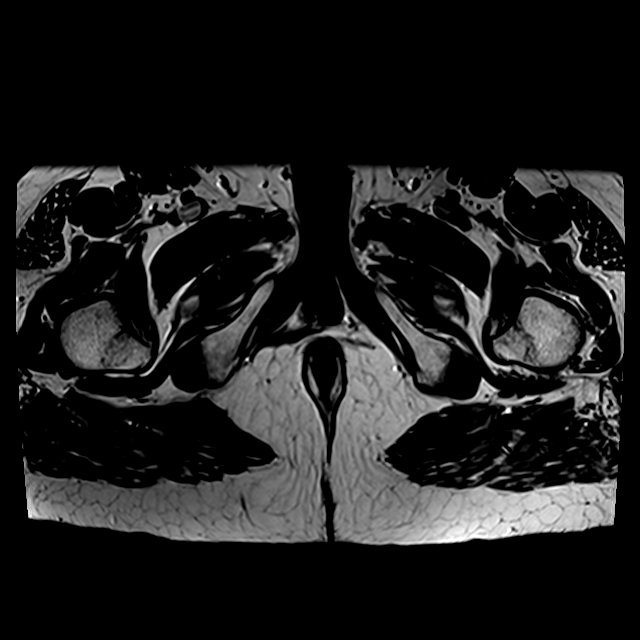
[im 12/24]
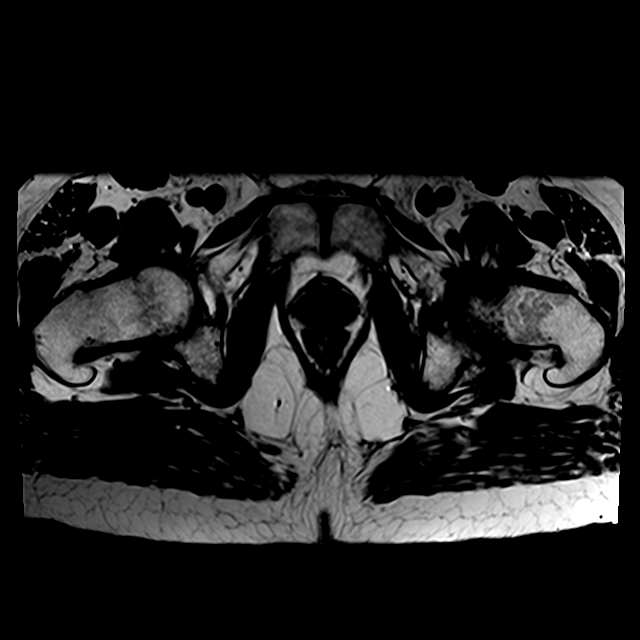
[im 21/24]
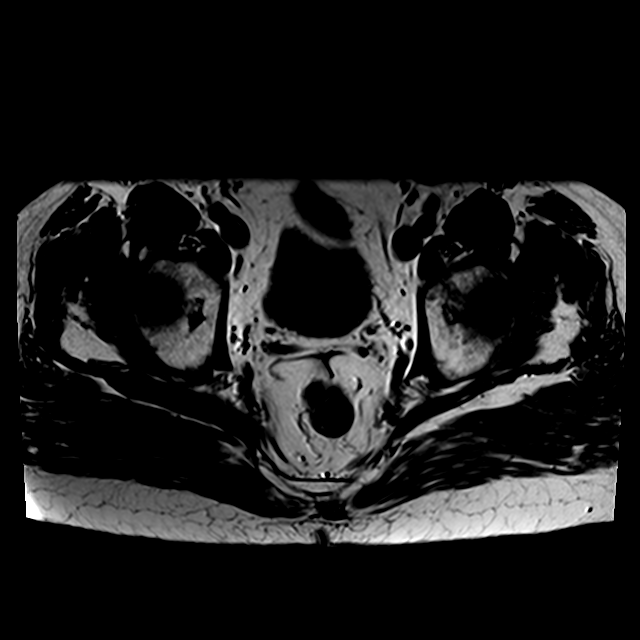

[Series 7: PD fat-sat · sagittal · 4.0mm · 0.35mm/px · 6 of 24 slices shown]
[im 1/24]
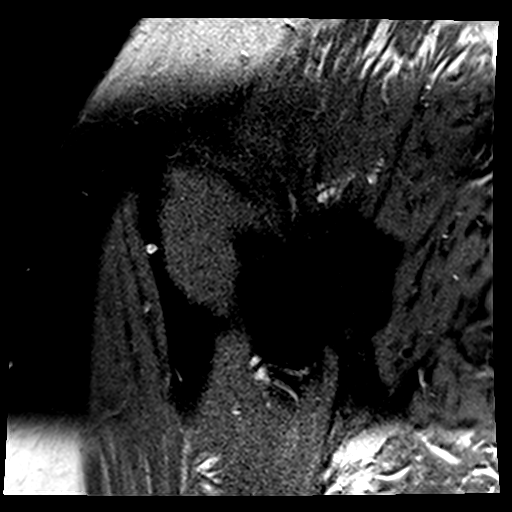
[im 3/24]
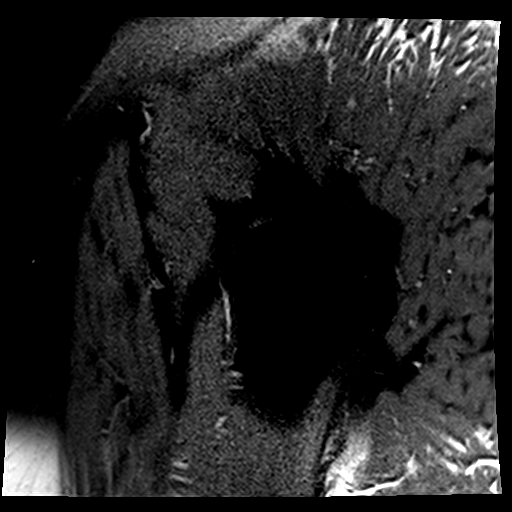
[im 6/24]
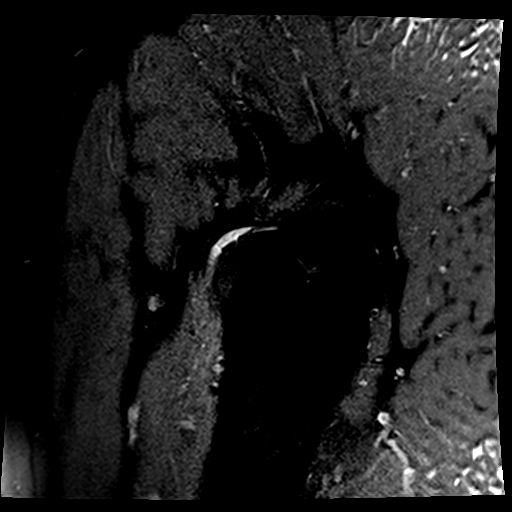
[im 9/24]
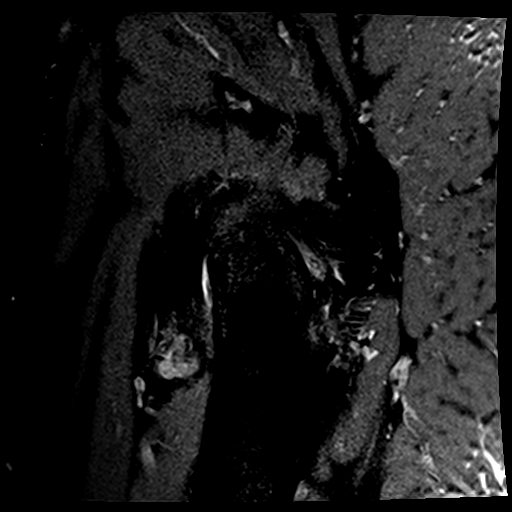
[im 12/24]
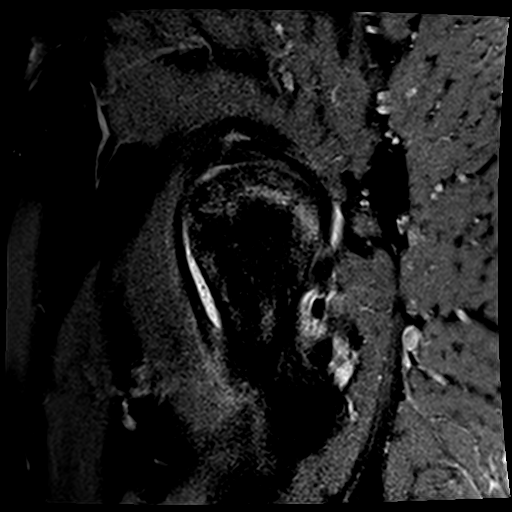
[im 21/24]
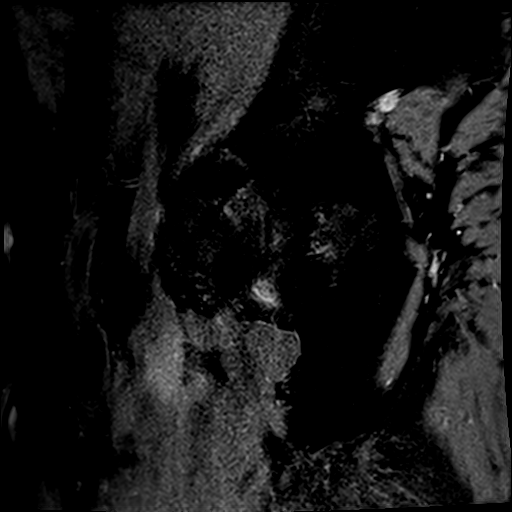

[15 of 40 positions shown; findings below may reference images not displayed]

FINDINGS: Bones: Bilateral hip avascular necrosis is present. On the right
side this appears chronic and re- vascularized. On the left, there
is active involvement and early subcortical collapse for example on
image 13 of series 3, and surrounding edema in the left femoral head
tracking into the femoral neck.

Articular cartilage and labrum

Articular cartilage: There is some mild asymmetric chondral thinning
in the left hip joint axially.

Labrum:  No gross labral tear or paralabral cyst seen.

Joint or bursal effusion

Joint effusion:  Small to moderate left hip joint effusion.

Bursae:  No regional bursitis.

Muscles and tendons

Muscles and tendons: There is low-level edema in the proximal left
vastus musculature and distal left iliopsoas, likely from secondary
inflammation. Low-level edema in the left hip adductor musculature.

Other findings

Miscellaneous: Urinary bladder unremarkable for degree of
distension. Trace free pelvic fluid, origin and significance
uncertain.
IMPRESSION: 1. Avascular necrosis of the femoral heads, chronic and re-
vascularized on the right but active on the left, with early
subcortical fracture/collapse in the involved portion of the left
femoral head, and surrounding marrow edema as well as a hip joint
effusion.
2. There is some likely secondary inflammation of adjacent muscles
including the hip adductor musculature, iliopsoas, and proximal
quadriceps musculature.
3. Trace free pelvic fluid, etiology and significance uncertain.

## 2016-07-23 ENCOUNTER — Telehealth: Payer: Self-pay | Admitting: Family Medicine

## 2016-07-23 NOTE — Telephone Encounter (Signed)
Pt notified of recommendation Will come in for lab

## 2016-07-23 NOTE — Telephone Encounter (Signed)
Please contact the patient See if he can drop by for testosterone level a few days before his appt.. (Before 10 AM)

## 2016-07-23 NOTE — Telephone Encounter (Signed)
Returned patient's phone call.  Last testosterone injection given 04/30/16.  Patient would like a refill and injection as soon as possible.  Appt made with Dr. Darlyn ReadStacks 07/25 at 1:55

## 2016-07-24 ENCOUNTER — Other Ambulatory Visit: Payer: 59

## 2016-07-24 DIAGNOSIS — E291 Testicular hypofunction: Secondary | ICD-10-CM | POA: Diagnosis not present

## 2016-07-25 LAB — CBC WITH DIFFERENTIAL/PLATELET
Basophils Absolute: 0 10*3/uL (ref 0.0–0.2)
Basos: 1 %
EOS (ABSOLUTE): 0.3 10*3/uL (ref 0.0–0.4)
Eos: 10 %
Hematocrit: 33.5 % — ABNORMAL LOW (ref 37.5–51.0)
Hemoglobin: 11.1 g/dL — ABNORMAL LOW (ref 13.0–17.7)
Immature Grans (Abs): 0 10*3/uL (ref 0.0–0.1)
Immature Granulocytes: 0 %
Lymphocytes Absolute: 0.9 10*3/uL (ref 0.7–3.1)
Lymphs: 28 %
MCH: 25.8 pg — ABNORMAL LOW (ref 26.6–33.0)
MCHC: 33.1 g/dL (ref 31.5–35.7)
MCV: 78 fL — ABNORMAL LOW (ref 79–97)
Monocytes Absolute: 0.6 10*3/uL (ref 0.1–0.9)
Monocytes: 17 %
Neutrophils Absolute: 1.5 10*3/uL (ref 1.4–7.0)
Neutrophils: 44 %
Platelets: 213 10*3/uL (ref 150–379)
RBC: 4.31 x10E6/uL (ref 4.14–5.80)
RDW: 14.1 % (ref 12.3–15.4)
WBC: 3.3 10*3/uL — ABNORMAL LOW (ref 3.4–10.8)

## 2016-07-25 LAB — CMP14+EGFR
ALK PHOS: 104 IU/L (ref 39–117)
ALT: 25 IU/L (ref 0–44)
AST: 45 IU/L — AB (ref 0–40)
Albumin/Globulin Ratio: 1.5 (ref 1.2–2.2)
Albumin: 4.2 g/dL (ref 3.5–5.5)
BILIRUBIN TOTAL: 0.8 mg/dL (ref 0.0–1.2)
BUN/Creatinine Ratio: 9 (ref 9–20)
BUN: 9 mg/dL (ref 6–24)
CHLORIDE: 87 mmol/L — AB (ref 96–106)
CO2: 23 mmol/L (ref 20–29)
Calcium: 8.6 mg/dL — ABNORMAL LOW (ref 8.7–10.2)
Creatinine, Ser: 0.98 mg/dL (ref 0.76–1.27)
GFR calc Af Amer: 103 mL/min/{1.73_m2} (ref >59)
GFR calc non Af Amer: 90 mL/min/{1.73_m2} (ref >59)
GLUCOSE: 73 mg/dL (ref 65–99)
Globulin, Total: 2.8 g/dL (ref 1.5–4.5)
Potassium: 4.5 mmol/L (ref 3.5–5.2)
Sodium: 122 mmol/L — ABNORMAL LOW (ref 134–144)
Total Protein: 7 g/dL (ref 6.0–8.5)

## 2016-07-25 LAB — TESTOSTERONE,FREE AND TOTAL
Testosterone, Free: 1.8 pg/mL — ABNORMAL LOW (ref 7.2–24.0)
Testosterone: 151 ng/dL — ABNORMAL LOW (ref 264–916)

## 2016-07-29 DIAGNOSIS — H47013 Ischemic optic neuropathy, bilateral: Secondary | ICD-10-CM | POA: Diagnosis not present

## 2016-07-30 ENCOUNTER — Telehealth: Payer: Self-pay | Admitting: Family Medicine

## 2016-07-30 ENCOUNTER — Ambulatory Visit: Payer: 59 | Admitting: Family Medicine

## 2016-07-30 NOTE — Telephone Encounter (Signed)
Left message, Doctor wants to discuss at next week appointment.

## 2016-07-30 NOTE — Telephone Encounter (Signed)
Patient has an appointment for next week. My plan was to discuss the results with him when he comes in.

## 2016-07-30 NOTE — Telephone Encounter (Signed)
Looks like you reviewed but don't see any notes. Can you review again? Patient is wanting lab results

## 2016-07-31 ENCOUNTER — Encounter: Payer: Self-pay | Admitting: Family Medicine

## 2016-08-05 ENCOUNTER — Ambulatory Visit (INDEPENDENT_AMBULATORY_CARE_PROVIDER_SITE_OTHER): Payer: 59 | Admitting: Family Medicine

## 2016-08-05 ENCOUNTER — Encounter: Payer: Self-pay | Admitting: Family Medicine

## 2016-08-05 VITALS — BP 125/81 | HR 73 | Temp 98.3°F | Ht 75.0 in | Wt 228.0 lb

## 2016-08-05 DIAGNOSIS — E291 Testicular hypofunction: Secondary | ICD-10-CM

## 2016-08-05 DIAGNOSIS — E871 Hypo-osmolality and hyponatremia: Secondary | ICD-10-CM

## 2016-08-05 MED ORDER — TESTOSTERONE CYPIONATE 200 MG/ML IM SOLN: 150.0000 mg | INTRAMUSCULAR | 1 refills | Status: DC

## 2016-08-05 MED ORDER — TRAZODONE HCL 150 MG PO TABS: ORAL_TABLET | ORAL | 5 refills | Status: DC

## 2016-08-05 NOTE — Progress Notes (Signed)
Subjective:  Patient ID: Luke Davila, male    DOB: [birth year]  Age: 51 y.o. MRN: [id number]  CC: Hypogonadism (pt here today to discuss Testosterone)   HPI Luke Davila presents forFollow-up of testosterone hasn't taken a shot in 6-8 weeks.Denies side effects. He just got busy and didn't come in for his blood work so he quit taking the medicine. Since then his energy has diminished significantly.   He went on a diet with Hydroxycut. He has been walking 5 miles at a time at [employer] on the treadmill. He's been on a weight loss challenge at work. Weight loss has been 20 pounds over the last month. Energy level is been very poor. He is falling asleep at work. But not sleeping well at night.   Depression screen [facility] 2/9 08/05/2016 02/18/2016 11/08/2015  Decreased Interest 0 0 0  Down, Depressed, Hopeless 0 0 0  PHQ - 2 Score 0 0 0    History Luke Davila has a past medical history of Avascular necrosis of hip ([place]); Enlarged prostate; Glaucoma; Gout; and History of gout.   He has a past surgical history that includes Foot surgery (Left, 1985); Left hand surgery; Total hip arthroplasty (Left, 04/09/2015); and Colonoscopy with propofol (N/A, 11/27/2015).   His family history includes Hypertension in his mother.He reports that he has never smoked. He has never used smokeless tobacco. He reports that he drinks alcohol. He reports that he does not use drugs.    ROS Review of Systems  Constitutional: Positive for fatigue. Negative for chills, diaphoresis, fever and unexpected weight change.  HENT: Negative for congestion, hearing loss, rhinorrhea and sore throat.   Eyes: Negative for visual disturbance.  Respiratory: Negative for cough and shortness of breath.   Cardiovascular: Negative for chest pain.  Gastrointestinal: Negative for abdominal pain, constipation and diarrhea.  Genitourinary: Negative for dysuria and flank pain.  Musculoskeletal: Negative for arthralgias and joint swelling.    Skin: Negative for rash.  Neurological: Negative for dizziness and headaches.  Psychiatric/Behavioral: Negative for dysphoric mood and sleep disturbance.    Objective:  BP 125/81   Pulse 73   Temp 98.3 F (36.8 C) (Oral)   Ht '6\' 3"'  (1.905 m)   Wt 228 lb (103.4 kg)   BMI 28.50 kg/m   BP Readings from Last 3 Encounters:  08/05/16 125/81  02/18/16 131/82  11/27/15 139/85    Wt Readings from Last 3 Encounters:  08/05/16 228 lb (103.4 kg)  02/18/16 247 lb (112 kg)  11/27/15 250 lb (113.4 kg)     Physical Exam  Constitutional: He is oriented to person, place, and time. He appears well-developed and well-nourished. No distress.  HENT:  Head: Normocephalic and atraumatic.  Right Ear: External ear normal.  Left Ear: External ear normal.  Nose: Nose normal.  Mouth/Throat: Oropharynx is clear and moist.  Eyes: Pupils are equal, round, and reactive to light. Conjunctivae and EOM are normal.  Neck: Normal range of motion. Neck supple. No thyromegaly present.  Cardiovascular: Normal rate, regular rhythm and normal heart sounds.   No murmur heard. Pulmonary/Chest: Effort normal and breath sounds normal. No respiratory distress. He has no wheezes. He has no rales.  Abdominal: Soft. Bowel sounds are normal. He exhibits no distension. There is no tenderness.  Lymphadenopathy:    He has no cervical adenopathy.  Neurological: He is alert and oriented to person, place, and time. He has normal reflexes.  Skin: Skin is warm and dry.  Psychiatric: He has a  normal mood and affect. His behavior is normal. Judgment and thought content normal.      Assessment & Plan:   Luke Davila was seen today for hypogonadism.  Diagnoses and all orders for this visit:  Hyponatremia -     BMP8+EGFR -     Osmolality  Hypogonadism in male  Other orders -     testosterone cypionate (DEPOTESTOSTERONE CYPIONATE) 200 MG/ML injection; Inject 0.75 mLs (150 mg total) into the muscle once a week. -      traZODone (DESYREL) 150 MG tablet; Use from 1/3 to 1 tablet nightly as needed for sleep.       I have discontinued Mr. Crumble Tetrahydrozoline HCl (VISINE OP), traMADol, XIIDRA, diclofenac, methocarbamol, halobetasol, Vitamin D (Ergocalciferol), tobramycin-dexamethasone, and Vitamin D (Ergocalciferol). I am also having him start on traZODone. Additionally, I am having him maintain his tamsulosin and testosterone cypionate. We will continue to administer testosterone cypionate.  Allergies as of 08/05/2016   No Known Allergies     Medication List       Accurate as of 08/05/16  5:00 PM. Always use your most recent med list.          tamsulosin 0.4 MG Caps capsule Commonly known as:  FLOMAX TAKE 2 CAPSULES (0.8 MG TOTAL) BY MOUTH DAILY AFTER SUPPER.   testosterone cypionate 200 MG/ML injection Commonly known as:  DEPOTESTOSTERONE CYPIONATE Inject 0.75 mLs (150 mg total) into the muscle once a week.   traZODone 150 MG tablet Commonly known as:  DESYREL Use from 1/3 to 1 tablet nightly as needed for sleep.        Follow-up: Return in about 2 months (around 10/05/2016).  Claretta Fraise, M.D.

## 2016-08-06 ENCOUNTER — Other Ambulatory Visit: Payer: Self-pay | Admitting: Family Medicine

## 2016-08-06 DIAGNOSIS — E871 Hypo-osmolality and hyponatremia: Secondary | ICD-10-CM | POA: Insufficient documentation

## 2016-08-06 LAB — BMP8+EGFR
BUN/Creatinine Ratio: 7 — ABNORMAL LOW (ref 9–20)
BUN: 7 mg/dL (ref 6–24)
CHLORIDE: 92 mmol/L — AB (ref 96–106)
CO2: 23 mmol/L (ref 20–29)
Calcium: 9.5 mg/dL (ref 8.7–10.2)
Creatinine, Ser: 1.04 mg/dL (ref 0.76–1.27)
GFR, EST AFRICAN AMERICAN: 96 mL/min/{1.73_m2} (ref >59)
GFR, EST NON AFRICAN AMERICAN: 83 mL/min/{1.73_m2} (ref >59)
Glucose: 77 mg/dL (ref 65–99)
POTASSIUM: 4.7 mmol/L (ref 3.5–5.2)
SODIUM: 130 mmol/L — AB (ref 134–144)

## 2016-08-06 LAB — OSMOLALITY: Osmolality Meas: 261 mOsmol/kg — ABNORMAL LOW (ref 275–295)

## 2016-08-10 ENCOUNTER — Other Ambulatory Visit: Payer: Self-pay | Admitting: Family Medicine

## 2016-08-12 ENCOUNTER — Ambulatory Visit (INDEPENDENT_AMBULATORY_CARE_PROVIDER_SITE_OTHER): Payer: 59 | Admitting: *Deleted

## 2016-08-12 DIAGNOSIS — E291 Testicular hypofunction: Secondary | ICD-10-CM

## 2016-08-12 DIAGNOSIS — E349 Endocrine disorder, unspecified: Secondary | ICD-10-CM

## 2016-08-12 LAB — SPECIMEN STATUS REPORT

## 2016-08-12 LAB — TSH: TSH: 2.92 u[IU]/mL (ref 0.450–4.500)

## 2016-08-12 LAB — ARGININE VASOPRESSIN HORMONE: Osmolality Meas: 269 mOsmol/kg — ABNORMAL LOW (ref 275–295)

## 2016-08-12 NOTE — Progress Notes (Signed)
Pt given Testosterone inj Tolerated well 

## 2016-08-18 ENCOUNTER — Other Ambulatory Visit: Payer: Self-pay | Admitting: *Deleted

## 2016-08-18 DIAGNOSIS — R899 Unspecified abnormal finding in specimens from other organs, systems and tissues: Secondary | ICD-10-CM

## 2016-08-19 ENCOUNTER — Ambulatory Visit (INDEPENDENT_AMBULATORY_CARE_PROVIDER_SITE_OTHER): Payer: 59 | Admitting: *Deleted

## 2016-08-19 DIAGNOSIS — E291 Testicular hypofunction: Secondary | ICD-10-CM

## 2016-08-19 DIAGNOSIS — R899 Unspecified abnormal finding in specimens from other organs, systems and tissues: Secondary | ICD-10-CM | POA: Diagnosis not present

## 2016-08-19 DIAGNOSIS — E349 Endocrine disorder, unspecified: Secondary | ICD-10-CM

## 2016-08-19 NOTE — Progress Notes (Signed)
Pt given Testosterone inj Tolerated well 

## 2016-08-22 LAB — ARGININE VASOPRESSIN HORMONE
ADH: <0.8 pg/mL (ref 0.0–4.7)
OSMOLALITY MEAS: 277 mosm/kg (ref 275–295)

## 2016-08-26 ENCOUNTER — Ambulatory Visit (INDEPENDENT_AMBULATORY_CARE_PROVIDER_SITE_OTHER): Payer: 59 | Admitting: *Deleted

## 2016-08-26 DIAGNOSIS — E291 Testicular hypofunction: Secondary | ICD-10-CM | POA: Diagnosis not present

## 2016-08-26 DIAGNOSIS — E349 Endocrine disorder, unspecified: Secondary | ICD-10-CM

## 2016-08-26 NOTE — Progress Notes (Signed)
Pt given Depotestosterone inj Tolerated well

## 2016-09-02 ENCOUNTER — Ambulatory Visit (INDEPENDENT_AMBULATORY_CARE_PROVIDER_SITE_OTHER): Payer: 59 | Admitting: *Deleted

## 2016-09-02 DIAGNOSIS — E349 Endocrine disorder, unspecified: Secondary | ICD-10-CM | POA: Diagnosis not present

## 2016-09-02 DIAGNOSIS — E291 Testicular hypofunction: Secondary | ICD-10-CM

## 2016-09-02 NOTE — Progress Notes (Signed)
Pt given Testosterone inj Tolerated well 

## 2016-09-16 ENCOUNTER — Ambulatory Visit (INDEPENDENT_AMBULATORY_CARE_PROVIDER_SITE_OTHER): Payer: 59 | Admitting: *Deleted

## 2016-09-16 ENCOUNTER — Other Ambulatory Visit: Payer: Self-pay | Admitting: Family Medicine

## 2016-09-16 DIAGNOSIS — E349 Endocrine disorder, unspecified: Secondary | ICD-10-CM | POA: Diagnosis not present

## 2016-09-16 MED ORDER — TESTOSTERONE CYPIONATE 200 MG/ML IM SOLN
200.0000 mg | INTRAMUSCULAR | Status: DC
  Administered 2016-09-16 – 2016-11-11 (×6): 200 mg via INTRAMUSCULAR

## 2016-09-16 MED ORDER — TESTOSTERONE CYPIONATE 200 MG/ML IM SOLN: 200.0000 mg | INTRAMUSCULAR | 1 refills | Status: DC

## 2016-09-16 NOTE — Progress Notes (Signed)
Testosterone injection given and tolerate well.  Increase to 200mg /mL weekly Per Dr. Darlyn ReadStacks

## 2016-09-23 ENCOUNTER — Ambulatory Visit (INDEPENDENT_AMBULATORY_CARE_PROVIDER_SITE_OTHER): Payer: 59 | Admitting: *Deleted

## 2016-09-23 DIAGNOSIS — E349 Endocrine disorder, unspecified: Secondary | ICD-10-CM | POA: Diagnosis not present

## 2016-09-23 NOTE — Progress Notes (Signed)
Pt given Testosterone inj Tolerated well 

## 2016-09-30 ENCOUNTER — Ambulatory Visit (INDEPENDENT_AMBULATORY_CARE_PROVIDER_SITE_OTHER): Payer: 59 | Admitting: *Deleted

## 2016-09-30 DIAGNOSIS — E349 Endocrine disorder, unspecified: Secondary | ICD-10-CM

## 2016-09-30 NOTE — Progress Notes (Signed)
Pt given Testosterone inj Tolerated well 

## 2016-10-07 ENCOUNTER — Ambulatory Visit (INDEPENDENT_AMBULATORY_CARE_PROVIDER_SITE_OTHER): Payer: 59 | Admitting: *Deleted

## 2016-10-07 DIAGNOSIS — E349 Endocrine disorder, unspecified: Secondary | ICD-10-CM

## 2016-10-07 NOTE — Progress Notes (Signed)
Pt given Testosterone inj Tolerated well 

## 2016-10-30 ENCOUNTER — Ambulatory Visit: Payer: 59

## 2016-11-04 ENCOUNTER — Ambulatory Visit (INDEPENDENT_AMBULATORY_CARE_PROVIDER_SITE_OTHER): Payer: 59 | Admitting: *Deleted

## 2016-11-04 DIAGNOSIS — E349 Endocrine disorder, unspecified: Secondary | ICD-10-CM | POA: Diagnosis not present

## 2016-11-04 NOTE — Progress Notes (Signed)
Pt given Testosterone inj Tolerated well 

## 2016-11-11 ENCOUNTER — Ambulatory Visit (INDEPENDENT_AMBULATORY_CARE_PROVIDER_SITE_OTHER): Payer: 59 | Admitting: *Deleted

## 2016-11-11 DIAGNOSIS — E349 Endocrine disorder, unspecified: Secondary | ICD-10-CM | POA: Diagnosis not present

## 2016-11-11 NOTE — Progress Notes (Signed)
Pt given Testosterone inj Tolerated well 

## 2016-11-26 ENCOUNTER — Other Ambulatory Visit: Payer: Self-pay | Admitting: Family Medicine

## 2016-12-21 ENCOUNTER — Other Ambulatory Visit: Payer: Self-pay | Admitting: Family Medicine

## 2017-01-14 ENCOUNTER — Other Ambulatory Visit: Payer: Self-pay | Admitting: Family Medicine

## 2017-01-20 ENCOUNTER — Encounter: Payer: Self-pay | Admitting: Family Medicine

## 2017-01-20 ENCOUNTER — Ambulatory Visit: Payer: 59 | Admitting: Family Medicine

## 2017-01-20 ENCOUNTER — Other Ambulatory Visit: Payer: Self-pay | Admitting: Family Medicine

## 2017-01-20 VITALS — BP 132/83 | HR 72 | Temp 97.2°F | Ht 75.0 in | Wt 248.0 lb

## 2017-01-20 DIAGNOSIS — H5789 Other specified disorders of eye and adnexa: Secondary | ICD-10-CM

## 2017-01-20 NOTE — Progress Notes (Signed)
Subjective: CC: ?eye infection PCP: Mechele ClaudeStacks, Warren, MD ZOX:WRUEAVHPI:Mehtaab Albesa SeenCentry is a 52 y.o. male presenting to clinic today for:  1. Eye concern Patient reports he developed a left red eye overnight.  He reports photophobia and some pain with eye movement to the left.  He denies ocular discharge, injury.  He is concerned that he had a foreign body and self irrigated in the shower this morning.  He notes some discomfort and irritation.  He reports feeling like his vision was blurred this morning.  He denies nausea, vomiting, headache, fevers, dizziness.  His past medical history significant for optic neuritis of the right eye with associated vision loss.  Denies history of glaucoma.  He [place] with [place] eye [place] in [place] and notes that he is due for an appointment.  [facility] right eye history.  He is never been told that he has any problems with the left eye in the past.   ROS: Per HPI  No Known Allergies Past Medical History:  Diagnosis Date  . Avascular necrosis of hip ([facility])    LEFT  . Enlarged prostate   . Glaucoma   . Gout   . History of gout     Current Outpatient Medications:  .  tamsulosin (FLOMAX) 0.4 MG CAPS capsule, TAKE 2 CAPSULES (0.8 MG TOTAL) BY MOUTH DAILY AFTER SUPPER., Disp: 30 capsule, Rfl: 5 .  tamsulosin (FLOMAX) 0.4 MG CAPS capsule, TAKE 2 CAPSULES (0.8 MG TOTAL) BY MOUTH DAILY AFTER SUPPER., Disp: 30 capsule, Rfl: 0 .  tamsulosin (FLOMAX) 0.4 MG CAPS capsule, TAKE 2 CAPSULES (0.8 MG TOTAL) BY MOUTH DAILY AFTER SUPPER., Disp: 30 capsule, Rfl: 0 .  tamsulosin (FLOMAX) 0.4 MG CAPS capsule, TAKE 2 CAPSULES (0.8 MG TOTAL) BY MOUTH DAILY AFTER SUPPER., Disp: 30 capsule, Rfl: 0 .  testosterone cypionate (DEPOTESTOSTERONE CYPIONATE) 200 MG/ML injection, Inject 1 mL (200 mg total) into the muscle once a week., Disp: 10 mL, Rfl: 1 .  traZODone (DESYREL) 150 MG tablet, Use from 1/3 to 1 tablet nightly as needed for sleep.,  Disp: 30 tablet, Rfl: 5  Current Facility-Administered Medications:  .  testosterone cypionate (DEPOTESTOSTERONE CYPIONATE) injection 200 mg, 200 mg, Intramuscular, Weekly, Stacks, Warren, MD, 200 mg at 11/11/16 1511 Social History   Socioeconomic History  . Marital status: Married    Spouse name: Ramona  . Number of children: 2  . Years of education: 12th  . Highest education level: Not on file  Social Needs  . Financial resource strain: Not on file  . Food insecurity - worry: Not on file  . Food insecurity - inability: Not on file  . Transportation needs - medical: Not on file  . Transportation needs - non-medical: Not on file  Occupational History    Employer: [employer]    Comment: [employer]  Tobacco Use  . Smoking status: Never Smoker  . Smokeless tobacco: Never Used  Substance and Sexual Activity  . Alcohol use: Yes    Comment: 3 beers daily  . Drug use: No  . Sexual activity: Yes    Birth control/protection: None  Other Topics Concern  . Not on file  Social History Narrative   Patient lives at home with his family.   Caffeine Use: 1 soda daily   Patient is right handed   Family History  Problem Relation Age of Onset  . Hypertension Mother   . Colon cancer Neg Hx  Objective: Office vital signs reviewed. BP 132/83   Pulse 72   Temp (!) 97.2 F (36.2 C) (Oral)   Ht 6\' 3"  (1.905 m)   Wt 248 lb (112.5 kg)   BMI 31.00 kg/m   Physical Examination:  General: Awake, alert, well nourished, nontoxic appearing, No acute distress HEENT: Normal    Neck: No masses palpated. No lymphadenopathy    Ears: Tympanic membranes intact, normal light reflex, no erythema, no bulging    Eyes: PERRLA, extraocular membranes intact.    Left eye with hyperemia.  No ocular discharge appreciated.  Photophobia appreciated.  See separate ocular examination below.  Visual testing: 20/20 in left eye.  Right eye not checked.  Fluorescein stain examination: 2 drops of  tetracaine ophthalmic solution applied to the left eye.  Fluorescein stain applied.  Left eye was examined.  No foreign bodies or abrasions were appreciated.      Assessment/ Plan: 52 y.o. male   1. Redness of left eye Does not appear consistent with infection.  Concern for neuritis versus glaucoma.  Patient does have a past medical history of optic neuritis in the right eye of unknown etiology.  I actually called his primary optometrist he was not in office today.  I was able to reach the ophthalmologist at [facility], who agreed to see patient at 8:00 tomorrow morning.  Home care instructions were reviewed with patient.  Strict return precautions and reasons for emergent evaluation in the emergency department were reviewed.  He voiced understanding.   Orders Placed This Encounter  Procedures  . Ambulatory referral to Ophthalmology    Referral Priority:   Routine    Referral Type:   Consultation    Referral Reason:   Specialty Services Required    Requested Specialty:   Ophthalmology    Number of Visits Requested:   1    Ashly Hulen Skains, DO [facility] [phone]

## 2017-01-20 NOTE — Patient Instructions (Signed)
I am concerned that you may have developed an optic neuritis in the left eye, this is what she had in the right eye.  I have contacted Dr. Sherryll BurgerShah in [place] with [facility] who has agreed to see you at 8:00 in the morning.  Their phone number is [phone] They are located [address], [place], [place] [address]  If you develop worsening vision, more severe pain in your left eye, fevers or headache, please seek immediate medical attention in emergency department.

## 2017-01-21 DIAGNOSIS — H209 Unspecified iridocyclitis: Secondary | ICD-10-CM | POA: Diagnosis not present

## 2017-01-31 ENCOUNTER — Other Ambulatory Visit: Payer: Self-pay | Admitting: Family Medicine

## 2017-03-03 ENCOUNTER — Ambulatory Visit: Payer: 59 | Admitting: Family Medicine

## 2017-03-03 ENCOUNTER — Encounter: Payer: Self-pay | Admitting: Family Medicine

## 2017-03-03 VITALS — BP 115/79 | HR 48 | Temp 97.5°F | Ht 75.0 in | Wt 243.0 lb

## 2017-03-03 DIAGNOSIS — N528 Other male erectile dysfunction: Secondary | ICD-10-CM | POA: Diagnosis not present

## 2017-03-03 DIAGNOSIS — E291 Testicular hypofunction: Secondary | ICD-10-CM

## 2017-03-03 MED ORDER — TESTOSTERONE 30 MG/ACT TD SOLN: TRANSDERMAL | 5 refills | Status: DC

## 2017-03-03 NOTE — Progress Notes (Signed)
Subjective:  Patient ID: Luke Davila, male    DOB: 1965-05-19  Age: 52 y.o. MRN: [id number]  CC: Follow-up (pt here today following up on his testosterone since he hasn't taken his testosterone injections in "4 months")   HPI Luke Davila presents for Follow up for testosterone deficiency: Pt. discontinued medicine 4 months ago.  He was not having any side effects.  He just got busy and could not come in for follow-up.. Denies any sx referrable to DVT such as edema or erythema of legs. No dyspnea or chest pain. Energy level reported as being better when he was taking the medicine. Libido is decreased and he needs help again with his erectile dysfunction.  Feels strength is adequate   Dep[facility] screen [facility] 2/9 03/03/2017 01/20/2017 08/05/2016  Decreased Interest 1 0 0  Down, Depressed, Hopeless 0 0 0  PHQ - 2 Score 1 0 0    History Luke Davila has a past medical history of Avascular necrosis of hip ([facility]), Enlarged prostate, Glaucoma, Gout, and History of gout.   He has a past surgical history that includes Foot surgery (Left, 1985); Left hand surgery; Total hip arthroplasty (Left, 04/09/2015); and Colonoscopy with propofol (N/A, 11/27/2015).   His family history includes Hypertension in his mother.He reports that  has never smoked. he has never used smokeless tobacco. He reports that he drinks alcohol. He reports that he does not use drugs.    ROS Review of Systems  Constitutional: Negative for chills, diaphoresis, fever and unexpected weight change.  HENT: Negative for congestion, hearing loss, rhinorrhea and sore throat.   Eyes: Negative for visual disturbance.  Respiratory: Negative for cough and shortness of breath.   Cardiovascular: Negative for chest pain.  Gastrointestinal: Negative for abdominal pain, constipation and diarrhea.  Genitourinary: Negative for dysuria and flank pain.  Musculoskeletal: Negative for arthralgias and joint swelling.  Skin: Negative for rash.    Neurological: Negative for dizziness and headaches.  Psychiatric/Behavioral: Negative for dysphoric mood and sleep disturbance.    Objective:  BP 115/79   Pulse (!) 48   Temp (!) 97.5 F (36.4 C) (Oral)   Ht 6\' 3"  (1.905 m)   Wt 243 lb (110.2 kg)   BMI 30.37 kg/m   BP Readings from Last 3 Encounters:  03/03/17 115/79  01/20/17 132/83  08/05/16 125/81    Wt Readings from Last 3 Encounters:  03/03/17 243 lb (110.2 kg)  01/20/17 248 lb (112.5 kg)  08/05/16 228 lb (103.4 kg)     Physical Exam  Constitutional: He is oriented to person, place, and time. He appears well-developed and well-nourished. No distress.  HENT:  Head: Normocephalic and atraumatic.  Right Ear: External ear normal.  Left Ear: External ear normal.  Nose: Nose normal.  Mouth/Throat: Oropharynx is clear and moist.  Eyes: Conjunctivae and EOM are normal. Pupils are equal, round, and reactive to light.  Neck: Normal range of motion. Neck supple. No thyromegaly present.  Cardiovascular: Normal rate, regular rhythm and normal heart sounds.  No murmur heard. Pulmonary/Chest: Effort normal and breath sounds normal. No respiratory distress. He has no wheezes. He has no rales.  Abdominal: Soft. Bowel sounds are normal. He exhibits no distension. There is no tenderness.  Lymphadenopathy:    He has no cervical adenopathy.  Neurological: He is alert and oriented to person, place, and time. He has normal reflexes.  Skin: Skin is warm and dry.  Psychiatric: He has a normal mood and affect. His behavior is normal. Judgment  and thought content normal.      Assessment & Plan:   Karry was seen today for follow-up.  Diagnoses and all orders for this visit:  Hypogonadism in male -     Testosterone,Free and Total  Other male erectile dysfunction  Other orders -     Testosterone 30 MG/ACT SOLN; 1 Applicator-ful under each arm daily       I am having Luke Rose start on Testosterone. I am also having  him maintain his testosterone cypionate and tamsulosin. We will continue to administer testosterone cypionate.  Allergies as of 03/03/2017   No Known Allergies     Medication List        Accurate as of 03/03/17 11:59 PM. Always use your most recent med list.          tamsulosin 0.4 MG Caps capsule Commonly known as:  FLOMAX TAKE 2 CAPSULES (0.8 MG TOTAL) BY MOUTH DAILY AFTER SUPPER.   Testosterone 30 MG/ACT Soln 1 Applicator-ful under each arm daily   testosterone cypionate 200 MG/ML injection Commonly known as:  DEPOTESTOSTERONE CYPIONATE Inject 1 mL (200 mg total) into the muscle once a week.        Follow-up: Return in about 6 months (around 08/31/2017).  Mechele Claude, M.D.

## 2017-03-03 NOTE — Patient Instructions (Signed)
Testosterone Replacement Therapy Testosterone replacement therapy (TRT) is used to treat men who have a low testosterone level (hypogonadism). Testosterone is a male hormone that is produced in the testicles. It is responsible for typically male characteristics and for maintaining a man's sex drive and the ability to get an erection. Testosterone also supports bone and muscle health. TRT can be a gel, liquid, or patch that you put on your skin. It can also be in the form of a tablet or an injection. In some cases, your health care provider may insert long-acting pellets under your skin. In most men, the level of testosterone starts to decline gradually after age 52. Low testosterone can also be caused by certain medical conditions, medicines, and obesity. Your health care provider can diagnose hypogonadism with at least two blood tests that are done early in the morning. Low testosterone may not need to be treated. TRT is usually a choice that you make with your health care provider. Your health care provider may recommend TRT if you have low testosterone that is causing symptoms, such as:  Low sex drive.  Erection problems.  Breast enlargement.  Loss of body hair.  Weak muscles or bones.  Shrinking testicles.  Increased body fat.  Low energy.  Hot flashes.  Depression.  Decreased work performance.  TRT is a lifetime treatment. If you stop treatment, your testosterone will drop, and your symptoms may return. What are the risks? Testosterone replacement therapy may have side effects, including:  Lower sperm count.  Skin irritation at the application or injection site.  Mouth irritation if you take an oral tablet.  Acne.  Swelling of your legs or feet.  Tender breasts.  Dizziness.  Sleep disturbance.  Mood swings.  Possible increased risk of stroke or heart attack.  Testosterone replacement therapy may also increase your risk for prostate cancer or male breast cancer.  You should not use TRT if you have either of those conditions. Your health care provider also may not recommend TRT if:  You are suspected of having prostate cancer.  You want to father a child.  You have a high number of red blood cells.  You have untreated sleep apnea.  You have a very large prostate.  Supplies needed:  Your health care provider will prescribe the testosterone gel, solution, or medicine that you need. If your health care provider teaches you to do self-injections at home, you will also need: ? Your medicine vial. ? Disposable needles and syringes. ? Alcohol swabs. ? A needle disposal container. ? Adhesive bandages. How to use testosterone replacement therapy Your health care provider will help you find the TRT option that will work best for you based on your preference, the side effects, and the cost. You may:  Rub testosterone gel on your upper arm or shoulder every day after a shower. This is the most common type of TRT. Do not let women or children come in contact with the gel.  Apply a testosterone solution under your arms once each day.  Place a testosterone patch on your skin once each day.  Dissolve a testosterone tablet in your mouth twice each day.  Have a testosterone pellet inserted under your skin by your health care provider. This will be replaced every 3-6 months.  Use testosterone nasal spray three times each day.  Get testosterone injections. For some types of testosterone, your health care provider will give you this injection. With other types of testosterone, you may be taught to give   injections to yourself. The frequency of injections may vary based on the type of testosterone that you receive.  Follow these instructions at home:  Take over-the-counter and prescription medicines only as told by your health care provider.  Lose weight if you are overweight. Ask your health care provider to help you start a healthy diet and exercise  program to reach and maintain a healthy weight.  Work with your health care provider to treat other medical conditions that may lower your testosterone. These include obesity, high blood pressure, high cholesterol, diabetes, liver disease, kidney disease, and sleep apnea.  Keep all follow-up visits as told by your health care provider. This is important. General recommendations  Discuss all risks and benefits with your health care provider before starting therapy.  Work with your health care provider to check your prostate health and do blood testing before you start therapy.  Do not use any testosterone replacement therapies that are not prescribed by your health care provider or not approved for use in the [place]  Do not use TRT for bodybuilding or to improve sexual performance. TRT should be used only to treat symptoms of low testosterone.  Return for all repeat prostate checks and blood tests during therapy, as told by your health care provider. Where to find more information: Learn more about testosterone replacement therapy from:  American Urological Foundation: www.urologyhealth.org/urologic-conditions/low-testosterone-(hypogonadism)  Endocrine Society: www.hormone.org/diseases-and-conditions/mens-health/hypogonadism  Contact a health care provider if:  You have side effects from your testosterone replacement therapy.  You continue to have symptoms of low testosterone during treatment.  You develop new symptoms during treatment. Summary  Testosterone replacement therapy is only for men who have low testosterone as determined by blood testing and who have symptoms of low testosterone.  Testosterone replacement therapy should be prescribed only by a health care provider and should be used under the supervision of a health care provider.  You may not be able to take testosterone if you have certain medical conditions, including prostate cancer, male breast cancer, or heart  disease.  Testosterone replacement therapy may have side effects and may make some medical conditions worse.  Talk with your health care provider about all the risks and benefits before you start therapy. This information is not intended to replace advice given to you by your health care provider. Make sure you discuss any questions you have with your health care provider. Document Released: 09/13/2015 Document Revised: 09/13/2015 Document Reviewed: 09/13/2015 Elsevier Interactive Patient Education  2018 [employer] Inc.  

## 2017-03-04 LAB — TESTOSTERONE,FREE AND TOTAL
TESTOSTERONE: 58 ng/dL — AB (ref 264–916)
Testosterone, Free: 1.6 pg/mL — ABNORMAL LOW (ref 7.2–24.0)

## 2017-03-09 ENCOUNTER — Ambulatory Visit: Payer: 59 | Admitting: Family Medicine

## 2017-03-11 ENCOUNTER — Encounter: Payer: Self-pay | Admitting: Family Medicine

## 2017-03-12 ENCOUNTER — Other Ambulatory Visit: Payer: 59

## 2017-03-12 DIAGNOSIS — R7989 Other specified abnormal findings of blood chemistry: Secondary | ICD-10-CM | POA: Diagnosis not present

## 2017-03-13 LAB — TESTOSTERONE,FREE AND TOTAL
TESTOSTERONE FREE: 3.9 pg/mL — AB (ref 7.2–24.0)
Testosterone: 308 ng/dL (ref 264–916)

## 2017-03-30 DIAGNOSIS — H30033 Focal chorioretinal inflammation, peripheral, bilateral: Secondary | ICD-10-CM | POA: Diagnosis not present

## 2017-03-30 DIAGNOSIS — H4720 Unspecified optic atrophy: Secondary | ICD-10-CM | POA: Diagnosis not present

## 2017-03-30 DIAGNOSIS — H04123 Dry eye syndrome of bilateral lacrimal glands: Secondary | ICD-10-CM | POA: Insufficient documentation

## 2017-03-30 DIAGNOSIS — D8689 Sarcoidosis of other sites: Secondary | ICD-10-CM | POA: Diagnosis not present

## 2017-04-02 DIAGNOSIS — H30033 Focal chorioretinal inflammation, peripheral, bilateral: Secondary | ICD-10-CM | POA: Insufficient documentation

## 2017-05-01 ENCOUNTER — Ambulatory Visit: Payer: 59 | Admitting: Family Medicine

## 2017-05-11 ENCOUNTER — Ambulatory Visit (INDEPENDENT_AMBULATORY_CARE_PROVIDER_SITE_OTHER): Payer: 59 | Admitting: Family Medicine

## 2017-05-11 ENCOUNTER — Encounter: Payer: Self-pay | Admitting: Family Medicine

## 2017-05-11 VITALS — BP 135/79 | HR 77 | Temp 97.2°F | Ht 75.0 in | Wt 258.5 lb

## 2017-05-11 DIAGNOSIS — E291 Testicular hypofunction: Secondary | ICD-10-CM

## 2017-05-11 DIAGNOSIS — R5383 Other fatigue: Secondary | ICD-10-CM | POA: Diagnosis not present

## 2017-05-11 DIAGNOSIS — R635 Abnormal weight gain: Secondary | ICD-10-CM

## 2017-05-11 DIAGNOSIS — Z13220 Encounter for screening for lipoid disorders: Secondary | ICD-10-CM

## 2017-05-11 NOTE — Patient Instructions (Signed)

## 2017-05-11 NOTE — Progress Notes (Signed)
Subjective:  Patient ID: Luke Davila, male    DOB: 10/02/1965  Age: 52 y.o. MRN: [id number]  CC: Follow-up (pt here today for routine follow up of his chronic medical conditions)   HPI Luke Davila presents for Follow up for testosterone deficiency: Pt. Using medication as directed. Denies any sx referrable to DVT such as edema or erythema of legs. No dyspnea or chest pain. Energy level reported as being decreased.  He is having more fatigue.  He also notes that his weight has increased.. Libido is normal and continues to have erectile dysfunction.  Feels strength is adequate and improved from baseline.  He is satisfied with the new medication Axiron.  He is using this instead of the testosterone injection from the past.   Depression screen [facility] 2/9 03/03/2017 01/20/2017 08/05/2016  Decreased Interest 1 0 0  Down, Depressed, Hopeless 0 0 0  PHQ - 2 Score 1 0 0    History Luke Davila has a past medical history of Avascular necrosis of hip ([place]), Enlarged prostate, Glaucoma, Gout, and History of gout.   He has a past surgical history that includes Foot surgery (Left, 1985); Left hand surgery; Total hip arthroplasty (Left, 04/09/2015); and Colonoscopy with propofol (N/A, 11/27/2015).   His family history includes Hypertension in his mother.He reports that he has never smoked. He has never used smokeless tobacco. He reports that he drinks alcohol. He reports that he does not use drugs.    ROS Review of Systems  Constitutional: Negative.   HENT: Negative.   Eyes: Negative for visual disturbance.  Respiratory: Negative for cough and shortness of breath.   Cardiovascular: Negative for chest pain and leg swelling.  Gastrointestinal: Negative for abdominal pain, diarrhea, nausea and vomiting.  Genitourinary: Negative for difficulty urinating.  Musculoskeletal: Positive for arthralgias (Hips have been stiff lately.  He bought a treadmill and is starting to work out with the treadmill to relieve  some of the stiffness.). Negative for myalgias.  Skin: Negative for rash.  Neurological: Negative for headaches.  Psychiatric/Behavioral: Negative for sleep disturbance.    Objective:  BP 135/79   Pulse 77   Temp (!) 97.2 F (36.2 C) (Oral)   Ht '6\' 3"'  (1.905 m)   Wt 258 lb 8 oz (117.3 kg)   BMI 32.31 kg/m   BP Readings from Last 3 Encounters:  05/11/17 135/79  03/03/17 115/79  01/20/17 132/83    Wt Readings from Last 3 Encounters:  05/11/17 258 lb 8 oz (117.3 kg)  03/03/17 243 lb (110.2 kg)  01/20/17 248 lb (112.5 kg)     Physical Exam  Constitutional: He is oriented to person, place, and time. He appears well-developed and well-nourished. No distress.  HENT:  Head: Normocephalic and atraumatic.  Right Ear: External ear normal.  Left Ear: External ear normal.  Nose: Nose normal.  Mouth/Throat: Oropharynx is clear and moist.  Eyes: Pupils are equal, round, and reactive to light. Conjunctivae and EOM are normal.  Neck: Normal range of motion. Neck supple. No thyromegaly present.  Cardiovascular: Normal rate, regular rhythm and normal heart sounds.  No murmur heard. Pulmonary/Chest: Effort normal and breath sounds normal. No respiratory distress. He has no wheezes. He has no rales.  Abdominal: Soft. Bowel sounds are normal. He exhibits no distension. There is no tenderness.  Lymphadenopathy:    He has no cervical adenopathy.  Neurological: He is alert and oriented to person, place, and time. He has normal reflexes.  Skin: Skin is warm and dry.  Psychiatric: He has a normal mood and affect. His behavior is normal. Judgment and thought content normal.      Assessment & Plan:   Luke Davila was seen today for follow-up.  Diagnoses and all orders for this visit:  Hypogonadism in male -     PSA, total and free -     Testosterone,Free and Total  Other fatigue -     TSH -     Bayer DCA Hb A1c Waived  Weight gain -     CBC with Differential/Platelet -      CMP14+EGFR -     Bayer DCA Hb A1c Waived  Screening for cholesterol level -     Lipid panel       I have discontinued Kree Cerrato's testosterone cypionate. I am also having him maintain his tamsulosin and Testosterone. We will stop administering testosterone cypionate.  Allergies as of 05/11/2017   No Known Allergies     Medication List        Accurate as of 05/11/17 12:06 PM. Always use your most recent med list.          tamsulosin 0.4 MG Caps capsule Commonly known as:  FLOMAX TAKE 2 CAPSULES (0.8 MG TOTAL) BY MOUTH DAILY AFTER SUPPER.   Testosterone 30 MG/ACT Soln 1 Applicator-ful under each arm daily        Follow-up: Return in about 2 months (around 07/11/2017) for Hypogonadism, energy.  Claretta Fraise, M.D.

## 2017-05-12 ENCOUNTER — Other Ambulatory Visit: Payer: 59

## 2017-05-12 DIAGNOSIS — Z13220 Encounter for screening for lipoid disorders: Secondary | ICD-10-CM | POA: Diagnosis not present

## 2017-05-12 DIAGNOSIS — R635 Abnormal weight gain: Secondary | ICD-10-CM | POA: Diagnosis not present

## 2017-05-12 DIAGNOSIS — E291 Testicular hypofunction: Secondary | ICD-10-CM | POA: Diagnosis not present

## 2017-05-12 DIAGNOSIS — R5383 Other fatigue: Secondary | ICD-10-CM | POA: Diagnosis not present

## 2017-05-12 LAB — BAYER DCA HB A1C WAIVED: HB A1C: 4.7 % (ref <7.0)

## 2017-05-13 ENCOUNTER — Other Ambulatory Visit: Payer: Self-pay | Admitting: Family Medicine

## 2017-05-13 DIAGNOSIS — E871 Hypo-osmolality and hyponatremia: Secondary | ICD-10-CM

## 2017-05-13 DIAGNOSIS — N289 Disorder of kidney and ureter, unspecified: Secondary | ICD-10-CM

## 2017-05-13 LAB — CMP14+EGFR
ALBUMIN: 4.7 g/dL (ref 3.5–5.5)
ALT: 25 IU/L (ref 0–44)
AST: 51 IU/L — ABNORMAL HIGH (ref 0–40)
Albumin/Globulin Ratio: 1.6 (ref 1.2–2.2)
Alkaline Phosphatase: 111 IU/L (ref 39–117)
BUN / CREAT RATIO: 8 — AB (ref 9–20)
BUN: 12 mg/dL (ref 6–24)
Bilirubin Total: 0.9 mg/dL (ref 0.0–1.2)
CALCIUM: 9 mg/dL (ref 8.7–10.2)
CO2: 25 mmol/L (ref 20–29)
CREATININE: 1.59 mg/dL — AB (ref 0.76–1.27)
Chloride: 92 mmol/L — ABNORMAL LOW (ref 96–106)
GFR, EST AFRICAN AMERICAN: 57 mL/min/{1.73_m2} — AB (ref >59)
GFR, EST NON AFRICAN AMERICAN: 50 mL/min/{1.73_m2} — AB (ref >59)
GLUCOSE: 84 mg/dL (ref 65–99)
Globulin, Total: 2.9 g/dL (ref 1.5–4.5)
Potassium: 4.2 mmol/L (ref 3.5–5.2)
Sodium: 132 mmol/L — ABNORMAL LOW (ref 134–144)
TOTAL PROTEIN: 7.6 g/dL (ref 6.0–8.5)

## 2017-05-13 LAB — PSA, TOTAL AND FREE
PROSTATE SPECIFIC AG, SERUM: 0.6 ng/mL (ref 0.0–4.0)
PSA, Free Pct: 23.3 %
PSA, Free: 0.14 ng/mL

## 2017-05-13 LAB — CBC WITH DIFFERENTIAL/PLATELET
BASOS ABS: 0 10*3/uL (ref 0.0–0.2)
Basos: 1 %
EOS (ABSOLUTE): 0.2 10*3/uL (ref 0.0–0.4)
EOS: 4 %
HEMOGLOBIN: 13.8 g/dL (ref 13.0–17.7)
Hematocrit: 41.1 % (ref 37.5–51.0)
IMMATURE GRANS (ABS): 0 10*3/uL (ref 0.0–0.1)
Immature Granulocytes: 0 %
Lymphocytes Absolute: 0.8 10*3/uL (ref 0.7–3.1)
Lymphs: 22 %
MCH: 26.6 pg (ref 26.6–33.0)
MCHC: 33.6 g/dL (ref 31.5–35.7)
MCV: 79 fL (ref 79–97)
MONOCYTES: 12 %
Monocytes Absolute: 0.5 10*3/uL (ref 0.1–0.9)
NEUTROS PCT: 61 %
Neutrophils Absolute: 2.3 10*3/uL (ref 1.4–7.0)
Platelets: 234 10*3/uL (ref 150–379)
RBC: 5.19 x10E6/uL (ref 4.14–5.80)
RDW: 14.7 % (ref 12.3–15.4)
WBC: 3.9 10*3/uL (ref 3.4–10.8)

## 2017-05-13 LAB — LIPID PANEL
Chol/HDL Ratio: 3.3 ratio (ref 0.0–5.0)
Cholesterol, Total: 226 mg/dL — ABNORMAL HIGH (ref 100–199)
HDL: 68 mg/dL (ref >39)
LDL CALC: 139 mg/dL — AB (ref 0–99)
Triglycerides: 96 mg/dL (ref 0–149)
VLDL CHOLESTEROL CAL: 19 mg/dL (ref 5–40)

## 2017-05-13 LAB — TESTOSTERONE,FREE AND TOTAL
Testosterone, Free: 2.3 pg/mL — ABNORMAL LOW (ref 7.2–24.0)
Testosterone: 88 ng/dL — ABNORMAL LOW (ref 264–916)

## 2017-05-13 LAB — TSH: TSH: 3.97 u[IU]/mL (ref 0.450–4.500)

## 2017-05-29 ENCOUNTER — Other Ambulatory Visit: Payer: Self-pay | Admitting: Family Medicine

## 2017-07-07 ENCOUNTER — Telehealth: Payer: Self-pay | Admitting: Family Medicine

## 2017-07-07 ENCOUNTER — Ambulatory Visit: Payer: 59

## 2017-07-07 NOTE — Telephone Encounter (Signed)
Patient aware that he has refills on testosterone at pharmacy

## 2017-07-14 ENCOUNTER — Ambulatory Visit: Payer: 59 | Admitting: Family Medicine

## 2017-07-14 ENCOUNTER — Encounter: Payer: Self-pay | Admitting: Family Medicine

## 2017-07-14 VITALS — BP 145/82 | HR 78 | Ht 75.0 in | Wt 256.5 lb

## 2017-07-14 DIAGNOSIS — E349 Endocrine disorder, unspecified: Secondary | ICD-10-CM

## 2017-07-14 DIAGNOSIS — Z23 Encounter for immunization: Secondary | ICD-10-CM | POA: Diagnosis not present

## 2017-07-14 NOTE — Patient Instructions (Signed)

## 2017-07-14 NOTE — Progress Notes (Signed)
Subjective:  Patient ID: Luke Davila, male    DOB: 11-03-1965  Age: 52 y.o. MRN: [id number]  CC: Medical Management of Chronic Issues   HPI Luke Davila presents for Follow up for testosterone deficiency: Pt. ran out of medication.  Bili is 6 weeks ago. Denies any sx referrable to DVT such as edema or erythema of legs. No dyspnea or chest pain. Energy level reported as being good. Libido is normal and denies E.D. Feels strength is adequate and improved from baseline.   patient also had a decline in renal function noted recently.  Is in today for retesting of kidney function.  He has some intermittent mild edema in the legs.  He denies dyspnea.  Depression screen [facility] 2/9 03/03/2017 01/20/2017 08/05/2016  Decreased Interest 1 0 0  Down, Depressed, Hopeless 0 0 0  PHQ - 2 Score 1 0 0    History Luke Davila has a past medical history of Avascular necrosis of hip ([place]), Enlarged prostate, Glaucoma, Gout, and History of gout.   He has a past surgical history that includes Foot surgery (Left, 1985); Left hand surgery; Total hip arthroplasty (Left, 04/09/2015); and Colonoscopy with propofol (N/A, 11/27/2015).   His family history includes Hypertension in his mother.He reports that he has never smoked. He has never used smokeless tobacco. He reports that he drinks alcohol. He reports that he does not use drugs.    ROS Review of Systems  Constitutional: Negative for fever.  Respiratory: Negative for shortness of breath.   Cardiovascular: Negative for chest pain.  Musculoskeletal: Negative for arthralgias.  Skin: Negative for rash.    Objective:  BP (!) 145/82   Pulse 78   Ht '6\' 3"'  (1.905 m)   Wt 256 lb 8 oz (116.3 kg)   BMI 32.06 kg/m   BP Readings from Last 3 Encounters:  07/14/17 (!) 145/82  05/11/17 135/79  03/03/17 115/79    Wt Readings from Last 3 Encounters:  07/14/17 256 lb 8 oz (116.3 kg)  05/11/17 258 lb 8 oz (117.3 kg)  03/03/17 243 lb (110.2 kg)     Physical Exam    Constitutional: He appears well-developed and well-nourished.  HENT:  Head: Normocephalic and atraumatic.  Right Ear: Tympanic membrane and external ear normal. No decreased hearing is noted.  Left Ear: Tympanic membrane and external ear normal. No decreased hearing is noted.  Mouth/Throat: No oropharyngeal exudate or posterior oropharyngeal erythema.  Eyes: Pupils are equal, round, and reactive to light.  Neck: Normal range of motion. Neck supple.  Cardiovascular: Normal rate and regular rhythm.  No murmur heard. Pulmonary/Chest: Breath sounds normal. No respiratory distress.  Abdominal: Soft. Bowel sounds are normal. He exhibits no mass. There is no tenderness.  Musculoskeletal: He exhibits edema (1+ both legs).  Vitals reviewed.     Assessment & Plan:   Luke Davila was seen today for medical management of chronic issues.  Diagnoses and all orders for this visit:  Testosterone deficiency -     CMP14+EGFR  Other orders -     Tdap vaccine greater than or equal to 7yo IM       I am having Luke Davila maintain his Testosterone and tamsulosin.  Allergies as of 07/14/2017   No Known Allergies     Medication List        Accurate as of 07/14/17 11:34 PM. Always use your most recent med list.          tamsulosin 0.4 MG Caps capsule Commonly known as:  FLOMAX TAKE 2 CAPSULES (0.8 MG TOTAL) BY MOUTH DAILY AFTER SUPPER.   Testosterone 30 MG/ACT Soln 1 Applicator-ful under each arm daily        Follow-up: Return in about 6 months (around 01/14/2018).  Claretta Fraise, M.D.

## 2017-07-15 LAB — CMP14+EGFR
ALT: 23 IU/L (ref 0–44)
AST: 40 IU/L (ref 0–40)
Albumin/Globulin Ratio: 1.4 (ref 1.2–2.2)
Albumin: 4.6 g/dL (ref 3.5–5.5)
Alkaline Phosphatase: 108 IU/L (ref 39–117)
BUN/Creatinine Ratio: 7 — ABNORMAL LOW (ref 9–20)
BUN: 9 mg/dL (ref 6–24)
Bilirubin Total: 0.6 mg/dL (ref 0.0–1.2)
CALCIUM: 9.7 mg/dL (ref 8.7–10.2)
CO2: 25 mmol/L (ref 20–29)
Chloride: 96 mmol/L (ref 96–106)
Creatinine, Ser: 1.26 mg/dL (ref 0.76–1.27)
GFR calc Af Amer: 76 mL/min/{1.73_m2} (ref >59)
GFR, EST NON AFRICAN AMERICAN: 66 mL/min/{1.73_m2} (ref >59)
GLOBULIN, TOTAL: 3.2 g/dL (ref 1.5–4.5)
Glucose: 90 mg/dL (ref 65–99)
Potassium: 4.7 mmol/L (ref 3.5–5.2)
Sodium: 135 mmol/L (ref 134–144)
TOTAL PROTEIN: 7.8 g/dL (ref 6.0–8.5)

## 2017-07-17 ENCOUNTER — Telehealth: Payer: Self-pay | Admitting: Family Medicine

## 2017-07-17 NOTE — Telephone Encounter (Signed)
Pt aware of results 

## 2017-09-11 ENCOUNTER — Other Ambulatory Visit: Payer: Self-pay | Admitting: Family Medicine

## 2017-11-07 DIAGNOSIS — R51 Headache: Secondary | ICD-10-CM | POA: Diagnosis not present

## 2017-11-07 DIAGNOSIS — B9789 Other viral agents as the cause of diseases classified elsewhere: Secondary | ICD-10-CM | POA: Diagnosis not present

## 2017-11-07 DIAGNOSIS — I10 Essential (primary) hypertension: Secondary | ICD-10-CM | POA: Diagnosis not present

## 2017-11-07 DIAGNOSIS — B349 Viral infection, unspecified: Secondary | ICD-10-CM | POA: Diagnosis not present

## 2017-11-07 DIAGNOSIS — M542 Cervicalgia: Secondary | ICD-10-CM | POA: Diagnosis not present

## 2017-11-07 DIAGNOSIS — H8309 Labyrinthitis, unspecified ear: Secondary | ICD-10-CM | POA: Diagnosis not present

## 2017-11-27 ENCOUNTER — Other Ambulatory Visit: Payer: Self-pay | Admitting: Family Medicine

## 2017-12-19 ENCOUNTER — Other Ambulatory Visit: Payer: Self-pay | Admitting: Family Medicine

## 2017-12-21 MED ORDER — TAMSULOSIN HCL 0.4 MG PO CAPS: 0.8000 mg | ORAL_CAPSULE | Freq: Every day | ORAL | 0 refills | Status: DC

## 2017-12-21 NOTE — Telephone Encounter (Signed)
Pt has f/u appt with Dr Darlyn ReadStacks 01/15/18, refill sent over for 30 days.

## 2017-12-21 NOTE — Addendum Note (Signed)
Addended by: Margurite AuerbachOMPTON, KARLA G on: 12/21/2017 10:47 AM   Modules accepted: Orders

## 2017-12-21 NOTE — Telephone Encounter (Signed)
Stacks. NTBS 30 days given 11/30/17

## 2018-01-02 ENCOUNTER — Other Ambulatory Visit: Payer: Self-pay | Admitting: Family Medicine

## 2018-01-04 NOTE — Telephone Encounter (Signed)
Last seen 07/14/17  Dr Stacks 

## 2018-01-15 ENCOUNTER — Ambulatory Visit: Payer: 59 | Admitting: Family Medicine

## 2018-01-22 ENCOUNTER — Other Ambulatory Visit: Payer: Self-pay | Admitting: Family Medicine

## 2018-01-25 ENCOUNTER — Ambulatory Visit: Payer: 59 | Admitting: Family Medicine

## 2018-01-25 ENCOUNTER — Encounter: Payer: Self-pay | Admitting: Family Medicine

## 2018-01-25 VITALS — BP 93/65 | HR 105 | Temp 97.0°F | Ht 75.0 in | Wt 261.1 lb

## 2018-01-25 DIAGNOSIS — I499 Cardiac arrhythmia, unspecified: Secondary | ICD-10-CM

## 2018-01-25 DIAGNOSIS — L309 Dermatitis, unspecified: Secondary | ICD-10-CM

## 2018-01-25 DIAGNOSIS — N528 Other male erectile dysfunction: Secondary | ICD-10-CM

## 2018-01-25 DIAGNOSIS — M109 Gout, unspecified: Secondary | ICD-10-CM

## 2018-01-25 DIAGNOSIS — E291 Testicular hypofunction: Secondary | ICD-10-CM

## 2018-01-25 DIAGNOSIS — Z13220 Encounter for screening for lipoid disorders: Secondary | ICD-10-CM

## 2018-01-25 MED ORDER — TESTOSTERONE 30 MG/ACT TD SOLN: TRANSDERMAL | 5 refills | Status: DC

## 2018-01-25 MED ORDER — TAMSULOSIN HCL 0.4 MG PO CAPS: 0.8000 mg | ORAL_CAPSULE | Freq: Every day | ORAL | 5 refills | Status: DC

## 2018-01-25 MED ORDER — BETAMETHASONE DIPROPIONATE AUG 0.05 % EX CREA: TOPICAL_CREAM | Freq: Two times a day (BID) | CUTANEOUS | 1 refills | Status: DC

## 2018-01-25 NOTE — Progress Notes (Signed)
Subjective:  Patient ID: Luke Davila, male    DOB: [birth year]  Age: 53 y.o. MRN: [id number]  CC: Medical Management of Chronic Issues   HPI Luke Davila presents for recheck of his testosterone.  Luke Davila has been unable to get the medicine since Luke Davila is was here last.  Through misunderstanding Luke Davila thought that I wanted him to discontinue it when in fact the recommendation had been for him to double his dose.  Apparently there has been some difficulty getting it approved through insurance as well.  Patient does have some BPH symptoms.  Those are under good control with his tamsulosin.  Luke Davila also did not receive his prescription for sildenafil.  Luke Davila is having erectile dysfunction issues.  Depression screen [facility] 2/9 03/03/2017 01/20/2017 08/05/2016  Decreased Interest 1 0 0  Down, Depressed, Hopeless 0 0 0  PHQ - 2 Score 1 0 0    History Luke Davila has a past medical history of Avascular necrosis of hip ([place]), Enlarged prostate, Glaucoma, Gout, and History of gout.   Luke Davila has a past surgical history that includes Foot surgery (Left, 1985); Left hand surgery; Total hip arthroplasty (Left, 04/09/2015); and Colonoscopy with propofol (N/A, 11/27/2015).   His family history includes Hypertension in his mother.Luke Davila reports that Luke Davila has never smoked. Luke Davila has never used smokeless tobacco. Luke Davila reports current alcohol use. Luke Davila reports that Luke Davila does not use drugs.    ROS Review of Systems  Constitutional: Negative.   HENT: Negative.   Eyes: Negative for visual disturbance.  Respiratory: Negative for cough and shortness of breath.   Cardiovascular: Negative for chest pain, palpitations and leg swelling.  Gastrointestinal: Negative for abdominal pain, diarrhea, nausea and vomiting.  Genitourinary: Negative for difficulty urinating.  Musculoskeletal: Negative for arthralgias and myalgias.  Skin: Positive for rash (scaly itching eruption chronic at both medial malleoli).  Neurological: Negative for headaches.    Psychiatric/Behavioral: Negative for sleep disturbance.    Objective:  BP 93/65   Pulse (!) 105   Temp (!) 97 F (36.1 C) (Oral)   Ht '6\' 3"'  (1.905 m)   Wt 261 lb 2 oz (118.4 kg)   BMI 32.64 kg/m   BP Readings from Last 3 Encounters:  01/25/18 93/65  07/14/17 (!) 145/82  05/11/17 135/79    Wt Readings from Last 3 Encounters:  01/25/18 261 lb 2 oz (118.4 kg)  07/14/17 256 lb 8 oz (116.3 kg)  05/11/17 258 lb 8 oz (117.3 kg)     Physical Exam Constitutional:      General: Luke Davila is not in acute distress.    Appearance: Luke Davila is well-developed.  HENT:     Head: Normocephalic and atraumatic.     Right Ear: External ear normal.     Left Ear: External ear normal.     Nose: Nose normal.  Eyes:     Conjunctiva/sclera: Conjunctivae normal.     Pupils: Pupils are equal, round, and reactive to light.  Neck:     Musculoskeletal: Normal range of motion and neck supple.  Cardiovascular:     Rate and Rhythm: Normal rate. Rhythm irregular.     Heart sounds: Normal heart sounds. No murmur.  Pulmonary:     Effort: Pulmonary effort is normal. No respiratory distress.     Breath sounds: Normal breath sounds. No wheezing or rales.  Abdominal:     Palpations: Abdomen is soft.     Tenderness: There is no abdominal tenderness. There is left CVA tenderness.  Musculoskeletal: Normal range  of motion.  Skin:    General: Skin is warm and dry.     Findings: Rash (dry scaly over medial malleoli, symmetric) present.  Neurological:     Mental Status: Luke Davila is alert and oriented to person, place, and time.     Deep Tendon Reflexes: Reflexes are normal and symmetric.  Psychiatric:        Behavior: Behavior normal.        Thought Content: Thought content normal.        Judgment: Judgment normal.    EKG:   Assessment & Plan:   Luke Davila was seen today for medical management of chronic issues.  Diagnoses and all orders for this visit:  Hypogonadism in male -     Testosterone,Free and  Total  Other male erectile dysfunction  Gout, unspecified cause, unspecified chronicity, unspecified site -     CBC with Differential/Platelet -     CMP14+EGFR  Irregular heart rhythm -     EKG 12-Lead -     24 hour holter monitor; Future -     Ambulatory referral to Cardiology  Eczema, unspecified type  Screening for cholesterol level -     Lipid panel  Other orders -     augmented betamethasone dipropionate (DIPROLENE-AF) 0.05 % cream; Apply topically 2 (two) times daily. At affected areas (avoid face and genitals) -     tamsulosin (FLOMAX) 0.4 MG CAPS capsule; Take 2 capsules (0.8 mg total) by mouth daily after supper. -     Discontinue: Testosterone 30 MG/ACT SOLN; 1 Applicator-ful under each arm daily -     Testosterone 30 MG/ACT SOLN; 1 Applicator-ful under each arm daily       I am having Luke Davila start on augmented betamethasone dipropionate. I am also having him maintain his tamsulosin and Testosterone.  Allergies as of 01/25/2018   No Known Allergies     Medication List       Accurate as of January 25, 2018  2:15 PM. Always use your most recent med list.        augmented betamethasone dipropionate 0.05 % cream Commonly known as:  DIPROLENE-AF Apply topically 2 (two) times daily. At affected areas (avoid face and genitals)   tamsulosin 0.4 MG Caps capsule Commonly known as:  FLOMAX Take 2 capsules (0.8 mg total) by mouth daily after supper.   Testosterone 30 MG/ACT Soln 1 Applicator-ful under each arm daily        Follow-up: Return in about 3 months (around 04/26/2018).  Claretta Fraise, M.D.

## 2018-01-25 NOTE — Addendum Note (Signed)
Addended by: Bearl Mulberry on: 01/25/2018 02:21 PM   Modules accepted: Orders

## 2018-02-01 ENCOUNTER — Ambulatory Visit: Payer: 59 | Admitting: Cardiology

## 2018-02-01 ENCOUNTER — Ambulatory Visit: Payer: Self-pay | Admitting: *Deleted

## 2018-02-01 NOTE — Chronic Care Management (AMB) (Signed)
  Care Management   Note  02/01/2018 Name: Luke Davila MRN: [id number] DOB: 05/01/1965  Luke Davila is a primary care patient of Dr Darlyn Read who had a 24 hour holter monitor placed at his PCP visit last week due to an irregular heart rhythm noted on physical exam. Lab orders and cardiology referral placed as well. Patient was setup for the last week in February. I discussed the results with Dr Darlyn Read and he requested a sooner cardiology appointment given the episodes of irregularity on the report. Attempts were made to coordinate with the patient's work schedule but unfortunately we were unable to do that within the timeframe requested for the visit. Luke Davila is scheduled for 02/03/2018 with Dr Allyson Sabal at the [facility] office. Results of the monitor were discussed and patient was provided instructions on what to do if he develops any symptoms. Patient is aware of the appointment date and time. A copy of the report was faxed to Dr Allyson Sabal for review since the original is not available. It is assumed that once it was signed by Dr Darlyn Read, it was sent to the scan center. It has not been scanned into the chart as of today.    Demetrios Loll, RN-BC, BSN Nurse Case Manager [facility] Ph: [phone]

## 2018-02-03 ENCOUNTER — Encounter: Payer: Self-pay | Admitting: Cardiovascular Disease

## 2018-02-03 ENCOUNTER — Ambulatory Visit: Payer: 59 | Admitting: Cardiovascular Disease

## 2018-02-03 DIAGNOSIS — I493 Ventricular premature depolarization: Secondary | ICD-10-CM

## 2018-02-03 DIAGNOSIS — E782 Mixed hyperlipidemia: Secondary | ICD-10-CM | POA: Diagnosis not present

## 2018-02-03 DIAGNOSIS — E785 Hyperlipidemia, unspecified: Secondary | ICD-10-CM | POA: Insufficient documentation

## 2018-02-03 NOTE — Assessment & Plan Note (Signed)
Profile performed 05/12/2017 revealed total cholesterol 226, LDL 139 and HDL 68.  He is not on a statin.  He does admit to dietary indiscretion.

## 2018-02-03 NOTE — Assessment & Plan Note (Signed)
Luke Davila was referred to me by Dr. Selina Cooley for asymptomatic PVCs.  He has no cardiac risk factors.  Does drink beers on his off days.  He is never had a heart attack or stroke.  Denies chest pain or shortness of breath.  He recently had a 24-hour Holter monitor that showed unifocal PVCs as well as couplets which he is unaware of.

## 2018-02-03 NOTE — Patient Instructions (Signed)
Medication Instructions:  NONE If you need a refill on your cardiac medications before your next appointment, please call your pharmacy.   Lab work: NONE If you have labs (blood work) drawn today and your tests are completely normal, you will receive your results only by: Marland Kitchen MyChart Message (if you have MyChart) OR . A paper copy in the mail If you have any lab test that is abnormal or we need to change your treatment, we will call you to review the results.  Testing/Procedures: Your physician has requested that you have an echocardiogram. Echocardiography is a painless test that uses sound waves to create images of your heart. It provides your doctor with information about the size and shape of your heart and how well your heart's chambers and valves are working. This procedure takes approximately one hour. There are no restrictions for this procedure.    Follow-Up: At [facility], you and your health needs are our priority.  As part of our continuing mission to provide you with exceptional heart care, we have created designated Provider Care Teams.  These Care Teams include your primary Cardiologist (physician) and Advanced Practice Providers (APPs -  Physician Assistants and Nurse Practitioners) who all work together to provide you with the care you need, when you need it. . You MAY SCHEDULE a follow up appointment AS NEEDED.  Please call our office 2 months in advance to schedule this appointment.  You may see Dr. Allyson Sabal or one of the following Advanced Practice Providers on your designated Care Team:   . Corine Shelter, [place] . Azalee Course, PA-C . Micah Flesher, PA-C . Joni Reining, DNP . Theodore Demark, PA-C . Judy Pimple, PA-C . Marjie Skiff, PA-C

## 2018-02-03 NOTE — Addendum Note (Signed)
Addended by: Harlow Asa on: 02/03/2018 11:11 AM   Modules accepted: Orders

## 2018-02-03 NOTE — Progress Notes (Signed)
02/03/2018 Luke [place]uel [place]   11/22/1965  [id number]  Primary Physician Mechele ClaudeStacks, [place], MD Primary Cardiologist: Runell GessJonathan J Jessejames Steelman MD Roseanne RenoFACP, FACC, FAHA, FSCAI  HPI:  Luke Davila is a 53 y.o. moderately overweight divorced African-American male father of 2 children who works as a Merchandiser, retailsupervisor at [employer].  He was referred by Dr. Darlyn ReadStacks for cardiovascular dilation because of asymptomatic PVCs.  He has no cardiac risk factors.  There is no family history for heart disease.  He is never had a heart tach or stroke and denies chest pain or shortness of breath.  He was noted to have some skipped beats on a twelve-lead EKG and subsequent Holter monitor did show PVCs which he is unaware of.   Current Meds  Medication Sig  . augmented betamethasone dipropionate (DIPROLENE-AF) 0.05 % cream Apply topically 2 (two) times daily. At affected areas (avoid face and genitals)  . tamsulosin (FLOMAX) 0.4 MG CAPS capsule Take 2 capsules (0.8 mg total) by mouth daily after supper.  . Testosterone 30 MG/ACT SOLN 1 Applicator-ful under each arm daily     No Known Allergies  Social History   Socioeconomic History  . Marital status: Married    Spouse name: Ramona  . Number of children: 2  . Years of education: 12th  . Highest education level: Not on file  Occupational History    Employer: [employer]    Comment: [employer]  Social Needs  . Financial resource strain: Not on file  . Food insecurity:    Worry: Not on file    Inability: Not on file  . Transportation needs:    Medical: Not on file    Non-medical: Not on file  Tobacco Use  . Smoking status: Never Smoker  . Smokeless tobacco: Never Used  Substance and Sexual Activity  . Alcohol use: Yes    Comment: 3 beers daily  . Drug use: No  . Sexual activity: Yes    Birth control/protection: None  Lifestyle  . Physical activity:    Days per week: Not on file    Minutes per session: Not on file  . Stress: Not on file    Relationships  . Social connections:    Talks on phone: Not on file    Gets together: Not on file    Attends religious service: Not on file    Active member of club or organization: Not on file    Attends meetings of clubs or organizations: Not on file    Relationship status: Not on file  . Intimate partner violence:    Fear of current or ex partner: Not on file    Emotionally abused: Not on file    Physically abused: Not on file    Forced sexual activity: Not on file  Other Topics Concern  . Not on file  Social History Narrative   Patient lives at home with his family.   Caffeine Use: 1 soda daily   Patient is right handed     Review of Systems: General: negative for chills, fever, night sweats or weight changes.  Cardiovascular: negative for chest pain, dyspnea on exertion, edema, orthopnea, palpitations, paroxysmal nocturnal dyspnea or shortness of breath Dermatological: negative for rash Respiratory: negative for cough or wheezing Urologic: negative for hematuria Abdominal: negative for nausea, vomiting, diarrhea, bright red blood per rectum, melena, or hematemesis Neurologic: negative for visual changes, syncope, or dizziness All other systems reviewed and are otherwise negative except as noted above.  Blood pressure 124/72, pulse 79, height 6\' 3"  (1.905 m), weight 269 lb 3.2 oz (122.1 kg), SpO2 98 %.  General appearance: alert and no distress Neck: no adenopathy, no carotid bruit, no JVD, supple, symmetrical, trachea midline and thyroid not enlarged, symmetric, no tenderness/mass/nodules Lungs: clear to auscultation bilaterally Heart: regular rate and rhythm, S1, S2 normal, no murmur, click, rub or gallop Extremities: extremities normal, atraumatic, no cyanosis or edema Pulses: 2+ and symmetric Skin: Skin color, texture, turgor normal. No rashes or lesions Neurologic: Alert and oriented X 3, normal strength and tone. Normal symmetric reflexes. Normal coordination  and gait  EKG not performed today  ASSESSMENT AND PLAN:   PVC's (premature ventricular contractions) Mr. Boldon was referred to me by Dr. Selina Cooley for asymptomatic PVCs.  He has no cardiac risk factors.  Does drink beers on his off days.  He is never had a heart attack or stroke.  Denies chest pain or shortness of breath.  He recently had a 24-hour Holter monitor that showed unifocal PVCs as well as couplets which he is unaware of.  Hyperlipidemia Profile performed 05/12/2017 revealed total cholesterol 226, LDL 139 and HDL 68.  He is not on a statin.  He does admit to dietary indiscretion.      Runell Gess MD FACP,FACC,FAHA, [facility] 02/03/2018 10:56 AM

## 2018-02-05 ENCOUNTER — Encounter: Payer: Self-pay | Admitting: *Deleted

## 2018-02-16 ENCOUNTER — Other Ambulatory Visit (HOSPITAL_COMMUNITY): Payer: 59

## 2018-02-23 ENCOUNTER — Ambulatory Visit (HOSPITAL_COMMUNITY): Payer: 59 | Attending: Internal Medicine

## 2018-02-23 DIAGNOSIS — I493 Ventricular premature depolarization: Secondary | ICD-10-CM

## 2018-03-02 ENCOUNTER — Ambulatory Visit: Payer: 59 | Admitting: Cardiology

## 2018-03-16 ENCOUNTER — Telehealth: Payer: Self-pay | Admitting: Cardiovascular Disease

## 2018-03-16 NOTE — Telephone Encounter (Signed)
Please call with echo results 

## 2018-03-16 NOTE — Telephone Encounter (Signed)
Returned call to patient echo results given. 

## 2018-03-19 ENCOUNTER — Other Ambulatory Visit: Payer: Self-pay

## 2018-03-19 ENCOUNTER — Encounter: Payer: Self-pay | Admitting: Nurse Practitioner

## 2018-03-19 ENCOUNTER — Ambulatory Visit: Payer: 59 | Admitting: Nurse Practitioner

## 2018-03-19 ENCOUNTER — Ambulatory Visit (INDEPENDENT_AMBULATORY_CARE_PROVIDER_SITE_OTHER): Payer: 59

## 2018-03-19 VITALS — BP 137/80 | HR 78 | Temp 97.4°F | Ht 75.0 in | Wt 275.2 lb

## 2018-03-19 DIAGNOSIS — M25561 Pain in right knee: Secondary | ICD-10-CM

## 2018-03-19 DIAGNOSIS — G8929 Other chronic pain: Secondary | ICD-10-CM

## 2018-03-19 DIAGNOSIS — M25562 Pain in left knee: Secondary | ICD-10-CM | POA: Diagnosis not present

## 2018-03-19 DIAGNOSIS — M170 Bilateral primary osteoarthritis of knee: Secondary | ICD-10-CM | POA: Diagnosis not present

## 2018-03-19 DIAGNOSIS — R609 Edema, unspecified: Secondary | ICD-10-CM

## 2018-03-19 MED ORDER — DICLOFENAC SODIUM 1 % TD GEL: 2.0000 g | Freq: Four times a day (QID) | TRANSDERMAL | 1 refills | Status: DC

## 2018-03-19 MED ORDER — FUROSEMIDE 20 MG PO TABS: 20.0000 mg | ORAL_TABLET | ORAL | 3 refills | Status: DC

## 2018-03-19 NOTE — Progress Notes (Signed)
   Subjective:    Patient ID: Luke Davila, male    DOB: 1965-08-20, 53 y.o.   MRN: [id number]   Chief Complaint: Edema (Bilateral ankles) and Leg Pain (Bilateral)   HPI Patient come sin c/o bil lower ext edema. His legs feel heavy at times. He says his knees and hips hurt all the time. He has had a total hip replacement in the past.   Review of Systems  Constitutional: Negative for activity change and appetite change.  HENT: Negative.   Eyes: Negative for pain.  Respiratory: Negative for shortness of breath.   Cardiovascular: Negative for chest pain, palpitations and leg swelling.  Gastrointestinal: Negative for abdominal pain.  Endocrine: Negative for polydipsia.  Genitourinary: Negative.   Skin: Negative for rash.  Neurological: Negative for dizziness, weakness and headaches.  Hematological: Does not bruise/bleed easily.  Psychiatric/Behavioral: Negative.   All other systems reviewed and are negative.      Objective:   Physical Exam Vitals signs and nursing note reviewed.  Constitutional:      Appearance: Normal appearance. He is normal weight.  Cardiovascular:     Rate and Rhythm: Normal rate and regular rhythm.     Heart sounds: Normal heart sounds.  Pulmonary:     Effort: Pulmonary effort is normal.     Breath sounds: Normal breath sounds.  Musculoskeletal:     Right lower leg: Edema (1+) present.     Left lower leg: Edema (1+) present.     Comments: Gait slow and staedy with inability to straighten knees out. Crepitus on flexion and extension bil Mild effusion bil knees  Skin:    General: Skin is warm and dry.  Neurological:     General: No focal deficit present.     Mental Status: He is alert and oriented to person, place, and time.  Psychiatric:        Mood and Affect: Mood normal.        Behavior: Behavior normal.    BP 137/80   Pulse 78   Temp (!) 97.4 F (36.3 C) (Oral)   Ht 6\' 3"  (1.905 m)   Wt 275 lb 3.2 oz (124.8 kg)   BMI 34.40 kg/m    Right knee- mild joint space narrowing with mild osteoarthritis laterally. -Preliminary reading by Paulene Floor, FNP  [facility] Left knee- joint space narrowing medially-Preliminary reading by Paulene Floor, FNP  [facility]        Assessment & Plan:  Luke Davila in today with chief complaint of Edema (Bilateral ankles) and Leg Pain (Bilateral)   1. Chronic pain of both knees Will doo referral to ortho - DG Knee 1-2 Views Left; Future - DG Knee 1-2 Views Right; Future - diclofenac sodium (VOLTAREN) 1 % GEL; Apply 2 g topically 4 (four) times daily.  Dispense: 500 g; Refill: 1  2. Peripheral edema Elevate when sitting Wear elastic knee brace when at work - furosemide (LASIX) 20 MG tablet; Take 1 tablet (20 mg total) by mouth every other day.  Dispense: 30 tablet; Refill: 3  Mary-Margaret Daphine Deutscher, FNP

## 2018-03-19 NOTE — Patient Instructions (Signed)

## 2018-03-24 ENCOUNTER — Encounter (INDEPENDENT_AMBULATORY_CARE_PROVIDER_SITE_OTHER): Payer: Self-pay | Admitting: Orthopaedic Surgery

## 2018-03-24 ENCOUNTER — Other Ambulatory Visit: Payer: Self-pay

## 2018-03-24 ENCOUNTER — Ambulatory Visit (INDEPENDENT_AMBULATORY_CARE_PROVIDER_SITE_OTHER): Payer: 59 | Admitting: Orthopaedic Surgery

## 2018-03-24 VITALS — BP 131/96 | HR 78 | Ht 75.0 in | Wt 260.0 lb

## 2018-03-24 DIAGNOSIS — M25561 Pain in right knee: Secondary | ICD-10-CM

## 2018-03-24 DIAGNOSIS — M170 Bilateral primary osteoarthritis of knee: Secondary | ICD-10-CM

## 2018-03-24 DIAGNOSIS — M25562 Pain in left knee: Secondary | ICD-10-CM | POA: Diagnosis not present

## 2018-03-24 MED ORDER — METHYLPREDNISOLONE ACETATE 40 MG/ML IJ SUSP
80.0000 mg | INTRAMUSCULAR | Status: AC | PRN
  Administered 2018-03-24: 80 mg via INTRA_ARTICULAR

## 2018-03-24 MED ORDER — LIDOCAINE HCL 1 % IJ SOLN
2.0000 mL | INTRAMUSCULAR | Status: AC | PRN
  Administered 2018-03-24: 2 mL

## 2018-03-24 MED ORDER — BUPIVACAINE HCL 0.5 % IJ SOLN
2.0000 mL | INTRAMUSCULAR | Status: AC | PRN
  Administered 2018-03-24: 2 mL via INTRA_ARTICULAR

## 2018-03-24 NOTE — Progress Notes (Signed)
Office Visit Note   Patient: Luke Davila           Date of Birth: 1965-06-11           MRN: [id number] Visit Date: 03/24/2018              Requested by: Bennie Pierini, FNP [address] [place], [place] [address] PCP: Mechele Claude, MD   Assessment & Plan: Visit Diagnoses:  1. Bilateral primary osteoarthritis of knee     Plan: Advanced osteoarthritis both knees.  Will inject with cortisone.  Long discussion regarding diagnosis and treatment options over time including knee replacement.  Might be a candidate for Visco supplementation  Follow-Up Instructions: No follow-ups on file.   Orders:  Orders Placed This Encounter  Procedures  . Large Joint Inj: bilateral knee   No orders of the defined types were placed in this encounter.     Procedures: Large Joint Inj: bilateral knee on 03/24/2018 2:05 PM Indications: diagnostic evaluation Details: 25 G 1.5 in needle, anteromedial approach  Arthrogram: No  Medications (Right): 2 mL lidocaine 1 %; 2 mL bupivacaine 0.5 %; 80 mg methylPREDNISolone acetate 40 MG/ML Medications (Left): 2 mL lidocaine 1 %; 2 mL bupivacaine 0.5 %; 80 mg methylPREDNISolone acetate 40 MG/ML Consent was given by the patient. Immediately prior to procedure a time out was called to verify the correct patient, procedure, equipment, support staff and site/side marked as required. Patient was prepped and draped in the usual sterile fashion.       Clinical Data: No additional findings.   Subjective: Chief Complaint  Patient presents with  . Left Knee - Pain  . Right Knee - Pain  Patient presents today with bilateral knee pain. He said that the left is worse than the right. No known injury. He said that they both get painful after any prolonged rest and has a difficult time walking. Both knees feel "tight" and radiates into his thighs. He saw his PCP last week and had x-rays taken.  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  He does have a history of a left total hip replacement.  His biggest problem with his knees are stiffness and soreness. Did review films of his knees on the PACS system.  There is considerable sclerosis in the medial compartment of both of his knees with subchondral sclerosis small osteophytes and narrowing of the joint space with 1 and 2 degrees of varus.  Findings are a bit more advanced on the left knee compared to the right distant with moderate osteoarthritis  HPI  Review of Systems   Objective: Vital Signs: BP (!) 131/96   Pulse 78   Ht 6\' 3"  (1.905 m)   Wt 260 lb (117.9 kg)   BMI 32.50 kg/m   Physical Exam Constitutional:      Appearance: He is well-developed.  Eyes:     Pupils: Pupils are equal, round, and reactive to light.  Pulmonary:     Effort: Pulmonary effort is normal.  Skin:    General: Skin is warm and dry.  Neurological:     Mental Status: He is alert and oriented to person, place, and time.  Psychiatric:        Behavior: Behavior normal.     Ortho Exam awake alert and oriented x3.  Comfortable sitting.  No pain with range of motion of either hip.  There was a flexion  contracture of probably 10 degrees on the left knee and about 5 degrees on the right.  Knees were not hot red or swollen.  Lection over 105 degrees.  No instability.  Some patellar crepitation.  Mostly medial joint pain.  Slight increased varus with weightbearing.  Specialty Comments:  No specialty comments available.  Imaging: No results found.   PMFS History: Patient Active Problem List   Diagnosis Date Noted  . Bilateral primary osteoarthritis of knee 03/24/2018  . PVC's (premature ventricular contractions) 02/03/2018  . Hyperlipidemia 02/03/2018  . Hyponatremia 08/06/2016  . Constipation 10/29/2015  . OA (osteoarthritis) of hip 04/09/2015  . Hypogonadism in male 09/12/2014  . 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 Visual field loss 09/29/2013  . Erectile dysfunction 06/25/2012  . Gout 06/25/2012   Past Medical History:  Diagnosis Date  . Avascular necrosis of hip (HCC)    LEFT  . Enlarged prostate   . Glaucoma   . Gout   . History of gout     Family History  Problem Relation Age of Onset  . Hypertension Mother   . Colon cancer Neg Hx     Past Surgical History:  Procedure Laterality Date  . COLONOSCOPY WITH PROPOFOL N/A 11/27/2015   Procedure: COLONOSCOPY WITH PROPOFOL;  Surgeon: West Bali, MD;  Location: [facility];  Service: Endoscopy;  Laterality: N/A;  1100  . FOOT SURGERY Left 1985  . Left hand surgery    . TOTAL HIP ARTHROPLASTY Left 04/09/2015   Procedure: LEFT TOTAL HIP ARTHROPLASTY ANTERIOR APPROACH;  Surgeon: Ollen Gross, MD;  Location: [facility];  Service: Orthopedics;  Laterality: Left;   Social History   Occupational History    Employer: [employer]    [employer] administrator  Tobacco Use  . Smoking status: Never Smoker  . Smokeless tobacco: Never Used  Substance and Sexual Activity  . Alcohol use: Yes    Comment: 3 beers daily  . Drug use: No  . Sexual activity: Yes    Birth control/protection: None

## 2018-07-02 ENCOUNTER — Telehealth: Payer: 59

## 2018-07-14 ENCOUNTER — Other Ambulatory Visit: Payer: Self-pay

## 2018-07-15 ENCOUNTER — Encounter: Payer: Self-pay | Admitting: Family

## 2018-07-15 ENCOUNTER — Ambulatory Visit: Payer: 59 | Admitting: Family

## 2018-07-15 VITALS — Temp 98.2°F | Ht 75.0 in | Wt 271.0 lb

## 2018-07-15 DIAGNOSIS — R5383 Other fatigue: Secondary | ICD-10-CM

## 2018-07-15 DIAGNOSIS — R002 Palpitations: Secondary | ICD-10-CM

## 2018-07-15 DIAGNOSIS — R42 Dizziness and giddiness: Secondary | ICD-10-CM

## 2018-07-15 NOTE — Patient Instructions (Signed)
Near-Syncope Near-syncope is when you suddenly feel like you might pass out (faint), but you do not actually lose consciousness. This may also be referred to as presyncope. During an episode of near-syncope, you may:  Feel dizzy, weak, or light-headed.  Feel nauseous.  See all white or all black in your field of vision, or see spots.  Have cold, clammy skin. This condition is caused by a sudden decrease in blood flow to the brain. This decrease can result from various causes, but most of those causes are not dangerous. However, near-syncope may be a sign of a serious medical problem, so it is important to seek medical care. Follow these instructions at home: Medicines  Take over-the-counter and prescription medicines only as told by your health care provider.  If you are taking blood pressure or heart medicine, get up slowly and take several minutes to sit and then stand. This can reduce dizziness. General instructions  Pay attention to any changes in your symptoms.  Talk with your health care provider about your symptoms. You may need to have testing to understand the cause of your near-syncope.  If you start to feel like you might faint, lie down right away and raise (elevate) your feet above the level of your heart. Breathe deeply and steadily. Wait until all of the symptoms have passed.  Have someone stay with you until you feel stable.  Do not drive, use machinery, or play sports until your health care provider says it is okay.  Drink enough fluid to keep your urine pale yellow.  Keep all follow-up visits as told by your health care provider. This is important. Get help right away if you:  Have a seizure.  Have unusual pain in your chest, abdomen, or back.  Faint once or repeatedly.  Have a severe headache.  Are bleeding from your mouth or rectum, or you have black or tarry stool.  Have a very fast or irregular heartbeat (palpitations).  Are confused.  Have  trouble walking.  Have severe weakness.  Have vision problems. These symptoms may represent a serious problem that is an emergency. Do not wait to see if your symptoms will go away. Get medical help right away. Call your local emergency services (911 in the [place] Do not drive yourself to the hospital. Summary  Near-syncope is when you suddenly feel like you might pass out (faint), but you do not actually lose consciousness.  This condition is caused by a sudden decrease in blood flow to the brain. This decrease can result from various causes, but most of those causes are not dangerous.  Near-syncope may be a sign of a serious medical problem, so it is important to seek medical care. This information is not intended to replace advice given to you by your health care provider. Make sure you discuss any questions you have with your health care provider. Document Released: 12/23/2004 Document Revised: 04/16/2018 Document Reviewed: 11/11/2017 Elsevier Patient Education  2020 Elsevier Inc.  

## 2018-07-15 NOTE — Progress Notes (Signed)
Subjective:    Patient ID: Luke Davila, male    DOB: Oct 14, 1965, 53 y.o.   MRN: [id number]  Chief Complaint  Patient presents with  . feels like he is going to pass out    happens when standing up from sitting position    HPI Pt presents to the office today with complaints of near syncope episode. He states yesterday when he was getting out of his truck he felt dizzy, light headed and had to sit back down. He states after sitting there he felt better. He went home and sat on his recliner and drank several bottles of water and Gatorade.   He denies any dizziness, fatigue, or lightheadness now. He does report if he stood up fast he would feel slightly off balance. He does report intermittent palpitations.     Review of Systems  All other systems reviewed and are negative.      Objective:   Physical Exam Vitals signs reviewed.  Constitutional:      General: He is not in acute distress.    Appearance: He is well-developed.  HENT:     Head: Normocephalic.     Right Ear: Tympanic membrane normal.     Left Ear: Tympanic membrane normal.  Eyes:     General:        Right eye: No discharge.        Left eye: No discharge.     Pupils: Pupils are equal, round, and reactive to light.  Neck:     Musculoskeletal: Normal range of motion and neck supple.     Thyroid: No thyromegaly.  Cardiovascular:     Rate and Rhythm: Normal rate and regular rhythm.     Heart sounds: Normal heart sounds. No murmur.  Pulmonary:     Effort: Pulmonary effort is normal. No respiratory distress.     Breath sounds: Normal breath sounds. No wheezing.  Abdominal:     General: Bowel sounds are normal. There is no distension.     Palpations: Abdomen is soft.     Tenderness: There is no abdominal tenderness.  Musculoskeletal:        General: Tenderness present.     Comments: Right hip pain with flexion and extension   Skin:    General: Skin is warm and dry.     Findings: No erythema or rash.   Neurological:     Mental Status: He is alert and oriented to person, place, and time.     Cranial Nerves: No cranial nerve deficit.     Deep Tendon Reflexes: Reflexes are normal and symmetric.  Psychiatric:        Behavior: Behavior normal.        Thought Content: Thought content normal.        Judgment: Judgment normal.       BP 133/80   Pulse (!) 52   Temp 98.2 F (36.8 C) (Oral)   Ht '6\' 3"'  (1.905 m)   Wt 271 lb (122.9 kg)   BMI 33.87 kg/m      Assessment & Plan:  [name] comes in today with chief complaint of feels like he is going to pass out (happens when standing up from sitting position)   Diagnosis and orders addressed:  1. Fatigue, unspecified type - EKG 12-Lead - CMP14+EGFR - Anemia Profile B - TSH  2. Dizziness - CMP14+EGFR - Anemia Profile B - TSH  3. Palpitations - CMP14+EGFR - Anemia Profile B - TSH   Follow  up plan: Keep follow up with PCP   Evelina Dun, FNP

## 2018-07-16 LAB — CMP14+EGFR
ALT: 21 IU/L (ref 0–44)
AST: 40 IU/L (ref 0–40)
Albumin/Globulin Ratio: 1.8 (ref 1.2–2.2)
Albumin: 4.4 g/dL (ref 3.8–4.9)
Alkaline Phosphatase: 128 IU/L — ABNORMAL HIGH (ref 39–117)
BUN/Creatinine Ratio: 9 (ref 9–20)
BUN: 11 mg/dL (ref 6–24)
Bilirubin Total: 1.1 mg/dL (ref 0.0–1.2)
CO2: 21 mmol/L (ref 20–29)
Calcium: 9.3 mg/dL (ref 8.7–10.2)
Chloride: 92 mmol/L — ABNORMAL LOW (ref 96–106)
Creatinine, Ser: 1.25 mg/dL (ref 0.76–1.27)
GFR calc Af Amer: 76 mL/min/{1.73_m2} (ref >59)
GFR calc non Af Amer: 66 mL/min/{1.73_m2} (ref >59)
Globulin, Total: 2.4 g/dL (ref 1.5–4.5)
Glucose: 110 mg/dL — ABNORMAL HIGH (ref 65–99)
Potassium: 3.9 mmol/L (ref 3.5–5.2)
Sodium: 130 mmol/L — ABNORMAL LOW (ref 134–144)
Total Protein: 6.8 g/dL (ref 6.0–8.5)

## 2018-07-16 LAB — ANEMIA PROFILE B
Basophils Absolute: 0.1 10*3/uL (ref 0.0–0.2)
Basos: 1 %
EOS (ABSOLUTE): 0.5 10*3/uL — ABNORMAL HIGH (ref 0.0–0.4)
Eos: 10 %
Ferritin: 471 ng/mL — ABNORMAL HIGH (ref 30–400)
Folate: 7.5 ng/mL (ref >3.0)
Hematocrit: 34.7 % — ABNORMAL LOW (ref 37.5–51.0)
Hemoglobin: 11.5 g/dL — ABNORMAL LOW (ref 13.0–17.7)
Immature Grans (Abs): 0 10*3/uL (ref 0.0–0.1)
Immature Granulocytes: 1 %
Iron Saturation: 62 % — ABNORMAL HIGH (ref 15–55)
Iron: 188 ug/dL — ABNORMAL HIGH (ref 38–169)
Lymphocytes Absolute: 1 10*3/uL (ref 0.7–3.1)
Lymphs: 21 %
MCH: 26 pg — ABNORMAL LOW (ref 26.6–33.0)
MCHC: 33.1 g/dL (ref 31.5–35.7)
MCV: 79 fL (ref 79–97)
Monocytes Absolute: 0.6 10*3/uL (ref 0.1–0.9)
Monocytes: 13 %
Neutrophils Absolute: 2.7 10*3/uL (ref 1.4–7.0)
Neutrophils: 54 %
Platelets: 227 10*3/uL (ref 150–450)
RBC: 4.42 x10E6/uL (ref 4.14–5.80)
RDW: 14.6 % (ref 11.6–15.4)
Retic Ct Pct: 2.7 % — ABNORMAL HIGH (ref 0.6–2.6)
Total Iron Binding Capacity: 302 ug/dL (ref 250–450)
UIBC: 114 ug/dL (ref 111–343)
Vitamin B-12: 522 pg/mL (ref 232–1245)
WBC: 4.8 10*3/uL (ref 3.4–10.8)

## 2018-07-16 LAB — TSH: TSH: 2.97 u[IU]/mL (ref 0.450–4.500)

## 2018-07-26 ENCOUNTER — Other Ambulatory Visit: Payer: Self-pay | Admitting: Family Medicine

## 2018-07-26 ENCOUNTER — Other Ambulatory Visit: Payer: Self-pay | Admitting: *Deleted

## 2018-07-26 DIAGNOSIS — E291 Testicular hypofunction: Secondary | ICD-10-CM

## 2018-07-26 DIAGNOSIS — R5383 Other fatigue: Secondary | ICD-10-CM

## 2018-07-26 DIAGNOSIS — Z0000 Encounter for general adult medical examination without abnormal findings: Secondary | ICD-10-CM

## 2018-07-26 NOTE — Telephone Encounter (Signed)
Pt seen for this 1/20 - stacks for this med.   Please address refill as back up provider for stacks. ( he will be off x 2 weeks)

## 2018-07-26 NOTE — Telephone Encounter (Signed)
Patient needs OV for this medication.  He needs testosterone check.  Last was 05/2017.  Please have him schedule in office visit for refills

## 2018-07-26 NOTE — Telephone Encounter (Signed)
Pt aware  - will come for labs and has appt 08/11/18.

## 2018-07-26 NOTE — Telephone Encounter (Signed)
Will need early appt or will have to come in early for testosterone draw (8am).  I can place orders if he will be seeing me. Otherwise, please have whoever he will be seeing place order.

## 2018-07-27 ENCOUNTER — Ambulatory Visit: Payer: 59 | Admitting: Family Medicine

## 2018-07-28 ENCOUNTER — Other Ambulatory Visit: Payer: Self-pay

## 2018-07-28 ENCOUNTER — Other Ambulatory Visit: Payer: 59

## 2018-07-28 DIAGNOSIS — R5383 Other fatigue: Secondary | ICD-10-CM

## 2018-07-28 DIAGNOSIS — Z0000 Encounter for general adult medical examination without abnormal findings: Secondary | ICD-10-CM

## 2018-07-28 DIAGNOSIS — E291 Testicular hypofunction: Secondary | ICD-10-CM

## 2018-07-29 LAB — CBC WITH DIFFERENTIAL/PLATELET
Basophils Absolute: 0.1 10*3/uL (ref 0.0–0.2)
Basos: 2 %
EOS (ABSOLUTE): 0.5 10*3/uL — ABNORMAL HIGH (ref 0.0–0.4)
Eos: 14 %
Hematocrit: 33.4 % — ABNORMAL LOW (ref 37.5–51.0)
Hemoglobin: 11.6 g/dL — ABNORMAL LOW (ref 13.0–17.7)
Immature Grans (Abs): 0 10*3/uL (ref 0.0–0.1)
Immature Granulocytes: 1 %
Lymphocytes Absolute: 0.9 10*3/uL (ref 0.7–3.1)
Lymphs: 28 %
MCH: 26.2 pg — ABNORMAL LOW (ref 26.6–33.0)
MCHC: 34.7 g/dL (ref 31.5–35.7)
MCV: 76 fL — ABNORMAL LOW (ref 79–97)
Monocytes Absolute: 0.4 10*3/uL (ref 0.1–0.9)
Monocytes: 12 %
Neutrophils Absolute: 1.4 10*3/uL (ref 1.4–7.0)
Neutrophils: 43 %
Platelets: 278 10*3/uL (ref 150–450)
RBC: 4.42 x10E6/uL (ref 4.14–5.80)
RDW: 13.7 % (ref 11.6–15.4)
WBC: 3.3 10*3/uL — ABNORMAL LOW (ref 3.4–10.8)

## 2018-07-29 LAB — PSA, TOTAL AND FREE
PSA, Free Pct: 30 %
PSA, Free: 0.09 ng/mL
Prostate Specific Ag, Serum: 0.3 ng/mL (ref 0.0–4.0)

## 2018-07-29 LAB — LIPID PANEL
Chol/HDL Ratio: 5.1 ratio — ABNORMAL HIGH (ref 0.0–5.0)
Cholesterol, Total: 230 mg/dL — ABNORMAL HIGH (ref 100–199)
HDL: 45 mg/dL (ref >39)
LDL Calculated: 129 mg/dL — ABNORMAL HIGH (ref 0–99)
Triglycerides: 280 mg/dL — ABNORMAL HIGH (ref 0–149)
VLDL Cholesterol Cal: 56 mg/dL — ABNORMAL HIGH (ref 5–40)

## 2018-07-29 LAB — TESTOSTERONE,FREE AND TOTAL
Testosterone, Free: 7 pg/mL — ABNORMAL LOW (ref 7.2–24.0)
Testosterone: 304 ng/dL (ref 264–916)

## 2018-08-11 ENCOUNTER — Ambulatory Visit (INDEPENDENT_AMBULATORY_CARE_PROVIDER_SITE_OTHER): Payer: 59 | Admitting: Family Medicine

## 2018-08-11 ENCOUNTER — Other Ambulatory Visit: Payer: Self-pay

## 2018-08-11 ENCOUNTER — Encounter: Payer: Self-pay | Admitting: Family Medicine

## 2018-08-11 DIAGNOSIS — E291 Testicular hypofunction: Secondary | ICD-10-CM

## 2018-08-11 DIAGNOSIS — N528 Other male erectile dysfunction: Secondary | ICD-10-CM

## 2018-08-11 DIAGNOSIS — M162 Bilateral osteoarthritis resulting from hip dysplasia: Secondary | ICD-10-CM | POA: Diagnosis not present

## 2018-08-11 DIAGNOSIS — F5101 Primary insomnia: Secondary | ICD-10-CM | POA: Diagnosis not present

## 2018-08-11 DIAGNOSIS — E871 Hypo-osmolality and hyponatremia: Secondary | ICD-10-CM

## 2018-08-11 MED ORDER — ZOLPIDEM TARTRATE 5 MG PO TABS: 5.0000 mg | ORAL_TABLET | Freq: Every evening | ORAL | 2 refills | Status: DC | PRN

## 2018-08-11 MED ORDER — TESTOSTERONE 30 MG/ACT TD SOLN: TRANSDERMAL | 5 refills | Status: DC

## 2018-08-11 NOTE — Progress Notes (Signed)
Subjective:    Patient ID: Luke Davila, male    DOB: 10/23/1965, 53 y.o.   MRN: [id number]   HPI: Luke Davila is a 53 y.o. male presenting for Follow up for testosterone deficiency: Pt. Using medication as directed. Denies any sx referrable to DVT such as edema or erythema of legs. No dyspnea or chest pain. Energy level reported as being good. Libido is normal and denies E.D. Feels strength is adequate and improved from baseline.  Patient is having some pain in his hips at night that seems to be keeping him awake.  He is required on getting the past for sleep.  He has been using several over-the-counter remedies without any success.  He says he is tossing and turning all night.  He is only getting about an hour sleep.  He is worried about getting so drowsy falls asleep at work and then he will get fired.  He is not using the Voltaren gel because he thought it was just for the knee.  The knee is not what is really bothering him.  He is using Diprolene from time to time.  We discussed the possibility of that causing his sodium to drop.   Depression screen [facility] 2/9 07/15/2018 03/19/2018 03/03/2017 01/20/2017 08/05/2016  Decreased Interest 0 0 1 0 0  Down, Depressed, Hopeless 0 0 0 0 0  PHQ - 2 Score 0 0 1 0 0     Relevant past medical, surgical, family and social history reviewed and updated as indicated.  Interim medical history since our last visit reviewed. Allergies and medications reviewed and updated.  ROS:  Review of Systems  Constitutional: Negative.   HENT: Negative.   Eyes: Negative for visual disturbance.  Respiratory: Negative for cough and shortness of breath.   Cardiovascular: Negative for chest pain and leg swelling.  Gastrointestinal: Negative for abdominal pain, diarrhea, nausea and vomiting.  Genitourinary: Negative for difficulty urinating.  Musculoskeletal: Positive for arthralgias (Both hips). Negative for myalgias.  Skin: Negative for rash.  Neurological: Negative  for headaches.  Psychiatric/Behavioral: Positive for sleep disturbance.     Social History   Tobacco Use  Smoking Status Never Smoker  Smokeless Tobacco Never Used       Objective:     Wt Readings from Last 3 Encounters:  07/15/18 271 lb (122.9 kg)  03/24/18 260 lb (117.9 kg)  03/19/18 275 lb 3.2 oz (124.8 kg)     Exam deferred. Pt. Harboring due to COVID 19. Phone visit performed.   Assessment & Plan:   1. Hypogonadism in male   2. Other male erectile dysfunction   3. Osteoarthritis of both hips resulting from hip dysplasia   4. Primary insomnia   5. Hyponatremia     Meds ordered this encounter  Medications  . zolpidem (AMBIEN) 5 MG tablet    Sig: Take 1 tablet (5 mg total) by mouth at bedtime as needed for sleep.    Dispense:  30 tablet    Refill:  2  . Testosterone 30 MG/ACT SOLN    Sig: 1 Applicator-ful under each arm daily    Dispense:  90 mL    Refill:  5    Orders Placed This Encounter  Procedures  . BMP8+EGFR    Order Specific Question:   Has the patient fasted?    Answer:   No      Diagnoses and all orders for this visit:  Hypogonadism in male -     BMP8+EGFR  Other  male erectile dysfunction  Osteoarthritis of both hips resulting from hip dysplasia -     BMP8+EGFR  Primary insomnia  Hyponatremia -     BMP8+EGFR  Other orders -     zolpidem (AMBIEN) 5 MG tablet; Take 1 tablet (5 mg total) by mouth at bedtime as needed for sleep. -     Testosterone 30 MG/ACT SOLN; 1 Applicator-ful under each arm daily    Virtual Visit via telephone Note  I discussed the limitations, risks, security and privacy concerns of performing an evaluation and management service by telephone and the availability of in person appointments. The patient was identified with two identifiers. Pt.expressed understanding and agreed to proceed. Pt. Is at home. Dr. Livia Snellen is in his office.  Follow Up Instructions:   I discussed the assessment and treatment plan with  the patient. The patient was provided an opportunity to ask questions and all were answered. The patient agreed with the plan and demonstrated an understanding of the instructions.   The patient was advised to call back or seek an in-person evaluation if the symptoms worsen or if the condition fails to improve as anticipated.   Total minutes including chart review and phone contact time: 20   Follow up plan: Return in about 6 weeks (around 09/22/2018).  Claretta Fraise, MD [place]

## 2018-10-15 ENCOUNTER — Other Ambulatory Visit: Payer: Self-pay | Admitting: Family Medicine

## 2019-01-11 ENCOUNTER — Other Ambulatory Visit: Payer: Self-pay | Admitting: Orthopedic Surgery

## 2019-01-11 DIAGNOSIS — M545 Low back pain, unspecified: Secondary | ICD-10-CM

## 2019-02-02 ENCOUNTER — Other Ambulatory Visit: Payer: Self-pay

## 2019-02-02 ENCOUNTER — Ambulatory Visit
Admission: RE | Admit: 2019-02-02 | Discharge: 2019-02-02 | Disposition: A | Discharge status: Home / Self Care | Payer: 59 | Source: Ambulatory Visit | Attending: Orthopedic Surgery | Admitting: Orthopedic Surgery

## 2019-02-02 DIAGNOSIS — M545 Low back pain, unspecified: Secondary | ICD-10-CM

## 2019-02-11 ENCOUNTER — Other Ambulatory Visit: Payer: Self-pay

## 2019-02-14 ENCOUNTER — Encounter: Payer: Self-pay | Admitting: Family Medicine

## 2019-02-14 ENCOUNTER — Other Ambulatory Visit: Payer: Self-pay

## 2019-02-14 ENCOUNTER — Ambulatory Visit (INDEPENDENT_AMBULATORY_CARE_PROVIDER_SITE_OTHER): Payer: 59 | Admitting: Family Medicine

## 2019-02-14 VITALS — BP 126/82 | HR 90 | Temp 95.6°F | Ht 75.0 in | Wt 267.8 lb

## 2019-02-14 DIAGNOSIS — L309 Dermatitis, unspecified: Secondary | ICD-10-CM | POA: Diagnosis not present

## 2019-02-14 DIAGNOSIS — E291 Testicular hypofunction: Secondary | ICD-10-CM | POA: Diagnosis not present

## 2019-02-14 DIAGNOSIS — E559 Vitamin D deficiency, unspecified: Secondary | ICD-10-CM

## 2019-02-14 DIAGNOSIS — M162 Bilateral osteoarthritis resulting from hip dysplasia: Secondary | ICD-10-CM

## 2019-02-14 DIAGNOSIS — F5101 Primary insomnia: Secondary | ICD-10-CM | POA: Diagnosis not present

## 2019-02-14 DIAGNOSIS — E782 Mixed hyperlipidemia: Secondary | ICD-10-CM

## 2019-02-14 DIAGNOSIS — M109 Gout, unspecified: Secondary | ICD-10-CM

## 2019-02-14 DIAGNOSIS — M2550 Pain in unspecified joint: Secondary | ICD-10-CM

## 2019-02-14 DIAGNOSIS — Z125 Encounter for screening for malignant neoplasm of prostate: Secondary | ICD-10-CM

## 2019-02-14 DIAGNOSIS — N401 Enlarged prostate with lower urinary tract symptoms: Secondary | ICD-10-CM

## 2019-02-14 DIAGNOSIS — R351 Nocturia: Secondary | ICD-10-CM

## 2019-02-14 MED ORDER — ZOLPIDEM TARTRATE 5 MG PO TABS: 5.0000 mg | ORAL_TABLET | Freq: Every evening | ORAL | 2 refills | Status: DC | PRN

## 2019-02-14 MED ORDER — TAMSULOSIN HCL 0.4 MG PO CAPS: 0.8000 mg | ORAL_CAPSULE | Freq: Every day | ORAL | 5 refills | Status: DC

## 2019-02-14 MED ORDER — BETAMETHASONE DIPROPIONATE AUG 0.05 % EX CREA: TOPICAL_CREAM | Freq: Two times a day (BID) | CUTANEOUS | 1 refills | Status: DC

## 2019-02-14 MED ORDER — TESTOSTERONE 30 MG/ACT TD SOLN: TRANSDERMAL | 5 refills | Status: DC

## 2019-02-14 NOTE — Progress Notes (Signed)
Subjective:  Patient ID: Luke Davila, male    DOB: [birth year]  Age: 54 y.o. MRN: [id number]  CC: Follow-up (6 month)   HPI Luke Davila presents for 18-monthfollow-up for his testosterone.  He stopped using the medicine he just got busy and quit thinking about it.  He would like to get back on that.  Additionally he is concerned about frequency of urination primarily in the form of nocturia several times a night.  He wonders if the procedure that he heard about on TV is available.  He said they just spray something up been to the prostate and that takes care of the problem.  Additionally he is out of his Ambien and needs that to help him sleep at night.  It does seem to be working with regular use.  He has some breaking out on his left ankle area that came back after he quit using the Diprolene AF several months ago.  He would like to have a renewal on that as well.  Patient says that he got an anti-inflammatory medicine from orthopedics last week.  She has been using for about 5 days.  He does not recall the name of the medicine.  It is helping the joints feel better primarily the hips and knees.  Depression screen [facility] 02/14/2019 07/15/2018 03/19/2018  Decreased Interest 0 0 0  Down, Depressed, Hopeless 0 0 0  PHQ - 2 Score 0 0 0    History SGavanhas a past medical history of Avascular necrosis of hip ([place], Enlarged prostate, Glaucoma, Gout, and History of gout.   He has a past surgical history that includes Foot surgery (Left, 1985); Left hand surgery; Total hip arthroplasty (Left, 04/09/2015); and Colonoscopy with propofol (N/A, 11/27/2015).   His family history includes Hypertension in his mother.He reports that he has never smoked. He has never used smokeless tobacco. He reports current alcohol use. He reports that he does not use drugs.    ROS Review of Systems  Constitutional: Negative.   HENT: Negative.   Eyes: Negative for visual disturbance.  Respiratory: Negative for  cough and shortness of breath.   Cardiovascular: Negative for chest pain and leg swelling.  Gastrointestinal: Negative for abdominal pain, diarrhea, nausea and vomiting.  Genitourinary: Negative for difficulty urinating.  Musculoskeletal: Negative for arthralgias and myalgias.  Skin: Negative for rash.  Neurological: Negative for headaches.  Psychiatric/Behavioral: Negative for sleep disturbance.    Objective:  BP 126/82   Pulse 90   Temp (!) 95.6 F (35.3 C) (Temporal)   Ht '6\' 3"'  (1.905 m)   Wt 267 lb 12.8 oz (121.5 kg)   BMI 33.47 kg/m   BP Readings from Last 3 Encounters:  02/14/19 126/82  03/24/18 (!) 131/96  03/19/18 137/80    Wt Readings from Last 3 Encounters:  02/14/19 267 lb 12.8 oz (121.5 kg)  07/15/18 271 lb (122.9 kg)  03/24/18 260 lb (117.9 kg)     Physical Exam Vitals reviewed.  Constitutional:      Appearance: He is well-developed.  HENT:     Head: Normocephalic and atraumatic.     Right Ear: Tympanic membrane and external ear normal. No decreased hearing noted.     Left Ear: Tympanic membrane and external ear normal. No decreased hearing noted.     Mouth/Throat:     Pharynx: No oropharyngeal exudate or posterior oropharyngeal erythema.  Eyes:     Pupils: Pupils are equal, round, and reactive to light.  Cardiovascular:  Rate and Rhythm: Normal rate and regular rhythm.     Heart sounds: No murmur.  Pulmonary:     Effort: No respiratory distress.     Breath sounds: Normal breath sounds.  Abdominal:     General: Bowel sounds are normal.     Palpations: Abdomen is soft. There is no mass.     Tenderness: There is no abdominal tenderness.  Musculoskeletal:     Cervical back: Normal range of motion and neck supple.       Assessment & Plan:   Luke Davila was seen today for follow-up.  Diagnoses and all orders for this visit:  Hypogonadism in male -     Testosterone 30 MG/ACT SOLN; 1 Applicator-ful under each arm daily -     CBC with  Differential/Platelet -     CMP14+EGFR -     Testosterone,Free and Total  Osteoarthritis of both hips resulting from hip dysplasia -     CBC with Differential/Platelet -     CMP14+EGFR  Primary insomnia -     zolpidem (AMBIEN) 5 MG tablet; Take 1 tablet (5 mg total) by mouth at bedtime as needed for sleep. -     CBC with Differential/Platelet -     CMP14+EGFR  Eczema, unspecified type -     augmented betamethasone dipropionate (DIPROLENE-AF) 0.05 % cream; Apply topically 2 (two) times daily. At affected areas (avoid face and genitals) -     CBC with Differential/Platelet -     CMP14+EGFR  Gout, unspecified cause, unspecified chronicity, unspecified site -     CBC with Differential/Platelet -     CMP14+EGFR -     Uric acid  Benign prostatic hyperplasia with nocturia -     tamsulosin (FLOMAX) 0.4 MG CAPS capsule; Take 2 capsules (0.8 mg total) by mouth daily after supper. -     CBC with Differential/Platelet -     CMP14+EGFR  Screening for prostate cancer -     CBC with Differential/Platelet -     CMP14+EGFR -     PSA Total (Reflex To Free)  Vitamin D deficiency -     CBC with Differential/Platelet -     CMP14+EGFR -     VITAMIN D 25 Hydroxy (Vit-D Deficiency, Fractures)  Arthralgia, unspecified joint -     CBC with Differential/Platelet -     CMP14+EGFR -     Sedimentation rate  Mixed hyperlipidemia -     Lipid panel       I have discontinued Luke Davila's diclofenac sodium. I am also having him maintain his Testosterone, zolpidem, tamsulosin, and augmented betamethasone dipropionate.  Allergies as of 02/14/2019   No Known Allergies     Medication List       Accurate as of February 14, 2019  9:22 PM. If you have any questions, ask your nurse or doctor.        STOP taking these medications   diclofenac sodium 1 % Gel Commonly known as: VOLTAREN Stopped by: Claretta Fraise, MD     TAKE these medications   augmented betamethasone dipropionate 0.05 %  cream Commonly known as: DIPROLENE-AF Apply topically 2 (two) times daily. At affected areas (avoid face and genitals)   tamsulosin 0.4 MG Caps capsule Commonly known as: FLOMAX Take 2 capsules (0.8 mg total) by mouth daily after supper.   Testosterone 30 MG/ACT Soln 1 Applicator-ful under each arm daily   zolpidem 5 MG tablet Commonly known as: AMBIEN Take 1 tablet (5 mg total)  by mouth at bedtime as needed for sleep.        Follow-up: Return in about 6 months (around 08/14/2019).  Claretta Fraise, M.D.

## 2019-02-15 ENCOUNTER — Other Ambulatory Visit: Payer: Self-pay

## 2019-02-15 ENCOUNTER — Other Ambulatory Visit: Payer: 59

## 2019-02-16 ENCOUNTER — Other Ambulatory Visit: Payer: Self-pay | Admitting: Family Medicine

## 2019-02-16 DIAGNOSIS — E782 Mixed hyperlipidemia: Secondary | ICD-10-CM

## 2019-02-16 LAB — CBC WITH DIFFERENTIAL/PLATELET
Basophils Absolute: 0 10*3/uL (ref 0.0–0.2)
Basos: 1 %
EOS (ABSOLUTE): 0.2 10*3/uL (ref 0.0–0.4)
Eos: 6 %
Hematocrit: 36.3 % — ABNORMAL LOW (ref 37.5–51.0)
Hemoglobin: 11.9 g/dL — ABNORMAL LOW (ref 13.0–17.7)
Immature Grans (Abs): 0 10*3/uL (ref 0.0–0.1)
Immature Granulocytes: 1 %
Lymphocytes Absolute: 1.3 10*3/uL (ref 0.7–3.1)
Lymphs: 31 %
MCH: 26.3 pg — ABNORMAL LOW (ref 26.6–33.0)
MCHC: 32.8 g/dL (ref 31.5–35.7)
MCV: 80 fL (ref 79–97)
Monocytes Absolute: 0.6 10*3/uL (ref 0.1–0.9)
Monocytes: 13 %
Neutrophils Absolute: 2 10*3/uL (ref 1.4–7.0)
Neutrophils: 48 %
Platelets: 329 10*3/uL (ref 150–450)
RBC: 4.52 x10E6/uL (ref 4.14–5.80)
RDW: 13.5 % (ref 11.6–15.4)
WBC: 4.2 10*3/uL (ref 3.4–10.8)

## 2019-02-16 LAB — TESTOSTERONE,FREE AND TOTAL
Testosterone, Free: 1.3 pg/mL — ABNORMAL LOW (ref 7.2–24.0)
Testosterone: <3 ng/dL — ABNORMAL LOW (ref 264–916)

## 2019-02-16 LAB — LIPID PANEL
Chol/HDL Ratio: 5.3 ratio — ABNORMAL HIGH (ref 0.0–5.0)
Cholesterol, Total: 243 mg/dL — ABNORMAL HIGH (ref 100–199)
HDL: 46 mg/dL (ref >39)
LDL Chol Calc (NIH): 170 mg/dL — ABNORMAL HIGH (ref 0–99)
Triglycerides: 149 mg/dL (ref 0–149)
VLDL Cholesterol Cal: 27 mg/dL (ref 5–40)

## 2019-02-16 LAB — CMP14+EGFR
ALT: 40 IU/L (ref 0–44)
AST: 58 IU/L — ABNORMAL HIGH (ref 0–40)
Albumin/Globulin Ratio: 1.4 (ref 1.2–2.2)
Albumin: 4.2 g/dL (ref 3.8–4.9)
Alkaline Phosphatase: 248 IU/L — ABNORMAL HIGH (ref 39–117)
BUN/Creatinine Ratio: 12 (ref 9–20)
BUN: 14 mg/dL (ref 6–24)
Bilirubin Total: 0.3 mg/dL (ref 0.0–1.2)
CO2: 21 mmol/L (ref 20–29)
Calcium: 10 mg/dL (ref 8.7–10.2)
Chloride: 104 mmol/L (ref 96–106)
Creatinine, Ser: 1.21 mg/dL (ref 0.76–1.27)
GFR calc Af Amer: 79 mL/min/{1.73_m2} (ref >59)
GFR calc non Af Amer: 68 mL/min/{1.73_m2} (ref >59)
Globulin, Total: 3.1 g/dL (ref 1.5–4.5)
Glucose: 86 mg/dL (ref 65–99)
Potassium: 4.2 mmol/L (ref 3.5–5.2)
Sodium: 142 mmol/L (ref 134–144)
Total Protein: 7.3 g/dL (ref 6.0–8.5)

## 2019-02-16 LAB — PSA TOTAL (REFLEX TO FREE): Prostate Specific Ag, Serum: 0.2 ng/mL (ref 0.0–4.0)

## 2019-02-16 LAB — VITAMIN D 25 HYDROXY (VIT D DEFICIENCY, FRACTURES): Vit D, 25-Hydroxy: 16.6 ng/mL — ABNORMAL LOW (ref 30.0–100.0)

## 2019-02-16 LAB — SEDIMENTATION RATE: Sed Rate: 14 mm/hr (ref 0–30)

## 2019-02-16 LAB — URIC ACID: Uric Acid: 6.4 mg/dL (ref 3.8–8.4)

## 2019-02-16 MED ORDER — ATORVASTATIN CALCIUM 40 MG PO TABS: 40.0000 mg | ORAL_TABLET | Freq: Every day | ORAL | 3 refills | Status: DC

## 2019-02-17 ENCOUNTER — Telehealth: Payer: Self-pay | Admitting: Family Medicine

## 2019-02-17 NOTE — Telephone Encounter (Signed)
Aware. Kidney and liver were checked.

## 2019-02-18 ENCOUNTER — Other Ambulatory Visit: Payer: Self-pay

## 2019-02-18 DIAGNOSIS — E291 Testicular hypofunction: Secondary | ICD-10-CM

## 2019-02-18 LAB — NUCLEOTIDASE, 5', BLOOD: 5-Nucleotidase: 11 IU/L — ABNORMAL HIGH (ref 0–10)

## 2019-02-18 LAB — SPECIMEN STATUS REPORT

## 2019-02-18 LAB — GAMMA GT: GGT: 250 IU/L — ABNORMAL HIGH (ref 0–65)

## 2019-02-21 ENCOUNTER — Other Ambulatory Visit: Payer: Self-pay

## 2019-02-22 ENCOUNTER — Encounter: Payer: Self-pay | Admitting: Family Medicine

## 2019-02-22 ENCOUNTER — Ambulatory Visit: Payer: 59 | Admitting: Family Medicine

## 2019-02-22 ENCOUNTER — Other Ambulatory Visit: Payer: Self-pay

## 2019-02-22 VITALS — BP 118/71 | HR 60 | Temp 98.0°F | Ht 75.0 in | Wt 265.0 lb

## 2019-02-22 DIAGNOSIS — M7918 Myalgia, other site: Secondary | ICD-10-CM

## 2019-02-22 MED ORDER — CELECOXIB 200 MG PO CAPS: 400.0000 mg | ORAL_CAPSULE | Freq: Every day | ORAL | 0 refills | Status: DC

## 2019-02-22 NOTE — Progress Notes (Signed)
Chief Complaint  Patient presents with  . Knot on back    Burning    HPI  Patient presents today for several days of a burning sensation in the back. He points to the area between the shoulder blades to the right of the midline.   PMH: Smoking status noted ROS: Per HPI  Objective: BP 118/71   Pulse 60   Temp 98 F (36.7 C) (Temporal)   Ht 6\' 3"  (1.905 m)   Wt 265 lb (120.2 kg)   BMI 33.12 kg/m  Gen: NAD, alert, cooperative with exam Ext: No edema, warm. No lesion visible rhomboidius spasm is palpable to the right of midline at atr the level of rib 6-8 approximately. The area is mildly tender Neuro: Alert and oriented, No gross deficits  Assessment and plan:  1. Rhomboid muscle pain     Meds ordered this encounter  Medications  . celecoxib (CELEBREX) 200 MG capsule    Sig: Take 2 capsules (400 mg total) by mouth daily for 15 days. With food    Dispense:  30 capsule    Refill:  0    No orders of the defined types were placed in this encounter.   Follow up as needed.  , MD

## 2019-02-23 ENCOUNTER — Encounter: Payer: Self-pay | Admitting: Family Medicine

## 2019-02-23 ENCOUNTER — Telehealth: Payer: Self-pay | Admitting: Family Medicine

## 2019-02-23 NOTE — Telephone Encounter (Signed)
Pt called stating that he recently saw Dr Darlyn Read regarding his issue and says he needs Dr Darlyn Read to write him a work note so that he can be out for 2 days. 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 Says he did pick up his Rx last night and just needs to be out of work long enough for the medicine to kick in and he can go back to work.

## 2019-02-23 NOTE — Telephone Encounter (Signed)
Patient aware.

## 2019-02-23 NOTE — Telephone Encounter (Signed)
I put a letter in the chart can be printed for pt.

## 2019-02-26 ENCOUNTER — Encounter: Payer: Self-pay | Admitting: Family Medicine

## 2019-03-22 ENCOUNTER — Other Ambulatory Visit: Payer: Self-pay | Admitting: Family Medicine

## 2019-05-02 ENCOUNTER — Telehealth: Payer: Self-pay | Admitting: Family Medicine

## 2019-05-02 NOTE — Telephone Encounter (Signed)
Left message to call back  

## 2019-05-03 NOTE — Telephone Encounter (Signed)
Patient has a follow up appointment scheduled. 

## 2019-05-06 NOTE — Telephone Encounter (Signed)
Appointment in schedule on 05/09/2019 with Dr. Darlyn Read.

## 2019-05-09 ENCOUNTER — Encounter: Payer: Self-pay | Admitting: Family Medicine

## 2019-05-09 ENCOUNTER — Ambulatory Visit (INDEPENDENT_AMBULATORY_CARE_PROVIDER_SITE_OTHER): Payer: 59 | Admitting: Family Medicine

## 2019-05-09 DIAGNOSIS — M170 Bilateral primary osteoarthritis of knee: Secondary | ICD-10-CM

## 2019-05-09 MED ORDER — PREDNISONE 10 MG PO TABS: ORAL_TABLET | ORAL | 0 refills | Status: DC

## 2019-05-09 NOTE — Progress Notes (Signed)
    Subjective:    Patient ID: Luke Davila, male    DOB: [birth year], 54 y.o.   MRN: [id number]   HPI: Luke Davila is a 54 y.o. male presenting for my knees are killing me. Prednisone helped last time. Using heat for partial relief. Has known arthritis in the knees. Would like prednisone again.  This is interfering with his ability to ambulate.  He denies any side effects in the past from prednisone.  Specifically no sleep disturbance no nausea and no swelling.   Depression screen [facility] 2/9 02/22/2019 02/14/2019 07/15/2018 03/19/2018 03/03/2017  Decreased Interest 0 0 0 0 1  Down, Depressed, Hopeless 0 0 0 0 0  PHQ - 2 Score 0 0 0 0 1     Relevant past medical, surgical, family and social history reviewed and updated as indicated.  Interim medical history since our last visit reviewed. Allergies and medications reviewed and updated.  ROS:  Review of Systems   Social History   Tobacco Use  Smoking Status Never Smoker  Smokeless Tobacco Never Used       Objective:     Wt Readings from Last 3 Encounters:  02/22/19 265 lb (120.2 kg)  02/14/19 267 lb 12.8 oz (121.5 kg)  07/15/18 271 lb (122.9 kg)     Exam deferred. Pt. Harboring due to COVID 19. Phone visit performed.   Assessment & Plan:   1. Bilateral primary osteoarthritis of knee     Meds ordered this encounter  Medications  . predniSONE (DELTASONE) 10 MG tablet    Sig: Take 5 daily for 3 days followed by 4,3,2 and 1 for 3 days each.    Dispense:  45 tablet    Refill:  0    No orders of the defined types were placed in this encounter.     Diagnoses and all orders for this visit:  Bilateral primary osteoarthritis of knee  Other orders -     predniSONE (DELTASONE) 10 MG tablet; Take 5 daily for 3 days followed by 4,3,2 and 1 for 3 days each.    Virtual Visit via telephone Note  I discussed the limitations, risks, security and privacy concerns of performing an evaluation and management service by telephone  and the availability of in person appointments. The patient was identified with two identifiers. Pt.expressed understanding and agreed to proceed. Pt. Is at home. Dr. Darlyn Read is in his office.  Follow Up Instructions:   I discussed the assessment and treatment plan with the patient. The patient was provided an opportunity to ask questions and all were answered. The patient agreed with the plan and demonstrated an understanding of the instructions.   The patient was advised to call back or seek an in-person evaluation if the symptoms worsen or if the condition fails to improve as anticipated.   Total minutes including chart review and phone contact time: 14   Follow up plan: Return if symptoms worsen or fail to improve.  Mechele Claude, MD [facility]

## 2019-06-20 ENCOUNTER — Telehealth: Payer: Self-pay

## 2019-06-20 NOTE — Telephone Encounter (Signed)
Patient states that the last week he has been having SOB when walking and a feeling in his chest.  States it is not pain but it feels different.  Patient states he also has been having a cough.  Advised patient to go to urgent care to get seen and patient states he would go now.

## 2019-07-14 ENCOUNTER — Telehealth: Payer: Self-pay

## 2019-07-14 ENCOUNTER — Other Ambulatory Visit: Payer: Self-pay

## 2019-07-14 ENCOUNTER — Encounter: Payer: Self-pay | Admitting: Family Medicine

## 2019-07-14 ENCOUNTER — Ambulatory Visit (INDEPENDENT_AMBULATORY_CARE_PROVIDER_SITE_OTHER): Payer: 59 | Admitting: Family Medicine

## 2019-07-14 ENCOUNTER — Ambulatory Visit (HOSPITAL_COMMUNITY)
Admission: RE | Admit: 2019-07-14 | Discharge: 2019-07-14 | Disposition: A | Discharge status: Home / Self Care | Payer: 59 | Source: Ambulatory Visit | Attending: Family Medicine | Admitting: Family Medicine

## 2019-07-14 DIAGNOSIS — R05 Cough: Secondary | ICD-10-CM | POA: Diagnosis present

## 2019-07-14 DIAGNOSIS — R062 Wheezing: Secondary | ICD-10-CM

## 2019-07-14 DIAGNOSIS — R059 Cough, unspecified: Secondary | ICD-10-CM

## 2019-07-14 DIAGNOSIS — R0602 Shortness of breath: Secondary | ICD-10-CM | POA: Insufficient documentation

## 2019-07-14 NOTE — Progress Notes (Signed)
   Virtual Visit via Telephone Note  I connected with Luke Davila on 07/14/19 at 1:17 PM by telephone and verified that I am speaking with the correct person using two identifiers. Luke Davila is currently located at work and nobody is currently with him during this visit. The provider, Gwenlyn Fudge, FNP is located in their office at time of visit.  I discussed the limitations, risks, security and privacy concerns of performing an evaluation and management service by telephone and the availability of in person appointments. I also discussed with the patient that there may be a patient responsible charge related to this service. The patient expressed understanding and agreed to proceed.  Subjective: PCP: Mechele Claude, MD  Chief Complaint  Patient presents with  . URI   Patient complains of cough, chest congestion, shortness of breath and wheezing. Onset of symptoms was 2 months ago, unchanged since that time. He is drinking plenty of fluids. Evaluation to date: Patient was seen at urgent care on 06/20/2019 at which time he was given antibiotics and steroids. Treatment to date: antibiotics and steroids.  He does not feel these improved his symptoms. He does not smoke.    ROS: Per HPI  Current Outpatient Medications:  .  atorvastatin (LIPITOR) 40 MG tablet, Take 1 tablet (40 mg total) by mouth daily. For cholesterol, Disp: 90 tablet, Rfl: 3 .  augmented betamethasone dipropionate (DIPROLENE-AF) 0.05 % cream, Apply topically 2 (two) times daily. At affected areas (avoid face and genitals), Disp: 50 g, Rfl: 1 .  tamsulosin (FLOMAX) 0.4 MG CAPS capsule, Take 2 capsules (0.8 mg total) by mouth daily after supper., Disp: 60 capsule, Rfl: 5 .  Testosterone 30 MG/ACT SOLN, 1 Applicator-ful under each arm daily, Disp: 90 mL, Rfl: 5 .  zolpidem (AMBIEN) 5 MG tablet, Take 1 tablet (5 mg total) by mouth at bedtime as needed for sleep., Disp: 30 tablet, Rfl: 2  No Known Allergies Past Medical  History:  Diagnosis Date  . Avascular necrosis of hip (HCC)    LEFT  . Enlarged prostate   . Glaucoma   . Gout   . History of gout     Observations/Objective: A&O  No respiratory distress or wheezing audible over the phone Mood, judgement, and thought processes all WNL  Assessment and Plan: 1-3. Shortness of breath/Wheezing/Cough - CXR ordered first.  Advised patient we will determine next steps after we get his chest x-ray results. - DG Chest 2 View; Future   Follow Up Instructions:  I discussed the assessment and treatment plan with the patient. The patient was provided an opportunity to ask questions and all were answered. The patient agreed with the plan and demonstrated an understanding of the instructions.   The patient was advised to call back or seek an in-person evaluation if the symptoms worsen or if the condition fails to improve as anticipated.  The above assessment and management plan was discussed with the patient. The patient verbalized understanding of and has agreed to the management plan. Patient is aware to call the clinic if symptoms persist or worsen. Patient is aware when to return to the clinic for a follow-up visit. Patient educated on when it is appropriate to go to the emergency department.   Time call ended: 1:28 PM  I provided 13 minutes of non-face-to-face time during this encounter.  Deliah Boston, MSN, APRN, FNP-C [facility] 07/14/19

## 2019-07-14 NOTE — Telephone Encounter (Signed)
Patient needing to know where to go for CXR at [facility] - called patient left message with information on where at the hospital to go for x-ray - advised if any questions to please CB

## 2019-07-15 ENCOUNTER — Telehealth: Payer: Self-pay | Admitting: Family Medicine

## 2019-07-15 ENCOUNTER — Other Ambulatory Visit: Payer: Self-pay | Admitting: Family Medicine

## 2019-07-15 DIAGNOSIS — J189 Pneumonia, unspecified organism: Secondary | ICD-10-CM

## 2019-07-15 MED ORDER — DOXYCYCLINE HYCLATE 100 MG PO TABS: 100.0000 mg | ORAL_TABLET | Freq: Two times a day (BID) | ORAL | 0 refills | Status: AC

## 2019-07-15 NOTE — Telephone Encounter (Signed)
Please review results.

## 2019-07-16 NOTE — Telephone Encounter (Signed)
Discussed with patient

## 2019-07-18 ENCOUNTER — Other Ambulatory Visit: Payer: Self-pay | Admitting: Family Medicine

## 2019-07-18 ENCOUNTER — Telehealth: Payer: Self-pay | Admitting: Family Medicine

## 2019-07-18 MED ORDER — CELECOXIB 200 MG PO CAPS: 400.0000 mg | ORAL_CAPSULE | Freq: Every day | ORAL | 0 refills | Status: AC

## 2019-07-18 NOTE — Telephone Encounter (Signed)
Patient aware and verbalized understanding. °

## 2019-07-18 NOTE — Telephone Encounter (Signed)
Yes it is okay. I renewed the celebrex too.

## 2019-07-18 NOTE — Telephone Encounter (Signed)
Celebrex is not on med list- please advise

## 2019-08-15 ENCOUNTER — Encounter: Payer: Self-pay | Admitting: Family Medicine

## 2019-08-15 ENCOUNTER — Ambulatory Visit (INDEPENDENT_AMBULATORY_CARE_PROVIDER_SITE_OTHER): Payer: 59 | Admitting: Family Medicine

## 2019-08-15 ENCOUNTER — Other Ambulatory Visit: Payer: Self-pay

## 2019-08-15 ENCOUNTER — Ambulatory Visit (INDEPENDENT_AMBULATORY_CARE_PROVIDER_SITE_OTHER): Payer: 59

## 2019-08-15 VITALS — BP 108/60 | HR 56 | Temp 97.1°F | Ht 75.0 in | Wt 251.8 lb

## 2019-08-15 DIAGNOSIS — R0602 Shortness of breath: Secondary | ICD-10-CM | POA: Diagnosis not present

## 2019-08-15 DIAGNOSIS — F5101 Primary insomnia: Secondary | ICD-10-CM | POA: Diagnosis not present

## 2019-08-15 DIAGNOSIS — M791 Myalgia, unspecified site: Secondary | ICD-10-CM | POA: Diagnosis not present

## 2019-08-15 DIAGNOSIS — E291 Testicular hypofunction: Secondary | ICD-10-CM | POA: Diagnosis not present

## 2019-08-15 DIAGNOSIS — R918 Other nonspecific abnormal finding of lung field: Secondary | ICD-10-CM

## 2019-08-15 DIAGNOSIS — N401 Enlarged prostate with lower urinary tract symptoms: Secondary | ICD-10-CM

## 2019-08-15 DIAGNOSIS — R911 Solitary pulmonary nodule: Secondary | ICD-10-CM

## 2019-08-15 DIAGNOSIS — R351 Nocturia: Secondary | ICD-10-CM

## 2019-08-15 MED ORDER — LEVOFLOXACIN 500 MG PO TABS
500.0000 mg | ORAL_TABLET | Freq: Every day | ORAL | 0 refills | Status: DC
Start: 2019-08-15 — End: 2019-09-06

## 2019-08-15 MED ORDER — BETAMETHASONE SOD PHOS & ACET 6 (3-3) MG/ML IJ SUSP: 6.0000 mg | Freq: Once | INTRAMUSCULAR | Status: DC

## 2019-08-15 MED ORDER — ZOLPIDEM TARTRATE 5 MG PO TABS: 5.0000 mg | ORAL_TABLET | Freq: Every evening | ORAL | 2 refills | Status: DC | PRN

## 2019-08-15 MED ORDER — TAMSULOSIN HCL 0.4 MG PO CAPS: 0.8000 mg | ORAL_CAPSULE | Freq: Every day | ORAL | 5 refills | Status: DC

## 2019-08-15 NOTE — Progress Notes (Signed)
Subjective:  Patient ID: Luke Davila, male    DOB: 04-10-1965  Age: 54 y.o. MRN: [id number]  CC: Follow-up   HPI Ranald Alessio presents for cough and dyspnea.  This is been ongoing for about 4 months.  He has had multiple treatments.  Nothing seems to have really helped.  About a month ago he had a Covid test negative at urgent care.  He has had antibiotics.  He gets short of breath when he tries to do any type of activity but he is having to continue working.  He continues to have tightening of the thighs similar to when he was evaluated last.  Of note is that he has run out of his Ambien and is no longer sleeping well. Follow up for testosterone deficiency: His energy level is fair to poor due to the shortness of breath and perhaps the testosterone.  Of note is that he never came back for his confirmation blood test last time so he has been doing without his medicine again.  Depression screen [facility] 2/9 08/15/2019 08/15/2019 02/22/2019  Decreased Interest 0 0 0  Down, Depressed, Hopeless 0 0 0  PHQ - 2 Score 0 0 0    History Rubin has a past medical history of Avascular necrosis of hip ([place]), Enlarged prostate, Glaucoma, Gout, and History of gout.   He has a past surgical history that includes Foot surgery (Left, 1985); Left hand surgery; Total hip arthroplasty (Left, 04/09/2015); and Colonoscopy with propofol (N/A, 11/27/2015).   His family history includes Hypertension in his mother.He reports that he has never smoked. He has never used smokeless tobacco. He reports current alcohol use. He reports that he does not use drugs.    ROS Review of Systems  Constitutional: Positive for activity change.  HENT: Positive for congestion.   Eyes: Negative for visual disturbance.  Respiratory: Positive for cough and shortness of breath.   Cardiovascular: Negative for chest pain and leg swelling.  Gastrointestinal: Negative for abdominal pain, diarrhea, nausea and vomiting.  Genitourinary: Negative  for difficulty urinating.  Musculoskeletal: Positive for myalgias (thighs). Negative for arthralgias.  Skin: Negative for rash.  Neurological: Negative for headaches.  Psychiatric/Behavioral: Positive for sleep disturbance.    Objective:  BP 108/60   Pulse (!) 56   Temp (!) 97.1 F (36.2 C) (Temporal)   Ht '6\' 3"'  (1.905 m)   Wt 251 lb 12.8 oz (114.2 kg)   BMI 31.47 kg/m   BP Readings from Last 3 Encounters:  08/15/19 108/60  02/22/19 118/71  02/14/19 126/82    Wt Readings from Last 3 Encounters:  08/15/19 251 lb 12.8 oz (114.2 kg)  02/22/19 265 lb (120.2 kg)  02/14/19 267 lb 12.8 oz (121.5 kg)     Physical Exam Vitals reviewed.  Constitutional:      Appearance: He is well-developed.  HENT:     Head: Normocephalic and atraumatic.     Right Ear: External ear normal.     Left Ear: External ear normal.     Mouth/Throat:     Pharynx: No oropharyngeal exudate or posterior oropharyngeal erythema.  Eyes:     Pupils: Pupils are equal, round, and reactive to light.  Cardiovascular:     Rate and Rhythm: Normal rate and regular rhythm.     Heart sounds: No murmur heard.   Pulmonary:     Effort: No respiratory distress.     Breath sounds: Normal breath sounds.  Musculoskeletal:     Cervical back: Normal range  of motion and neck supple.  Neurological:     Mental Status: He is alert and oriented to person, place, and time.    Chest x-ray: Unchanged airspace disease within the left upper lobe and possible suprahilar mass.  CT scan recommended.   Assessment & Plan:   Andras was seen today for follow-up.  Diagnoses and all orders for this visit:  Shortness of breath -     Cancel: DG Chest 2 View; Future -     Cancel: DG Chest 2 View -     DG Chest 2 View -     CBC with Differential/Platelet -     CMP14+EGFR -     betamethasone acetate-betamethasone sodium phosphate (CELESTONE) injection 6 mg  Hypogonadism in male -     CBC with Differential/Platelet -      CMP14+EGFR -     Testosterone,Free and Total  Myalgia -     CBC with Differential/Platelet -     CMP14+EGFR  Primary insomnia -     CBC with Differential/Platelet -     CMP14+EGFR -     zolpidem (AMBIEN) 5 MG tablet; Take 1 tablet (5 mg total) by mouth at bedtime as needed for sleep.  Benign prostatic hyperplasia with nocturia -     CBC with Differential/Platelet -     CMP14+EGFR -     tamsulosin (FLOMAX) 0.4 MG CAPS capsule; Take 2 capsules (0.8 mg total) by mouth daily after supper.  Mass of upper lobe of left lung  Solitary pulmonary nodule -     CT CHEST W CONTRAST; Future  Other orders -     levofloxacin (LEVAQUIN) 500 MG tablet; Take 1 tablet (500 mg total) by mouth daily. For 10 days       I have discontinued Roemello Dewberry's atorvastatin. I am also having him start on levofloxacin. Additionally, I am having him maintain his Testosterone, augmented betamethasone dipropionate, zolpidem, and tamsulosin. We will continue to administer betamethasone acetate-betamethasone sodium phosphate.  Allergies as of 08/15/2019   No Known Allergies     Medication List       Accurate as of August 15, 2019 10:32 PM. If you have any questions, ask your nurse or doctor.        STOP taking these medications   atorvastatin 40 MG tablet Commonly known as: LIPITOR Stopped by: Claretta Fraise, MD     TAKE these medications   augmented betamethasone dipropionate 0.05 % cream Commonly known as: DIPROLENE-AF Apply topically 2 (two) times daily. At affected areas (avoid face and genitals)   levofloxacin 500 MG tablet Commonly known as: LEVAQUIN Take 1 tablet (500 mg total) by mouth daily. For 10 days Started by: Claretta Fraise, MD   tamsulosin 0.4 MG Caps capsule Commonly known as: FLOMAX Take 2 capsules (0.8 mg total) by mouth daily after supper.   Testosterone 30 MG/ACT Soln 1 Applicator-ful under each arm daily   zolpidem 5 MG tablet Commonly known as: AMBIEN Take 1 tablet  (5 mg total) by mouth at bedtime as needed for sleep.        Follow-up: Return in about 1 week (around 08/22/2019).  Claretta Fraise, M.D.

## 2019-08-16 ENCOUNTER — Telehealth: Payer: Self-pay | Admitting: Family Medicine

## 2019-08-16 ENCOUNTER — Other Ambulatory Visit: Payer: Self-pay | Admitting: Family Medicine

## 2019-08-16 ENCOUNTER — Other Ambulatory Visit: Payer: 59

## 2019-08-16 DIAGNOSIS — L309 Dermatitis, unspecified: Secondary | ICD-10-CM

## 2019-08-16 MED ORDER — ALPRAZOLAM 1 MG PO TABS: ORAL_TABLET | ORAL | 0 refills | Status: DC

## 2019-08-16 NOTE — Telephone Encounter (Signed)
Pt returned call regarding scheduling for CT scan. Pt says if it is a confined space that he has to be in, then he can't do it. Wants to know if there are other options.

## 2019-08-16 NOTE — Telephone Encounter (Signed)
I explained the est to the patient and he is willing to try. I sent in xanax for him to use as a sedative pre-procedure. Also, if possible please schedule his CT with an open scanner. Thanks

## 2019-08-16 NOTE — Telephone Encounter (Signed)
Pt aware that Xanax has been called in and instructed on how to take.

## 2019-08-17 ENCOUNTER — Telehealth: Payer: Self-pay | Admitting: Family Medicine

## 2019-08-17 ENCOUNTER — Other Ambulatory Visit: Payer: Self-pay

## 2019-08-17 ENCOUNTER — Ambulatory Visit (HOSPITAL_COMMUNITY)
Admission: RE | Admit: 2019-08-17 | Discharge: 2019-08-17 | Disposition: A | Discharge status: Home / Self Care | Payer: 59 | Source: Ambulatory Visit | Attending: Family Medicine | Admitting: Family Medicine

## 2019-08-17 DIAGNOSIS — R911 Solitary pulmonary nodule: Secondary | ICD-10-CM | POA: Diagnosis present

## 2019-08-17 MED ORDER — IOHEXOL 300 MG/ML  SOLN
75.0000 mL | Freq: Once | INTRAMUSCULAR | Status: AC | PRN
  Administered 2019-08-17: 75 mL via INTRAVENOUS

## 2019-08-17 NOTE — Telephone Encounter (Signed)
In result notes.  

## 2019-08-18 ENCOUNTER — Other Ambulatory Visit: Payer: 59

## 2019-08-18 ENCOUNTER — Other Ambulatory Visit: Payer: Self-pay

## 2019-08-18 DIAGNOSIS — E291 Testicular hypofunction: Secondary | ICD-10-CM

## 2019-08-18 DIAGNOSIS — E7800 Pure hypercholesterolemia, unspecified: Secondary | ICD-10-CM

## 2019-08-19 LAB — CBC WITH DIFFERENTIAL/PLATELET
Basophils Absolute: 0 10*3/uL (ref 0.0–0.2)
Basos: 0 %
EOS (ABSOLUTE): 0 10*3/uL (ref 0.0–0.4)
Eos: 0 %
Hematocrit: 35.5 % — ABNORMAL LOW (ref 37.5–51.0)
Hemoglobin: 11.4 g/dL — ABNORMAL LOW (ref 13.0–17.7)
Immature Grans (Abs): 0 10*3/uL (ref 0.0–0.1)
Immature Granulocytes: 0 %
Lymphocytes Absolute: 1 10*3/uL (ref 0.7–3.1)
Lymphs: 18 %
MCH: 25.3 pg — ABNORMAL LOW (ref 26.6–33.0)
MCHC: 32.1 g/dL (ref 31.5–35.7)
MCV: 79 fL (ref 79–97)
Monocytes Absolute: 0.4 10*3/uL (ref 0.1–0.9)
Monocytes: 8 %
Neutrophils Absolute: 3.8 10*3/uL (ref 1.4–7.0)
Neutrophils: 74 %
Platelets: 278 10*3/uL (ref 150–450)
RBC: 4.5 x10E6/uL (ref 4.14–5.80)
RDW: 12.7 % (ref 11.6–15.4)
WBC: 5.3 10*3/uL (ref 3.4–10.8)

## 2019-08-19 LAB — CMP14+EGFR
ALT: 36 IU/L (ref 0–44)
AST: 42 IU/L — ABNORMAL HIGH (ref 0–40)
Albumin/Globulin Ratio: 1.3 (ref 1.2–2.2)
Albumin: 4.5 g/dL (ref 3.8–4.9)
Alkaline Phosphatase: 281 IU/L — ABNORMAL HIGH (ref 48–121)
BUN/Creatinine Ratio: 12 (ref 9–20)
BUN: 12 mg/dL (ref 6–24)
Bilirubin Total: 0.4 mg/dL (ref 0.0–1.2)
CO2: 21 mmol/L (ref 20–29)
Calcium: 9.9 mg/dL (ref 8.7–10.2)
Chloride: 103 mmol/L (ref 96–106)
Creatinine, Ser: 0.98 mg/dL (ref 0.76–1.27)
GFR calc Af Amer: 101 mL/min/{1.73_m2} (ref >59)
GFR calc non Af Amer: 88 mL/min/{1.73_m2} (ref >59)
Globulin, Total: 3.4 g/dL (ref 1.5–4.5)
Glucose: 124 mg/dL — ABNORMAL HIGH (ref 65–99)
Potassium: 4.3 mmol/L (ref 3.5–5.2)
Sodium: 139 mmol/L (ref 134–144)
Total Protein: 7.9 g/dL (ref 6.0–8.5)

## 2019-08-19 LAB — TESTOSTERONE,FREE AND TOTAL
Testosterone, Free: <0.2 pg/mL — ABNORMAL LOW (ref 7.2–24.0)
Testosterone: <3 ng/dL — ABNORMAL LOW (ref 264–916)

## 2019-08-22 LAB — TESTOSTERONE,FREE AND TOTAL
Testosterone, Free: <0.2 pg/mL — ABNORMAL LOW (ref 7.2–24.0)
Testosterone: <3 ng/dL — ABNORMAL LOW (ref 264–916)

## 2019-08-23 ENCOUNTER — Telehealth: Payer: Self-pay | Admitting: Family Medicine

## 2019-08-23 NOTE — Telephone Encounter (Signed)
Patient does not need to come in at this time.  Lipid panel added to blood work from 08/18/19

## 2019-08-24 LAB — LIPID PANEL
Chol/HDL Ratio: 4.2 ratio (ref 0.0–5.0)
Cholesterol, Total: 159 mg/dL (ref 100–199)
HDL: 38 mg/dL — ABNORMAL LOW (ref >39)
LDL Chol Calc (NIH): 101 mg/dL — ABNORMAL HIGH (ref 0–99)
Triglycerides: 109 mg/dL (ref 0–149)
VLDL Cholesterol Cal: 20 mg/dL (ref 5–40)

## 2019-08-24 LAB — SPECIMEN STATUS REPORT

## 2019-08-25 ENCOUNTER — Encounter: Payer: Self-pay | Admitting: Family Medicine

## 2019-08-25 ENCOUNTER — Ambulatory Visit (INDEPENDENT_AMBULATORY_CARE_PROVIDER_SITE_OTHER): Payer: 59 | Admitting: Family Medicine

## 2019-08-25 ENCOUNTER — Telehealth: Payer: Self-pay

## 2019-08-25 DIAGNOSIS — F419 Anxiety disorder, unspecified: Secondary | ICD-10-CM | POA: Diagnosis not present

## 2019-08-25 DIAGNOSIS — D869 Sarcoidosis, unspecified: Secondary | ICD-10-CM

## 2019-08-25 DIAGNOSIS — E291 Testicular hypofunction: Secondary | ICD-10-CM | POA: Diagnosis not present

## 2019-08-25 MED ORDER — TESTOSTERONE CYPIONATE 200 MG/ML IM SOLN: 200.0000 mg | INTRAMUSCULAR | 0 refills | Status: DC

## 2019-08-25 MED ORDER — BUSPIRONE HCL 5 MG PO TABS: 5.0000 mg | ORAL_TABLET | Freq: Two times a day (BID) | ORAL | 2 refills | Status: AC

## 2019-08-25 NOTE — Telephone Encounter (Signed)
Testosterone Cypionate 200MG /ML intramuscular solution  Key: - PA Case ID: [id number] - Rx #[id number]  PA started

## 2019-08-25 NOTE — Progress Notes (Signed)
Subjective:  Patient ID: Luke Davila, male    DOB: January 17, 1965  Age: 54 y.o. MRN: [id number]  CC: No chief complaint on file.   HPI Luke Davila presents for recheck of his pneumonia.  The CT performed last week was reviewed with the patient and he notes that he has been in the past told that he had sarcoidosis in his eye.  This makes the likelihood of the findings on the CT scan much more likely to be sarcoidosis related.  Particularly since they had found the risk of malignancy being fairly low.  At this time he denies any shortness of breath.  His cough is getting much better.  He is not interested in further treatment.  He says he cannot see out of the eye and there was nothing that they had after several trips to physicians at [facility] so at this time he is not interested in treatment.  Patient noted that his testosterone was very low.  I confirmed this by reviewing his lab test with him.  His energy is low.  He would like to start on a supplement.  Instead of the gels etc. ago he would like to try the testosterone injection.  That will be ordered today.  Patient is concerned about his stress level.  He relates his back to health as well as job stress.  He went back to work today because things were "a mess and he says that there have been from people here who just make it difficult for him to get his work done apparently.  He said he did not want to go into the details of that.   Depression screen [facility] 2/9 08/15/2019 08/15/2019 02/22/2019  Decreased Interest 0 0 0  Down, Depressed, Hopeless 0 0 0  PHQ - 2 Score 0 0 0    History Luke Davila has a past medical history of Avascular necrosis of hip (HCC), Enlarged prostate, Glaucoma, Gout, and History of gout.   He has a past surgical history that includes Foot surgery (Left, 1985); Left hand surgery; Total hip arthroplasty (Left, 04/09/2015); and Colonoscopy with propofol (N/A, 11/27/2015).   His family history includes Hypertension  in his mother.He reports that he has never smoked. He has never used smokeless tobacco. He reports current alcohol use. He reports that he does not use drugs.    ROS Review of Systems  Constitutional: Negative for fever.  Respiratory: Negative for shortness of breath.   Cardiovascular: Negative for chest pain.  Musculoskeletal: Negative for arthralgias.  Skin: Negative for rash.  Psychiatric/Behavioral: Positive for sleep disturbance. The patient is nervous/anxious.     Objective:  There were no vitals taken for this visit.  BP Readings from Last 3 Encounters:  08/15/19 108/60  02/22/19 118/71  02/14/19 126/82    Wt Readings from Last 3 Encounters:  08/15/19 251 lb 12.8 oz (114.2 kg)  02/22/19 265 lb (120.2 kg)  02/14/19 267 lb 12.8 oz (121.5 kg)     Physical Exam  Exam deferred. Pt. Harboring due to COVID 19. Phone visit performed.   Assessment & Plan:   Diagnoses and all orders for this visit:  Hypogonadism in male -     testosterone cypionate (DEPOTESTOSTERONE CYPIONATE) 200 MG/ML injection; Inject 1 mL (200 mg total) into the muscle every 14 (fourteen) days.  Sarcoidosis  Anxiety -     busPIRone (BUSPAR) 5 MG tablet; Take 1 tablet (5 mg total) by mouth 2 (two) times daily.  I have discontinued Luke Davila's Testosterone and ALPRAZolam. I am also having him start on busPIRone and testosterone cypionate. Additionally, I am having him maintain his augmented betamethasone dipropionate, zolpidem, tamsulosin, and levofloxacin. We will continue to administer betamethasone acetate-betamethasone sodium phosphate.  Allergies as of 08/25/2019   No Known Allergies     Medication List       Accurate as of August 25, 2019  9:07 AM. If you have any questions, ask your nurse or doctor.        STOP taking these medications   ALPRAZolam 1 MG tablet Commonly known as: XANAX Stopped by: Mechele Claude, MD   Testosterone 30 MG/ACT Soln Stopped by: Mechele Claude, MD     TAKE these medications   augmented betamethasone dipropionate 0.05 % cream Commonly known as: DIPROLENE-AF Apply topically 2 (two) times daily. At affected areas (avoid face and genitals)   busPIRone 5 MG tablet Commonly known as: BUSPAR Take 1 tablet (5 mg total) by mouth 2 (two) times daily. Started by: Mechele Claude, MD   levofloxacin 500 MG tablet Commonly known as: LEVAQUIN Take 1 tablet (500 mg total) by mouth daily. For 10 days   tamsulosin 0.4 MG Caps capsule Commonly known as: FLOMAX Take 2 capsules (0.8 mg total) by mouth daily after supper.   testosterone cypionate 200 MG/ML injection Commonly known as: DEPOTESTOSTERONE CYPIONATE Inject 1 mL (200 mg total) into the muscle every 14 (fourteen) days. Started by: Mechele Claude, MD   zolpidem 5 MG tablet Commonly known as: AMBIEN Take 1 tablet (5 mg total) by mouth at bedtime as needed for sleep.      Results for orders placed or performed in visit on 08/18/19  Testosterone,Free and Total  Result Value Ref Range   Testosterone <3 (L) 264 - 916 ng/dL   Testosterone, Free <0.8 (L) 7.2 - 24.0 pg/mL  Lipid panel  Result Value Ref Range   Cholesterol, Total 159 100 - 199 mg/dL   Triglycerides 676 0 - 149 mg/dL   HDL 38 (L) >19 mg/dL   VLDL Cholesterol Cal 20 5 - 40 mg/dL   LDL Chol Calc (NIH) 509 (H) 0 - 99 mg/dL   Chol/HDL Ratio 4.2 0.0 - 5.0 ratio  Specimen status report  Result Value Ref Range   specimen status report Comment    Lipid profile also reviewed.  Patient to continue medication as is. Virtual Visit via telephone Note  I discussed the limitations, risks, security and privacy concerns of performing an evaluation and management service by telephone and the availability of in person appointments. I also discussed with the patient that there may be a patient responsible charge related to this service. The patient expressed understanding and agreed to proceed. Pt. Is at home. Dr. Darlyn Read is  in his office.  Follow Up Instructions:   I discussed the assessment and treatment plan with the patient. The patient was provided an opportunity to ask questions and all were answered. The patient agreed with the plan and demonstrated an understanding of the instructions.   The patient was advised to call back or seek an in-person evaluation if the symptoms worsen or if the condition fails to improve as anticipated.  Total minutes including chart review and phone contact time: 21   Follow-up: Return in about 6 weeks (around 10/06/2019).  Mechele Claude, M.D.

## 2019-08-26 ENCOUNTER — Telehealth: Payer: Self-pay | Admitting: *Deleted

## 2019-08-26 NOTE — Telephone Encounter (Signed)
PA FOR TESTOSTERONE INJ DENIED

## 2019-08-27 NOTE — Telephone Encounter (Signed)
Please appeal. Need to know what the grounds for denial were. Thanks

## 2019-08-29 NOTE — Telephone Encounter (Signed)
I can not find the fax/report. Can you help?

## 2019-08-29 NOTE — Telephone Encounter (Signed)
No reason was given on sheet / letter - I have attempted to re-start PA

## 2019-08-29 NOTE — Telephone Encounter (Signed)
Denied on August 19 Request Reference Number: [id number]. TESTOST CYP INJ 200MG /ML is denied for not meeting the prior authorization requirement(s). Details of this decision are in the notice attached below or have been faxed to you. Appeals are not supported through ePA. Please refer to the fax case notice for appeals information and instructions

## 2019-08-30 NOTE — Telephone Encounter (Signed)
Approved today Request Reference Number: [id number]. TESTOST CYP INJ 200MG /ML is approved through 08/29/2020. Your patient may now fill this prescription and it will be covered  [facility] aware

## 2019-08-30 NOTE — Telephone Encounter (Signed)
PA has been resent for testosterone Key: [id number] - PA Case ID: [id number]

## 2019-09-03 ENCOUNTER — Other Ambulatory Visit: Payer: Self-pay | Admitting: Family Medicine

## 2019-09-03 DIAGNOSIS — E782 Mixed hyperlipidemia: Secondary | ICD-10-CM

## 2019-09-06 ENCOUNTER — Other Ambulatory Visit: Payer: Self-pay | Admitting: Family Medicine

## 2019-09-06 ENCOUNTER — Telehealth: Payer: Self-pay | Admitting: Family Medicine

## 2019-09-06 MED ORDER — PREDNISONE 10 MG PO TABS
ORAL_TABLET | ORAL | 0 refills | Status: DC
Start: 2019-09-06 — End: 2019-09-29

## 2019-09-06 MED ORDER — LEVOFLOXACIN 500 MG PO TABS: 500.0000 mg | ORAL_TABLET | Freq: Every day | ORAL | 0 refills | Status: DC

## 2019-09-06 NOTE — Telephone Encounter (Signed)
Please let the patient know that I sent their prescription to their pharmacy. Thanks, WS 

## 2019-09-06 NOTE — Telephone Encounter (Signed)
Pt aware.

## 2019-09-15 ENCOUNTER — Telehealth: Payer: Self-pay | Admitting: Family Medicine

## 2019-09-19 ENCOUNTER — Ambulatory Visit: Payer: 59

## 2019-09-20 ENCOUNTER — Telehealth: Payer: Self-pay | Admitting: Family Medicine

## 2019-09-20 DIAGNOSIS — Z1152 Encounter for screening for COVID-19: Secondary | ICD-10-CM

## 2019-09-20 NOTE — Telephone Encounter (Signed)
Can you arrange for him to come in for "Parking lot "CoVID test? There is still some chance of having it even though he has been immunized

## 2019-09-20 NOTE — Telephone Encounter (Signed)
  Incoming Patient Call  09/20/2019  What symptoms do you have? Has three days left on prednisone and took all ABX and still wheezing. Had both COVID shots. Stacks mentioned about getting his lungs checked.  How long have you been sick? 3 months or longer  Have you been seen for this problem? YES  If your provider decides to give you a prescription, which pharmacy would you like for it to be sent to[facility] [place]   Patient informed that this information will be sent to the clinical staff for review and that they should receive a follow up call.

## 2019-09-20 NOTE — Telephone Encounter (Signed)
Patient informed he will need to be Covid tested per Dr. Darlyn Read.  Patient will come tomorrow.

## 2019-09-21 ENCOUNTER — Other Ambulatory Visit: Payer: Self-pay | Admitting: *Deleted

## 2019-09-21 ENCOUNTER — Ambulatory Visit: Payer: 59

## 2019-09-21 ENCOUNTER — Other Ambulatory Visit: Payer: 59

## 2019-09-21 DIAGNOSIS — Z1152 Encounter for screening for COVID-19: Secondary | ICD-10-CM

## 2019-09-24 LAB — NOVEL CORONAVIRUS, NAA: SARS-CoV-2, NAA: NOT DETECTED

## 2019-09-27 ENCOUNTER — Telehealth: Payer: Self-pay | Admitting: Family Medicine

## 2019-09-27 NOTE — Telephone Encounter (Signed)
Pt continues to have a productive cough. Some mucous. No blood. Some shortness of breath and wheezing.  Mentioned a referral to pulmonology.

## 2019-09-28 ENCOUNTER — Other Ambulatory Visit: Payer: Self-pay | Admitting: Family Medicine

## 2019-09-28 DIAGNOSIS — D869 Sarcoidosis, unspecified: Secondary | ICD-10-CM

## 2019-09-28 NOTE — Telephone Encounter (Signed)
I put in the referral. If dyspnea worsens before he is seen there, he should be seen here.

## 2019-09-29 ENCOUNTER — Ambulatory Visit (INDEPENDENT_AMBULATORY_CARE_PROVIDER_SITE_OTHER): Payer: 59 | Admitting: Family Medicine

## 2019-09-29 ENCOUNTER — Telehealth: Payer: Self-pay | Admitting: Family Medicine

## 2019-09-29 DIAGNOSIS — D860 Sarcoidosis of lung: Secondary | ICD-10-CM

## 2019-09-29 MED ORDER — ALBUTEROL SULFATE HFA 108 (90 BASE) MCG/ACT IN AERS: 2.0000 | INHALATION_SPRAY | Freq: Four times a day (QID) | RESPIRATORY_TRACT | 11 refills | Status: DC | PRN

## 2019-09-29 MED ORDER — MOXIFLOXACIN HCL 400 MG PO TABS: 400.0000 mg | ORAL_TABLET | Freq: Every day | ORAL | 0 refills | Status: DC

## 2019-09-29 MED ORDER — HYDROCODONE-HOMATROPINE 5-1.5 MG/5ML PO SYRP: 5.0000 mL | ORAL_SOLUTION | Freq: Four times a day (QID) | ORAL | 0 refills | Status: AC | PRN

## 2019-09-29 MED ORDER — PREDNISONE 20 MG PO TABS: ORAL_TABLET | ORAL | 0 refills | Status: DC

## 2019-09-29 NOTE — Progress Notes (Signed)
Subjective:    Patient ID: Luke Davila, male    DOB: 1965/11/02, 54 y.o.   MRN: [id number]   HPI: Luke Davila is a 54 y.o. male presenting for continued cough productive of yellow sputum. He is dyspneic especially with cough. Negative CoVID test at onset. Denies fever.Feeling fatigued. No loss of taste or smell.    Depression screen [facility] 2/9 08/15/2019 08/15/2019 02/22/2019 02/14/2019 07/15/2018  Decreased Interest 0 0 0 0 0  Down, Depressed, Hopeless 0 0 0 0 0  PHQ - 2 Score 0 0 0 0 0     Relevant past medical, surgical, family and social history reviewed and updated as indicated.  Interim medical history since our last visit reviewed. Allergies and medications reviewed and updated.  ROS:  Review of Systems  Constitutional: Positive for activity change and fatigue. Negative for fever.  HENT: Positive for congestion.   Respiratory: Positive for cough, chest tightness and shortness of breath.   Gastrointestinal: Negative for abdominal pain, nausea and vomiting.  Skin: Negative for rash.     Social History   Tobacco Use  Smoking Status Never Smoker  Smokeless Tobacco Never Used       Objective:     Wt Readings from Last 3 Encounters:  08/15/19 251 lb 12.8 oz (114.2 kg)  02/22/19 265 lb (120.2 kg)  02/14/19 267 lb 12.8 oz (121.5 kg)     Exam deferred. Pt. Harboring due to COVID 19. Phone visit performed.   Assessment & Plan:   1. Sarcoidosis of lung (HCC)     Meds ordered this encounter  Medications  . predniSONE (DELTASONE) 20 MG tablet    Sig: One twice a day for two weeks then one daily for 2 weeks    Dispense:  42 tablet    Refill:  0  . HYDROcodone-homatropine (HYCODAN) 5-1.5 MG/5ML syrup    Sig: Take 5 mLs by mouth every 6 (six) hours as needed for up to 5 days for cough.    Dispense:  100 mL    Refill:  0  . albuterol (VENTOLIN HFA) 108 (90 Base) MCG/ACT inhaler    Sig: Inhale 2 puffs into the lungs every 6 (six) hours as needed for wheezing or  shortness of breath.    Dispense:  1 each    Refill:  11  . moxifloxacin (AVELOX) 400 MG tablet    Sig: Take 1 tablet (400 mg total) by mouth daily. Take all of these, for infection    Dispense:  7 tablet    Refill:  0    No orders of the defined types were placed in this encounter.     Diagnoses and all orders for this visit:  Sarcoidosis of lung (HCC)  Other orders -     predniSONE (DELTASONE) 20 MG tablet; One twice a day for two weeks then one daily for 2 weeks -     HYDROcodone-homatropine (HYCODAN) 5-1.5 MG/5ML syrup; Take 5 mLs by mouth every 6 (six) hours as needed for up to 5 days for cough. -     albuterol (VENTOLIN HFA) 108 (90 Base) MCG/ACT inhaler; Inhale 2 puffs into the lungs every 6 (six) hours as needed for wheezing or shortness of breath. -     moxifloxacin (AVELOX) 400 MG tablet; Take 1 tablet (400 mg total) by mouth daily. Take all of these, for infection    Virtual Visit via telephone Note  I discussed the limitations, risks, security and privacy concerns of performing  an evaluation and management service by telephone and the availability of in person appointments. The patient was identified with two identifiers. Pt.expressed understanding and agreed to proceed. Pt. Is at home. Dr. Darlyn Read is in his office.  Follow Up Instructions:   I discussed the assessment and treatment plan with the patient. The patient was provided an opportunity to ask questions and all were answered. The patient agreed with the plan and demonstrated an understanding of the instructions.   The patient was advised to call back or seek an in-person evaluation if the symptoms worsen or if the condition fails to improve as anticipated.   Total minutes including chart review and phone contact time: 18   Follow up plan: No follow-ups on file.  Mechele Claude, MD [facility]

## 2019-09-29 NOTE — Telephone Encounter (Signed)
Spoke with patient he is wheezing and has some cough.  I scheduled a tele visit appointment this afternoon at 1:55 pm for him with Dr. Darlyn Read.

## 2019-09-29 NOTE — Telephone Encounter (Signed)
Pt called stating that he is still wheezing and would like Dr Darlyn Read to send him in an inhaler or something that would help. Pt says hes already been seen for this recently but is still having issues.

## 2019-09-30 ENCOUNTER — Encounter: Payer: Self-pay | Admitting: Family Medicine

## 2019-09-30 DIAGNOSIS — D860 Sarcoidosis of lung: Secondary | ICD-10-CM | POA: Insufficient documentation

## 2019-10-13 ENCOUNTER — Ambulatory Visit: Payer: 59

## 2019-10-19 ENCOUNTER — Other Ambulatory Visit: Payer: Self-pay

## 2019-10-19 ENCOUNTER — Encounter: Payer: Self-pay | Admitting: Family Medicine

## 2019-10-19 ENCOUNTER — Ambulatory Visit (INDEPENDENT_AMBULATORY_CARE_PROVIDER_SITE_OTHER): Payer: 59 | Admitting: Family Medicine

## 2019-10-19 VITALS — BP 125/77 | HR 86 | Temp 97.4°F | Ht 75.0 in | Wt 265.5 lb

## 2019-10-19 DIAGNOSIS — M170 Bilateral primary osteoarthritis of knee: Secondary | ICD-10-CM

## 2019-10-19 MED ORDER — DICLOFENAC SODIUM 75 MG PO TBEC: 75.0000 mg | DELAYED_RELEASE_TABLET | Freq: Two times a day (BID) | ORAL | 0 refills | Status: DC

## 2019-10-19 MED ORDER — METHYLPREDNISOLONE ACETATE 80 MG/ML IJ SUSP
80.0000 mg | Freq: Once | INTRAMUSCULAR | Status: AC
  Administered 2019-10-19: 80 mg via INTRAMUSCULAR

## 2019-10-19 NOTE — Patient Instructions (Signed)
Chronic Knee Pain, Adult Chronic knee pain is pain in one or both knees that lasts longer than 3 months. Symptoms of chronic knee pain may include swelling, stiffness, and discomfort. Age-related wear and tear (osteoarthritis) of the knee joint is the most common cause of chronic knee pain. Other possible causes include:  A long-term immune-related disease that causes inflammation of the knee (rheumatoid arthritis). This usually affects both knees.  Inflammatory arthritis, such as gout or pseudogout.  An injury to the knee that causes arthritis.  An injury to the knee that damages the ligaments. Ligaments are strong tissues that connect bones to each other.  Runner's knee or pain behind the kneecap. Treatment for chronic knee pain depends on the cause. The main treatments for chronic knee pain are physical therapy and weight loss. This condition may also be treated with medicines, injections, a knee sleeve or brace, and by using crutches. Rest, ice, compression (pressure), and elevation (RICE) therapy may also be recommended. Follow these instructions at home: If you have a knee sleeve or brace:   Wear it as told by your health care provider. Remove it only as told by your health care provider.  Loosen it if your toes tingle, become numb, or turn cold and blue.  Keep it clean.  If the sleeve or brace is not waterproof: ? Do not let it get wet. ? Remove it if allowed by your health care provider, or cover it with a watertight covering when you take a bath or a shower. Managing pain, stiffness, and swelling      If directed, apply heat to the affected area as often as told by your health care provider. Use the heat source that your health care provider recommends, such as a moist heat pack or a heating pad. ? If you have a removable sleeve or brace, remove it as told by your health care provider. ? Place a towel between your skin and the heat source. ? Leave the heat on for 20-30  minutes. ? Remove the heat if your skin turns bright red. This is especially important if you are unable to feel pain, heat, or cold. You may have a greater risk of getting burned.  If directed, put ice on the affected area. ? If you have a removable sleeve or brace, remove it as told by your health care provider. ? Put ice in a plastic bag. ? Place a towel between your skin and the bag. ? Leave the ice on for 20 minutes, 2-3 times a day.  Move your toes often to reduce stiffness and swelling.  Raise (elevate) the injured area above the level of your heart while you are sitting or lying down. Activity  Avoid activities where both feet leave the ground at the same time (high-impact activities). Examples are running, jumping rope, and doing jumping jacks.  Return to your normal activities as told by your health care provider. Ask your health care provider what activities are safe for you.  Follow the exercise plan that your health care provider designed for you. Your health care provider may suggest that you: ? Avoid activities that make knee pain worse. This may require you to change your exercise routines, sport participation, or job duties. ? Wear shoes with cushioned soles. ? Avoid high-impact activities or sports that require running and sudden changes in direction. ? Do physical therapy as told by your health care provider. Physical therapy is planned to match your needs and abilities. It may include  exercises for strength, flexibility, stability, and endurance. ? Do exercises that increase balance and strength, such as tai chi and yoga.  Do not use the injured limb to support your body weight until your health care provider says that you can. Use crutches, a cane, or a walker, as told by your health care provider. General instructions  Take over-the-counter and prescription medicines only as told by your health care provider.  Lose weight if you are overweight. Losing even a little  weight can reduce knee pain. Ask your health care provider what your ideal weight is, and how to safely lose extra weight. A food expert (dietitian) may be able to help you plan your meals.  Do not use any products that contain nicotine or tobacco, such as cigarettes, e-cigarettes, and chewing tobacco. These can delay healing. If you need help quitting, ask your health care provider.  Keep all follow-up visits as told by your health care provider. This is important. Contact a health care provider if:  You have knee pain that is not getting better or gets worse.  You are unable to do your physical therapy exercises due to knee pain. Get help right away if:  Your knee swells and the swelling becomes worse.  You cannot move your knee.  You have severe knee pain. Summary  Knee pain that lasts more than 3 months is considered chronic knee pain.  The main treatments for chronic knee pain are physical therapy and weight loss. You may also need to take medicines, wear a knee sleeve or brace, use crutches, and apply ice or heat.  Losing even a little weight can reduce knee pain. Ask your health care provider what your ideal weight is, and how to safely lose extra weight. A food expert (dietitian) may be able to help you plan your meals.  Work with a physical therapist to make a safe exercise program, as told by your health care provider. This information is not intended to replace advice given to you by your health care provider. Make sure you discuss any questions you have with your health care provider. Document Revised: 03/04/2018 Document Reviewed: 03/04/2018 Elsevier Patient Education  [employer].

## 2019-10-19 NOTE — Progress Notes (Signed)
Subjective: CC: knee pain PCP: Mechele Claude, MD  DPO:Luke Davila is a 54 y.o. male presenting to clinic today for:  1. Bilateral knee pain [place] has a hx of bilateral knee arthritis. He reports a worsening of pain for the last week. Pain is a 7/10. He reports pain is achy and stiff. The pain is worse in the morning. He does have some swelling at times. He has been resting his knees. He has tried heat without much relief. He has not tried tylenol or advil. He was getting gel injections at [facility]. He reports good relief with those until this past week. He is not able to get more knee injections until January.  Was getting injections with [place] [facility]. He finished up the injections. He denies fever, warmth to joint, or recent injury.   Relevant past medical, surgical, family, and social history reviewed and updated as indicated.  Allergies and medications reviewed and updated.  No Known Allergies Past Medical History:  Diagnosis Date  . Avascular necrosis of hip (HCC)    LEFT  . Enlarged prostate   . Glaucoma   . Gout   . History of gout     Current Outpatient Medications:  .  albuterol (VENTOLIN HFA) 108 (90 Base) MCG/ACT inhaler, Inhale 2 puffs into the lungs every 6 (six) hours as needed for wheezing or shortness of breath., Disp: 1 each, Rfl: 11 .  augmented betamethasone dipropionate (DIPROLENE-AF) 0.05 % cream, Apply topically 2 (two) times daily. At affected areas (avoid face and genitals), Disp: 50 g, Rfl: 1 .  busPIRone (BUSPAR) 5 MG tablet, Take 1 tablet (5 mg total) by mouth 2 (two) times daily., Disp: 60 tablet, Rfl: 2 .  tamsulosin (FLOMAX) 0.4 MG CAPS capsule, Take 2 capsules (0.8 mg total) by mouth daily after supper., Disp: 60 capsule, Rfl: 5 .  testosterone cypionate (DEPOTESTOSTERONE CYPIONATE) 200 MG/ML injection, Inject 1 mL (200 mg total) into the muscle every 14 (fourteen) days., Disp: 10 mL, Rfl: 0 .  zolpidem (AMBIEN) 5 MG tablet, Take  1 tablet (5 mg total) by mouth at bedtime as needed for sleep., Disp: 30 tablet, Rfl: 2  Current Facility-Administered Medications:  .  betamethasone acetate-betamethasone sodium phosphate (CELESTONE) injection 6 mg, 6 mg, Intramuscular, Once, Mechele Claude, MD Social History   Socioeconomic History  . Marital status: Married    Spouse name: Ramona  . Number of children: 2  . Years of education: 12th  . Highest education level: Not on file  Occupational History    Employer: [employer]    Comment: Media planner  Tobacco Use  . Smoking status: Never Smoker  . Smokeless tobacco: Never Used  Substance and Sexual Activity  . Alcohol use: Yes    Comment: 3 beers daily  . Drug use: No  . Sexual activity: Yes    Birth control/protection: None  Other Topics Concern  . Not on file  Social History Narrative   Patient lives at home with his family.   Caffeine Use: 1 soda daily   Patient is right handed   Social Determinants of Health   Financial Resource Strain:   . Difficulty of Paying Living Expenses: Not on file  Food Insecurity:   . Worried About Programme researcher, broadcasting/film/video in the Last Year: Not on file  . Ran Out of Food in the Last Year: Not on file  Transportation Needs:   . Lack of Transportation (Medical): Not on file  . Lack of  Transportation (Non-Medical): Not on file  Physical Activity:   . Days of Exercise per Week: Not on file  . Minutes of Exercise per Session: Not on file  Stress:   . Feeling of Stress : Not on file  Social Connections:   . Frequency of Communication with Friends and Family: Not on file  . Frequency of Social Gatherings with Friends and Family: Not on file  . Attends Religious Services: Not on file  . Active Member of Clubs or Organizations: Not on file  . Attends Banker Meetings: Not on file  . Marital Status: Not on file  Intimate Partner Violence:   . Fear of Current or Ex-Partner: Not on file  . Emotionally Abused: Not on file   . Physically Abused: Not on file  . Sexually Abused: Not on file   Family History  Problem Relation Age of Onset  . Hypertension Mother   . Colon cancer Neg Hx     Review of Systems  Per HPI.  Objective: Office vital signs reviewed. BP 125/77   Pulse 86   Temp (!) 97.4 F (36.3 C) (Temporal)   Ht 6\' 3"  (1.905 m)   Wt 265 lb 8 oz (120.4 kg)   BMI 33.19 kg/m   Physical Examination:  Physical Exam Nursing note reviewed.  Constitutional:      Appearance: Normal appearance.  Cardiovascular:     Rate and Rhythm: Normal rate and regular rhythm.     Heart sounds: Normal heart sounds. No murmur heard.   Pulmonary:     Effort: Pulmonary effort is normal. No respiratory distress.  Musculoskeletal:     Right knee: No effusion, erythema or bony tenderness. No tenderness. Normal patellar mobility.     Left knee: No effusion, erythema or bony tenderness. No tenderness. Normal patellar mobility.     Comments: Mild swelling noted to bilateral knees.   Neurological:     General: No focal deficit present.     Mental Status: He is alert and oriented to person, place, and time.  Psychiatric:        Mood and Affect: Mood normal.        Behavior: Behavior normal.      Results for orders placed or performed in visit on 09/21/19  Novel Coronavirus, NAA ([facility])   Specimen: Nasopharyngeal(NP) swabs in vial transport medium  Result Value Ref Range   SARS-CoV-2, NAA Not Detected Not Detected     Assessment/ Plan: Luke Davila was seen today for knee pain.  Diagnoses and all orders for this visit:  Bilateral primary osteoarthritis of knee No recent injury. No systemic symptoms or indications for septic joints. Steroid IM injection today in office. Added voltaren PO. Rest, heat, voltaren gel, tylenol. Return to office for new or worsening symptoms. -     diclofenac (VOLTAREN) 75 MG EC tablet; Take 1 tablet (75 mg total) by mouth 2 (two) times daily. -     methylPREDNISolone acetate  (DEPO-MEDROL) injection 80 mg  Follow up as needed.   The above assessment and management plan was discussed with the patient. The patient verbalized understanding of and has agreed to the management plan. Patient is aware to call the clinic if symptoms persist or worsen. Patient is aware when to return to the clinic for a follow-up visit. Patient educated on when it is appropriate to go to the emergency department.   Remi Deter, FNP-C [facility] [address] [place], [place] [phone]

## 2019-10-24 ENCOUNTER — Ambulatory Visit: Payer: 59 | Admitting: *Deleted

## 2019-10-24 ENCOUNTER — Other Ambulatory Visit: Payer: Self-pay

## 2019-10-24 ENCOUNTER — Other Ambulatory Visit: Payer: 59

## 2019-10-24 DIAGNOSIS — R410 Disorientation, unspecified: Secondary | ICD-10-CM

## 2019-10-25 ENCOUNTER — Other Ambulatory Visit: Payer: Self-pay | Admitting: Family Medicine

## 2019-10-25 ENCOUNTER — Telehealth: Payer: Self-pay | Admitting: Family Medicine

## 2019-10-25 ENCOUNTER — Other Ambulatory Visit: Payer: Self-pay | Admitting: *Deleted

## 2019-10-25 DIAGNOSIS — R410 Disorientation, unspecified: Secondary | ICD-10-CM

## 2019-10-25 DIAGNOSIS — E291 Testicular hypofunction: Secondary | ICD-10-CM

## 2019-10-25 LAB — GLUCOSE HEMOCUE WAIVED: Glu Hemocue Waived: 76 mg/dL (ref 65–99)

## 2019-10-25 MED ORDER — TESTOSTERONE CYPIONATE 200 MG/ML IM SOLN: 200.0000 mg | INTRAMUSCULAR | 0 refills | Status: DC

## 2019-10-25 NOTE — Telephone Encounter (Signed)
Pt said that testosterone needs to be sent to pharmacy and he will be bringing in paperwork that needs to be signed for his employer

## 2019-10-25 NOTE — Telephone Encounter (Signed)
Last check up was with Dr. Darlyn Read on 08/25/19- please advise if this can be sent or patient needs to be seen

## 2019-10-25 NOTE — Telephone Encounter (Signed)
Please let the patient know that I sent their prescription to their pharmacy. Thanks, WS 

## 2019-10-25 NOTE — Telephone Encounter (Signed)
Patient aware.

## 2019-10-26 NOTE — Progress Notes (Signed)
Hello Nehal,  Your lab result is normal and/or stable.Some minor variations that are not significant are commonly marked abnormal, but do not represent any medical problem for you.  Best regards, Louis Ivery, M.D.

## 2019-10-31 ENCOUNTER — Ambulatory Visit (INDEPENDENT_AMBULATORY_CARE_PROVIDER_SITE_OTHER): Payer: 59 | Admitting: *Deleted

## 2019-10-31 ENCOUNTER — Other Ambulatory Visit: Payer: Self-pay

## 2019-10-31 DIAGNOSIS — E291 Testicular hypofunction: Secondary | ICD-10-CM

## 2019-10-31 MED ORDER — TESTOSTERONE CYPIONATE 200 MG/ML IM SOLN
200.0000 mg | INTRAMUSCULAR | Status: AC
  Administered 2019-10-31 – 2020-04-04 (×9): 200 mg via INTRAMUSCULAR

## 2019-10-31 NOTE — Progress Notes (Signed)
Testosterone injection given and tolerated well.  

## 2019-11-16 ENCOUNTER — Ambulatory Visit (INDEPENDENT_AMBULATORY_CARE_PROVIDER_SITE_OTHER): Payer: 59

## 2019-11-16 ENCOUNTER — Other Ambulatory Visit: Payer: Self-pay

## 2019-11-16 DIAGNOSIS — E291 Testicular hypofunction: Secondary | ICD-10-CM | POA: Diagnosis not present

## 2019-11-16 NOTE — Progress Notes (Signed)
Testosterone injection given to left upper outer quadrant.  Patient tolerated well. 

## 2019-11-21 DIAGNOSIS — Z029 Encounter for administrative examinations, unspecified: Secondary | ICD-10-CM

## 2019-11-30 ENCOUNTER — Other Ambulatory Visit: Payer: Self-pay

## 2019-11-30 ENCOUNTER — Ambulatory Visit (INDEPENDENT_AMBULATORY_CARE_PROVIDER_SITE_OTHER): Payer: 59

## 2019-11-30 DIAGNOSIS — E291 Testicular hypofunction: Secondary | ICD-10-CM

## 2019-12-16 ENCOUNTER — Ambulatory Visit: Payer: 59

## 2019-12-20 ENCOUNTER — Ambulatory Visit (INDEPENDENT_AMBULATORY_CARE_PROVIDER_SITE_OTHER): Payer: 59 | Admitting: *Deleted

## 2019-12-20 ENCOUNTER — Other Ambulatory Visit: Payer: Self-pay

## 2019-12-20 DIAGNOSIS — E291 Testicular hypofunction: Secondary | ICD-10-CM | POA: Diagnosis not present

## 2019-12-20 NOTE — Progress Notes (Signed)
Patient in today for biweekly Testosterone injection. 200 mg given IM in left upper outer quadrant. Patient tolerated well.  °

## 2020-01-03 ENCOUNTER — Ambulatory Visit: Payer: 59

## 2020-01-11 ENCOUNTER — Other Ambulatory Visit: Payer: Self-pay

## 2020-01-11 ENCOUNTER — Ambulatory Visit (INDEPENDENT_AMBULATORY_CARE_PROVIDER_SITE_OTHER): Payer: 59

## 2020-01-11 DIAGNOSIS — E291 Testicular hypofunction: Secondary | ICD-10-CM

## 2020-01-25 ENCOUNTER — Ambulatory Visit: Payer: 59

## 2020-01-30 ENCOUNTER — Ambulatory Visit (INDEPENDENT_AMBULATORY_CARE_PROVIDER_SITE_OTHER): Payer: 59

## 2020-01-30 ENCOUNTER — Other Ambulatory Visit: Payer: Self-pay

## 2020-01-30 DIAGNOSIS — E291 Testicular hypofunction: Secondary | ICD-10-CM | POA: Diagnosis not present

## 2020-01-30 NOTE — Progress Notes (Signed)
Testosterone injection given to left upper outer quadrant.  Patient tolerated well. 

## 2020-01-31 ENCOUNTER — Ambulatory Visit: Payer: 59

## 2020-02-21 ENCOUNTER — Ambulatory Visit (INDEPENDENT_AMBULATORY_CARE_PROVIDER_SITE_OTHER): Payer: 59 | Admitting: Family Medicine

## 2020-02-21 ENCOUNTER — Encounter: Payer: Self-pay | Admitting: Family Medicine

## 2020-02-21 ENCOUNTER — Telehealth: Payer: Self-pay

## 2020-02-21 ENCOUNTER — Other Ambulatory Visit: Payer: Self-pay | Admitting: Family Medicine

## 2020-02-21 DIAGNOSIS — J40 Bronchitis, not specified as acute or chronic: Secondary | ICD-10-CM

## 2020-02-21 DIAGNOSIS — J329 Chronic sinusitis, unspecified: Secondary | ICD-10-CM

## 2020-02-21 MED ORDER — PREDNISONE 10 MG PO TABS: ORAL_TABLET | ORAL | 0 refills | Status: DC

## 2020-02-21 MED ORDER — BENZONATATE 200 MG PO CAPS: 200.0000 mg | ORAL_CAPSULE | Freq: Three times a day (TID) | ORAL | 0 refills | Status: DC | PRN

## 2020-02-21 MED ORDER — AZITHROMYCIN 250 MG PO TABS: ORAL_TABLET | ORAL | 0 refills | Status: DC

## 2020-02-21 NOTE — Progress Notes (Signed)
Subjective:    Patient ID: Luke Davila, male    DOB: 1965-08-22, 55 y.o.   MRN: [id number]   HPI: Luke Davila is a 55 y.o. male presenting for same thing as last time. Congested. Bad cough for three weeks. Wheezing all the time. Dyspneic if walking around the house.Cough producing sputum that he can't cough up. Denies fever. Gets temp check at work daily.    Depression screen [facility] 2/9 08/15/2019 08/15/2019 02/22/2019 02/14/2019 07/15/2018  Decreased Interest 0 0 0 0 0  Down, Depressed, Hopeless 0 0 0 0 0  PHQ - 2 Score 0 0 0 0 0  Some recent data might be hidden     Relevant past medical, surgical, family and social history reviewed and updated as indicated.  Interim medical history since our last visit reviewed. Allergies and medications reviewed and updated.  ROS:  Review of Systems  Constitutional: Negative for activity change, appetite change, chills and fever.  HENT: Positive for congestion, postnasal drip, rhinorrhea and sinus pressure. Negative for ear discharge, ear pain, hearing loss, nosebleeds, sneezing and trouble swallowing.   Respiratory: Positive for cough, chest tightness and wheezing. Negative for shortness of breath.   Cardiovascular: Negative for chest pain and palpitations.  Skin: Negative for rash.     Social History   Tobacco Use  Smoking Status Never Smoker  Smokeless Tobacco Never Used       Objective:     Wt Readings from Last 3 Encounters:  10/19/19 265 lb 8 oz (120.4 kg)  08/15/19 251 lb 12.8 oz (114.2 kg)  02/22/19 265 lb (120.2 kg)     Exam deferred. Pt. Harboring due to COVID 19. Phone visit performed.   Assessment & Plan:   1. Sinobronchitis     Meds ordered this encounter  Medications  . azithromycin (ZITHROMAX Z-PAK) 250 MG tablet    Sig: Take two right away Then one a day for the next 4 days.    Dispense:  6 each    Refill:  0  . predniSONE (DELTASONE) 10 MG tablet    Sig: Take 5 daily for 2 days followed by 4,3,2 and 1 for  2 days each.    Dispense:  30 tablet    Refill:  0    No orders of the defined types were placed in this encounter.     Diagnoses and all orders for this visit:  Sinobronchitis  Other orders -     azithromycin (ZITHROMAX Z-PAK) 250 MG tablet; Take two right away Then one a day for the next 4 days. -     predniSONE (DELTASONE) 10 MG tablet; Take 5 daily for 2 days followed by 4,3,2 and 1 for 2 days each.    Virtual Visit via telephone Note  I discussed the limitations, risks, security and privacy concerns of performing an evaluation and management service by telephone and the availability of in person appointments. The patient was identified with two identifiers. Pt.expressed understanding and agreed to proceed. Pt. Is at home. Dr. Darlyn Read is in his office.  Follow Up Instructions:   I discussed the assessment and treatment plan with the patient. The patient was provided an opportunity to ask questions and all were answered. The patient agreed with the plan and demonstrated an understanding of the instructions.   The patient was advised to call back or seek an in-person evaluation if the symptoms worsen or if the condition fails to improve as anticipated.   Total minutes including chart  review and phone contact time: 16   Follow up plan: Return if symptoms worsen or fail to improve.  Mechele Claude, MD [place] [facility]

## 2020-02-27 ENCOUNTER — Ambulatory Visit (INDEPENDENT_AMBULATORY_CARE_PROVIDER_SITE_OTHER): Payer: 59

## 2020-02-27 ENCOUNTER — Other Ambulatory Visit: Payer: Self-pay | Admitting: Family Medicine

## 2020-02-27 ENCOUNTER — Other Ambulatory Visit: Payer: Self-pay

## 2020-02-27 ENCOUNTER — Telehealth: Payer: Self-pay

## 2020-02-27 DIAGNOSIS — E291 Testicular hypofunction: Secondary | ICD-10-CM | POA: Diagnosis not present

## 2020-02-27 DIAGNOSIS — E782 Mixed hyperlipidemia: Secondary | ICD-10-CM

## 2020-02-27 DIAGNOSIS — Z125 Encounter for screening for malignant neoplasm of prostate: Secondary | ICD-10-CM

## 2020-02-27 DIAGNOSIS — M162 Bilateral osteoarthritis resulting from hip dysplasia: Secondary | ICD-10-CM

## 2020-02-27 DIAGNOSIS — D860 Sarcoidosis of lung: Secondary | ICD-10-CM

## 2020-02-27 NOTE — Progress Notes (Signed)
Testosterone injection given and tolerated well.  

## 2020-02-27 NOTE — Telephone Encounter (Signed)
Appointment scheduled 3/7- does patient need to come in a few days before to get his testosterone checked ? He has missed some does. If so please place labs you would like

## 2020-02-27 NOTE — Telephone Encounter (Signed)
Yes, I entered the orders

## 2020-02-28 ENCOUNTER — Other Ambulatory Visit: Payer: Self-pay | Admitting: *Deleted

## 2020-02-28 DIAGNOSIS — E7800 Pure hypercholesterolemia, unspecified: Secondary | ICD-10-CM

## 2020-02-28 DIAGNOSIS — E782 Mixed hyperlipidemia: Secondary | ICD-10-CM

## 2020-02-28 DIAGNOSIS — E291 Testicular hypofunction: Secondary | ICD-10-CM

## 2020-02-28 DIAGNOSIS — Z125 Encounter for screening for malignant neoplasm of prostate: Secondary | ICD-10-CM

## 2020-02-28 NOTE — Telephone Encounter (Signed)
Pt aware.

## 2020-02-29 ENCOUNTER — Encounter: Payer: Self-pay | Admitting: Family Medicine

## 2020-03-05 ENCOUNTER — Other Ambulatory Visit: Payer: Self-pay

## 2020-03-05 ENCOUNTER — Other Ambulatory Visit: Payer: 59

## 2020-03-05 DIAGNOSIS — E782 Mixed hyperlipidemia: Secondary | ICD-10-CM

## 2020-03-05 DIAGNOSIS — E291 Testicular hypofunction: Secondary | ICD-10-CM

## 2020-03-05 DIAGNOSIS — E7800 Pure hypercholesterolemia, unspecified: Secondary | ICD-10-CM

## 2020-03-05 DIAGNOSIS — Z125 Encounter for screening for malignant neoplasm of prostate: Secondary | ICD-10-CM

## 2020-03-06 ENCOUNTER — Other Ambulatory Visit: Payer: Self-pay | Admitting: Family Medicine

## 2020-03-06 DIAGNOSIS — M2550 Pain in unspecified joint: Secondary | ICD-10-CM

## 2020-03-06 LAB — CBC WITH DIFFERENTIAL/PLATELET
Basophils Absolute: 0.1 10*3/uL (ref 0.0–0.2)
Basos: 1 %
EOS (ABSOLUTE): 0.3 10*3/uL (ref 0.0–0.4)
Eos: 6 %
Hematocrit: 44 % (ref 37.5–51.0)
Hemoglobin: 14.4 g/dL (ref 13.0–17.7)
Immature Grans (Abs): 0 10*3/uL (ref 0.0–0.1)
Immature Granulocytes: 0 %
Lymphocytes Absolute: 1.6 10*3/uL (ref 0.7–3.1)
Lymphs: 26 %
MCH: 24.9 pg — ABNORMAL LOW (ref 26.6–33.0)
MCHC: 32.7 g/dL (ref 31.5–35.7)
MCV: 76 fL — ABNORMAL LOW (ref 79–97)
Monocytes Absolute: 0.7 10*3/uL (ref 0.1–0.9)
Monocytes: 11 %
Neutrophils Absolute: 3.4 10*3/uL (ref 1.4–7.0)
Neutrophils: 56 %
Platelets: 294 10*3/uL (ref 150–450)
RBC: 5.79 x10E6/uL (ref 4.14–5.80)
RDW: 12.8 % (ref 11.6–15.4)
WBC: 6 10*3/uL (ref 3.4–10.8)

## 2020-03-06 LAB — TESTOSTERONE,FREE AND TOTAL
Testosterone, Free: 9.3 pg/mL (ref 7.2–24.0)
Testosterone: 525 ng/dL (ref 264–916)

## 2020-03-06 LAB — LIPID PANEL
Chol/HDL Ratio: 3.9 ratio (ref 0.0–5.0)
Cholesterol, Total: 223 mg/dL — ABNORMAL HIGH (ref 100–199)
HDL: 57 mg/dL (ref >39)
LDL Chol Calc (NIH): 140 mg/dL — ABNORMAL HIGH (ref 0–99)
Triglycerides: 144 mg/dL (ref 0–149)
VLDL Cholesterol Cal: 26 mg/dL (ref 5–40)

## 2020-03-06 LAB — CMP14+EGFR
ALT: 13 IU/L (ref 0–44)
AST: 26 IU/L (ref 0–40)
Albumin/Globulin Ratio: 1.5 (ref 1.2–2.2)
Albumin: 4.1 g/dL (ref 3.8–4.9)
Alkaline Phosphatase: 123 IU/L — ABNORMAL HIGH (ref 44–121)
BUN/Creatinine Ratio: 11 (ref 9–20)
BUN: 14 mg/dL (ref 6–24)
Bilirubin Total: 0.8 mg/dL (ref 0.0–1.2)
CO2: 24 mmol/L (ref 20–29)
Calcium: 9.3 mg/dL (ref 8.7–10.2)
Chloride: 96 mmol/L (ref 96–106)
Creatinine, Ser: 1.25 mg/dL (ref 0.76–1.27)
Globulin, Total: 2.7 g/dL (ref 1.5–4.5)
Glucose: 86 mg/dL (ref 65–99)
Potassium: 4.7 mmol/L (ref 3.5–5.2)
Sodium: 134 mmol/L (ref 134–144)
Total Protein: 6.8 g/dL (ref 6.0–8.5)
eGFR: 68 mL/min/{1.73_m2} (ref >59)

## 2020-03-06 LAB — PSA, TOTAL AND FREE
PSA, Free Pct: 25 %
PSA, Free: 0.1 ng/mL
Prostate Specific Ag, Serum: 0.4 ng/mL (ref 0.0–4.0)

## 2020-03-12 ENCOUNTER — Other Ambulatory Visit: Payer: Self-pay

## 2020-03-12 ENCOUNTER — Encounter: Payer: Self-pay | Admitting: Family Medicine

## 2020-03-12 ENCOUNTER — Ambulatory Visit (INDEPENDENT_AMBULATORY_CARE_PROVIDER_SITE_OTHER): Payer: 59 | Admitting: Family Medicine

## 2020-03-12 VITALS — BP 132/82 | HR 86 | Temp 97.6°F | Ht 75.0 in | Wt 261.0 lb

## 2020-03-12 DIAGNOSIS — E291 Testicular hypofunction: Secondary | ICD-10-CM | POA: Diagnosis not present

## 2020-03-12 DIAGNOSIS — F5101 Primary insomnia: Secondary | ICD-10-CM | POA: Diagnosis not present

## 2020-03-12 DIAGNOSIS — M170 Bilateral primary osteoarthritis of knee: Secondary | ICD-10-CM

## 2020-03-12 MED ORDER — DICLOFENAC SODIUM 75 MG PO TBEC: 75.0000 mg | DELAYED_RELEASE_TABLET | Freq: Two times a day (BID) | ORAL | 0 refills | Status: DC

## 2020-03-12 MED ORDER — FEXOFENADINE-PSEUDOEPHED ER 180-240 MG PO TB24: 1.0000 | ORAL_TABLET | Freq: Every evening | ORAL | 11 refills | Status: DC

## 2020-03-12 MED ORDER — ZOLPIDEM TARTRATE 5 MG PO TABS
5.0000 mg | ORAL_TABLET | Freq: Every evening | ORAL | 2 refills | Status: DC | PRN
Start: 2020-03-12 — End: 2020-10-15

## 2020-03-12 MED ORDER — TESTOSTERONE CYPIONATE 200 MG/ML IM SOLN: 200.0000 mg | INTRAMUSCULAR | 0 refills | Status: DC

## 2020-03-12 MED ORDER — MOXIFLOXACIN HCL 400 MG PO TABS: 400.0000 mg | ORAL_TABLET | Freq: Every day | ORAL | 0 refills | Status: DC

## 2020-03-12 NOTE — Progress Notes (Signed)
Subjective:  Patient ID: Luke Davila, male    DOB: 16-Jul-1965  Age: 55 y.o. MRN: [id number]  CC: Medical Management of Chronic Issues   HPI Luke Davila presents for chest congestion and coughing up white to yellow sputum. Awakens at 1 AM if he doesn't have any ambien.    Follow up for testosterone deficiency: Pt. Using medication as directed. Denies any sx referrable to DVT such as edema or erythema of legs. No dyspnea or chest pain. Energy level reported as being good. Libido is normal and denies E.D. Feels strength is adequate and improved from baseline. Patient is having a little bit of swelling at the ankles.  Just enough to get the imprint of his after a 12-hour shift standing on his feet in steel toed boots. Depression screen [facility] 2/9 03/12/2020 08/15/2019 08/15/2019  Decreased Interest 0 0 0  Down, Depressed, Hopeless 0 0 0  PHQ - 2 Score 0 0 0  Some recent data might be hidden    History Luke Davila has a past medical history of Avascular necrosis of hip ([place]), Enlarged prostate, Glaucoma, Gout, and History of gout.   He has a past surgical history that includes Foot surgery (Left, 1985); Left hand surgery; Total hip arthroplasty (Left, 04/09/2015); and Colonoscopy with propofol (N/A, 11/27/2015).   His family history includes Hypertension in his mother.He reports that he has never smoked. He has never used smokeless tobacco. He reports current alcohol use. He reports that he does not use drugs.    ROS Review of Systems  Constitutional: Negative for fever.  Respiratory: Negative for shortness of breath.   Cardiovascular: Negative for chest pain.  Musculoskeletal: Negative for arthralgias.  Skin: Negative for rash.    Objective:  BP 132/82   Pulse 86   Temp 97.6 F (36.4 C)   Ht '6\' 3"'  (1.905 m)   Wt 261 lb (118.4 kg)   SpO2 98%   BMI 32.62 kg/m   BP Readings from Last 3 Encounters:  03/12/20 132/82  10/19/19 125/77  08/15/19 108/60    Wt Readings from Last 3  Encounters:  03/12/20 261 lb (118.4 kg)  10/19/19 265 lb 8 oz (120.4 kg)  08/15/19 251 lb 12.8 oz (114.2 kg)     Physical Exam Vitals reviewed.  Constitutional:      Appearance: He is well-developed and well-nourished.  HENT:     Head: Normocephalic and atraumatic.     Right Ear: External ear normal.     Left Ear: External ear normal.     Mouth/Throat:     Pharynx: No oropharyngeal exudate or posterior oropharyngeal erythema.  Eyes:     Pupils: Pupils are equal, round, and reactive to light.  Cardiovascular:     Rate and Rhythm: Normal rate and regular rhythm.     Heart sounds: No murmur heard.   Pulmonary:     Effort: No respiratory distress.     Breath sounds: Wheezing (scattered) present.  Musculoskeletal:     Cervical back: Normal range of motion and neck supple.  Neurological:     Mental Status: He is alert and oriented to person, place, and time.     Results for orders placed or performed in visit on 03/05/20  PSA, total and free  Result Value Ref Range   Prostate Specific Ag, Serum 0.4 0.0 - 4.0 ng/mL   PSA, Free 0.10 N/A ng/mL   PSA, Free Pct 25.0 %  Testosterone,Free and Total  Result Value Ref Range  Testosterone 525 264 - 916 ng/dL   Testosterone, Free 9.3 7.2 - 24.0 pg/mL  Lipid panel  Result Value Ref Range   Cholesterol, Total 223 (H) 100 - 199 mg/dL   Triglycerides 144 0 - 149 mg/dL   HDL 57 >39 mg/dL   VLDL Cholesterol Cal 26 5 - 40 mg/dL   LDL Chol Calc (NIH) 140 (H) 0 - 99 mg/dL   Chol/HDL Ratio 3.9 0.0 - 5.0 ratio  CMP14+EGFR  Result Value Ref Range   Glucose 86 65 - 99 mg/dL   BUN 14 6 - 24 mg/dL   Creatinine, Ser 1.25 0.76 - 1.27 mg/dL   eGFR 68 >59 mL/min/1.73   BUN/Creatinine Ratio 11 9 - 20   Sodium 134 134 - 144 mmol/L   Potassium 4.7 3.5 - 5.2 mmol/L   Chloride 96 96 - 106 mmol/L   CO2 24 20 - 29 mmol/L   Calcium 9.3 8.7 - 10.2 mg/dL   Total Protein 6.8 6.0 - 8.5 g/dL   Albumin 4.1 3.8 - 4.9 g/dL   Globulin, Total 2.7 1.5 -  4.5 g/dL   Albumin/Globulin Ratio 1.5 1.2 - 2.2   Bilirubin Total 0.8 0.0 - 1.2 mg/dL   Alkaline Phosphatase 123 (H) 44 - 121 IU/L   AST 26 0 - 40 IU/L   ALT 13 0 - 44 IU/L  CBC with Differential/Platelet  Result Value Ref Range   WBC 6.0 3.4 - 10.8 x10E3/uL   RBC 5.79 4.14 - 5.80 x10E6/uL   Hemoglobin 14.4 13.0 - 17.7 g/dL   Hematocrit 44.0 37.5 - 51.0 %   MCV 76 (L) 79 - 97 fL   MCH 24.9 (L) 26.6 - 33.0 pg   MCHC 32.7 31.5 - 35.7 g/dL   RDW 12.8 11.6 - 15.4 %   Platelets 294 150 - 450 x10E3/uL   Neutrophils 56 Not Estab. %   Lymphs 26 Not Estab. %   Monocytes 11 Not Estab. %   Eos 6 Not Estab. %   Basos 1 Not Estab. %   Neutrophils Absolute 3.4 1.4 - 7.0 x10E3/uL   Lymphocytes Absolute 1.6 0.7 - 3.1 x10E3/uL   Monocytes Absolute 0.7 0.1 - 0.9 x10E3/uL   EOS (ABSOLUTE) 0.3 0.0 - 0.4 x10E3/uL   Basophils Absolute 0.1 0.0 - 0.2 x10E3/uL   Immature Granulocytes 0 Not Estab. %   Immature Grans (Abs) 0.0 0.0 - 0.1 x10E3/uL     Assessment & Plan:   Luke Davila was seen today for medical management of chronic issues.  Diagnoses and all orders for this visit:  Hypogonadism in male -     testosterone cypionate (DEPOTESTOSTERONE CYPIONATE) 200 MG/ML injection; Inject 1 mL (200 mg total) into the muscle every 14 (fourteen) days.  Primary insomnia -     zolpidem (AMBIEN) 5 MG tablet; Take 1 tablet (5 mg total) by mouth at bedtime as needed for sleep.  Bilateral primary osteoarthritis of knee -     diclofenac (VOLTAREN) 75 MG EC tablet; Take 1 tablet (75 mg total) by mouth 2 (two) times daily.  Other orders -     fexofenadine-pseudoephedrine (ALLEGRA-D 24) 180-240 MG 24 hr tablet; Take 1 tablet by mouth every evening. For allergy and congestion -     moxifloxacin (AVELOX) 400 MG tablet; Take 1 tablet (400 mg total) by mouth daily. Take all of these, for infection       I have discontinued Luke Davila's azithromycin, predniSONE, and benzonatate. I am also having him start  on  fexofenadine-pseudoephedrine and moxifloxacin. Additionally, I am having him maintain his augmented betamethasone dipropionate, tamsulosin, albuterol, zolpidem, diclofenac, and testosterone cypionate. We administered testosterone cypionate. We will continue to administer testosterone cypionate.  Allergies as of 03/12/2020   No Known Allergies     Medication List       Accurate as of March 12, 2020  2:51 PM. If you have any questions, ask your nurse or doctor.        STOP taking these medications   azithromycin 250 MG tablet Commonly known as: Zithromax Z-Pak Stopped by: Claretta Fraise, MD   benzonatate 200 MG capsule Commonly known as: TESSALON Stopped by: Claretta Fraise, MD   predniSONE 10 MG tablet Commonly known as: DELTASONE Stopped by: Claretta Fraise, MD     TAKE these medications   albuterol 108 (90 Base) MCG/ACT inhaler Commonly known as: VENTOLIN HFA Inhale 2 puffs into the lungs every 6 (six) hours as needed for wheezing or shortness of breath.   augmented betamethasone dipropionate 0.05 % cream Commonly known as: DIPROLENE-AF Apply topically 2 (two) times daily. At affected areas (avoid face and genitals)   diclofenac 75 MG EC tablet Commonly known as: VOLTAREN Take 1 tablet (75 mg total) by mouth 2 (two) times daily.   fexofenadine-pseudoephedrine 180-240 MG 24 hr tablet Commonly known as: ALLEGRA-D 24 Take 1 tablet by mouth every evening. For allergy and congestion Started by: Claretta Fraise, MD   moxifloxacin 400 MG tablet Commonly known as: Avelox Take 1 tablet (400 mg total) by mouth daily. Take all of these, for infection Started by: Claretta Fraise, MD   tamsulosin 0.4 MG Caps capsule Commonly known as: FLOMAX Take 2 capsules (0.8 mg total) by mouth daily after supper.   testosterone cypionate 200 MG/ML injection Commonly known as: DEPOTESTOSTERONE CYPIONATE Inject 1 mL (200 mg total) into the muscle every 14 (fourteen) days.   zolpidem 5 MG  tablet Commonly known as: AMBIEN Take 1 tablet (5 mg total) by mouth at bedtime as needed for sleep.        Follow-up: Return in about 6 months (around 09/12/2020).  Claretta Fraise, M.D.

## 2020-03-13 ENCOUNTER — Other Ambulatory Visit: Payer: Self-pay

## 2020-03-13 ENCOUNTER — Ambulatory Visit: Payer: 59 | Attending: Family Medicine | Admitting: Physical Therapy

## 2020-03-13 ENCOUNTER — Encounter: Payer: Self-pay | Admitting: Physical Therapy

## 2020-03-13 DIAGNOSIS — M6281 Muscle weakness (generalized): Secondary | ICD-10-CM | POA: Insufficient documentation

## 2020-03-13 DIAGNOSIS — R262 Difficulty in walking, not elsewhere classified: Secondary | ICD-10-CM | POA: Diagnosis present

## 2020-03-13 NOTE — Therapy (Signed)
[facility] [address] [place], [place], [address] Phone: [phone]   Fax:  [phone]  Physical Therapy Evaluation  Patient Details  Name: Luke Davila MRN: [id number] Date of Birth: September 22, 1965 Referring Provider (PT): Mechele Claude, MD   Encounter Date: 03/13/2020   PT End of Session - 03/13/20 1502    Visit Number 1    Number of Visits 12    Date for PT Re-Evaluation 05/01/20    Authorization Type [employer], Progress note every 10th visit    PT Start Time 1300    PT Stop Time 1340    PT Time Calculation (min) 40 min    Activity Tolerance Patient tolerated treatment well    Behavior During Therapy [facility] for tasks assessed/performed           Past Medical History:  Diagnosis Date  . Avascular necrosis of hip (HCC)    LEFT  . Enlarged prostate   . Glaucoma   . Gout   . History of gout     Past Surgical History:  Procedure Laterality Date  . COLONOSCOPY WITH PROPOFOL N/A 11/27/2015   Procedure: COLONOSCOPY WITH PROPOFOL;  Surgeon: West Bali, MD;  [facility];  Service: Endoscopy;  Laterality: N/A;  1100  . FOOT SURGERY Left 1985  . Left hand surgery    . TOTAL HIP ARTHROPLASTY Left 04/09/2015   Procedure: LEFT TOTAL HIP ARTHROPLASTY ANTERIOR APPROACH;  Surgeon: Ollen Gross, MD;  Location: [facility];  Service: Orthopedics;  Laterality: Left;    There were no vitals filed for this visit.    Subjective Assessment - 03/13/20 1452    Subjective COVID-19 screening performed upon arrival. Patient arrives to physical therapy with reports of bilateral LE weakness, bilateral knee pain, difficulty walking, and loss of balance that began about 2 years ago. Patient reports since COVID began, his physical activity has been limited. Patient reports having difficulties with transfers and being cautious with steps. Patient's goals are to decrease pain, improve movement, improve strength, and improve standing and  walking tolerance.    Pertinent History PVC, glaucoma, bilateral primary osteoarthritis of knee, left THA    Limitations Standing;Walking;House hold activities    Diagnostic tests n/a    Patient Stated Goals build strength and feel confident with exercises at [employer].    Currently in Pain? No/denies              [facility] PT Assessment - 03/13/20 0001      Assessment   Medical Diagnosis Arthralgia, unspecified    Referring Provider (PT) Mechele Claude, MD    Onset Date/Surgical Date --   2 years   Next MD Visit 6 months      Precautions   Precautions None      Restrictions   Weight Bearing Restrictions No      Balance Screen   Has the patient fallen in the past 6 months Yes    How many times? 1   fall on ice   Has the patient had a decrease in activity level because of a fear of falling?  No    Is the patient reluctant to leave their home because of a fear of falling?  No      Home Environment   Living Environment Private residence    Living Arrangements Alone      Prior Function   Level of Independence Independent      ROM / Strength   AROM / PROM /  Strength Strength      Strength   Strength Assessment Site Hip;Knee    Right/Left Hip Right;Left    Right Hip Flexion 4-/5    Right Hip Extension 3-/5    Right Hip ABduction 3+/5    Left Hip Flexion 4-/5    Left Hip Extension 3-/5    Left Hip ABduction 3+/5    Right/Left Knee Right;Left    Right Knee Flexion 4/5    Right Knee Extension 4+/5    Left Knee Flexion 4/5    Left Knee Extension 4+/5      Transfers   Five time sit to stand comments  21.29 seconds no UE support    Comments slow transitions      Ambulation/Gait   Gait Pattern Step-through pattern;Decreased stride length;Decreased step length - left;Decreased step length - right;Decreased hip/knee flexion - right;Decreased hip/knee flexion - left;Trunk flexed;Left flexed knee in stance;Right flexed knee in stance      6 Minute Walk- Baseline   6  Minute Walk- Baseline yes    HR (bpm) 94    02 Sat (%RA) 98 %    Modified Borg Scale for Dyspnea 0- Nothing at all      6 Minute walk- Post Test   6 Minute Walk Post Test yes    HR (bpm) 113    02 Sat (%RA) 94 %    Modified Borg Scale for Dyspnea 2- Mild shortness of breath      6 minute walk test results    Aerobic Endurance Distance Walked 267    Endurance additional comments 2 minute walk test                      Objective measurements completed on examination: See above findings.               PT Education - 03/13/20 1501    Education Details marching with UEs, hip abduction, HR/TR    Person(s) Educated Patient    Methods Explanation;Handout    Comprehension Verbalized understanding               PT Long Term Goals - 03/13/20 1725      PT LONG TERM GOAL #1   Title Patient will be independent with HEP and its progression    Time 6    Period Weeks    Status New      PT LONG TERM GOAL #2   Title Patient will demonstrate 4+/5 or greater bilateral LE MMT to improve stability during functional tasks.    Time 6    Period Weeks    Status New      PT LONG TERM GOAL #3   Title Patient will decrease risk of falls and improve functional LE strength as noted by the ability to perform 5x sit to stand with no UE support in 13 seconds or less.    Time 6    Period Weeks    Status New      PT LONG TERM GOAL #4   Title Patient will improve aerobic capacity as noted by the ability to walk 300 feet or greater during .    Time 6    Period Weeks    Status New                  Plan - 03/13/20 1505    Clinical Impression Statement Patient is a 55 year old male who presents to physical therapy with bilateral  LE weakness, decreased cardiovascular endurance, and difficulty walking that began about 2 years ago. Patient ambulates with decreased bilateral knee flexion during swing, increased forward flexion, and decreased stride lengths.  Patient's 5x sit to stand with UE support categorizes him as a fall risk with decreased bilateral LE functional strength. Patient's 2 minute walk test distance is less than average for age range indicating decreased aerobic capacity. Patient and PT discussed POC and discussed HEP to which patient reported understanding. Patient would benefit from skilled physical therapy to address deficits and address patient's goals.    Personal Factors and Comorbidities Comorbidity 3+    Comorbidities PVC, glaucoma, bilateral primary osteoarthritis of knee, left THA    Examination-Activity Limitations Stand;Locomotion Level;Transfers;Stairs    Examination-Participation Restrictions Occupation    Stability/Clinical Decision Making Stable/Uncomplicated    Clinical Decision Making Low    Rehab Potential Good    PT Frequency 2x / week    PT Duration 6 weeks    PT Treatment/Interventions ADLs/Self Care Home Management;Gait training;Stair training;Functional mobility training;Therapeutic exercise;Therapeutic activities;Balance training;Neuromuscular re-education;Manual techniques;Passive range of motion;Patient/family education    PT Next Visit Plan nustep, LE strengthening in various positions, balance activities per tolerance. Assess vitals and SaO2.    PT Home Exercise Plan see patient education section    Consulted and Agree with Plan of Care Patient           Patient will benefit from skilled therapeutic intervention in order to improve the following deficits and impairments:  Decreased activity tolerance,Decreased balance,Decreased strength,Pain,Difficulty walking  Visit Diagnosis: Muscle weakness (generalized) - Plan: PT plan of care cert/re-cert  Difficulty in walking, not elsewhere classified - Plan: PT plan of care cert/re-cert     Problem List Patient Active Problem List   Diagnosis Date Noted  . Sarcoidosis of lung (HCC) 09/30/2019  . Primary insomnia 02/14/2019  . Bilateral primary  osteoarthritis of knee 03/24/2018  . PVC's (premature ventricular contractions) 02/03/2018  . Hyperlipidemia 02/03/2018  . Hyponatremia 08/06/2016  . Constipation 10/29/2015  . OA (osteoarthritis) of hip 04/09/2015  . Hypogonadism in male 09/12/2014  . 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 09/29/2013  . Visual field loss 09/29/2013  . Erectile dysfunction 06/25/2012  . Gout 06/25/2012    Guss Bunde, PT, DPT 03/13/2020, 5:48 PM  [facility] [address] [place], [place], [address] Phone: [phone]   Fax:  [phone]  Name: Luke Davila MRN: [id number] Date of Birth: October 03, 1965

## 2020-03-21 ENCOUNTER — Ambulatory Visit: Payer: 59 | Admitting: Physical Therapy

## 2020-03-27 ENCOUNTER — Other Ambulatory Visit: Payer: Self-pay | Admitting: Family Medicine

## 2020-03-27 ENCOUNTER — Other Ambulatory Visit: Payer: Self-pay

## 2020-03-27 ENCOUNTER — Encounter: Payer: Self-pay | Admitting: Physical Therapy

## 2020-03-27 ENCOUNTER — Ambulatory Visit: Payer: 59 | Admitting: Physical Therapy

## 2020-03-27 DIAGNOSIS — M6281 Muscle weakness (generalized): Secondary | ICD-10-CM | POA: Diagnosis not present

## 2020-03-27 DIAGNOSIS — M170 Bilateral primary osteoarthritis of knee: Secondary | ICD-10-CM

## 2020-03-27 DIAGNOSIS — R262 Difficulty in walking, not elsewhere classified: Secondary | ICD-10-CM

## 2020-03-27 NOTE — Therapy (Signed)
[facility] [address] [place], [place], [address] Phone: [phone]   Fax:  [phone]  Physical Therapy Treatment  Patient Details  Name: Luke Davila MRN: [id number] Date of Birth: 09/05/1965 Referring Provider (PT): Mechele Claude, MD   Encounter Date: 03/27/2020   PT End of Session - 03/27/20 1429    Visit Number 2    Number of Visits 12    Date for PT Re-Evaluation 05/01/20    Authorization Type [employer], Progress note every 10th visit    PT Start Time 1431    PT Stop Time 1513    PT Time Calculation (min) 42 min    Activity Tolerance Patient tolerated treatment well;Patient limited by fatigue    Behavior During Therapy [facility] for tasks assessed/performed           Past Medical History:  Diagnosis Date  . Avascular necrosis of hip (HCC)    LEFT  . Enlarged prostate   . Glaucoma   . Gout   . History of gout     Past Surgical History:  Procedure Laterality Date  . COLONOSCOPY WITH PROPOFOL N/A 11/27/2015   Procedure: COLONOSCOPY WITH PROPOFOL;  Surgeon: West Bali, MD;  [facility];  Service: Endoscopy;  Laterality: N/A;  1100  . FOOT SURGERY Left 1985  . Left hand surgery    . TOTAL HIP ARTHROPLASTY Left 04/09/2015   Procedure: LEFT TOTAL HIP ARTHROPLASTY ANTERIOR APPROACH;  Surgeon: Ollen Gross, MD;  Location: [facility];  Service: Orthopedics;  Laterality: Left;    There were no vitals filed for this visit.   Subjective Assessment - 03/27/20 1429    Subjective COVID-19 screening performed upon arrival. Reports feeling tightness prior to coming to PT in anterior hips. Has been sitting a lot today.    Pertinent History PVC, glaucoma, bilateral primary osteoarthritis of knee, left THA    Limitations Standing;Walking;House hold activities    Diagnostic tests n/a    Patient Stated Goals build strength and feel confident with exercises at [employer].    Currently in Pain? No/denies               [facility] PT Assessment - 03/27/20 0001      Assessment   Medical Diagnosis Arthralgia, unspecified    Referring Provider (PT) Mechele Claude, MD    Next MD Visit 6 months      Precautions   Precautions None      Restrictions   Weight Bearing Restrictions No                         [facility] Adult PT Treatment/Exercise - 03/27/20 0001      Exercises   Exercises Knee/Hip      Knee/Hip Exercises: Aerobic   Nustep L3 x15 min      Knee/Hip Exercises: Machines for Strengthening   Cybex Knee Extension 10# 2x10 reps    Cybex Knee Flexion 20# 2x10 reps      Knee/Hip Exercises: Standing   Heel Raises Both;20 reps    Heel Raises Limitations B toe raise x20 reps    Hip Flexion AROM;Both;20 reps;Knee bent    Hip Abduction AROM;Both;15 reps;Knee straight    Hip Extension AROM;Both;10 reps;Knee bent      Knee/Hip Exercises: Seated   Sit to Sand 2 sets;10 reps;without UE support      Knee/Hip Exercises: Supine   Hip Adduction Isometric Strengthening;Both;2 sets;10 reps;Limitations    Hip  Adduction Isometric Limitations 5 sec holds    Bridges Strengthening;15 reps    Straight Leg Raises Strengthening;Both;20 reps    Knee Flexion AROM;Both;20 reps;Limitations    Other Supine Knee/Hip Exercises B hip clam red theraband x30 rpes                       PT Long Term Goals - 03/13/20 1725      PT LONG TERM GOAL #1   Title Patient will be independent with HEP and its progression    Time 6    Period Weeks    Status New      PT LONG TERM GOAL #2   Title Patient will demonstrate 4+/5 or greater bilateral LE MMT to improve stability during functional tasks.    Time 6    Period Weeks    Status New      PT LONG TERM GOAL #3   Title Patient will decrease risk of falls and improve functional LE strength as noted by the ability to perform 5x sit to stand with no UE support in 13 seconds or less.    Time 6    Period Weeks    Status New      PT LONG TERM  GOAL #4   Title Patient will improve aerobic capacity as noted by the ability to walk 300 feet or greater during .    Time 6    Period Weeks    Status New                 Plan - 03/27/20 1522    Clinical Impression Statement Patient presented in clinic with reports of no pain but tightness of anterior LEs. Patient progressed through light strengthening and AROM exercises with fatigue reported frequently during therex session. Decreased stride length and decreased hip/knee flexion notable during gait. B knee flexion notable during stance as well.    Personal Factors and Comorbidities Comorbidity 3+    Comorbidities PVC, glaucoma, bilateral primary osteoarthritis of knee, left THA    Examination-Activity Limitations Stand;Locomotion Level;Transfers;Stairs    Examination-Participation Restrictions Occupation    Stability/Clinical Decision Making Stable/Uncomplicated    Rehab Potential Good    PT Frequency 2x / week    PT Duration 6 weeks    PT Treatment/Interventions ADLs/Self Care Home Management;Gait training;Stair training;Functional mobility training;Therapeutic exercise;Therapeutic activities;Balance training;Neuromuscular re-education;Manual techniques;Passive range of motion;Patient/family education    PT Next Visit Plan nustep, LE strengthening in various positions, balance activities per tolerance. Assess vitals and SaO2.    PT Home Exercise Plan see patient education section    Consulted and Agree with Plan of Care Patient           Patient will benefit from skilled therapeutic intervention in order to improve the following deficits and impairments:  Decreased activity tolerance,Decreased balance,Decreased strength,Pain,Difficulty walking  Visit Diagnosis: Muscle weakness (generalized)  Difficulty in walking, not elsewhere classified     Problem List Patient Active Problem List   Diagnosis Date Noted  . Sarcoidosis of lung (HCC) 09/30/2019  . Primary  insomnia 02/14/2019  . Bilateral primary osteoarthritis of knee 03/24/2018  . PVC's (premature ventricular contractions) 02/03/2018  . Hyperlipidemia 02/03/2018  . Hyponatremia 08/06/2016  . Constipation 10/29/2015  . OA (osteoarthritis) of hip 04/09/2015  . Hypogonadism in male 09/12/2014  . 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 09/29/2013  . Visual field loss 09/29/2013  . Erectile dysfunction 06/25/2012  . Gout 06/25/2012    Adelina Mings  Fatima Blank, PTA 03/27/2020, 3:25 PM  [facility] [address] [place], [place], [address] Phone: [phone]   Fax:  [phone]  Name: Luke Davila MRN: [id number] Date of Birth: 1965-02-10

## 2020-04-04 ENCOUNTER — Ambulatory Visit (INDEPENDENT_AMBULATORY_CARE_PROVIDER_SITE_OTHER): Payer: 59 | Admitting: Family Medicine

## 2020-04-04 ENCOUNTER — Encounter: Payer: Self-pay | Admitting: Family Medicine

## 2020-04-04 ENCOUNTER — Ambulatory Visit (INDEPENDENT_AMBULATORY_CARE_PROVIDER_SITE_OTHER): Payer: 59

## 2020-04-04 ENCOUNTER — Other Ambulatory Visit: Payer: Self-pay

## 2020-04-04 ENCOUNTER — Telehealth: Payer: Self-pay

## 2020-04-04 DIAGNOSIS — H1032 Unspecified acute conjunctivitis, left eye: Secondary | ICD-10-CM

## 2020-04-04 DIAGNOSIS — E291 Testicular hypofunction: Secondary | ICD-10-CM | POA: Diagnosis not present

## 2020-04-04 MED ORDER — TOBRAMYCIN 0.3 % OP SOLN: 1.0000 [drp] | OPHTHALMIC | 1 refills | Status: AC

## 2020-04-04 NOTE — Telephone Encounter (Signed)
Left detailed message on patients voicemail that rx has been sent to pharmacy and to check with them.

## 2020-04-04 NOTE — Telephone Encounter (Signed)
Patient came in for testosterone injection today. He states that he doesn't have any refills left and wants to know if you want him to continue same dose? He recently had labs. Please review and advise

## 2020-04-04 NOTE — Progress Notes (Signed)
Virtual Visit via telephone Note  I connected with Luke Davila on 04/04/20 at 1652 by telephone and verified that I am speaking with the correct person using two identifiers. Luke Davila is currently located at home and patient are currently with her during visit. The provider, Elige Radon Loney Domingo, MD is located in their office at time of visit.  Call ended at 1657  I discussed the limitations, risks, security and privacy concerns of performing an evaluation and management service by telephone and the availability of in person appointments. I also discussed with the patient that there may be a patient responsible charge related to this service. The patient expressed understanding and agreed to proceed.   History and Present Illness: Patient is calling in for eye redness and irritation.  He will have trouble with bright lights.  He has it in his left eye.  He denies any blurred vision or drainage.  He put eyewash in and it helps.  He denies rash.  He has had this before and drops helped.   1. Acute conjunctivitis of left eye, unspecified acute conjunctivitis type     Outpatient Encounter Medications as of 04/04/2020  Medication Sig  . tobramycin (TOBREX) 0.3 % ophthalmic solution Place 1 drop into the left eye every 4 (four) hours for 7 days.  Marland Kitchen albuterol (VENTOLIN HFA) 108 (90 Base) MCG/ACT inhaler Inhale 2 puffs into the lungs every 6 (six) hours as needed for wheezing or shortness of breath.  Marland Kitchen augmented betamethasone dipropionate (DIPROLENE-AF) 0.05 % cream Apply topically 2 (two) times daily. At affected areas (avoid face and genitals)  . diclofenac (VOLTAREN) 75 MG EC tablet TAKE 1 TABLET BY MOUTH TWICE A DAY  . fexofenadine-pseudoephedrine (ALLEGRA-D 24) 180-240 MG 24 hr tablet Take 1 tablet by mouth every evening. For allergy and congestion  . moxifloxacin (AVELOX) 400 MG tablet Take 1 tablet (400 mg total) by mouth daily. Take all of these, for infection  . tamsulosin (FLOMAX)  0.4 MG CAPS capsule Take 2 capsules (0.8 mg total) by mouth daily after supper.  . testosterone cypionate (DEPOTESTOSTERONE CYPIONATE) 200 MG/ML injection Inject 1 mL (200 mg total) into the muscle every 14 (fourteen) days.  Marland Kitchen zolpidem (AMBIEN) 5 MG tablet Take 1 tablet (5 mg total) by mouth at bedtime as needed for sleep.   Facility-Administered Encounter Medications as of 04/04/2020  Medication  . testosterone cypionate (DEPOTESTOSTERONE CYPIONATE) injection 200 mg    Review of Systems  Constitutional: Negative for chills and fever.  HENT: Negative for congestion and sore throat.   Eyes: Positive for photophobia, redness and itching. Negative for pain, discharge and visual disturbance.  Respiratory: Negative for cough, choking, shortness of breath and wheezing.   Cardiovascular: Negative for chest pain and leg swelling.  Musculoskeletal: Negative for back pain and gait problem.  Skin: Negative for rash.  All other systems reviewed and are negative.   Observations/Objective: Patient sounds comfortable and in no acute distress  Assessment and Plan: Problem List Items Addressed This Visit   None   Visit Diagnoses    Acute conjunctivitis of left eye, unspecified acute conjunctivitis type    -  Primary   Relevant Medications   tobramycin (TOBREX) 0.3 % ophthalmic solution      Will send in eyedrops, has done well with them before Follow up plan: Return if symptoms worsen or fail to improve.     I discussed the assessment and treatment plan with the patient. The patient was provided an opportunity  to ask questions and all were answered. The patient agreed with the plan and demonstrated an understanding of the instructions.   The patient was advised to call back or seek an in-person evaluation if the symptoms worsen or if the condition fails to improve as anticipated.  The above assessment and management plan was discussed with the patient. The patient verbalized understanding of  and has agreed to the management plan. Patient is aware to call the clinic if symptoms persist or worsen. Patient is aware when to return to the clinic for a follow-up visit. Patient educated on when it is appropriate to go to the emergency department.    I provided 5 minutes of non-face-to-face time during this encounter.    Nils Pyle, MD

## 2020-04-04 NOTE — Telephone Encounter (Signed)
I sent in a refill good for 10 injections over 20 weeks on March 7. Have him check with his phaarmacy. The dose did not change. His testosterone level came back good.

## 2020-04-05 ENCOUNTER — Other Ambulatory Visit: Payer: Self-pay | Admitting: Family Medicine

## 2020-04-05 MED ORDER — XIIDRA 5 % OP SOLN: 1.0000 [drp] | Freq: Two times a day (BID) | OPHTHALMIC | 5 refills | Status: DC | PRN

## 2020-04-06 ENCOUNTER — Telehealth: Payer: Self-pay | Admitting: *Deleted

## 2020-04-06 NOTE — Telephone Encounter (Signed)
Sam Virgin (Key: [id number]) Rx #: [id number] Benay Spice 5% solution   Form [employer] Electronic Prior Authorization Form [phone] NCPDP)  Sent to plan

## 2020-04-07 ENCOUNTER — Other Ambulatory Visit: Payer: Self-pay | Admitting: Family Medicine

## 2020-04-09 NOTE — Telephone Encounter (Signed)
Request Reference Number: [id number]. XIIDRA DRO 5% is denied for not meeting the prior authorization requirement(s). Details of this decision are in the notice attached below or have been faxed to you. Appeals are not supported through ePA. Please refer to the fax case notice for appeals information and instructions.

## 2020-04-09 NOTE — Telephone Encounter (Signed)
Let Luke Davila know that I couldn't get this approved for him. He will need to get it through his eye doctor in order for insurance to cover it. Thanks,WS

## 2020-04-10 ENCOUNTER — Other Ambulatory Visit: Payer: Self-pay | Admitting: Family Medicine

## 2020-04-10 NOTE — Telephone Encounter (Signed)
lmtcb

## 2020-04-10 NOTE — Telephone Encounter (Signed)
Patient aware and is to contact his eye doctor's office.

## 2020-04-24 ENCOUNTER — Encounter: Payer: Self-pay | Admitting: Family Medicine

## 2020-04-24 ENCOUNTER — Ambulatory Visit: Payer: 59 | Admitting: Family Medicine

## 2020-04-24 VITALS — BP 131/82 | HR 105 | Temp 100.0°F

## 2020-04-24 DIAGNOSIS — E291 Testicular hypofunction: Secondary | ICD-10-CM | POA: Diagnosis not present

## 2020-04-24 DIAGNOSIS — J40 Bronchitis, not specified as acute or chronic: Secondary | ICD-10-CM | POA: Diagnosis not present

## 2020-04-24 DIAGNOSIS — R059 Cough, unspecified: Secondary | ICD-10-CM

## 2020-04-24 DIAGNOSIS — J329 Chronic sinusitis, unspecified: Secondary | ICD-10-CM

## 2020-04-24 MED ORDER — TESTOSTERONE CYPIONATE 200 MG/ML IM SOLN
200.0000 mg | INTRAMUSCULAR | Status: DC
Start: 2020-04-24 — End: 2020-10-09
  Administered 2020-04-24 – 2020-07-31 (×5): 200 mg via INTRAMUSCULAR

## 2020-04-24 MED ORDER — AZITHROMYCIN 250 MG PO TABS: ORAL_TABLET | ORAL | 0 refills | Status: DC

## 2020-04-24 MED ORDER — BETAMETHASONE SOD PHOS & ACET 6 (3-3) MG/ML IJ SUSP
6.0000 mg | Freq: Once | INTRAMUSCULAR | Status: AC
  Administered 2020-04-24: 6 mg via INTRAMUSCULAR

## 2020-04-24 NOTE — Addendum Note (Signed)
Addended by: Adella Hare B on: 04/24/2020 05:08 PM   Modules accepted: Orders

## 2020-04-24 NOTE — Telephone Encounter (Signed)
No return call 

## 2020-04-24 NOTE — Progress Notes (Signed)
Subjective:  Patient ID: Luke Davila, male    DOB: 1965-12-06  Age: 55 y.o. MRN: [id number]  CC: No chief complaint on file.   HPI Luke Davila presents for cough that has made his ribs hurt. Onset three weeks ago. Ribs started hurting on his  right at the bottom front 5 days ago. Decreased appetite with decrease taste sensation started 3 days ago.   Depression screen [facility] 2/9 04/24/2020 03/12/2020 08/15/2019  Decreased Interest 0 0 0  Down, Depressed, Hopeless 0 0 0  PHQ - 2 Score 0 0 0  Some recent data might be hidden    History Luke Davila has a past medical history of Avascular necrosis of hip (HCC), Enlarged prostate, Glaucoma, Gout, and History of gout.   He has a past surgical history that includes Foot surgery (Left, 1985); Left hand surgery; Total hip arthroplasty (Left, 04/09/2015); and Colonoscopy with propofol (N/A, 11/27/2015).   His family history includes Hypertension in his mother.He reports that he has never smoked. He has never used smokeless tobacco. He reports current alcohol use. He reports that he does not use drugs.    ROS Review of Systems  Constitutional: Negative for activity change, appetite change, chills and fever.  HENT: Positive for congestion, postnasal drip and rhinorrhea. Negative for ear discharge, ear pain, hearing loss, nosebleeds, sinus pressure, sneezing and trouble swallowing.   Respiratory: Positive for cough (producing copious mucoid sputum) and shortness of breath. Negative for chest tightness.   Cardiovascular: Negative for palpitations.  Skin: Negative for rash.    Objective:  BP 131/82   Pulse (!) 105   Temp 100 F (37.8 C)   SpO2 98%   BP Readings from Last 3 Encounters:  04/24/20 131/82  03/12/20 132/82  10/19/19 125/77    Wt Readings from Last 3 Encounters:  03/12/20 261 lb (118.4 kg)  10/19/19 265 lb 8 oz (120.4 kg)  08/15/19 251 lb 12.8 oz (114.2 kg)     Physical Exam Vitals reviewed.  Constitutional:       Appearance: He is well-developed. He is ill-appearing.  HENT:     Head: Normocephalic and atraumatic.     Right Ear: Tympanic membrane and external ear normal.     Left Ear: Tympanic membrane and external ear normal.     Nose: Congestion present.     Mouth/Throat:     Pharynx: No oropharyngeal exudate or posterior oropharyngeal erythema.  Eyes:     Pupils: Pupils are equal, round, and reactive to light.  Cardiovascular:     Rate and Rhythm: Normal rate and regular rhythm.     Heart sounds: No murmur heard.   Pulmonary:     Effort: No respiratory distress.     Comments: broncjitic changes throughout Chest:     Chest wall: Tenderness (point tender at right CM, anterior axillary line.) present.  Musculoskeletal:     Cervical back: Normal range of motion and neck supple.  Neurological:     Mental Status: He is alert and oriented to person, place, and time.       Assessment & Plan:   Diagnoses and all orders for this visit:  Cough -     Novel Coronavirus, NAA ([facility])  Sinobronchitis  Other orders -     azithromycin (ZITHROMAX Z-PAK) 250 MG tablet; Take two right away Then one a day for the next 4 days.       I am having Luke Rose "Denard" start on azithromycin. I am also having him  maintain his augmented betamethasone dipropionate, tamsulosin, albuterol, zolpidem, fexofenadine-pseudoephedrine, moxifloxacin, testosterone cypionate, diclofenac, and Xiidra.  Allergies as of 04/24/2020   No Known Allergies     Medication List       Accurate as of April 24, 2020  4:45 PM. If you have any questions, ask your nurse or doctor.        albuterol 108 (90 Base) MCG/ACT inhaler Commonly known as: VENTOLIN HFA Inhale 2 puffs into the lungs every 6 (six) hours as needed for wheezing or shortness of breath.   augmented betamethasone dipropionate 0.05 % cream Commonly known as: DIPROLENE-AF Apply topically 2 (two) times daily. At affected areas (avoid face and  genitals)   azithromycin 250 MG tablet Commonly known as: Zithromax Z-Pak Take two right away Then one a day for the next 4 days. Started by: Mechele Claude, MD   diclofenac 75 MG EC tablet Commonly known as: VOLTAREN TAKE 1 TABLET BY MOUTH TWICE A DAY   fexofenadine-pseudoephedrine 180-240 MG 24 hr tablet Commonly known as: ALLEGRA-D 24 Take 1 tablet by mouth every evening. For allergy and congestion   moxifloxacin 400 MG tablet Commonly known as: Avelox Take 1 tablet (400 mg total) by mouth daily. Take all of these, for infection   tamsulosin 0.4 MG Caps capsule Commonly known as: FLOMAX Take 2 capsules (0.8 mg total) by mouth daily after supper.   testosterone cypionate 200 MG/ML injection Commonly known as: DEPOTESTOSTERONE CYPIONATE Inject 1 mL (200 mg total) into the muscle every 14 (fourteen) days.   Xiidra 5 % Soln Generic drug: Lifitegrast Apply 1 drop to eye every 12 (twelve) hours as needed.   zolpidem 5 MG tablet Commonly known as: AMBIEN Take 1 tablet (5 mg total) by mouth at bedtime as needed for sleep.        Follow-up: Return if symptoms worsen or fail to improve.  Mechele Claude, M.D.

## 2020-04-25 LAB — NOVEL CORONAVIRUS, NAA: SARS-CoV-2, NAA: NOT DETECTED

## 2020-04-25 LAB — SARS-COV-2, NAA 2 DAY TAT

## 2020-04-25 NOTE — Progress Notes (Signed)
Hello Luke Davila,  Your lab result is normal and/or stable.Some minor variations that are not significant are commonly marked abnormal, but do not represent any medical problem for you.  Best regards, Dontarious Schaum, M.D.

## 2020-05-03 ENCOUNTER — Other Ambulatory Visit: Payer: Self-pay | Admitting: Family Medicine

## 2020-05-03 DIAGNOSIS — N401 Enlarged prostate with lower urinary tract symptoms: Secondary | ICD-10-CM

## 2020-05-03 DIAGNOSIS — R351 Nocturia: Secondary | ICD-10-CM

## 2020-05-08 ENCOUNTER — Telehealth: Payer: Self-pay

## 2020-05-08 NOTE — Telephone Encounter (Signed)
Please set up the CXR for him

## 2020-05-09 ENCOUNTER — Other Ambulatory Visit: Payer: Self-pay | Admitting: *Deleted

## 2020-05-09 DIAGNOSIS — R059 Cough, unspecified: Secondary | ICD-10-CM

## 2020-05-09 NOTE — Telephone Encounter (Signed)
Chest xray ordered, called patient, no answer, left message to return call

## 2020-05-11 ENCOUNTER — Other Ambulatory Visit (INDEPENDENT_AMBULATORY_CARE_PROVIDER_SITE_OTHER): Payer: 59

## 2020-05-11 ENCOUNTER — Other Ambulatory Visit: Payer: Self-pay

## 2020-05-11 DIAGNOSIS — R059 Cough, unspecified: Secondary | ICD-10-CM | POA: Diagnosis not present

## 2020-05-14 ENCOUNTER — Other Ambulatory Visit: Payer: Self-pay | Admitting: Family Medicine

## 2020-05-14 DIAGNOSIS — J849 Interstitial pulmonary disease, unspecified: Secondary | ICD-10-CM

## 2020-05-14 NOTE — Telephone Encounter (Signed)
XRAY COMPLETED 05/11/20

## 2020-05-16 ENCOUNTER — Telehealth: Payer: Self-pay | Admitting: Family Medicine

## 2020-05-21 ENCOUNTER — Other Ambulatory Visit: Payer: Self-pay | Admitting: Family Medicine

## 2020-05-21 DIAGNOSIS — M170 Bilateral primary osteoarthritis of knee: Secondary | ICD-10-CM

## 2020-05-29 ENCOUNTER — Ambulatory Visit: Payer: 59

## 2020-05-30 ENCOUNTER — Ambulatory Visit (HOSPITAL_COMMUNITY): Payer: 59

## 2020-05-30 ENCOUNTER — Ambulatory Visit: Payer: 59

## 2020-05-30 ENCOUNTER — Ambulatory Visit (INDEPENDENT_AMBULATORY_CARE_PROVIDER_SITE_OTHER): Payer: 59 | Admitting: *Deleted

## 2020-05-30 ENCOUNTER — Other Ambulatory Visit: Payer: Self-pay

## 2020-05-30 DIAGNOSIS — E291 Testicular hypofunction: Secondary | ICD-10-CM | POA: Diagnosis not present

## 2020-05-30 NOTE — Progress Notes (Signed)
Testosterone injection given and patient tolerated well.  

## 2020-05-31 ENCOUNTER — Encounter: Payer: Self-pay | Admitting: Family

## 2020-05-31 ENCOUNTER — Ambulatory Visit: Payer: 59 | Admitting: Family

## 2020-05-31 DIAGNOSIS — D860 Sarcoidosis of lung: Secondary | ICD-10-CM | POA: Diagnosis not present

## 2020-05-31 DIAGNOSIS — R059 Cough, unspecified: Secondary | ICD-10-CM

## 2020-05-31 LAB — VERITOR FLU A/B WAIVED
Influenza A: NEGATIVE
Influenza B: NEGATIVE

## 2020-05-31 MED ORDER — PREDNISONE 10 MG (21) PO TBPK
ORAL_TABLET | ORAL | 0 refills | Status: DC
Start: 2020-05-31 — End: 2020-06-27

## 2020-05-31 MED ORDER — BENZONATATE 200 MG PO CAPS: 200.0000 mg | ORAL_CAPSULE | Freq: Three times a day (TID) | ORAL | 1 refills | Status: DC | PRN

## 2020-05-31 NOTE — Progress Notes (Signed)
Virtual Visit  Note Due to COVID-19 pandemic this visit was conducted virtually. This visit type was conducted due to national recommendations for restrictions regarding the COVID-19 Pandemic (e.g. social distancing, sheltering in place) in an effort to limit this patient's exposure and mitigate transmission in our community. All issues noted in this document were discussed and addressed.  A physical exam was not performed with this format.  I connected with Luke Davila on 05/31/20 at 3:06 pm  by telephone and verified that I am speaking with the correct person using two identifiers. Luke Davila is currently located at car and no one is currently with him during visit. The provider, Jannifer Rodney, FNP is located in their office at time of visit.  I discussed the limitations, risks, security and privacy concerns of performing an evaluation and management service by telephone and the availability of in person appointments. I also discussed with the patient that there may be a patient responsible charge related to this service. The patient expressed understanding and agreed to proceed.   History and Present Illness:  Cough This is a new problem. The current episode started in the past 7 days. The problem has been waxing and waning. The problem occurs every few minutes. The cough is productive of purulent sputum. Associated symptoms include chills, ear pain, nasal congestion, shortness of breath and wheezing. Pertinent negatives include no ear congestion, fever, headaches, myalgias, postnasal drip or sore throat. The symptoms are aggravated by lying down. He has tried rest and steroid inhaler for the symptoms. The treatment provided moderate relief.      Review of Systems  Constitutional: Positive for chills. Negative for fever.  HENT: Positive for ear pain. Negative for postnasal drip and sore throat.   Respiratory: Positive for cough, shortness of breath and wheezing.   Musculoskeletal:  Negative for myalgias.  Neurological: Negative for headaches.     Observations/Objective: No SOB or distress noted, hoarse voice.   Assessment and Plan: 1. Cough - Take meds as prescribed - Use a cool mist humidifier  -Use saline nose sprays frequently -Force fluids -For any cough or congestion  Use plain Mucinex- regular strength or max strength is fine -For fever or aces or pains- take tylenol or ibuprofen. -Throat lozenges if help -RTO if symptoms worsen or do not improve  - Novel Coronavirus, NAA ([facility]) - Veritor Flu A/B Waived - predniSONE (STERAPRED UNI-PAK 21 TAB) 10 MG (21) TBPK tablet; Use as directed  Dispense: 21 tablet; Refill: 0 - benzonatate (TESSALON) 200 MG capsule; Take 1 capsule (200 mg total) by mouth 3 (three) times daily as needed.  Dispense: 30 capsule; Refill: 1  2. Sarcoidosis of lung (HCC)     I discussed the assessment and treatment plan with the patient. The patient was provided an opportunity to ask questions and all were answered. The patient agreed with the plan and demonstrated an understanding of the instructions.   The patient was advised to call back or seek an in-person evaluation if the symptoms worsen or if the condition fails to improve as anticipated.  The above assessment and management plan was discussed with the patient. The patient verbalized understanding of and has agreed to the management plan. Patient is aware to call the clinic if symptoms persist or worsen. Patient is aware when to return to the clinic for a follow-up visit. Patient educated on when it is appropriate to go to the emergency department.   Time call ended:  3:18 pm   I  provided 12 minutes of  non face-to-face time during this encounter.    Evelina Dun, FNP

## 2020-06-01 LAB — NOVEL CORONAVIRUS, NAA: SARS-CoV-2, NAA: NOT DETECTED

## 2020-06-01 LAB — SARS-COV-2, NAA 2 DAY TAT

## 2020-06-07 ENCOUNTER — Other Ambulatory Visit: Payer: Self-pay | Admitting: Family Medicine

## 2020-06-07 DIAGNOSIS — J849 Interstitial pulmonary disease, unspecified: Secondary | ICD-10-CM

## 2020-06-13 ENCOUNTER — Ambulatory Visit: Payer: 59

## 2020-06-19 ENCOUNTER — Encounter: Payer: Self-pay | Admitting: Family Medicine

## 2020-06-21 ENCOUNTER — Other Ambulatory Visit: Payer: 59

## 2020-06-26 ENCOUNTER — Ambulatory Visit (INDEPENDENT_AMBULATORY_CARE_PROVIDER_SITE_OTHER): Payer: 59 | Admitting: *Deleted

## 2020-06-26 ENCOUNTER — Other Ambulatory Visit: Payer: Self-pay

## 2020-06-26 DIAGNOSIS — E291 Testicular hypofunction: Secondary | ICD-10-CM | POA: Diagnosis not present

## 2020-06-27 ENCOUNTER — Encounter: Payer: Self-pay | Admitting: Family Medicine

## 2020-06-27 ENCOUNTER — Ambulatory Visit (INDEPENDENT_AMBULATORY_CARE_PROVIDER_SITE_OTHER): Payer: 59 | Admitting: Family Medicine

## 2020-06-27 VITALS — BP 136/86 | HR 80 | Temp 97.7°F | Ht 75.0 in | Wt 260.6 lb

## 2020-06-27 DIAGNOSIS — G8929 Other chronic pain: Secondary | ICD-10-CM

## 2020-06-27 DIAGNOSIS — M25561 Pain in right knee: Secondary | ICD-10-CM

## 2020-06-27 DIAGNOSIS — M1631 Unilateral osteoarthritis resulting from hip dysplasia, right hip: Secondary | ICD-10-CM

## 2020-06-27 DIAGNOSIS — M25562 Pain in left knee: Secondary | ICD-10-CM

## 2020-06-28 ENCOUNTER — Telehealth: Payer: Self-pay | Admitting: Family Medicine

## 2020-06-28 LAB — CMP14+EGFR
ALT: 17 IU/L (ref 0–44)
AST: 33 IU/L (ref 0–40)
Albumin/Globulin Ratio: 1.7 (ref 1.2–2.2)
Albumin: 4.3 g/dL (ref 3.8–4.9)
Alkaline Phosphatase: 138 IU/L — ABNORMAL HIGH (ref 44–121)
BUN/Creatinine Ratio: 8 — ABNORMAL LOW (ref 9–20)
BUN: 13 mg/dL (ref 6–24)
Bilirubin Total: 1.2 mg/dL (ref 0.0–1.2)
CO2: 21 mmol/L (ref 20–29)
Calcium: 9 mg/dL (ref 8.7–10.2)
Chloride: 99 mmol/L (ref 96–106)
Creatinine, Ser: 1.6 mg/dL — ABNORMAL HIGH (ref 0.76–1.27)
Globulin, Total: 2.6 g/dL (ref 1.5–4.5)
Glucose: 77 mg/dL (ref 65–99)
Potassium: 4.1 mmol/L (ref 3.5–5.2)
Sodium: 138 mmol/L (ref 134–144)
Total Protein: 6.9 g/dL (ref 6.0–8.5)
eGFR: 51 mL/min/{1.73_m2} — ABNORMAL LOW (ref >59)

## 2020-06-28 LAB — CBC WITH DIFFERENTIAL/PLATELET
Basophils Absolute: 0.1 10*3/uL (ref 0.0–0.2)
Basos: 1 %
EOS (ABSOLUTE): 0.3 10*3/uL (ref 0.0–0.4)
Eos: 6 %
Hematocrit: 39.7 % (ref 37.5–51.0)
Hemoglobin: 13.6 g/dL (ref 13.0–17.7)
Immature Grans (Abs): 0 10*3/uL (ref 0.0–0.1)
Immature Granulocytes: 0 %
Lymphocytes Absolute: 1.2 10*3/uL (ref 0.7–3.1)
Lymphs: 26 %
MCH: 26.4 pg — ABNORMAL LOW (ref 26.6–33.0)
MCHC: 34.3 g/dL (ref 31.5–35.7)
MCV: 77 fL — ABNORMAL LOW (ref 79–97)
Monocytes Absolute: 0.7 10*3/uL (ref 0.1–0.9)
Monocytes: 15 %
Neutrophils Absolute: 2.5 10*3/uL (ref 1.4–7.0)
Neutrophils: 52 %
Platelets: 211 10*3/uL (ref 150–450)
RBC: 5.15 x10E6/uL (ref 4.14–5.80)
RDW: 13.9 % (ref 11.6–15.4)
WBC: 4.8 10*3/uL (ref 3.4–10.8)

## 2020-06-28 LAB — SEDIMENTATION RATE: Sed Rate: 21 mm/hr (ref 0–30)

## 2020-06-29 ENCOUNTER — Other Ambulatory Visit: Payer: Self-pay | Admitting: Family Medicine

## 2020-06-29 MED ORDER — PREDNISONE 10 MG PO TABS: ORAL_TABLET | ORAL | 0 refills | Status: DC

## 2020-06-29 MED ORDER — PREDNISONE 20 MG PO TABS: ORAL_TABLET | ORAL | 0 refills | Status: DC

## 2020-06-29 NOTE — Telephone Encounter (Signed)
Pt called back stating that he needs Dr Darlyn Read to call his Prednisone Rx in ASAP. Says he was supposed to do that at his visit on 06/27/20 but [facility] in [place] doesn't have it.

## 2020-06-29 NOTE — Telephone Encounter (Signed)
Pt called back stating that he thought Dr Darlyn Read said he wanted him to be on the Prednisone for 30 days? Says he is confused because the pharmacy told him that Dr Darlyn Read only called in a 15 day supply? Wants to know if Rx has correct directions/number of tablets and if Dr Darlyn Read plans on calling him in a refill after he finishes taking the Rx for the first 15 days?  Please call patient to clarify.

## 2020-07-02 ENCOUNTER — Ambulatory Visit
Admission: RE | Admit: 2020-07-02 | Discharge: 2020-07-02 | Disposition: A | Discharge status: Home / Self Care | Payer: 59 | Source: Ambulatory Visit | Attending: Family Medicine | Admitting: Family Medicine

## 2020-07-02 ENCOUNTER — Encounter: Payer: Self-pay | Admitting: Family Medicine

## 2020-07-02 DIAGNOSIS — J849 Interstitial pulmonary disease, unspecified: Secondary | ICD-10-CM

## 2020-07-02 NOTE — Progress Notes (Signed)
Hello Arlis,  Your lab result is normal and/or stable.Some minor variations that are not significant are commonly marked abnormal, but do not represent any medical problem for you.  Best regards, Codi Folkerts, M.D.

## 2020-07-05 ENCOUNTER — Telehealth: Payer: Self-pay | Admitting: Family Medicine

## 2020-07-05 NOTE — Telephone Encounter (Signed)
Some minor variations from normal are present. Everything is stable.

## 2020-07-06 NOTE — Telephone Encounter (Signed)
PATIENT AWARE

## 2020-07-10 ENCOUNTER — Ambulatory Visit: Payer: Self-pay

## 2020-07-11 ENCOUNTER — Other Ambulatory Visit: Payer: Self-pay

## 2020-07-11 ENCOUNTER — Ambulatory Visit (INDEPENDENT_AMBULATORY_CARE_PROVIDER_SITE_OTHER): Payer: 59

## 2020-07-11 DIAGNOSIS — E291 Testicular hypofunction: Secondary | ICD-10-CM | POA: Diagnosis not present

## 2020-07-11 NOTE — Progress Notes (Signed)
Testosterone injection given to left upper outer quadrant.  Patient tolerated well. 

## 2020-07-13 ENCOUNTER — Other Ambulatory Visit: Payer: Self-pay | Admitting: Family Medicine

## 2020-07-13 DIAGNOSIS — E291 Testicular hypofunction: Secondary | ICD-10-CM

## 2020-07-16 ENCOUNTER — Ambulatory Visit: Payer: 59 | Admitting: Family Medicine

## 2020-07-24 ENCOUNTER — Other Ambulatory Visit: Payer: Self-pay | Admitting: Family Medicine

## 2020-07-31 ENCOUNTER — Encounter: Payer: Self-pay | Admitting: Family Medicine

## 2020-07-31 ENCOUNTER — Other Ambulatory Visit: Payer: Self-pay

## 2020-07-31 ENCOUNTER — Ambulatory Visit: Payer: 59 | Admitting: Family Medicine

## 2020-07-31 VITALS — BP 157/90 | HR 70 | Temp 97.7°F | Ht 75.0 in | Wt 263.0 lb

## 2020-07-31 DIAGNOSIS — E291 Testicular hypofunction: Secondary | ICD-10-CM | POA: Diagnosis not present

## 2020-07-31 DIAGNOSIS — R369 Urethral discharge, unspecified: Secondary | ICD-10-CM | POA: Diagnosis not present

## 2020-07-31 DIAGNOSIS — R631 Polydipsia: Secondary | ICD-10-CM | POA: Diagnosis not present

## 2020-07-31 DIAGNOSIS — M170 Bilateral primary osteoarthritis of knee: Secondary | ICD-10-CM | POA: Diagnosis not present

## 2020-07-31 LAB — BAYER DCA HB A1C WAIVED: HB A1C (BAYER DCA - WAIVED): 5.7 % (ref <7.0)

## 2020-07-31 MED ORDER — DICLOFENAC SODIUM 75 MG PO TBEC: 75.0000 mg | DELAYED_RELEASE_TABLET | Freq: Two times a day (BID) | ORAL | 5 refills | Status: DC

## 2020-07-31 NOTE — Progress Notes (Signed)
Subjective:  Patient ID: Luke Davila, male    DOB: [birth year]  Age: 55 y.o. MRN: [id number]  CC: Follow-up   HPI Luke Davila presents for recheck of knee pain. Much better today compared to last month. Taking his last prednisone today. Going to [employer] frequently. .  Having some urethral discharge over the last few days.  He used a public toilet that was not clean a few days before that.  He is concerned about infection.  He is very worried about it.  He says that is why he has a little bit high blood pressure today.  Its not usually this high.  Depression screen [facility] 2/9 07/31/2020 07/31/2020 06/27/2020  Decreased Interest 2 0 0  Down, Depressed, Hopeless 1 0 0  PHQ - 2 Score 3 0 0  Altered sleeping 3 - -  Tired, decreased energy 1 - -  Change in appetite 0 - -  Feeling bad or failure about yourself  0 - -  Trouble concentrating 0 - -  Moving slowly or fidgety/restless 0 - -  Suicidal thoughts 0 - -  PHQ-9 Score 7 - -  Difficult doing work/chores Not difficult at all - -  Some recent data might be hidden    History Luke Davila has a past medical history of Avascular necrosis of hip (HCC), Enlarged prostate, Glaucoma, Gout, and History of gout.   He has a past surgical history that includes Foot surgery (Left, 1985); Left hand surgery; Total hip arthroplasty (Left, 04/09/2015); and Colonoscopy with propofol (N/A, 11/27/2015).   His family history includes Hypertension in his mother.He reports that he has never smoked. He has never used smokeless tobacco. He reports current alcohol use. He reports that he does not use drugs.    ROS Review of Systems  Constitutional:  Negative for fever.  Respiratory:  Negative for shortness of breath.   Cardiovascular:  Negative for chest pain.  Genitourinary:  Positive for penile discharge.  Musculoskeletal:  Negative for arthralgias.  Skin:  Negative for rash.   Objective:  BP (!) 157/90   Pulse 70   Temp 97.7 F (36.5 C)   Ht  6\' 3"  (1.905 m)   Wt 263 lb (119.3 kg)   SpO2 96%   BMI 32.87 kg/m   BP Readings from Last 3 Encounters:  07/31/20 (!) 157/90  06/27/20 136/86  04/24/20 131/82    Wt Readings from Last 3 Encounters:  07/31/20 263 lb (119.3 kg)  06/27/20 260 lb 9.6 oz (118.2 kg)  03/12/20 261 lb (118.4 kg)     Physical Exam Vitals reviewed.  Constitutional:      Appearance: He is well-developed.  HENT:     Head: Normocephalic and atraumatic.     Right Ear: External ear normal.     Left Ear: External ear normal.     Mouth/Throat:     Pharynx: No oropharyngeal exudate or posterior oropharyngeal erythema.  Eyes:     Pupils: Pupils are equal, round, and reactive to light.  Cardiovascular:     Rate and Rhythm: Normal rate and regular rhythm.     Heart sounds: No murmur heard. Pulmonary:     Effort: No respiratory distress.     Breath sounds: Normal breath sounds.  Musculoskeletal:     Cervical back: Normal range of motion and neck supple.  Neurological:     Mental Status: He is alert and oriented to person, place, and time.      Assessment & Plan:  Luke Davila was seen today for follow-up.  Diagnoses and all orders for this visit:  Polydipsia -     Bayer DCA Hb A1c Waived  Bilateral primary osteoarthritis of knee -     diclofenac (VOLTAREN) 75 MG EC tablet; Take 1 tablet (75 mg total) by mouth 2 (two) times daily.  Penile discharge -     Urinalysis -     Urine Culture -     GC/Chlamydia Probe Amp -     HIV antibody (with reflex)      I have discontinued Matthew Folks. Hankins "Denard"'s predniSONE. I have also changed his diclofenac. Additionally, I am having him maintain his augmented betamethasone dipropionate, albuterol, zolpidem, tamsulosin, and testosterone cypionate. We will continue to administer testosterone cypionate.  Allergies as of 07/31/2020   No Known Allergies      Medication List        Accurate as of July 31, 2020  5:10 PM. If you have any questions, ask  your nurse or doctor.          STOP taking these medications    predniSONE 20 MG tablet Commonly known as: DELTASONE Stopped by: Mechele Claude, MD       TAKE these medications    albuterol 108 (90 Base) MCG/ACT inhaler Commonly known as: VENTOLIN HFA Inhale 2 puffs into the lungs every 6 (six) hours as needed for wheezing or shortness of breath.   augmented betamethasone dipropionate 0.05 % cream Commonly known as: DIPROLENE-AF Apply topically 2 (two) times daily. At affected areas (avoid face and genitals)   diclofenac 75 MG EC tablet Commonly known as: VOLTAREN Take 1 tablet (75 mg total) by mouth 2 (two) times daily.   tamsulosin 0.4 MG Caps capsule Commonly known as: FLOMAX TAKE 2 CAPSULES (0.8 MG TOTAL) BY MOUTH DAILY AFTER SUPPER.   testosterone cypionate 200 MG/ML injection Commonly known as: DEPOTESTOSTERONE CYPIONATE INJECT 1 ML (200 MG TOTAL) INTO THE MUSCLE EVERY 14 (FOURTEEN) DAYS.   zolpidem 5 MG tablet Commonly known as: AMBIEN Take 1 tablet (5 mg total) by mouth at bedtime as needed for sleep.         Follow-up: No follow-ups on file.  Mechele Claude, M.D.

## 2020-08-08 ENCOUNTER — Other Ambulatory Visit: Payer: 59

## 2020-08-08 ENCOUNTER — Other Ambulatory Visit: Payer: Self-pay

## 2020-08-08 LAB — URINALYSIS
Bilirubin, UA: NEGATIVE
Glucose, UA: NEGATIVE
Ketones, UA: NEGATIVE
Leukocytes,UA: NEGATIVE
Nitrite, UA: NEGATIVE
Protein,UA: NEGATIVE
Specific Gravity, UA: <1.005 — ABNORMAL LOW (ref 1.005–1.030)
Urobilinogen, Ur: 0.2 mg/dL (ref 0.2–1.0)
pH, UA: 6 (ref 5.0–7.5)

## 2020-08-10 LAB — URINE CULTURE

## 2020-08-16 ENCOUNTER — Other Ambulatory Visit: Payer: Self-pay | Admitting: Family Medicine

## 2020-08-16 DIAGNOSIS — N401 Enlarged prostate with lower urinary tract symptoms: Secondary | ICD-10-CM

## 2020-08-16 DIAGNOSIS — R351 Nocturia: Secondary | ICD-10-CM

## 2020-08-19 ENCOUNTER — Encounter: Payer: Self-pay | Admitting: Family Medicine

## 2020-08-21 ENCOUNTER — Other Ambulatory Visit: Payer: Self-pay | Admitting: Family Medicine

## 2020-08-21 MED ORDER — PREDNISONE 10 MG PO TABS: ORAL_TABLET | ORAL | 0 refills | Status: DC

## 2020-08-27 ENCOUNTER — Other Ambulatory Visit: Payer: Self-pay | Admitting: Family Medicine

## 2020-08-27 DIAGNOSIS — E291 Testicular hypofunction: Secondary | ICD-10-CM

## 2020-08-30 ENCOUNTER — Other Ambulatory Visit: Payer: Self-pay | Admitting: Family Medicine

## 2020-08-30 DIAGNOSIS — E291 Testicular hypofunction: Secondary | ICD-10-CM

## 2020-09-07 ENCOUNTER — Telehealth: Payer: Self-pay

## 2020-09-07 NOTE — Telephone Encounter (Signed)
KeyJaclyn Prime - PA Case ID: [id number] -   Rx #: [id number] Need help? Call us at [phone]  Status   Sent to [facility]  Drug    Testosterone Cypionate 200MG /ML intramuscular solution  Form [facility] Electronic Prior Authorization Form (2017 NCPDP) Original Claim Info [address] 61 ePA at [email or web address] Requires Prior Authorization

## 2020-09-11 NOTE — Telephone Encounter (Signed)
DECISION NOTES AND DETAILS Here is a breakdown of why this request was denied. It includes the criteria used to make the decision. The provider is also getting a copy of this information. If they would like, they can call us to set up a time to talk through this decision with another provider, called a peer-to-peer review.  You may want to bring this letter with you on your next visit. The information can be very detailed. The doctor may be able to help you understand it. The denial was based on our criteria for Testost Cyp Inj 200mg /Ml.  Diagnosis :Hypogonadism (A reduction or absence of hormone secretion or other physiological activity of the gonads) Quantity: 54ml Per your health plan's criteria, this drug is covered if you meet the following: One of the following: (1) Your follow-up lab test is within the normal range or still low (for new to therapy: check the total serum testosterone level within the past 6 months; for continuation of therapy: check the serum testosterone level within 12 months). (2) Your follow-up lab test is higher than the normal range and your drug dose has changed (for new to therapy: check the total serum testosterone level within the past 6 months; for continuation of therapy: check the serum testosterone level within 12 months). (3) You have a condition that may cause a change in a protein that binds to sex hormone (sex-hormone binding globulin) (for example: thyroid disorder, human immunodeficiency virus disease, a liver disorder, diabetes, obesity). The information provided does not show that you meet the criteria listed above. Please speak with your doctor about your choices. Reviewed by: 11m, R.Ph. Case number: [id number]

## 2020-09-11 NOTE — Telephone Encounter (Signed)
Deniedon September 2 Request Reference Number: [id number]. TESTOST CYP INJ 200MG /ML is denied for not meeting the prior authorization requirement(s). Details of this decision are in the notice attached below or have been faxed to you. Appeals are not supported through ePA. Please refer to the fax case notice for appeals information and instructions

## 2020-09-12 ENCOUNTER — Other Ambulatory Visit: Payer: Self-pay | Admitting: Family Medicine

## 2020-09-12 DIAGNOSIS — E291 Testicular hypofunction: Secondary | ICD-10-CM

## 2020-09-12 NOTE — Telephone Encounter (Signed)
To get testosterone refill, he will have to come in for a testosterone blood level.

## 2020-09-25 ENCOUNTER — Other Ambulatory Visit: Payer: Self-pay | Admitting: Family Medicine

## 2020-09-25 DIAGNOSIS — G8929 Other chronic pain: Secondary | ICD-10-CM

## 2020-09-25 DIAGNOSIS — M25562 Pain in left knee: Secondary | ICD-10-CM

## 2020-10-03 ENCOUNTER — Telehealth: Payer: Self-pay | Admitting: Family Medicine

## 2020-10-03 NOTE — Telephone Encounter (Signed)
Spoke with patient's spouse, appointment scheduled for patient to come in tomorrow at 7:55 am to see Dr. Darlyn Read.

## 2020-10-04 ENCOUNTER — Ambulatory Visit: Payer: 59 | Admitting: Family Medicine

## 2020-10-04 ENCOUNTER — Telehealth: Payer: 59 | Admitting: Family Medicine

## 2020-10-04 ENCOUNTER — Encounter: Payer: Self-pay | Admitting: Family Medicine

## 2020-10-04 ENCOUNTER — Other Ambulatory Visit: Payer: Self-pay

## 2020-10-04 VITALS — BP 157/90 | HR 90 | Temp 96.9°F | Ht 75.0 in | Wt 258.2 lb

## 2020-10-04 DIAGNOSIS — R32 Unspecified urinary incontinence: Secondary | ICD-10-CM | POA: Diagnosis not present

## 2020-10-04 DIAGNOSIS — R0600 Dyspnea, unspecified: Secondary | ICD-10-CM

## 2020-10-04 DIAGNOSIS — I10 Essential (primary) hypertension: Secondary | ICD-10-CM

## 2020-10-04 DIAGNOSIS — E291 Testicular hypofunction: Secondary | ICD-10-CM

## 2020-10-04 DIAGNOSIS — R0609 Other forms of dyspnea: Secondary | ICD-10-CM

## 2020-10-04 DIAGNOSIS — N3941 Urge incontinence: Secondary | ICD-10-CM | POA: Diagnosis not present

## 2020-10-04 LAB — URINALYSIS
Bilirubin, UA: NEGATIVE
Glucose, UA: NEGATIVE
Ketones, UA: NEGATIVE
Leukocytes,UA: NEGATIVE
Nitrite, UA: NEGATIVE
Protein,UA: NEGATIVE
Specific Gravity, UA: <1.005 — ABNORMAL LOW (ref 1.005–1.030)
Urobilinogen, Ur: 0.2 mg/dL (ref 0.2–1.0)
pH, UA: 5.5 (ref 5.0–7.5)

## 2020-10-04 MED ORDER — AMLODIPINE BESYLATE 5 MG PO TABS: 5.0000 mg | ORAL_TABLET | Freq: Every day | ORAL | 5 refills | Status: DC

## 2020-10-04 MED ORDER — SOLIFENACIN SUCCINATE 10 MG PO TABS: 10.0000 mg | ORAL_TABLET | Freq: Every day | ORAL | 3 refills | Status: DC

## 2020-10-04 NOTE — Progress Notes (Signed)
Subjective:  Patient ID: Luke Davila, male    DOB: [birth year]  Age: 55 y.o. MRN: [id number]  CC: Hypertension  on HPI Luke Davila presents for onset 1 month ago of urinary incontinence. Having to wear Depends at night for enuresis. Losing urine during the day. Can't hold it. Not frequency. No dysuria or groin pain. No change in BM. Out of tamsulosin for a week. No abdominal pain or fever.  Shortness of breath for three weeks. DOE with about 100 feet level or one flight of stairs.   Hasn't had testosterone since last injection on 7/26.   Depression screen [facility] 2/9 10/04/2020 10/04/2020 07/31/2020  Decreased Interest 1 0 2  Down, Depressed, Hopeless 1 0 1  PHQ - 2 Score 2 0 3  Altered sleeping 1 - 3  Tired, decreased energy 1 - 1  Change in appetite 0 - 0  Feeling bad or failure about yourself  0 - 0  Trouble concentrating 0 - 0  Moving slowly or fidgety/restless 1 - 0  Suicidal thoughts 0 - 0  PHQ-9 Score 5 - 7  Difficult doing work/chores Not difficult at all - Not difficult at all  Some recent data might be hidden    History Luke Davila has a past medical history of Avascular necrosis of hip ([place]), Enlarged prostate, Glaucoma, Gout, and History of gout.   Luke Davila has a past surgical history that includes Foot surgery (Left, 1985); Left hand surgery; Total hip arthroplasty (Left, 04/09/2015); and Colonoscopy with propofol (N/A, 11/27/2015).   His family history includes Hypertension in his mother.Luke Davila reports that Luke Davila has never smoked. Luke Davila has never used smokeless tobacco. Luke Davila reports current alcohol use. Luke Davila reports that Luke Davila does not use drugs.    ROS Review of Systems  Constitutional:  Positive for activity change. Negative for fever.  Respiratory:  Positive for shortness of breath.   Cardiovascular:  Negative for chest pain.  Genitourinary:  Positive for enuresis and urgency. Negative for decreased urine volume, dysuria, flank pain, frequency, hematuria, penile pain, penile swelling,  scrotal swelling and testicular pain.  Musculoskeletal:  Positive for arthralgias (legs, getting PT for that).  Skin:  Negative for rash.   Objective:  BP (!) 157/90   Pulse 90   Temp (!) 96.9 F (36.1 C)   Ht _0  (1.905 m)   Wt 258 lb 3.2 oz (117.1 kg)   SpO2 99%   BMI 32.27 kg/m   BP Readings from Last 3 Encounters:  10/04/20 (!) 157/90  07/31/20 (!) 157/90  06/27/20 136/86    Wt Readings from Last 3 Encounters:  10/04/20 258 lb 3.2 oz (117.1 kg)  07/31/20 263 lb (119.3 kg)  06/27/20 260 lb 9.6 oz (118.2 kg)     Physical Exam Vitals reviewed.  Constitutional:      Appearance: Luke Davila is well-developed.  HENT:     Head: Normocephalic and atraumatic.     Right Ear: External ear normal.     Left Ear: External ear normal.     Mouth/Throat:     Pharynx: No oropharyngeal exudate or posterior oropharyngeal erythema.  Eyes:     Pupils: Pupils are equal, round, and reactive to light.  Cardiovascular:     Rate and Rhythm: Normal rate and regular rhythm.     Heart sounds: No murmur heard. Pulmonary:     Effort: No respiratory distress.     Breath sounds: Normal breath sounds.  Musculoskeletal:     Cervical back: Normal range  of motion and neck supple.  Neurological:     Mental Status: Luke Davila is alert and oriented to person, place, and time.      Assessment & Plan:   Luke Davila was seen today for hypertension.  Diagnoses and all orders for this visit:  Urge incontinence -     CBC with Differential/Platelet -     CMP14+EGFR -     Urinalysis -     Urine Culture -     Testosterone,Free and Total  Enuresis -     CBC with Differential/Platelet -     CMP14+EGFR -     Urinalysis -     Urine Culture -     Testosterone,Free and Total  Dyspnea on exertion -     D-dimer, quantitative  Essential hypertension  Hypogonadism in male -     CBC with Differential/Platelet -     CMP14+EGFR -     Testosterone,Free and Total  Other orders -     solifenacin (VESICARE) 10 MG  tablet; Take 1 tablet (10 mg total) by mouth daily. -     amLODipine (NORVASC) 5 MG tablet; Take 1 tablet (5 mg total) by mouth daily. For blood pressure      I have discontinued Luke Davila. Luke "Denard"'s predniSONE. I am also having him start on solifenacin and amLODipine. Additionally, I am having him maintain his augmented betamethasone dipropionate, albuterol, zolpidem, diclofenac, tamsulosin, and testosterone cypionate. We will continue to administer testosterone cypionate.  Allergies as of 10/04/2020   No Known Allergies      Medication List        Accurate as of October 04, 2020  8:56 AM. If you have any questions, ask your nurse or doctor.          STOP taking these medications    predniSONE 10 MG tablet Commonly known as: DELTASONE Stopped by: Claretta Fraise, MD       TAKE these medications    albuterol 108 (90 Base) MCG/ACT inhaler Commonly known as: VENTOLIN HFA Inhale 2 puffs into the lungs every 6 (six) hours as needed for wheezing or shortness of breath.   amLODipine 5 MG tablet Commonly known as: NORVASC Take 1 tablet (5 mg total) by mouth daily. For blood pressure Started by: Claretta Fraise, MD   augmented betamethasone dipropionate 0.05 % cream Commonly known as: DIPROLENE-AF Apply topically 2 (two) times daily. At affected areas (avoid face and genitals)   diclofenac 75 MG EC tablet Commonly known as: VOLTAREN Take 1 tablet (75 mg total) by mouth 2 (two) times daily.   solifenacin 10 MG tablet Commonly known as: VESICARE Take 1 tablet (10 mg total) by mouth daily. Started by: Claretta Fraise, MD   tamsulosin 0.4 MG Caps capsule Commonly known as: FLOMAX TAKE 2 CAPSULES (0.8 MG TOTAL) BY MOUTH DAILY AFTER SUPPER.   testosterone cypionate 200 MG/ML injection Commonly known as: DEPOTESTOSTERONE CYPIONATE INJECT 1 ML (200 MG TOTAL) INTO THE MUSCLE EVERY 14 (FOURTEEN) DAYS.   zolpidem 5 MG tablet Commonly known as: AMBIEN Take 1 tablet (5  mg total) by mouth at bedtime as needed for sleep.         Follow-up: Return in about 2 weeks (around 10/18/2020).  Claretta Fraise, M.D.

## 2020-10-05 ENCOUNTER — Emergency Department (HOSPITAL_COMMUNITY): Payer: 59

## 2020-10-05 ENCOUNTER — Telehealth: Payer: Self-pay | Admitting: Family Medicine

## 2020-10-05 ENCOUNTER — Inpatient Hospital Stay (HOSPITAL_COMMUNITY)
Admission: EM | Admit: 2020-10-05 | Discharge: 2020-10-09 | DRG: 683 | Disposition: A | Discharge status: Home / Self Care | Payer: 59 | Attending: Internal Medicine | Admitting: Internal Medicine

## 2020-10-05 ENCOUNTER — Other Ambulatory Visit: Payer: Self-pay

## 2020-10-05 DIAGNOSIS — M79671 Pain in right foot: Secondary | ICD-10-CM | POA: Diagnosis present

## 2020-10-05 DIAGNOSIS — Z9114 Patient's other noncompliance with medication regimen: Secondary | ICD-10-CM

## 2020-10-05 DIAGNOSIS — Z6831 Body mass index (BMI) 31.0-31.9, adult: Secondary | ICD-10-CM

## 2020-10-05 DIAGNOSIS — N138 Other obstructive and reflux uropathy: Secondary | ICD-10-CM | POA: Diagnosis present

## 2020-10-05 DIAGNOSIS — N400 Enlarged prostate without lower urinary tract symptoms: Secondary | ICD-10-CM

## 2020-10-05 DIAGNOSIS — N1339 Other hydronephrosis: Secondary | ICD-10-CM | POA: Diagnosis present

## 2020-10-05 DIAGNOSIS — N401 Enlarged prostate with lower urinary tract symptoms: Secondary | ICD-10-CM | POA: Diagnosis present

## 2020-10-05 DIAGNOSIS — Z8249 Family history of ischemic heart disease and other diseases of the circulatory system: Secondary | ICD-10-CM | POA: Diagnosis not present

## 2020-10-05 DIAGNOSIS — N179 Acute kidney failure, unspecified: Secondary | ICD-10-CM | POA: Diagnosis present

## 2020-10-05 DIAGNOSIS — D649 Anemia, unspecified: Secondary | ICD-10-CM | POA: Diagnosis present

## 2020-10-05 DIAGNOSIS — N19 Unspecified kidney failure: Secondary | ICD-10-CM | POA: Diagnosis not present

## 2020-10-05 DIAGNOSIS — M109 Gout, unspecified: Secondary | ICD-10-CM | POA: Diagnosis present

## 2020-10-05 DIAGNOSIS — R030 Elevated blood-pressure reading, without diagnosis of hypertension: Secondary | ICD-10-CM | POA: Diagnosis present

## 2020-10-05 DIAGNOSIS — Z20822 Contact with and (suspected) exposure to covid-19: Secondary | ICD-10-CM | POA: Diagnosis present

## 2020-10-05 DIAGNOSIS — D860 Sarcoidosis of lung: Secondary | ICD-10-CM | POA: Diagnosis present

## 2020-10-05 DIAGNOSIS — Z96642 Presence of left artificial hip joint: Secondary | ICD-10-CM | POA: Diagnosis present

## 2020-10-05 DIAGNOSIS — Z79899 Other long term (current) drug therapy: Secondary | ICD-10-CM | POA: Diagnosis not present

## 2020-10-05 DIAGNOSIS — R32 Unspecified urinary incontinence: Secondary | ICD-10-CM | POA: Diagnosis present

## 2020-10-05 DIAGNOSIS — N139 Obstructive and reflux uropathy, unspecified: Secondary | ICD-10-CM

## 2020-10-05 DIAGNOSIS — E872 Acidosis, unspecified: Secondary | ICD-10-CM | POA: Diagnosis present

## 2020-10-05 DIAGNOSIS — Z79890 Hormone replacement therapy (postmenopausal): Secondary | ICD-10-CM | POA: Diagnosis not present

## 2020-10-05 LAB — CBC WITH DIFFERENTIAL/PLATELET
Abs Immature Granulocytes: 0.04 10*3/uL (ref 0.00–0.07)
Basophils Absolute: 0.1 10*3/uL (ref 0.0–0.1)
Basophils Relative: 1 %
Eosinophils Absolute: 0.4 10*3/uL (ref 0.0–0.5)
Eosinophils Relative: 5 %
HCT: 29.9 % — ABNORMAL LOW (ref 39.0–52.0)
Hemoglobin: 9.9 g/dL — ABNORMAL LOW (ref 13.0–17.0)
Immature Granulocytes: 1 %
Lymphocytes Relative: 17 %
Lymphs Abs: 1.2 10*3/uL (ref 0.7–4.0)
MCH: 26.3 pg (ref 26.0–34.0)
MCHC: 33.1 g/dL (ref 30.0–36.0)
MCV: 79.3 fL — ABNORMAL LOW (ref 80.0–100.0)
Monocytes Absolute: 0.8 10*3/uL (ref 0.1–1.0)
Monocytes Relative: 12 %
Neutro Abs: 4.5 10*3/uL (ref 1.7–7.7)
Neutrophils Relative %: 64 %
Platelets: 270 10*3/uL (ref 150–400)
RBC: 3.77 MIL/uL — ABNORMAL LOW (ref 4.22–5.81)
RDW: 13.5 % (ref 11.5–15.5)
WBC: 6.9 10*3/uL (ref 4.0–10.5)
nRBC: 0 % (ref 0.0–0.2)

## 2020-10-05 LAB — URINALYSIS, ROUTINE W REFLEX MICROSCOPIC
Bacteria, UA: NONE SEEN
Bilirubin Urine: NEGATIVE
Glucose, UA: NEGATIVE mg/dL
Ketones, ur: NEGATIVE mg/dL
Leukocytes,Ua: NEGATIVE
Nitrite: NEGATIVE
Protein, ur: NEGATIVE mg/dL
Specific Gravity, Urine: 1.006 (ref 1.005–1.030)
pH: 5 (ref 5.0–8.0)

## 2020-10-05 LAB — CK: Total CK: 441 U/L — ABNORMAL HIGH (ref 49–397)

## 2020-10-05 LAB — COMPREHENSIVE METABOLIC PANEL
ALT: 10 U/L (ref 0–44)
AST: 21 U/L (ref 15–41)
Albumin: 3.9 g/dL (ref 3.5–5.0)
Alkaline Phosphatase: 100 U/L (ref 38–126)
Anion gap: 12 (ref 5–15)
BUN: 68 mg/dL — ABNORMAL HIGH (ref 6–20)
CO2: 18 mmol/L — ABNORMAL LOW (ref 22–32)
Calcium: 9 mg/dL (ref 8.9–10.3)
Chloride: 104 mmol/L (ref 98–111)
Creatinine, Ser: 8.79 mg/dL — ABNORMAL HIGH (ref 0.61–1.24)
GFR, Estimated: 7 mL/min — ABNORMAL LOW (ref >60)
Glucose, Bld: 106 mg/dL — ABNORMAL HIGH (ref 70–99)
Potassium: 4.7 mmol/L (ref 3.5–5.1)
Sodium: 134 mmol/L — ABNORMAL LOW (ref 135–145)
Total Bilirubin: 0.5 mg/dL (ref 0.3–1.2)
Total Protein: 7.6 g/dL (ref 6.5–8.1)

## 2020-10-05 LAB — PROTEIN / CREATININE RATIO, URINE
Creatinine, Urine: 99.14 mg/dL
Protein Creatinine Ratio: 0.11 mg/mg{Cre} (ref 0.00–0.15)
Total Protein, Urine: 11 mg/dL

## 2020-10-05 LAB — TROPONIN I (HIGH SENSITIVITY)
Troponin I (High Sensitivity): 25 ng/L — ABNORMAL HIGH (ref <18)
Troponin I (High Sensitivity): 24 ng/L — ABNORMAL HIGH (ref <18)

## 2020-10-05 LAB — RESP PANEL BY RT-PCR (FLU A&B, COVID) ARPGX2
Influenza A by PCR: NEGATIVE
Influenza B by PCR: NEGATIVE
SARS Coronavirus 2 by RT PCR: NEGATIVE

## 2020-10-05 LAB — BRAIN NATRIURETIC PEPTIDE: B Natriuretic Peptide: 72.3 pg/mL (ref 0.0–100.0)

## 2020-10-05 MED ORDER — SODIUM BICARBONATE 650 MG PO TABS
650.0000 mg | ORAL_TABLET | Freq: Two times a day (BID) | ORAL | Status: DC
  Administered 2020-10-05 – 2020-10-08 (×8): 650 mg via ORAL
  Filled 2020-10-05 (×8): qty 1

## 2020-10-05 MED ORDER — SODIUM CHLORIDE 0.9% FLUSH: 3.0000 mL | INTRAVENOUS | Status: DC | PRN

## 2020-10-05 MED ORDER — TAMSULOSIN HCL 0.4 MG PO CAPS
0.8000 mg | ORAL_CAPSULE | Freq: Every day | ORAL | Status: DC
  Administered 2020-10-05 – 2020-10-08 (×4): 0.8 mg via ORAL
  Filled 2020-10-05 (×4): qty 2

## 2020-10-05 MED ORDER — SODIUM CHLORIDE 0.9% FLUSH
3.0000 mL | Freq: Two times a day (BID) | INTRAVENOUS | Status: DC
  Administered 2020-10-05 – 2020-10-08 (×4): 3 mL via INTRAVENOUS

## 2020-10-05 MED ORDER — HEPARIN SODIUM (PORCINE) 5000 UNIT/ML IJ SOLN
5000.0000 [IU] | Freq: Three times a day (TID) | INTRAMUSCULAR | Status: DC
  Administered 2020-10-05 – 2020-10-09 (×12): 5000 [IU] via SUBCUTANEOUS
  Filled 2020-10-05 (×11): qty 1

## 2020-10-05 MED ORDER — SODIUM CHLORIDE 0.9 % IV SOLN: 250.0000 mL | INTRAVENOUS | Status: DC | PRN

## 2020-10-05 MED ORDER — LACTATED RINGERS IV SOLN: INTRAVENOUS | Status: DC

## 2020-10-05 NOTE — Telephone Encounter (Signed)
Stacks aware and has directed patient to ER

## 2020-10-05 NOTE — ED Provider Notes (Signed)
[facility] EMERGENCY DEPARTMENT Provider Note   CSN: [id number] Arrival date & time: 10/05/20  0849     History Chief Complaint  Patient presents with   Abnormal Lab    Luke Davila is a 55 y.o. male.   Abnormal Lab Patient presents for acute renal failure.  He went to his primary care doctor yesterday with complaints of urinary incontinence, dyspnea with exertion, increased thirst, and headache.  He reports a difficult time holding his urine for the past month.  He has not had any associated dysuria, groin pain, testicular pain.  He has been on tamsulosin before but has recently ran out.  He has hypertension and was started on blood pressure medication yesterday (amlodipine).  His creatinine was checked at that time and found to be 8.11.  2 months ago, his renal function was normal.  He was called by his primary care doctor this morning, on the way to work, and told to come to the hospital for concern of acute renal failure.  Patient denies any chronic use of NSAIDs.  Per chart review, does not take any nephrotoxic medications.  He denies any recent supplement use.    Past Medical History:  Diagnosis Date   Avascular necrosis of hip (HCC)    LEFT   Enlarged prostate    Glaucoma    Gout    History of gout     Patient Active Problem List   Diagnosis Date Noted   BPH (benign prostatic hyperplasia) 10/05/2020   Obstructive uropathy 10/05/2020   AKI (acute kidney injury) (HCC) 10/05/2020   Sarcoidosis of lung (HCC) 09/30/2019   Primary insomnia 02/14/2019   Bilateral primary osteoarthritis of knee 03/24/2018   PVC's (premature ventricular contractions) 02/03/2018   Hyperlipidemia 02/03/2018   Hyponatremia 08/06/2016   Constipation 10/29/2015   OA (osteoarthritis) of hip 04/09/2015   Hypogonadism in male 09/12/2014   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 09/29/2013   Visual field loss 09/29/2013   Erectile dysfunction 06/25/2012   Gout 06/25/2012     Past Surgical History:  Procedure Laterality Date   COLONOSCOPY WITH PROPOFOL N/A 11/27/2015   Procedure: COLONOSCOPY WITH PROPOFOL;  Surgeon: West Bali, MD;  Location: [facility]ITE;  Service: Endoscopy;  Laterality: N/A;  1100   FOOT SURGERY Left 1985   Left hand surgery     TOTAL HIP ARTHROPLASTY Left 04/09/2015   Procedure: LEFT TOTAL HIP ARTHROPLASTY ANTERIOR APPROACH;  Surgeon: Ollen Gross, MD;  Location: [facility] ORS;  Service: Orthopedics;  Laterality: Left;       Family History  Problem Relation Age of Onset   Hypertension Mother    Colon cancer Neg Hx     Social History   Tobacco Use   Smoking status: Never   Smokeless tobacco: Never  Substance Use Topics   Alcohol use: Yes    Comment: 3 beers daily   Drug use: No    Home Medications Prior to Admission medications   Medication Sig Start Date End Date Taking? Authorizing Provider  albuterol (VENTOLIN HFA) 108 (90 Base) MCG/ACT inhaler Inhale 2 puffs into the lungs every 6 (six) hours as needed for wheezing or shortness of breath. 09/29/19  Yes Stacks, Broadus John, MD  amLODipine (NORVASC) 5 MG tablet Take 1 tablet (5 mg total) by mouth daily. For blood pressure 10/04/20  Yes Stacks, Broadus John, MD  solifenacin (VESICARE) 10 MG tablet Take 1 tablet (10 mg total) by mouth daily. 10/04/20 10/04/21 Yes Stacks, Broadus John,  MD  testosterone cypionate (DEPOTESTOSTERONE CYPIONATE) 200 MG/ML injection INJECT 1 ML (200 MG TOTAL) INTO THE MUSCLE EVERY 14 (FOURTEEN) DAYS. 08/27/20  Yes Mechele Claude, MD  augmented betamethasone dipropionate (DIPROLENE-AF) 0.05 % cream Apply topically 2 (two) times daily. At affected areas (avoid face and genitals) Patient not taking: Reported on 10/05/2020 02/14/19   Mechele Claude, MD  diclofenac (VOLTAREN) 75 MG EC tablet Take 1 tablet (75 mg total) by mouth 2 (two) times daily. Patient not taking: Reported on 10/05/2020 07/31/20   Mechele Claude, MD  tamsulosin (FLOMAX) 0.4 MG CAPS capsule TAKE 2 CAPSULES (0.8  MG TOTAL) BY MOUTH DAILY AFTER SUPPER. Patient not taking: No sig reported 08/16/20   Mechele Claude, MD  zolpidem (AMBIEN) 5 MG tablet Take 1 tablet (5 mg total) by mouth at bedtime as needed for sleep. Patient not taking: No sig reported 03/12/20   Mechele Claude, MD    Allergies    Patient has no known allergies.  Review of Systems   Review of Systems  Constitutional:  Negative for activity change, appetite change, chills and fever.  HENT:  Negative for ear pain and sore throat.   Eyes:  Negative for pain and visual disturbance.  Respiratory:  Positive for shortness of breath (Exertional). Negative for cough, chest tightness and wheezing.   Cardiovascular:  Negative for chest pain and palpitations.  Gastrointestinal:  Negative for abdominal pain, diarrhea, nausea and vomiting.  Genitourinary:  Positive for difficulty urinating and urgency. Negative for dysuria, flank pain, hematuria, penile pain, scrotal swelling and testicular pain.  Musculoskeletal:  Negative for arthralgias, back pain, gait problem, joint swelling, myalgias and neck pain.  Skin:  Negative for color change and rash.  Neurological:  Positive for headaches. Negative for dizziness, seizures, syncope, speech difficulty, weakness, light-headedness and numbness.  Hematological:  Does not bruise/bleed easily.  Psychiatric/Behavioral:  Negative for confusion and decreased concentration.   All other systems reviewed and are negative.  Physical Exam Updated Vital Signs BP (!) 151/96 (BP Location: Left Arm)   Pulse (!) 101   Temp 98 F (36.7 C) (Oral)   Resp 18   Ht 6\' 3"  (1.905 m)   Wt 112.9 kg   SpO2 98%   BMI 31.11 kg/m   Physical Exam Vitals and nursing note reviewed.  Constitutional:      General: He is not in acute distress.    Appearance: Normal appearance. He is well-developed. He is not ill-appearing, toxic-appearing or diaphoretic.  HENT:     Head: Normocephalic and atraumatic.     Right Ear: External  ear normal.     Left Ear: External ear normal.     Nose: Nose normal.  Eyes:     General: No scleral icterus.    Extraocular Movements: Extraocular movements intact.     Conjunctiva/sclera: Conjunctivae normal.  Cardiovascular:     Rate and Rhythm: Normal rate and regular rhythm.     Heart sounds: No murmur heard. Pulmonary:     Effort: Pulmonary effort is normal. No respiratory distress.     Breath sounds: Normal breath sounds. No wheezing or rales.  Chest:     Chest wall: No tenderness.  Abdominal:     Palpations: Abdomen is soft.     Tenderness: There is no abdominal tenderness. There is no right CVA tenderness or left CVA tenderness.  Musculoskeletal:        General: No signs of injury. Normal range of motion.     Cervical back: Normal range  of motion and neck supple.  Skin:    General: Skin is warm and dry.     Coloration: Skin is not jaundiced or pale.  Neurological:     General: No focal deficit present.     Mental Status: He is alert and oriented to person, place, and time.     Cranial Nerves: No cranial nerve deficit.     Sensory: No sensory deficit.     Motor: No weakness.     Coordination: Coordination normal.  Psychiatric:        Mood and Affect: Mood normal.        Behavior: Behavior normal.        Thought Content: Thought content normal.        Judgment: Judgment normal.    ED Results / Procedures / Treatments   Labs (all labs ordered are listed, but only abnormal results are displayed) Labs Reviewed  COMPREHENSIVE METABOLIC PANEL - Abnormal; Notable for the following components:      Result Value   Sodium 134 (*)    CO2 18 (*)    Glucose, Bld 106 (*)    BUN 68 (*)    Creatinine, Ser 8.79 (*)    GFR, Estimated 7 (*)    All other components within normal limits  CBC WITH DIFFERENTIAL/PLATELET - Abnormal; Notable for the following components:   RBC 3.77 (*)    Hemoglobin 9.9 (*)    HCT 29.9 (*)    MCV 79.3 (*)    All other components within normal  limits  URINALYSIS, ROUTINE W REFLEX MICROSCOPIC - Abnormal; Notable for the following components:   Color, Urine STRAW (*)    Hgb urine dipstick SMALL (*)    All other components within normal limits  CK - Abnormal; Notable for the following components:   Total CK 441 (*)    All other components within normal limits  BASIC METABOLIC PANEL - Abnormal; Notable for the following components:   Sodium 134 (*)    CO2 19 (*)    Glucose, Bld 104 (*)    BUN 53 (*)    Creatinine, Ser 6.25 (*)    GFR, Estimated 10 (*)    All other components within normal limits  TROPONIN I (HIGH SENSITIVITY) - Abnormal; Notable for the following components:   Troponin I (High Sensitivity) 25 (*)    All other components within normal limits  TROPONIN I (HIGH SENSITIVITY) - Abnormal; Notable for the following components:   Troponin I (High Sensitivity) 24 (*)    All other components within normal limits  RESP PANEL BY RT-PCR (FLU A&B, COVID) ARPGX2  URINE CULTURE  BRAIN NATRIURETIC PEPTIDE  PROTEIN / CREATININE RATIO, URINE  PSA    EKG None  Radiology US RENAL  Result Date: 10/05/2020 CLINICAL DATA:  Renal failure EXAM: RENAL / URINARY TRACT ULTRASOUND COMPLETE COMPARISON:  None. FINDINGS: Right Kidney: Renal measurements: 10.8 x 4.7 x 5.2 cm = volume: 140 mL. Moderate hydronephrosis. Negative for mass or stone Left Kidney: Renal measurements: 10.9 x 5.1 x 3.9 cm = volume: 114 mL. Moderate hydronephrosis. Negative for mass or stone Bladder: Moderately distended bladder with trabeculation and uncomplicated diverticulum. IMPRESSION: 1. Distended bladder with trabeculation and diverticulum suggesting chronic outlet obstruction. 2. Moderate bilateral hydronephrosis that is likely from #1. Electronically Signed   By: Tiburcio Pea M.D.   On: 10/05/2020 10:57    Procedures Procedures   Medications Ordered in ED Medications  tamsulosin (FLOMAX) capsule 0.8 mg (0.8  mg Oral Given 10/05/20 2112)  heparin  injection 5,000 Units (5,000 Units Subcutaneous Given 10/06/20 1354)  sodium bicarbonate tablet 650 mg (650 mg Oral Given 10/06/20 0916)  sodium chloride flush (NS) 0.9 % injection 3 mL (3 mLs Intravenous Not Given 10/06/20 0916)  sodium chloride flush (NS) 0.9 % injection 3 mL (has no administration in time range)  0.9 %  sodium chloride infusion (has no administration in time range)  lactated ringers infusion ( Intravenous New Bag/Given 10/06/20 1357)  Chlorhexidine Gluconate Cloth 2 % PADS 6 each (6 each Topical Given 10/06/20 0916)    ED Course  I have reviewed the triage vital signs and the nursing notes.  Pertinent labs & imaging results that were available during my care of the patient were reviewed by me and considered in my medical decision making (see chart for details).    MDM Rules/Calculators/A&P                         CRITICAL CARE Performed by: Gloris Manchester   Total critical care time: 35 minutes  Critical care time was exclusive of separately billable procedures and treating other patients.  Critical care was necessary to treat or prevent imminent or life-threatening deterioration.  Critical care was time spent personally by me on the following activities: development of treatment plan with patient and/or surrogate as well as nursing, discussions with consultants, evaluation of patient's response to treatment, examination of patient, obtaining history from patient or surrogate, ordering and performing treatments and interventions, ordering and review of laboratory studies, ordering and review of radiographic studies, pulse oximetry and re-evaluation of patient's condition.   Patient presents for acute renal failure.  Previous lab work shows baseline creatinine 1.25.  This is consistent with muscular habitus.  In June, creatinine was 1.60.  Yesterday it was 8.11.  Patient has described recent urinary incontinence over the past month.  Prior to being bedded in the ED, lab work was  obtained which confirmed severe elevation in creatinine (0.79).  Renal ultrasound was also obtained which showed trabeculation that is consistent with bladder outlet obstruction.  Patient does have known BPH.  He was previously on tamsulosin.  On assessment, patient denies any current symptoms.  Blood pressure found to be moderately elevated.  He was started on amlodipine yesterday.  Etiology of renal injury likely obstructive.  Chronic hypertension may have contributed.  He has not been taking any medications or supplements that would have contributed.  Patient was able to urinate in the ED.  Postvoid residual volume was 542 cc.  We will check CK and urine protein creatinine ratio.  We will attempt Foley placement.  If difficulty, will consult urology.  Patient to be admitted.  Discussed case with nephrology.  Per their recommendation, patient's etiology of renal failure is to be obstructive until proven otherwise.  They agree with foley catheter placement and IV fluids with recheck of lab work in the morning.  I discussed with hospitalist. Hospitalist requested urology consult.  I did speak with urology who agreed with plan and agreed to come see the patient.  Patient was admitted for further management.    Final Clinical Impression(s) / ED Diagnoses Final diagnoses:  Renal failure    Rx / DC Orders ED Discharge Orders     None        Gloris Manchester, MD 10/06/20 1606

## 2020-10-05 NOTE — Consult Note (Signed)
Nephrology Consult   Requesting provider: Gloris [facility] Service requesting consult: ER Reason for consult: AKI   Assessment/Recommendations: Luke Davila is a/an 55 y.o. male with a past medical history BPH and sarcoidosis who present w/ acute secondary to obstruction  Severe nonoliguric AKI secondary to obstruction: AKI is due to obstruction based on imaging.  He was making large amounts of urine after Foley catheter placement. -Consult urology -Maintain Foley catheter -Agree with tamsulosin -Start lactated Ringer's 125 cc/h -Continue to monitor daily Cr, Dose meds for GFR -Monitor Daily I/Os, Daily weight  -Maintain MAP>65 for optimal renal perfusion.  -Avoid nephrotoxic medications including NSAIDs and Vanc/Zosyn combo -Currently no indication for HD  hypertension: Likely secondary to obstruction.  Hold medications  BPH: Management as above  Anemia: Likely multifactorial.  Continue to monitor  Metabolic acidosis: Mild secondary to AKI.  Likely will improve with time.  On sodium bicarb 650 mg twice daily  Recommendations conveyed to primary service.    Luke Davila [facility] 10/05/2020 3:02 PM   _____________________________________________________________________________________ CC: AKI  History of Present Illness: Luke Davila is a/an 55 y.o. male with a past medical history of sarcoidosis and BPH who presents with severe AKI.  Patient states for several weeks and possibly longer the patient has had difficulties emptying his bladder.  He had to start wearing diapers because he would wake up wet because of urinary incontinence.  He has had problems with BPH in the past and has not been taking his medications consistently.  He also has noticed worsening high blood pressure which is a new problem and was recently started on an ARB.  He denies fevers, chills, shortness of breath, chest pain, nausea, vomiting, diarrhea, confusion.  Has not noticed  hematuria or dysuria.  No significant abdominal pain and p.o. intake has been fairly good.  At his primary care doctor's visit he was found to have a creatinine of 8 and was recommended that he come to the emergency department.  In the emergency department his creatinine was again 8.8 with a bicarb of 18 and potassium of 4.7.  Hemoglobin was 9.9.  Renal ultrasound demonstrated signs of bilateral hydronephrosis as well as signs of chronic obstruction leading to bladder changes.  A Foley catheter was placed and a large amount of urine has been obtained since then.  Urinalysis is fairly bland.   Medications:  Current Facility-Administered Medications  Medication Dose Route Frequency Provider Last Rate Last Admin   0.9 %  sodium chloride infusion  250 mL Intravenous PRN Wouk, Wilfred Curtis, MD       heparin injection 5,000 Units  5,000 Units Subcutaneous Q8H Wouk, Wilfred Curtis, MD       sodium bicarbonate tablet 650 mg  650 mg Oral BID Wouk, Wilfred Curtis, MD       sodium chloride flush (NS) 0.9 % injection 3 mL  3 mL Intravenous Q12H Wouk, Wilfred Curtis, MD       sodium chloride flush (NS) 0.9 % injection 3 mL  3 mL Intravenous PRN Wouk, Wilfred Curtis, MD       tamsulosin [facility]) capsule 0.8 mg  0.8 mg Oral QPC supper Wouk, Wilfred Curtis, MD       testosterone cypionate (DEPOTESTOSTERONE CYPIONATE) injection 200 mg  200 mg Intramuscular Q14 Days Mechele Claude, MD   200 mg at 07/31/20 1713   Current Outpatient Medications  Medication Sig Dispense Refill   albuterol (VENTOLIN HFA) 108 (90 Base) MCG/ACT inhaler Inhale 2  puffs into the lungs every 6 (six) hours as needed for wheezing or shortness of breath. 1 each 11   amLODipine (NORVASC) 5 MG tablet Take 1 tablet (5 mg total) by mouth daily. For blood pressure 30 tablet 5   solifenacin (VESICARE) 10 MG tablet Take 1 tablet (10 mg total) by mouth daily. 90 tablet 3   testosterone cypionate (DEPOTESTOSTERONE CYPIONATE) 200 MG/ML injection INJECT 1 ML (200  MG TOTAL) INTO THE MUSCLE EVERY 14 (FOURTEEN) DAYS. 10 mL 0   augmented betamethasone dipropionate (DIPROLENE-AF) 0.05 % cream Apply topically 2 (two) times daily. At affected areas (avoid face and genitals) (Patient not taking: Reported on 10/05/2020) 50 g 1   diclofenac (VOLTAREN) 75 MG EC tablet Take 1 tablet (75 mg total) by mouth 2 (two) times daily. (Patient not taking: Reported on 10/05/2020) 60 tablet 5   tamsulosin (FLOMAX) 0.4 MG CAPS capsule TAKE 2 CAPSULES (0.8 MG TOTAL) BY MOUTH DAILY AFTER SUPPER. (Patient not taking: No sig reported) 180 capsule 0   zolpidem (AMBIEN) 5 MG tablet Take 1 tablet (5 mg total) by mouth at bedtime as needed for sleep. (Patient not taking: No sig reported) 30 tablet 2     ALLERGIES Patient has no known allergies.  MEDICAL HISTORY Past Medical History:  Diagnosis Date   Avascular necrosis of hip (HCC)    LEFT   Enlarged prostate    Glaucoma    Gout    History of gout      SOCIAL HISTORY Social History   Socioeconomic History   Marital status: Married    Spouse name: Ramona   Number of children: 2   Years of education: 12th   Highest education level: Not on file  Occupational History    Employer: [employer]    Comment: Media planner  Tobacco Use   Smoking status: Never   Smokeless tobacco: Never  Substance and Sexual Activity   Alcohol use: Yes    Comment: 3 beers daily   Drug use: No   Sexual activity: Yes    Birth control/protection: None  Other Topics Concern   Not on file  Social History Narrative   Patient lives at home with his family.   Caffeine Use: 1 soda daily   Patient is right handed   Social Determinants of Health   Financial Resource Strain: Not on file  Food Insecurity: Not on file  Transportation Needs: Not on file  Physical Activity: Not on file  Stress: Not on file  Social Connections: Not on file  Intimate Partner Violence: Not on file     FAMILY HISTORY Family History  Problem Relation Age of  Onset   Hypertension Mother    Colon cancer Neg Hx       Review of Systems: 12 systems reviewed Otherwise as per HPI, all other systems reviewed and negative  Physical Exam: Vitals:   10/05/20 1245 10/05/20 1313  BP: (!) 161/97 (!) 150/100  Pulse: 90 83  Resp: 16 20  Temp:    SpO2: 100% 100%   Total I/O In: -  Out: 542 [Urine:542]  Intake/Output Summary (Last 24 hours) at 10/05/2020 1502 Last data filed at 10/05/2020 1148 Gross per 24 hour  Intake --  Output 542 ml  Net -542 ml   General: well-appearing, no acute distress HEENT: anicteric sclera, oropharynx clear without lesions CV: regular rate, normal rhythm, no murmurs, no gallops, no rubs, no peripheral edema Lungs: clear to auscultation bilaterally, normal work of breathing Abd: soft,  non-tender, non-distended Skin: no visible lesions or rashes Psych: alert, engaged, appropriate mood and affect Musculoskeletal: no obvious deformities Neuro: normal speech, no gross focal deficits   Test Results Reviewed Lab Results  Component Value Date   NA 134 (L) 10/05/2020   K 4.7 10/05/2020   CL 104 10/05/2020   CO2 18 (L) 10/05/2020   BUN 68 (H) 10/05/2020   CREATININE 8.79 (H) 10/05/2020   CALCIUM 9.0 10/05/2020   ALBUMIN 3.9 10/05/2020     I have reviewed all relevant outside healthcare records related to the patient's current hospitalization

## 2020-10-05 NOTE — H&P (Signed)
History and Physical    Luke Davila KDT:[id number] DOB: April 03, 1965 DOA: 10/05/2020  PCP: Mechele Claude, MD  Patient coming from: home   Chief Complaint: elevated creatinine  HPI: Luke Davila is a 55 y.o. male with medical history significant for sarcoidosis and bph who presents with the above.  Stopped taking flomax about 3 months ago. Reports about 2 months of slowly worsening urinary urgency, incomplete voiding, and enuresis. Went to PCP 2 days ago where diagnosed with htn and labs obtained. Referred to the ED when creatinine returned as 8 from baseline of normal. Patient denies abdominal pain, denies fevers. No chest pain or cough. No history urinary retention.  ED Course:   Repeat labs show creatinine in the 8s. Renal u/s shows b/l hydronephrosis with distended bladder. Foley placed.  Review of Systems: As per HPI otherwise 10 point review of systems negative.    Past Medical History:  Diagnosis Date   Avascular necrosis of hip (HCC)    LEFT   Enlarged prostate    Glaucoma    Gout    History of gout     Past Surgical History:  Procedure Laterality Date   COLONOSCOPY WITH PROPOFOL N/A 11/27/2015   Procedure: COLONOSCOPY WITH PROPOFOL;  Surgeon: West Bali, MD;  Location: [facility] SUITE;  Service: Endoscopy;  Laterality: N/A;  1100   FOOT SURGERY Left 1985   Left hand surgery     TOTAL HIP ARTHROPLASTY Left 04/09/2015   Procedure: LEFT TOTAL HIP ARTHROPLASTY ANTERIOR APPROACH;  Surgeon: Ollen Gross, MD;  Location: [facility] ORS;  Service: Orthopedics;  Laterality: Left;     reports that he has never smoked. He has never used smokeless tobacco. He reports current alcohol use. He reports that he does not use drugs.  No Known Allergies  Family History  Problem Relation Age of Onset   Hypertension Mother    Colon cancer Neg Hx     Prior to Admission medications   Medication Sig Start Date End Date Taking? Authorizing Provider  albuterol (VENTOLIN HFA) 108 (90  Base) MCG/ACT inhaler Inhale 2 puffs into the lungs every 6 (six) hours as needed for wheezing or shortness of breath. 09/29/19  Yes Stacks, Broadus John, MD  amLODipine (NORVASC) 5 MG tablet Take 1 tablet (5 mg total) by mouth daily. For blood pressure 10/04/20  Yes Stacks, Broadus John, MD  solifenacin (VESICARE) 10 MG tablet Take 1 tablet (10 mg total) by mouth daily. 10/04/20 10/04/21 Yes Stacks, Broadus John, MD  testosterone cypionate (DEPOTESTOSTERONE CYPIONATE) 200 MG/ML injection INJECT 1 ML (200 MG TOTAL) INTO THE MUSCLE EVERY 14 (FOURTEEN) DAYS. 08/27/20  Yes Mechele Claude, MD  augmented betamethasone dipropionate (DIPROLENE-AF) 0.05 % cream Apply topically 2 (two) times daily. At affected areas (avoid face and genitals) Patient not taking: Reported on 10/05/2020 02/14/19   Mechele Claude, MD  diclofenac (VOLTAREN) 75 MG EC tablet Take 1 tablet (75 mg total) by mouth 2 (two) times daily. Patient not taking: Reported on 10/05/2020 07/31/20   Mechele Claude, MD  tamsulosin (FLOMAX) 0.4 MG CAPS capsule TAKE 2 CAPSULES (0.8 MG TOTAL) BY MOUTH DAILY AFTER SUPPER. Patient not taking: No sig reported 08/16/20   Mechele Claude, MD  zolpidem (AMBIEN) 5 MG tablet Take 1 tablet (5 mg total) by mouth at bedtime as needed for sleep. Patient not taking: No sig reported 03/12/20   Mechele Claude, MD    Physical Exam: Vitals:   10/05/20 1115 10/05/20 1130 10/05/20 1245 10/05/20 1313  BP: (!) 165/102 Marland Kitchen)  162/104 (!) 161/97 (!) 150/100  Pulse: 88 85 90 83  Resp: 18  16 20   Temp:      TempSrc:      SpO2: 100% 100% 100% 100%    Constitutional: No acute distress Head: Atraumatic Eyes: Conjunctiva clear ENM: Moist mucous membranes. Normal dentition.  Neck: Supple Respiratory: Clear to auscultation bilaterally, no wheezing/rales/rhonchi. Normal respiratory effort. No accessory muscle use. . Cardiovascular: Regular rate and rhythm. No murmurs/rubs/gallops. Abdomen: Non-tender, non-distended. No masses. No rebound or guarding.  Positive bowel sounds. Musculoskeletal: No joint deformity upper and lower extremities. Normal ROM, no contractures. Normal muscle tone.  Skin: No rashes, lesions, or ulcers.  Extremities: trace LE edema Neurologic: Alert, moving all 4 extremities. Psychiatric: Normal insight and judgement.   Labs on Admission: I have personally reviewed following labs and imaging studies  CBC: Recent Labs  Lab 10/04/20 0842 10/05/20 0910  WBC 6.4 6.9  NEUTROABS 4.0 4.5  HGB 10.9* 9.9*  HCT 32.4* 29.9*  MCV 77* 79.3*  PLT 296 270   Basic Metabolic Panel: Recent Labs  Lab 10/04/20 0842 10/05/20 0910  NA 135 134*  K 4.9 4.7  CL 101 104  CO2 16* 18*  GLUCOSE 99 106*  BUN 64* 68*  CREATININE 8.11* 8.79*  CALCIUM 9.2 9.0   GFR: Estimated Creatinine Clearance: 13.1 mL/min (A) (by C-G formula based on SCr of 8.79 mg/dL (H)). Liver Function Tests: Recent Labs  Lab 10/04/20 0842 10/05/20 0910  AST 23 21  ALT 9 10  ALKPHOS 129* 100  BILITOT 0.5 0.5  PROT 7.7 7.6  ALBUMIN 4.5 3.9   No results for input(s): LIPASE, AMYLASE in the last 168 hours. No results for input(s): AMMONIA in the last 168 hours. Coagulation Profile: No results for input(s): INR, PROTIME in the last 168 hours. Cardiac Enzymes: Recent Labs  Lab 10/05/20 1148  CKTOTAL 441*   BNP (last 3 results) No results for input(s): PROBNP in the last 8760 hours. HbA1C: No results for input(s): HGBA1C in the last 72 hours. CBG: No results for input(s): GLUCAP in the last 168 hours. Lipid Profile: No results for input(s): CHOL, HDL, LDLCALC, TRIG, CHOLHDL, LDLDIRECT in the last 72 hours. Thyroid Function Tests: No results for input(s): TSH, T4TOTAL, FREET4, T3FREE, THYROIDAB in the last 72 hours. Anemia Panel: No results for input(s): VITAMINB12, FOLATE, FERRITIN, TIBC, IRON, RETICCTPCT in the last 72 hours. Urine analysis:    Component Value Date/Time   COLORURINE STRAW (A) 10/05/2020 1123   APPEARANCEUR CLEAR  10/05/2020 1123   APPEARANCEUR Clear 10/04/2020 0855   LABSPEC 1.006 10/05/2020 1123   PHURINE 5.0 10/05/2020 1123   GLUCOSEU NEGATIVE 10/05/2020 1123   HGBUR SMALL (A) 10/05/2020 1123   BILIRUBINUR NEGATIVE 10/05/2020 1123   BILIRUBINUR Negative 10/04/2020 0855   KETONESUR NEGATIVE 10/05/2020 1123   PROTEINUR NEGATIVE 10/05/2020 1123   UROBILINOGEN negative 12/15/2012 1806   NITRITE NEGATIVE 10/05/2020 1123   LEUKOCYTESUR NEGATIVE 10/05/2020 1123    Radiological Exams on Admission: 10/07/2020 RENAL  Result Date: 10/05/2020 CLINICAL DATA:  Renal failure EXAM: RENAL / URINARY TRACT ULTRASOUND COMPLETE COMPARISON:  None. FINDINGS: Right Kidney: Renal measurements: 10.8 x 4.7 x 5.2 cm = volume: 140 mL. Moderate hydronephrosis. Negative for mass or stone Left Kidney: Renal measurements: 10.9 x 5.1 x 3.9 cm = volume: 114 mL. Moderate hydronephrosis. Negative for mass or stone Bladder: Moderately distended bladder with trabeculation and uncomplicated diverticulum. IMPRESSION: 1. Distended bladder with trabeculation and diverticulum suggesting chronic outlet obstruction. 2.  Moderate bilateral hydronephrosis that is likely from #1. Electronically Signed   By: Tiburcio Pea M.D.   On: 10/05/2020 10:57    EKG: Independently reviewed. nsr  Assessment/Plan Principal Problem:   AKI (acute kidney injury) (HCC) Active Problems:   Sarcoidosis of lung (HCC)   BPH (benign prostatic hyperplasia)   Obstructive uropathy   # Acute kidney injury # Obstructive uropathy # BPH # Metabolic acidosis Pt has history of BPH, self-discontinued flomax several months ago, now has 2 months worsening obstructive symptoms and found to have acute renal failure and u/s findings consistent with obstructive uropathy. Likely has some component of ATN - urology and nephrology consulted, recs pending - maintain Foley - resume flomax - no uti symptoms; will f/u culture - f/u psa - strict I/os, monitor for postobstructive  diuresis, would start fluids as needed, until then liberal PO - monitor kidney function - co2 is 18, will order bicarb supplement - tele given risk for hyperkalemia  # Elevated BP No hx htn, here bp elevated 2/2 fluid overload - mgmt as above  # Sarcoidosis Thus far involvement appears to be left eye whish is stable. Diagnosed with pulmonary sarcoid as well but this appears to be quiescent. Not on any disease-modifying meds - outpt f/u    DVT prophylaxis: heparin Code Status: full  Family Communication: wife updated @ bedside 9/30  Consults called: nephrology, urology   Level of care: Telemetry Medical, inpt   Silvano Bilis MD [facility] Pager [phone]  If 7PM-7AM, please contact night-coverage [email or web address] Password [email or web address]  10/05/2020, 1:47 PM

## 2020-10-05 NOTE — ED Triage Notes (Signed)
Pr here POV d/t abnormal lab noted with PCP. Creatine resulted >8. Pt endorses increased urination. Denies pain. SOB X3 months. More on exertion.

## 2020-10-05 NOTE — ED Provider Notes (Signed)
Emergency Medicine Provider Triage Evaluation Note  BRICK KETCHER , a 55 y.o. male  was evaluated in triage.  Pt complains of abnormal lab work - pt went to PCP yesterday with complaints of SOB x 1 month mostly with exertion as well as increased thirst and headache - he was called today and notified to come to the ED. Creatinine 8.11 with labs 3 months ago normal. PT feels fine today besides some SOB while walking from the parking lot. No chest pain. He has had some leg swelling as well mostly to his ankles. Was started on BP Meds yesterday per patient.  Review of Systems  Positive: + headaches, polyuria, SOB Negative: - chest pain  Physical Exam  BP (!) 160/96 (BP Location: Right Arm)   Pulse (!) 101   Temp 98.3 F (36.8 C) (Oral)   Resp 18   SpO2 100%  Gen:   Awake, no distress   Resp:  Normal effort. Speaking in full sentences MSK:   Moves extremities without difficulty  Other:  RRR  Medical Decision Making  Medically screening exam initiated at 9:08 AM.  Appropriate orders placed.  CHON [facility]HL was informed that the remainder of the evaluation will be completed by another provider, this initial triage assessment does not replace that evaluation, and the importance of remaining in the ED until their evaluation is complete.     Tanda Rockers, PA-C 10/05/20 0712    Arby Barrette, MD 10/05/20 1452

## 2020-10-05 NOTE — Telephone Encounter (Signed)
Huntley Dec called from [employer] [employer] to give Critical Lab Result.  Creatine level 8.11

## 2020-10-06 ENCOUNTER — Encounter (HOSPITAL_COMMUNITY): Payer: Self-pay | Admitting: Obstetrics and Gynecology

## 2020-10-06 DIAGNOSIS — N179 Acute kidney failure, unspecified: Principal | ICD-10-CM

## 2020-10-06 LAB — CMP14+EGFR
ALT: 9 IU/L (ref 0–44)
AST: 23 IU/L (ref 0–40)
Albumin/Globulin Ratio: 1.4 (ref 1.2–2.2)
Albumin: 4.5 g/dL (ref 3.8–4.9)
Alkaline Phosphatase: 129 IU/L — ABNORMAL HIGH (ref 44–121)
BUN/Creatinine Ratio: 8 — ABNORMAL LOW (ref 9–20)
BUN: 64 mg/dL — ABNORMAL HIGH (ref 6–24)
Bilirubin Total: 0.5 mg/dL (ref 0.0–1.2)
CO2: 16 mmol/L — ABNORMAL LOW (ref 20–29)
Calcium: 9.2 mg/dL (ref 8.7–10.2)
Chloride: 101 mmol/L (ref 96–106)
Creatinine, Ser: 8.11 mg/dL (ref 0.76–1.27)
Globulin, Total: 3.2 g/dL (ref 1.5–4.5)
Glucose: 99 mg/dL (ref 70–99)
Potassium: 4.9 mmol/L (ref 3.5–5.2)
Sodium: 135 mmol/L (ref 134–144)
Total Protein: 7.7 g/dL (ref 6.0–8.5)
eGFR: 7 mL/min/{1.73_m2} — ABNORMAL LOW (ref >59)

## 2020-10-06 LAB — CBC WITH DIFFERENTIAL/PLATELET
Basophils Absolute: 0.1 10*3/uL (ref 0.0–0.2)
Basos: 1 %
EOS (ABSOLUTE): 0.3 10*3/uL (ref 0.0–0.4)
Eos: 5 %
Hematocrit: 32.4 % — ABNORMAL LOW (ref 37.5–51.0)
Hemoglobin: 10.9 g/dL — ABNORMAL LOW (ref 13.0–17.7)
Immature Grans (Abs): 0 10*3/uL (ref 0.0–0.1)
Immature Granulocytes: 1 %
Lymphocytes Absolute: 1.2 10*3/uL (ref 0.7–3.1)
Lymphs: 19 %
MCH: 26 pg — ABNORMAL LOW (ref 26.6–33.0)
MCHC: 33.6 g/dL (ref 31.5–35.7)
MCV: 77 fL — ABNORMAL LOW (ref 79–97)
Monocytes Absolute: 0.8 10*3/uL (ref 0.1–0.9)
Monocytes: 12 %
Neutrophils Absolute: 4 10*3/uL (ref 1.4–7.0)
Neutrophils: 62 %
Platelets: 296 10*3/uL (ref 150–450)
RBC: 4.19 x10E6/uL (ref 4.14–5.80)
RDW: 13 % (ref 11.6–15.4)
WBC: 6.4 10*3/uL (ref 3.4–10.8)

## 2020-10-06 LAB — D-DIMER, QUANTITATIVE: D-DIMER: 1.16 mg/L FEU — ABNORMAL HIGH (ref 0.00–0.49)

## 2020-10-06 LAB — TESTOSTERONE,FREE AND TOTAL
Testosterone, Free: 3.5 pg/mL — ABNORMAL LOW (ref 7.2–24.0)
Testosterone: 140 ng/dL — ABNORMAL LOW (ref 264–916)

## 2020-10-06 LAB — BASIC METABOLIC PANEL
Anion gap: 10 (ref 5–15)
BUN: 53 mg/dL — ABNORMAL HIGH (ref 6–20)
CO2: 19 mmol/L — ABNORMAL LOW (ref 22–32)
Calcium: 8.9 mg/dL (ref 8.9–10.3)
Chloride: 105 mmol/L (ref 98–111)
Creatinine, Ser: 6.25 mg/dL — ABNORMAL HIGH (ref 0.61–1.24)
GFR, Estimated: 10 mL/min — ABNORMAL LOW (ref >60)
Glucose, Bld: 104 mg/dL — ABNORMAL HIGH (ref 70–99)
Potassium: 3.7 mmol/L (ref 3.5–5.1)
Sodium: 134 mmol/L — ABNORMAL LOW (ref 135–145)

## 2020-10-06 LAB — PSA: Prostatic Specific Antigen: 0.62 ng/mL (ref 0.00–4.00)

## 2020-10-06 MED ORDER — CHLORHEXIDINE GLUCONATE CLOTH 2 % EX PADS
6.0000 | MEDICATED_PAD | Freq: Every day | CUTANEOUS | Status: DC
  Administered 2020-10-06 – 2020-10-08 (×3): 6 via TOPICAL

## 2020-10-06 NOTE — Progress Notes (Addendum)
PROGRESS NOTE  Luke Davila GDJ:[id number] DOB: [birth year] DOA: 10/05/2020 PCP: Mechele Claude, MD  HPI/Recap of past 24 hours: This is a 55 year old male with past medical history significant for BPH sarcoidosis presenting with acute elevated creatinine. He was also experiencing worsening of slowing of his urination with incomplete voiding He was found to have bilateral hydronephrosis with distended bladder and Foley catheter was placed while he was in the emergency department  Subjective: October 06, 2020: Patient seen and examined at bedside he was sitting on the side of bed stated that he needed to move around because lying down in the bed was causing him to have back pain    Assessment/Plan: Principal Problem:   AKI (acute kidney injury) (HCC) Active Problems:   Sarcoidosis of lung (HCC)   BPH (benign prostatic hyperplasia)   Obstructive uropathy   1.  Acute kidney injury with no previous kidney disease most likely due to BPH with hydronephrosis Patient was on Flomax but he discontinued the Flonase several months prior Continue Foley catheter Continue Flomax    His creatinine is still elevated ; 8.1. today Continue IV hydration  2.  BPH Obstructive uropathy Metabolic acidosis restart Flomax Maintain Foley catheter  3.  Elevated blood pressure with no history of hypertension Home continue low-salt diet we will continue to monitor and possibly start him on medication tomorrow his blood pressure is uncontrolled still  4.  Sarcoidosis Stable Continue outpatient follow-up  Code Status: Full  Severity of Illness: The appropriate patient status for this patient is INPATIENT. Inpatient status is judged to be reasonable and necessary in order to provide the required intensity of service to ensure the patient's safety. The patient's presenting symptoms, physical exam findings, and initial radiographic and laboratory data in the context of their chronic comorbidities is  felt to place them at high risk for further clinical deterioration. Furthermore, it is not anticipated that the patient will be medically stable for discharge from the hospital within 2 midnights of admission. The following factors support the patient status of inpatient.  AKI  * I certify that at the point of admission it is my clinical judgment that the patient will require inpatient hospital care spanning beyond 2 midnights from the point of admission due to high intensity of service, high risk for further deterioration and high frequency of surveillance required.*   Family Communication: Gust with patient  Disposition Plan: Home Status is: Inpatient   Dispo: The patient is from: Home              Anticipated d/c is to:               Anticipated d/c date is:               Patient currently not medically stable for discharge  Consultants: Nephrology  Procedures: None  Antimicrobials: None  DVT prophylaxis: Sepsis acute heparin   Objective: Vitals:   10/06/20 0100 10/06/20 0122 10/06/20 0541 10/06/20 0910  BP: (!) 147/103 (!) 161/98 (!) 146/99 (!) 151/96  Pulse: 90 92 (!) 104 (!) 101  Resp: (!) 26 19 18 18   Temp:  97.9 F (36.6 C) 98.4 F (36.9 C) 98 F (36.7 C)  TempSrc:  Oral  Oral  SpO2: 98% 99% 98% 98%  Weight:  112.9 kg    Height:  6\' 3"  (1.905 m)      Intake/Output Summary (Last 24 hours) at 10/06/2020 1135 Last data filed at 10/06/2020 0900 Gross per 24 hour  Intake 2493.59 ml  Output 3792 ml  Net -1298.41 ml   Filed Weights   10/06/20 0122  Weight: 112.9 kg   Body mass index is 31.11 kg/m.  Exam:  General: 55 y.o. year-old male well developed well nourished in no acute distress.  Alert and oriented x3. Cardiovascular: Regular rate and rhythm with no rubs or gallops.  No thyromegaly or JVD noted.   Respiratory: Clear to auscultation with no wheezes or rales. Good inspiratory effort. Abdomen: Soft nontender nondistended with normal bowel sounds x4  quadrants. Musculoskeletal: No lower extremity edema. 2/4 pulses in all 4 extremities. Skin: No ulcerative lesions noted or rashes, Psychiatry: Mood is appropriate for condition and setting Neurology:    Data Reviewed: CBC: Recent Labs  Lab 10/04/20 0842 10/05/20 0910  WBC 6.4 6.9  NEUTROABS 4.0 4.5  HGB 10.9* 9.9*  HCT 32.4* 29.9*  MCV 77* 79.3*  PLT 296 270   Basic Metabolic Panel: Recent Labs  Lab 10/04/20 0842 10/05/20 0910 10/06/20 0333  NA 135 134* 134*  K 4.9 4.7 3.7  CL 101 104 105  CO2 16* 18* 19*  GLUCOSE 99 106* 104*  BUN 64* 68* 53*  CREATININE 8.11* 8.79* 6.25*  CALCIUM 9.2 9.0 8.9   GFR: Estimated Creatinine Clearance: 18.1 mL/min (A) (by C-G formula based on SCr of 6.25 mg/dL (H)). Liver Function Tests: Recent Labs  Lab 10/04/20 0842 10/05/20 0910  AST 23 21  ALT 9 10  ALKPHOS 129* 100  BILITOT 0.5 0.5  PROT 7.7 7.6  ALBUMIN 4.5 3.9   No results for input(s): LIPASE, AMYLASE in the last 168 hours. No results for input(s): AMMONIA in the last 168 hours. Coagulation Profile: No results for input(s): INR, PROTIME in the last 168 hours. Cardiac Enzymes: Recent Labs  Lab 10/05/20 1148  CKTOTAL 441*   BNP (last 3 results) No results for input(s): PROBNP in the last 8760 hours. HbA1C: No results for input(s): HGBA1C in the last 72 hours. CBG: No results for input(s): GLUCAP in the last 168 hours. Lipid Profile: No results for input(s): CHOL, HDL, LDLCALC, TRIG, CHOLHDL, LDLDIRECT in the last 72 hours. Thyroid Function Tests: No results for input(s): TSH, T4TOTAL, FREET4, T3FREE, THYROIDAB in the last 72 hours. Anemia Panel: No results for input(s): VITAMINB12, FOLATE, FERRITIN, TIBC, IRON, RETICCTPCT in the last 72 hours. Urine analysis:    Component Value Date/Time   COLORURINE STRAW (A) 10/05/2020 1123   APPEARANCEUR CLEAR 10/05/2020 1123   APPEARANCEUR Clear 10/04/2020 0855   LABSPEC 1.006 10/05/2020 1123   PHURINE 5.0  10/05/2020 1123   GLUCOSEU NEGATIVE 10/05/2020 1123   HGBUR SMALL (A) 10/05/2020 1123   BILIRUBINUR NEGATIVE 10/05/2020 1123   BILIRUBINUR Negative 10/04/2020 0855   KETONESUR NEGATIVE 10/05/2020 1123   PROTEINUR NEGATIVE 10/05/2020 1123   UROBILINOGEN negative 12/15/2012 1806   NITRITE NEGATIVE 10/05/2020 1123   LEUKOCYTESUR NEGATIVE 10/05/2020 1123   Sepsis Labs: @LABRCNTIP (procalcitonin:4,lacticidven:4)  ) Recent Results (from the past 240 hour(s))  Resp Panel by RT-PCR (Flu A&B, Covid) Nasopharyngeal Swab     Status: None   Collection Time: 10/05/20 12:56 PM   Specimen: Nasopharyngeal Swab; Nasopharyngeal(NP) swabs in vial transport medium  Result Value Ref Range Status   SARS Coronavirus 2 by RT PCR NEGATIVE NEGATIVE Final    Comment: (NOTE) SARS-CoV-2 target nucleic acids are NOT DETECTED.  The SARS-CoV-2 RNA is generally detectable in upper respiratory specimens during the acute phase of infection. The lowest concentration of SARS-CoV-2 viral copies this  assay can detect is 138 copies/mL. A negative result does not preclude SARS-Cov-2 infection and should not be used as the sole basis for treatment or other patient management decisions. A negative result may occur with  improper specimen collection/handling, submission of specimen other than nasopharyngeal swab, presence of viral mutation(s) within the areas targeted by this assay, and inadequate number of viral copies(<138 copies/mL). A negative result must be combined with clinical observations, patient history, and epidemiological information. The expected result is Negative.  Fact Sheet for Patients:  BloggerCourse.com  Fact Sheet for Healthcare Providers:  SeriousBroker.it  This test is no t yet approved or cleared by the [place] FDA and  has been authorized for detection and/or diagnosis of SARS-CoV-2 by FDA under an Emergency Use Authorization (EUA).  This EUA will remain  in effect (meaning this test can be used) for the duration of the COVID-19 declaration under Section 564(b)(1) of the Act, 21 U.S.C.section 360bbb-3(b)(1), unless the authorization is terminated  or revoked sooner.       Influenza A by PCR NEGATIVE NEGATIVE Final   Influenza B by PCR NEGATIVE NEGATIVE Final    Comment: (NOTE) The Xpert Xpress SARS-CoV-2/FLU/RSV plus assay is intended as an aid in the diagnosis of influenza from Nasopharyngeal swab specimens and should not be used as a sole basis for treatment. Nasal washings and aspirates are unacceptable for Xpert Xpress SARS-CoV-2/FLU/RSV testing.  Fact Sheet for Patients: BloggerCourse.com  Fact Sheet for Healthcare Providers: SeriousBroker.it  This test is not yet approved or cleared by the [place] FDA and has been authorized for detection and/or diagnosis of SARS-CoV-2 by FDA under an Emergency Use Authorization (EUA). This EUA will remain in effect (meaning this test can be used) for the duration of the COVID-19 declaration under Section 564(b)(1) of the Act, 21 U.S.C. section 360bbb-3(b)(1), unless the authorization is terminated or revoked.  Performed at [facility], [address] [place], [place] [address]       Studies: No results found.  Scheduled Meds:  Chlorhexidine Gluconate Cloth  6 each Topical Daily   heparin  5,000 Units Subcutaneous Q8H   sodium bicarbonate  650 mg Oral BID   sodium chloride flush  3 mL Intravenous Q12H   tamsulosin  0.8 mg Oral QPC supper    Continuous Infusions:  sodium chloride     lactated ringers 125 mL/hr at 10/06/20 0544     LOS: 1 day     Myrtie Neither, MD [facility]  To reach me or the doctor on call, go to: [email or web address] Password [email or web address]  10/06/2020, 11:35 AM

## 2020-10-06 NOTE — Progress Notes (Signed)
Nephrology Follow-Up Consult note   Assessment/Recommendations: Luke Davila is a/an 55 y.o. male with a past medical history significant for BPH and sarcoidosis who present w/ acute secondary to obstruction   Severe nonoliguric AKI secondary to obstruction: AKI is due to obstruction based on imaging.  Patient has continued to make good urine and creatinine has significantly improved down to 6 today -Recommend urology involvement -Maintain Foley catheter -Agree with tamsulosin -We will continue IV fluids for another 24 hours.  Likely can discharge tomorrow if creatinine continues to improve from nephrology perspective -Continue to monitor daily Cr, Dose meds for GFR -Monitor Daily I/Os, Daily weight  -Maintain MAP>65 for optimal renal perfusion.  -Avoid nephrotoxic medications including NSAIDs and Vanc/Zosyn combo -Currently no indication for HD  Given the patient's significant improvement we will sign off at this time.  His kidney function will likely continue to improve now that obstruction is relieved.  Urology should be involved.  If further assistance is needed please let us know   hypertension: Likely secondary to obstruction.  Hold medications   BPH: Management as above   Anemia: Likely multifactorial.  Continue to monitor   Metabolic acidosis: Mild secondary to AKI.  Likely will improve with time.  On sodium bicarb 650 mg twice daily.  Would not continue at discharge   Recommendations conveyed to primary service.    HYMIE GORR [facility] 10/06/2020 10:52 AM  ___________________________________________________________  CC: AKI  Interval History/Subjective: Patient states he feels great today.  Urine output has been excellent.  No complaints.   Medications:  Current Facility-Administered Medications  Medication Dose Route Frequency Provider Last Rate Last Admin   0.9 %  sodium chloride infusion  250 mL Intravenous PRN Wouk, Wilfred Curtis, MD        Chlorhexidine Gluconate Cloth 2 % PADS 6 each  6 each Topical Daily Wouk, Wilfred Curtis, MD   6 each at 10/06/20 0916   heparin injection 5,000 Units  5,000 Units Subcutaneous Q8H Kathrynn Running, MD   5,000 Units at 10/06/20 0703   lactated ringers infusion   Intravenous Continuous Darnell Level, MD 125 mL/hr at 10/06/20 0544 New Bag at 10/06/20 0544   sodium bicarbonate tablet 650 mg  650 mg Oral BID Kathrynn Running, MD   650 mg at 10/06/20 0916   sodium chloride flush (NS) 0.9 % injection 3 mL  3 mL Intravenous Q12H Wouk, Wilfred Curtis, MD   3 mL at 10/06/20 0040   sodium chloride flush (NS) 0.9 % injection 3 mL  3 mL Intravenous PRN Wouk, Wilfred Curtis, MD       tamsulosin [facility]) capsule 0.8 mg  0.8 mg Oral QPC supper Kathrynn Running, MD   0.8 mg at 10/05/20 2112      Review of Systems: 10 systems reviewed and negative except per interval history/subjective  Physical Exam: Vitals:   10/06/20 0541 10/06/20 0910  BP: (!) 146/99 (!) 151/96  Pulse: (!) 104 (!) 101  Resp: 18 18  Temp: 98.4 F (36.9 C) 98 F (36.7 C)  SpO2: 98% 98%   Total I/O In: 1317.1 [P.O.:360; I.V.:957.1] Out: 250 [Urine:250]  Intake/Output Summary (Last 24 hours) at 10/06/2020 1052 Last data filed at 10/06/2020 0900 Gross per 24 hour  Intake 2493.59 ml  Output 3792 ml  Net -1298.41 ml   Constitutional: well-appearing, no acute distress ENMT: ears and nose without scars or lesions, MMM CV: normal rate, no edema Respiratory: clear to auscultation,  normal work of breathing Gastrointestinal: soft, non-tender, no palpable masses or hernias Skin: no visible lesions or rashes Psych: alert, judgement/insight appropriate, appropriate mood and affect   Test Results I personally reviewed new and old clinical labs and radiology tests Lab Results  Component Value Date   NA 134 (L) 10/06/2020   K 3.7 10/06/2020   CL 105 10/06/2020   CO2 19 (L) 10/06/2020   BUN 53 (H) 10/06/2020   CREATININE  6.25 (H) 10/06/2020   CALCIUM 8.9 10/06/2020   ALBUMIN 3.9 10/05/2020

## 2020-10-07 LAB — BASIC METABOLIC PANEL
Anion gap: 8 (ref 5–15)
BUN: 37 mg/dL — ABNORMAL HIGH (ref 6–20)
CO2: 24 mmol/L (ref 22–32)
Calcium: 8.7 mg/dL — ABNORMAL LOW (ref 8.9–10.3)
Chloride: 108 mmol/L (ref 98–111)
Creatinine, Ser: 4.18 mg/dL — ABNORMAL HIGH (ref 0.61–1.24)
GFR, Estimated: 16 mL/min — ABNORMAL LOW (ref >60)
Glucose, Bld: 94 mg/dL (ref 70–99)
Potassium: 3.6 mmol/L (ref 3.5–5.1)
Sodium: 140 mmol/L (ref 135–145)

## 2020-10-07 LAB — URINE CULTURE: Organism ID, Bacteria: NO GROWTH

## 2020-10-07 NOTE — Plan of Care (Signed)
  Problem: Activity: Goal: Risk for activity intolerance will decrease Outcome: Progressing   

## 2020-10-07 NOTE — Plan of Care (Signed)
  Problem: Activity: Goal: Risk for activity intolerance will decrease 10/07/2020 0335 by Rosie Fate, RN Outcome: Progressing 10/07/2020 0333 by Rosie Fate, RN Outcome: Progressing   Problem: Pain Managment: Goal: General experience of comfort will improve Outcome: Progressing

## 2020-10-07 NOTE — Plan of Care (Signed)
  Problem: Clinical Measurements: Goal: Diagnostic test results will improve Outcome: Progressing   

## 2020-10-07 NOTE — Progress Notes (Signed)
PROGRESS NOTE  Luke Davila IEP:[id number] DOB: April 26, 1965 DOA: 10/05/2020 PCP: Mechele Claude, MD  HPI/Recap of past 24 hours: This is a 55 year old male with past medical history significant for BPH sarcoidosis presenting with acute elevated creatinine. He was also experiencing worsening of slowing of his urination with incomplete voiding He was found to have bilateral hydronephrosis with distended bladder and Foley catheter was placed while he was in the emergency department  Subjective: October 06, 2020: Patient seen and examined at bedside he was sitting on the side of bed stated that he needed to move around because lying down in the bed was causing him to have back pain  October 07, 2020: Patient seen and examined at bedside He denies any new complaint he is doing well he is sitting on the side of the bed    Assessment/Plan: Principal Problem:   AKI (acute kidney injury) (HCC) Active Problems:   Sarcoidosis of lung (HCC)   BPH (benign prostatic hyperplasia)   Obstructive uropathy   1.  Acute kidney injury with no previous kidney disease most likely due to BPH with hydronephrosis Patient was on Flomax but he discontinued the Flonase several months prior Continue Foley catheter Continue Flomax    His creatinine is improved down to 4.18 today still elevated ; it was 8.1.  Yesterday Continue IV hydration  2.  BPH Obstructive uropathy Metabolic acidosis restart Flomax Maintain Foley catheter  3.  Elevated blood pressure with no history of hypertension Home continue low-salt diet we will continue to monitor and possibly start him on medication tomorrow his blood pressure is uncontrolled still  4.  Sarcoidosis Stable Continue outpatient follow-up  Code Status: Full  Severity of Illness: The appropriate patient status for this patient is INPATIENT. Inpatient status is judged to be reasonable and necessary in order to provide the required intensity of service to  ensure the patient's safety. The patient's presenting symptoms, physical exam findings, and initial radiographic and laboratory data in the context of their chronic comorbidities is felt to place them at high risk for further clinical deterioration. Furthermore, it is not anticipated that the patient will be medically stable for discharge from the hospital within 2 midnights of admission. The following factors support the patient status of inpatient.  AKI  * I certify that at the point of admission it is my clinical judgment that the patient will require inpatient hospital care spanning beyond 2 midnights from the point of admission due to high intensity of service, high risk for further deterioration and high frequency of surveillance required.*   Family Communication: Gust with patient  Disposition Plan: Home Status is: Inpatient   Dispo: The patient is from: Home              Anticipated d/c is to:               Anticipated d/c date is:               Patient currently not medically stable for discharge  Consultants: Nephrology  Procedures: None  Antimicrobials: None  DVT prophylaxis: Sepsis acute heparin   Objective: Vitals:   10/06/20 1714 10/06/20 1954 10/07/20 0515 10/07/20 0934  BP: (!) 169/68 (!) 165/93 (!) 155/87 (!) 149/80  Pulse: 82 90 80 86  Resp:  17 16 18   Temp: 98.5 F (36.9 C) 98.4 F (36.9 C) 98.3 F (36.8 C) 98 F (36.7 C)  TempSrc: Oral Oral Oral Oral  SpO2: 99% 100% 98% 98%  Weight:      Height:        Intake/Output Summary (Last 24 hours) at 10/07/2020 0957 Last data filed at 10/07/2020 0900 Gross per 24 hour  Intake 4016.24 ml  Output 4950 ml  Net -933.76 ml    Filed Weights   10/06/20 0122  Weight: 112.9 kg   Body mass index is 31.11 kg/m.  Exam:  General: 55 y.o. year-old male well developed well nourished in no acute distress.  Alert and oriented x3. Cardiovascular: Regular rate and rhythm with no rubs or gallops.  No thyromegaly  or JVD noted.   Respiratory: Clear to auscultation with no wheezes or rales. Good inspiratory effort. Abdomen: Soft nontender nondistended with normal bowel sounds x4 quadrants. Musculoskeletal: No lower extremity edema. 2/4 pulses in all 4 extremities. Skin: No ulcerative lesions noted or rashes, Psychiatry: Mood is appropriate for condition and setting Neurology:    Data Reviewed: CBC: Recent Labs  Lab 10/04/20 0842 10/05/20 0910  WBC 6.4 6.9  NEUTROABS 4.0 4.5  HGB 10.9* 9.9*  HCT 32.4* 29.9*  MCV 77* 79.3*  PLT 296 270    Basic Metabolic Panel: Recent Labs  Lab 10/04/20 0842 10/05/20 0910 10/06/20 0333  NA 135 134* 134*  K 4.9 4.7 3.7  CL 101 104 105  CO2 16* 18* 19*  GLUCOSE 99 106* 104*  BUN 64* 68* 53*  CREATININE 8.11* 8.79* 6.25*  CALCIUM 9.2 9.0 8.9    GFR: Estimated Creatinine Clearance: 18.1 mL/min (A) (by C-G formula based on SCr of 6.25 mg/dL (H)). Liver Function Tests: Recent Labs  Lab 10/04/20 0842 10/05/20 0910  AST 23 21  ALT 9 10  ALKPHOS 129* 100  BILITOT 0.5 0.5  PROT 7.7 7.6  ALBUMIN 4.5 3.9    No results for input(s): LIPASE, AMYLASE in the last 168 hours. No results for input(s): AMMONIA in the last 168 hours. Coagulation Profile: No results for input(s): INR, PROTIME in the last 168 hours. Cardiac Enzymes: Recent Labs  Lab 10/05/20 1148  CKTOTAL 441*    BNP (last 3 results) No results for input(s): PROBNP in the last 8760 hours. HbA1C: No results for input(s): HGBA1C in the last 72 hours. CBG: No results for input(s): GLUCAP in the last 168 hours. Lipid Profile: No results for input(s): CHOL, HDL, LDLCALC, TRIG, CHOLHDL, LDLDIRECT in the last 72 hours. Thyroid Function Tests: No results for input(s): TSH, T4TOTAL, FREET4, T3FREE, THYROIDAB in the last 72 hours. Anemia Panel: No results for input(s): VITAMINB12, FOLATE, FERRITIN, TIBC, IRON, RETICCTPCT in the last 72 hours. Urine analysis:    Component Value  Date/Time   COLORURINE STRAW (A) 10/05/2020 1123   APPEARANCEUR CLEAR 10/05/2020 1123   APPEARANCEUR Clear 10/04/2020 0855   LABSPEC 1.006 10/05/2020 1123   PHURINE 5.0 10/05/2020 1123   GLUCOSEU NEGATIVE 10/05/2020 1123   HGBUR SMALL (A) 10/05/2020 1123   BILIRUBINUR NEGATIVE 10/05/2020 1123   BILIRUBINUR Negative 10/04/2020 0855   KETONESUR NEGATIVE 10/05/2020 1123   PROTEINUR NEGATIVE 10/05/2020 1123   UROBILINOGEN negative 12/15/2012 1806   NITRITE NEGATIVE 10/05/2020 1123   LEUKOCYTESUR NEGATIVE 10/05/2020 1123   Sepsis Labs: @LABRCNTIP (procalcitonin:4,lacticidven:4)  ) Recent Results (from the past 240 hour(s))  Resp Panel by RT-PCR (Flu A&B, Covid) Nasopharyngeal Swab     Status: None   Collection Time: 10/05/20 12:56 PM   Specimen: Nasopharyngeal Swab; Nasopharyngeal(NP) swabs in vial transport medium  Result Value Ref Range Status   SARS Coronavirus 2 by RT PCR NEGATIVE NEGATIVE  Final    Comment: (NOTE) SARS-CoV-2 target nucleic acids are NOT DETECTED.  The SARS-CoV-2 RNA is generally detectable in upper respiratory specimens during the acute phase of infection. The lowest concentration of SARS-CoV-2 viral copies this assay can detect is 138 copies/mL. A negative result does not preclude SARS-Cov-2 infection and should not be used as the sole basis for treatment or other patient management decisions. A negative result may occur with  improper specimen collection/handling, submission of specimen other than nasopharyngeal swab, presence of viral mutation(s) within the areas targeted by this assay, and inadequate number of viral copies(<138 copies/mL). A negative result must be combined with clinical observations, patient history, and epidemiological information. The expected result is Negative.  Fact Sheet for Patients:  BloggerCourse.com  Fact Sheet for Healthcare Providers:  SeriousBroker.it  This test is no t  yet approved or cleared by the [place] FDA and  has been authorized for detection and/or diagnosis of SARS-CoV-2 by FDA under an Emergency Use Authorization (EUA). This EUA will remain  in effect (meaning this test can be used) for the duration of the COVID-19 declaration under Section 564(b)(1) of the Act, 21 U.S.C.section 360bbb-3(b)(1), unless the authorization is terminated  or revoked sooner.       Influenza A by PCR NEGATIVE NEGATIVE Final   Influenza B by PCR NEGATIVE NEGATIVE Final    Comment: (NOTE) The Xpert Xpress SARS-CoV-2/FLU/RSV plus assay is intended as an aid in the diagnosis of influenza from Nasopharyngeal swab specimens and should not be used as a sole basis for treatment. Nasal washings and aspirates are unacceptable for Xpert Xpress SARS-CoV-2/FLU/RSV testing.  Fact Sheet for Patients: BloggerCourse.com  Fact Sheet for Healthcare Providers: SeriousBroker.it  This test is not yet approved or cleared by the [place] FDA and has been authorized for detection and/or diagnosis of SARS-CoV-2 by FDA under an Emergency Use Authorization (EUA). This EUA will remain in effect (meaning this test can be used) for the duration of the COVID-19 declaration under Section 564(b)(1) of the Act, 21 U.S.C. section 360bbb-3(b)(1), unless the authorization is terminated or revoked.  Performed at [facility], [address], [place], [place] [address]       Studies: No results found.  Scheduled Meds:  Chlorhexidine Gluconate Cloth  6 each Topical Daily   heparin  5,000 Units Subcutaneous Q8H   sodium bicarbonate  650 mg Oral BID   sodium chloride flush  3 mL Intravenous Q12H   tamsulosin  0.8 mg Oral QPC supper    Continuous Infusions:  sodium chloride     lactated ringers 125 mL/hr at 10/07/20 0631     LOS: 2 days     Myrtie Neither, MD [facility]  To reach me or the doctor on  call, go to: [email or web address] Password [facility]  10/07/2020, 9:57 AM

## 2020-10-08 ENCOUNTER — Inpatient Hospital Stay (HOSPITAL_COMMUNITY): Payer: 59

## 2020-10-08 LAB — BASIC METABOLIC PANEL
Anion gap: 8 (ref 5–15)
BUN: 31 mg/dL — ABNORMAL HIGH (ref 6–20)
CO2: 24 mmol/L (ref 22–32)
Calcium: 8.5 mg/dL — ABNORMAL LOW (ref 8.9–10.3)
Chloride: 105 mmol/L (ref 98–111)
Creatinine, Ser: 3.66 mg/dL — ABNORMAL HIGH (ref 0.61–1.24)
GFR, Estimated: 19 mL/min — ABNORMAL LOW (ref >60)
Glucose, Bld: 88 mg/dL (ref 70–99)
Potassium: 3.7 mmol/L (ref 3.5–5.1)
Sodium: 137 mmol/L (ref 135–145)

## 2020-10-08 LAB — URIC ACID: Uric Acid, Serum: 9.1 mg/dL — ABNORMAL HIGH (ref 3.7–8.6)

## 2020-10-08 MED ORDER — PREDNISONE 20 MG PO TABS
20.0000 mg | ORAL_TABLET | Freq: Every day | ORAL | Status: DC
  Administered 2020-10-08 – 2020-10-09 (×2): 20 mg via ORAL
  Filled 2020-10-08 (×2): qty 1

## 2020-10-08 MED ORDER — OXYCODONE-ACETAMINOPHEN 5-325 MG PO TABS
1.0000 | ORAL_TABLET | ORAL | Status: DC | PRN
  Administered 2020-10-08 – 2020-10-09 (×4): 1 via ORAL
  Filled 2020-10-08 (×4): qty 1

## 2020-10-08 MED ORDER — AMLODIPINE BESYLATE 5 MG PO TABS
5.0000 mg | ORAL_TABLET | Freq: Every day | ORAL | Status: DC
  Administered 2020-10-08 – 2020-10-09 (×2): 5 mg via ORAL
  Filled 2020-10-08 (×2): qty 1

## 2020-10-08 NOTE — Progress Notes (Addendum)
PROGRESS NOTE    Luke Davila  CEY:[id number] DOB: May 01, 1965 DOA: 10/05/2020 PCP: Mechele Claude, MD   Chief Complaint  Patient presents with   Abnormal Lab    Brief Narrative/Hospital Course: 55 year old male with history of sarcoidosis, BPH presented to the ED with abnormal labs/renal failure.  Saw his PCP 2 days PTA found to have high blood pressure labs were drawn and was referred to the ED for admission, had creatinine in 8 baseline normal. In the ED renal ultrasound showed distended bladder with trabeculation and diverticulum suggesting chronic outlet obstruction, moderate bilateral hydronephrosis. Foley catheter was placed.  Patient was admitted for AKI obstructive uropathy. Patient was seen by nephrology, managed with IV fluids, Foley catheter maintenance.  Subjective: Seen this morning alert awake oriented no complaints.  Foley catheter draining well.  Has right foot pain-feels similar to his previous gout attack many years ago.  Assessment & Plan:  Severe nonoliguric AKI secondary to obstruction/obstructive nephropathy: BPH ultrasound showing chronic bladder outlet obstruction: Creatinine improving with Foley in place, continue the same, continue Flomax.  He will need urology consultation likely and will need to go home with Foley catheter.  We will consult with Dr. Liliane Shi today-I discussed with him he advised to keep the Foley catheter and have outpatient follow-up and he will arrange the follow-up, no further recommendation as inpatient so he will see as outpatient.  Metabolic acidosis due to AKI resolved.  Discontinue sodium bicarb.    Essential hypertension: Blood pressure felt to be high due to #1, but remains elevated 140s to 160s if he still running high 160s we will start amlodipine 5 mg today  Sarcoidosis of lung: Outpatient follow-up  Right foot pain suspect acute gout flareup unable to use colchicine, NSAIDs, add prednisone x5 days.  Check uric acid  level.  Obesity Class class I:patient's Body mass index is 31.11 kg/m. : Will benefit with PCP follow-up, weight loss healthy lifestyle and outpatient sleep evaluation  Diet Order             Diet regular Room service appropriate? Yes; Fluid consistency: Thin  Diet effective now                  DVT prophylaxis: heparin injection 5,000 Units Start: 10/05/20 1400 Code Status:   Code Status: Full Code  Family Communication: plan of care discussed with patient at bedside. Status is: Inpatient Remains inpatient appropriate because:IV treatments appropriate due to intensity of illness or inability to take PO and Inpatient level of care appropriate due to severity of illness Dispo: The patient is from: Home              Anticipated d/c is to: Home              Patient currently is not medically stable to d/c.   Difficult to place patient No Objective: Vitals: Today's Vitals   10/07/20 0934 10/07/20 2012 10/08/20 0404 10/08/20 0405  BP: (!) 149/80 (!) 170/92 (!) 160/104 (!) 160/102  Pulse: 86 78 93 81  Resp: 18 18 18 18   Temp: 98 F (36.7 C) 98.3 F (36.8 C)  100.2 F (37.9 C)  TempSrc: Oral Oral  Oral  SpO2: 98% 100% 99% 97%  Weight:      Height:      PainSc:  0-No pain  0-No pain   Physical Examination: General exam: AA0x3, weak,older than stated age. HEENT:Oral mucosa moist, Ear/Nose WNL grossly,dentition normal. Respiratory system: B/l  clearBS, no use of  accessory muscle, non tender. Cardiovascular system: S1 & S2 +,No JVD. Gastrointestinal system: Abdomen soft, NT,ND, BS+. Nervous System:Alert, awake, moving extremities. Extremities: Leg edema none, distal peripheral pulses palpable.  Skin: No rashes, no icterus. MSK: Normal muscle bulk,tone, power.  Right foot is tender to palpation Foley catheter in place draining well  Medications reviewed:  Scheduled Meds:  Chlorhexidine Gluconate Cloth  6 each Topical Daily   heparin  5,000 Units Subcutaneous Q8H   sodium  bicarbonate  650 mg Oral BID   sodium chloride flush  3 mL Intravenous Q12H   tamsulosin  0.8 mg Oral QPC supper   Continuous Infusions:  sodium chloride     lactated ringers 125 mL/hr at 10/08/20 0635    Intake/Output  Intake/Output Summary (Last 24 hours) at 10/08/2020 1130 Last data filed at 10/08/2020 1100 Gross per 24 hour  Intake 3039.25 ml  Output 6000 ml  Net -2960.75 ml   Intake/Output from previous day: 10/02 0701 - 10/03 0700 In: 3399.3 [P.O.:1920; I.V.:1479.3] Out: 4800 [Urine:4800] Net IO Since Admission: -5,192.92 mL [10/08/20 1130]   Weight change:   Wt Readings from Last 3 Encounters:  10/06/20 112.9 kg  10/04/20 117.1 kg  07/31/20 119.3 kg     Consultants:see note  Procedures:see note Antimicrobials: Anti-infectives (From admission, onward)    None      Culture/Microbiology No results found for: SDES, SPECREQUEST, CULT, REPTSTATUS  Other culture-see note  Unresulted Labs (From admission, onward)    None     Data Reviewed: I have personally reviewed following labs and imaging studies CBC: Recent Labs  Lab 10/04/20 0842 10/05/20 0910  WBC 6.4 6.9  NEUTROABS 4.0 4.5  HGB 10.9* 9.9*  HCT 32.4* 29.9*  MCV 77* 79.3*  PLT 296 270   Basic Metabolic Panel: Recent Labs  Lab 10/04/20 0842 10/05/20 0910 10/06/20 0333 10/07/20 1000 10/08/20 0159  NA 135 134* 134* 140 137  K 4.9 4.7 3.7 3.6 3.7  CL 101 104 105 108 105  CO2 16* 18* 19* 24 24  GLUCOSE 99 106* 104* 94 88  BUN 64* 68* 53* 37* 31*  CREATININE 8.11* 8.79* 6.25* 4.18* 3.66*  CALCIUM 9.2 9.0 8.9 8.7* 8.5*   GFR: Estimated Creatinine Clearance: 30.9 mL/min (A) (by C-G formula based on SCr of 3.66 mg/dL (H)). Liver Function Tests: Recent Labs  Lab 10/04/20 0842 10/05/20 0910  AST 23 21  ALT 9 10  ALKPHOS 129* 100  BILITOT 0.5 0.5  PROT 7.7 7.6  ALBUMIN 4.5 3.9   No results for input(s): LIPASE, AMYLASE in the last 168 hours. No results for input(s): AMMONIA in the  last 168 hours. Coagulation Profile: No results for input(s): INR, PROTIME in the last 168 hours. Cardiac Enzymes: Recent Labs  Lab 10/05/20 1148  CKTOTAL 441*   BNP (last 3 results) No results for input(s): PROBNP in the last 8760 hours. HbA1C: No results for input(s): HGBA1C in the last 72 hours. CBG: No results for input(s): GLUCAP in the last 168 hours. Lipid Profile: No results for input(s): CHOL, HDL, LDLCALC, TRIG, CHOLHDL, LDLDIRECT in the last 72 hours. Thyroid Function Tests: No results for input(s): TSH, T4TOTAL, FREET4, T3FREE, THYROIDAB in the last 72 hours. Anemia Panel: No results for input(s): VITAMINB12, FOLATE, FERRITIN, TIBC, IRON, RETICCTPCT in the last 72 hours. Sepsis Labs: No results for input(s): PROCALCITON, LATICACIDVEN in the last 168 hours.  Recent Results (from the past 240 hour(s))  Urine Culture     Status: None  Collection Time: 10/04/20  8:55 AM   Specimen: Urine   UR  Result Value Ref Range Status   Urine Culture, Routine Final report  Final   Organism ID, Bacteria No growth  Final  Resp Panel by RT-PCR (Flu A&B, Covid) Nasopharyngeal Swab     Status: None   Collection Time: 10/05/20 12:56 PM   Specimen: Nasopharyngeal Swab; Nasopharyngeal(NP) swabs in vial transport medium  Result Value Ref Range Status   SARS Coronavirus 2 by RT PCR NEGATIVE NEGATIVE Final    Comment: (NOTE) SARS-CoV-2 target nucleic acids are NOT DETECTED.  The SARS-CoV-2 RNA is generally detectable in upper respiratory specimens during the acute phase of infection. The lowest concentration of SARS-CoV-2 viral copies this assay can detect is 138 copies/mL. A negative result does not preclude SARS-Cov-2 infection and should not be used as the sole basis for treatment or other patient management decisions. A negative result may occur with  improper specimen collection/handling, submission of specimen other than nasopharyngeal swab, presence of viral mutation(s) within  the areas targeted by this assay, and inadequate number of viral copies(<138 copies/mL). A negative result must be combined with clinical observations, patient history, and epidemiological information. The expected result is Negative.  Fact Sheet for Patients:  BloggerCourse.com  Fact Sheet for Healthcare Providers:  SeriousBroker.it  This test is no t yet approved or cleared by the [place] FDA and  has been authorized for detection and/or diagnosis of SARS-CoV-2 by FDA under an Emergency Use Authorization (EUA). This EUA will remain  in effect (meaning this test can be used) for the duration of the COVID-19 declaration under Section 564(b)(1) of the Act, 21 U.S.C.section 360bbb-3(b)(1), unless the authorization is terminated  or revoked sooner.       Influenza A by PCR NEGATIVE NEGATIVE Final   Influenza B by PCR NEGATIVE NEGATIVE Final    Comment: (NOTE) The Xpert Xpress SARS-CoV-2/FLU/RSV plus assay is intended as an aid in the diagnosis of influenza from Nasopharyngeal swab specimens and should not be used as a sole basis for treatment. Nasal washings and aspirates are unacceptable for Xpert Xpress SARS-CoV-2/FLU/RSV testing.  Fact Sheet for Patients: BloggerCourse.com  Fact Sheet for Healthcare Providers: SeriousBroker.it  This test is not yet approved or cleared by the [place] FDA and has been authorized for detection and/or diagnosis of SARS-CoV-2 by FDA under an Emergency Use Authorization (EUA). This EUA will remain in effect (meaning this test can be used) for the duration of the COVID-19 declaration under Section 564(b)(1) of the Act, 21 U.S.C. section 360bbb-3(b)(1), unless the authorization is terminated or revoked.  Performed at [facility], [address] [place], [place] [address]     Radiology Studies: No results found.   LOS: 3  days   Lanae Boast, MD [facility]  10/08/2020, 11:30 AM

## 2020-10-08 NOTE — Plan of Care (Signed)
  Problem: Clinical Measurements: Goal: Respiratory complications will improve Outcome: Progressing   Problem: Nutrition: Goal: Adequate nutrition will be maintained Outcome: Progressing   

## 2020-10-09 LAB — BASIC METABOLIC PANEL
Anion gap: 9 (ref 5–15)
BUN: 29 mg/dL — ABNORMAL HIGH (ref 6–20)
CO2: 26 mmol/L (ref 22–32)
Calcium: 8.6 mg/dL — ABNORMAL LOW (ref 8.9–10.3)
Chloride: 100 mmol/L (ref 98–111)
Creatinine, Ser: 3.05 mg/dL — ABNORMAL HIGH (ref 0.61–1.24)
GFR, Estimated: 23 mL/min — ABNORMAL LOW (ref >60)
Glucose, Bld: 102 mg/dL — ABNORMAL HIGH (ref 70–99)
Potassium: 4 mmol/L (ref 3.5–5.1)
Sodium: 135 mmol/L (ref 135–145)

## 2020-10-09 MED ORDER — PREDNISONE 20 MG PO TABS: 20.0000 mg | ORAL_TABLET | Freq: Every day | ORAL | 0 refills | Status: AC

## 2020-10-09 MED ORDER — TRAMADOL HCL 50 MG PO TABS: 50.0000 mg | ORAL_TABLET | Freq: Four times a day (QID) | ORAL | 0 refills | Status: AC | PRN

## 2020-10-09 NOTE — Discharge Summary (Signed)
Physician Discharge Summary  Luke Davila ION:[id number] DOB: 03-28-1965 DOA: 10/05/2020  PCP: Mechele Claude, MD  Admit date: 10/05/2020 Discharge date: 10/09/2020  Admitted From: home Disposition:  home  Recommendations for Outpatient Follow-up:  Follow up with PCP in 1-2 weeks Follow up with urology w/I 1 wk Please obtain BMP/CBC in one week Please follow up on the following pending results:  Home Health:no  Equipment/Devices: none  Discharge Condition: Stable Code Status:   Code Status: Full Code Diet recommendation:  Diet Order             Diet regular Room service appropriate? Yes; Fluid consistency: Thin  Diet effective now                    Brief/Interim Summary:  55 year old male with history of sarcoidosis, BPH presented to the ED with abnormal labs/renal failure.  Saw his PCP 2 days PTA found to have high blood pressure labs were drawn and was referred to the ED for admission, had creatinine in 8 baseline normal. In the ED renal ultrasound showed distended bladder with trabeculation and diverticulum suggesting chronic outlet obstruction, moderate bilateral hydronephrosis. Foley catheter was placed.  Patient was admitted for AKI obstructive uropathy. Patient was seen by nephrology, managed with IV fluids, Foley catheter maintenance Creatinine has nicely trended cleared by nephrology.  Discussed with urology Dr. Sande Brothers advised to keep the Foley catheter in place and he will plan for voiding trial as outpatient, no further inpatient recommendation.  Continue Flomax. Blood pressure was uncontrolled started amlodipine 5 mg. He had right foot pain suspect an acute gout flareup, started on a steroid x-ray no acute finding uric acid elevated 9.1.  He will follow-up with PCP as outpatient.  Discharge Diagnoses:  Severe nonoliguric AKI secondary to obstruction/obstructive nephropathy: BPH ultrasound showing chronic bladder outlet obstruction: Creatinine improving with  Foley in place, continue the same, continue Flomax, will need to go home with Foley catheter.  Discussed with urology Dr. Sande Brothers advised to keep the Foley catheter in place and he will plan for voiding trial as outpatient, no further inpatient recommendation. Follow-up with PCP for BMP in 1 wk, outpatient cleared by nephrology   Metabolic acidosis due to AKI resolved.  Discontinued sodium bicarb.     Essential hypertension: Blood pressure improving after starting low-dose amlodipine.     Sarcoidosis of lung: Outpatient follow-up   Right foot pain suspect acute gout flareup continue prednisone, x-ray unremarkable, continue pain control    Obesity Class class I:patient's Body mass index is 31.11 kg/m. : Will benefit with PCP follow-up, weight loss healthy lifestyle and outpatient sleep evaluation  Consults: Nephrology, urology on the phone  Subjective: Aaox3, pain better on foot. Fees ready for home  Discharge Exam: Vitals:   10/09/20 0539 10/09/20 0843  BP: (!) 150/91 (!) 154/95  Pulse: 67 78  Resp: 16 20  Temp: (!) 97.4 F (36.3 C) 97.9 F (36.6 C)  SpO2: 99% 100%   General: Pt is alert, awake, not in acute distress Cardiovascular: RRR, S1/S2 +, no rubs, no gallops Respiratory: CTA bilaterally, no wheezing, no rhonchi Abdominal: Soft, NT, ND, bowel sounds + Extremities: no edema, no cyanosis  Discharge Instructions  Discharge Instructions     Ambulatory referral to Urology   Complete by: As directed    Dr winter w/I 1 wk   Discharge instructions   Complete by: As directed    Follow-up with urology to remove Foley catheter Follow-up with PCP in 1  week to check BMP. Please call call MD or return to ER for similar or worsening recurring problem that brought you to hospital or if any fever,nausea/vomiting,abdominal pain, uncontrolled pain, chest pain,  shortness of breath or any other alarming symptoms.  Please follow-up your doctor as instructed in a week time to check  bmp and call the office for appointment.  Please avoid alcohol, smoking, or any other illicit substance and maintain healthy habits including taking your regular medications as prescribed.  You were cared for by a hospitalist during your hospital stay. If you have any questions about your discharge medications or the care you received while you were in the hospital after you are discharged, you can call the unit and ask to speak with the hospitalist on call if the hospitalist that took care of you is not available.  Once you are discharged, your primary care physician will handle any further medical issues. Please note that NO REFILLS for any discharge medications will be authorized once you are discharged, as it is imperative that you return to your primary care physician (or establish a relationship with a primary care physician if you do not have one) for your aftercare needs so that they can reassess your need for medications and monitor your lab values   Increase activity slowly   Complete by: As directed       Allergies as of 10/09/2020   No Known Allergies      Medication List     STOP taking these medications    diclofenac 75 MG EC tablet Commonly known as: VOLTAREN       TAKE these medications    albuterol 108 (90 Base) MCG/ACT inhaler Commonly known as: VENTOLIN HFA Inhale 2 puffs into the lungs every 6 (six) hours as needed for wheezing or shortness of breath.   amLODipine 5 MG tablet Commonly known as: NORVASC Take 1 tablet (5 mg total) by mouth daily. For blood pressure   augmented betamethasone dipropionate 0.05 % cream Commonly known as: DIPROLENE-AF Apply topically 2 (two) times daily. At affected areas (avoid face and genitals)   predniSONE 20 MG tablet Commonly known as: DELTASONE Take 1 tablet (20 mg total) by mouth daily with breakfast for 5 days.   solifenacin 10 MG tablet Commonly known as: VESICARE Take 1 tablet (10 mg total) by mouth daily.    tamsulosin 0.4 MG Caps capsule Commonly known as: FLOMAX TAKE 2 CAPSULES (0.8 MG TOTAL) BY MOUTH DAILY AFTER SUPPER.   testosterone cypionate 200 MG/ML injection Commonly known as: DEPOTESTOSTERONE CYPIONATE INJECT 1 ML (200 MG TOTAL) INTO THE MUSCLE EVERY 14 (FOURTEEN) DAYS.   traMADol 50 MG tablet Commonly known as: Ultram Take 1 tablet (50 mg total) by mouth every 6 (six) hours as needed for up to 15 doses for severe pain or moderate pain.   zolpidem 5 MG tablet Commonly known as: AMBIEN Take 1 tablet (5 mg total) by mouth at bedtime as needed for sleep.        Follow-up Information     Mechele Claude, MD Follow up in 1 week(s).   Specialty: Family Medicine Contact information: [address] [place] [address] [phone]         Rene Paci, MD. Call in 1 week(s).   Specialty: Urology Why: call for voiding trail adn foley removal in 1 wk Contact information: [address] [place] [address] [phone]  No Known Allergies  The results of significant diagnostics from this hospitalization (including imaging, microbiology, ancillary and laboratory) are listed below for reference.    Microbiology: Recent Results (from the past 240 hour(s))  Urine Culture     Status: None   Collection Time: 10/04/20  8:55 AM   Specimen: Urine   UR  Result Value Ref Range Status   Urine Culture, Routine Final report  Final   Organism ID, Bacteria No growth  Final  Resp Panel by RT-PCR (Flu A&B, Covid) Nasopharyngeal Swab     Status: None   Collection Time: 10/05/20 12:56 PM   Specimen: Nasopharyngeal Swab; Nasopharyngeal(NP) swabs in vial transport medium  Result Value Ref Range Status   SARS Coronavirus 2 by RT PCR NEGATIVE NEGATIVE Final    Comment: (NOTE) SARS-CoV-2 target nucleic acids are NOT DETECTED.  The SARS-CoV-2 RNA is generally detectable in upper respiratory specimens during the acute phase of infection.  The lowest concentration of SARS-CoV-2 viral copies this assay can detect is 138 copies/mL. A negative result does not preclude SARS-Cov-2 infection and should not be used as the sole basis for treatment or other patient management decisions. A negative result may occur with  improper specimen collection/handling, submission of specimen other than nasopharyngeal swab, presence of viral mutation(s) within the areas targeted by this assay, and inadequate number of viral copies(<138 copies/mL). A negative result must be combined with clinical observations, patient history, and epidemiological information. The expected result is Negative.  Fact Sheet for Patients:  BloggerCourse.com  Fact Sheet for Healthcare Providers:  SeriousBroker.it  This test is no t yet approved or cleared by the [place] FDA and  has been authorized for detection and/or diagnosis of SARS-CoV-2 by FDA under an Emergency Use Authorization (EUA). This EUA will remain  in effect (meaning this test can be used) for the duration of the COVID-19 declaration under Section 564(b)(1) of the Act, 21 U.S.C.section 360bbb-3(b)(1), unless the authorization is terminated  or revoked sooner.       Influenza A by PCR NEGATIVE NEGATIVE Final   Influenza B by PCR NEGATIVE NEGATIVE Final    Comment: (NOTE) The Xpert Xpress SARS-CoV-2/FLU/RSV plus assay is intended as an aid in the diagnosis of influenza from Nasopharyngeal swab specimens and should not be used as a sole basis for treatment. Nasal washings and aspirates are unacceptable for Xpert Xpress SARS-CoV-2/FLU/RSV testing.  Fact Sheet for Patients: BloggerCourse.com  Fact Sheet for Healthcare Providers: SeriousBroker.it  This test is not yet approved or cleared by the [place] FDA and has been authorized for detection and/or diagnosis of SARS-CoV-2 by FDA  under an Emergency Use Authorization (EUA). This EUA will remain in effect (meaning this test can be used) for the duration of the COVID-19 declaration under Section 564(b)(1) of the Act, 21 U.S.C. section 360bbb-3(b)(1), unless the authorization is terminated or revoked.  Performed at [facility], [address], [place], [place] [address]     Procedures/Studies: US RENAL  Result Date: 10/05/2020 CLINICAL DATA:  Renal failure EXAM: RENAL / URINARY TRACT ULTRASOUND COMPLETE COMPARISON:  None. FINDINGS: Right Kidney: Renal measurements: 10.8 x 4.7 x 5.2 cm = volume: 140 mL. Moderate hydronephrosis. Negative for mass or stone Left Kidney: Renal measurements: 10.9 x 5.1 x 3.9 cm = volume: 114 mL. Moderate hydronephrosis. Negative for mass or stone Bladder: Moderately distended bladder with trabeculation and uncomplicated diverticulum. IMPRESSION: 1. Distended bladder with trabeculation and diverticulum suggesting chronic outlet obstruction. 2. Moderate bilateral  hydronephrosis that is likely from #1. Electronically Signed   By: Tiburcio Pea M.D.   On: 10/05/2020 10:57   DG Foot 2 Views Right  Result Date: 10/08/2020 CLINICAL DATA:  Right foot pain. EXAM: RIGHT FOOT - 2 VIEW COMPARISON:  None. FINDINGS: There is no evidence of fracture or dislocation. There is no evidence of arthropathy or other focal bone abnormality. Peripheral vascular calcifications are present. Soft tissues are otherwise unremarkable. IMPRESSION: Negative. Electronically Signed   By: Darliss Cheney M.D.   On: 10/08/2020 21:22    Labs: BNP (last 3 results) Recent Labs    10/05/20 0910  BNP 72.3   Basic Metabolic Panel: Recent Labs  Lab 10/05/20 0910 10/06/20 0333 10/07/20 1000 10/08/20 0159 10/09/20 0418  NA 134* 134* 140 137 135  K 4.7 3.7 3.6 3.7 4.0  CL 104 105 108 105 100  CO2 18* 19* 24 24 26   GLUCOSE 106* 104* 94 88 102*  BUN 68* 53* 37* 31* 29*  CREATININE 8.79* 6.25* 4.18* 3.66* 3.05*   CALCIUM 9.0 8.9 8.7* 8.5* 8.6*   Liver Function Tests: Recent Labs  Lab 10/04/20 0842 10/05/20 0910  AST 23 21  ALT 9 10  ALKPHOS 129* 100  BILITOT 0.5 0.5  PROT 7.7 7.6  ALBUMIN 4.5 3.9   No results for input(s): LIPASE, AMYLASE in the last 168 hours. No results for input(s): AMMONIA in the last 168 hours. CBC: Recent Labs  Lab 10/04/20 0842 10/05/20 0910  WBC 6.4 6.9  NEUTROABS 4.0 4.5  HGB 10.9* 9.9*  HCT 32.4* 29.9*  MCV 77* 79.3*  PLT 296 270   Cardiac Enzymes: Recent Labs  Lab 10/05/20 1148  CKTOTAL 441*   BNP: Invalid input(s): POCBNP CBG: No results for input(s): GLUCAP in the last 168 hours. D-Dimer No results for input(s): DDIMER in the last 72 hours. Hgb A1c No results for input(s): HGBA1C in the last 72 hours. Lipid Profile No results for input(s): CHOL, HDL, LDLCALC, TRIG, CHOLHDL, LDLDIRECT in the last 72 hours. Thyroid function studies No results for input(s): TSH, T4TOTAL, T3FREE, THYROIDAB in the last 72 hours.  Invalid input(s): FREET3 Anemia work up No results for input(s): VITAMINB12, FOLATE, FERRITIN, TIBC, IRON, RETICCTPCT in the last 72 hours. Urinalysis    Component Value Date/Time   COLORURINE STRAW (A) 10/05/2020 1123   APPEARANCEUR CLEAR 10/05/2020 1123   APPEARANCEUR Clear 10/04/2020 0855   LABSPEC 1.006 10/05/2020 1123   PHURINE 5.0 10/05/2020 1123   GLUCOSEU NEGATIVE 10/05/2020 1123   HGBUR SMALL (A) 10/05/2020 1123   BILIRUBINUR NEGATIVE 10/05/2020 1123   BILIRUBINUR Negative 10/04/2020 0855   KETONESUR NEGATIVE 10/05/2020 1123   PROTEINUR NEGATIVE 10/05/2020 1123   UROBILINOGEN negative 12/15/2012 1806   NITRITE NEGATIVE 10/05/2020 1123   LEUKOCYTESUR NEGATIVE 10/05/2020 1123   Sepsis Labs Invalid input(s): PROCALCITONIN,  WBC,  LACTICIDVEN Microbiology Recent Results (from the past 240 hour(s))  Urine Culture     Status: None   Collection Time: 10/04/20  8:55 AM   Specimen: Urine   UR  Result Value Ref  Range Status   Urine Culture, Routine Final report  Final   Organism ID, Bacteria No growth  Final  Resp Panel by RT-PCR (Flu A&B, Covid) Nasopharyngeal Swab     Status: None   Collection Time: 10/05/20 12:56 PM   Specimen: Nasopharyngeal Swab; Nasopharyngeal(NP) swabs in vial transport medium  Result Value Ref Range Status   SARS Coronavirus 2 by RT PCR NEGATIVE NEGATIVE Final  Comment: (NOTE) SARS-CoV-2 target nucleic acids are NOT DETECTED.  The SARS-CoV-2 RNA is generally detectable in upper respiratory specimens during the acute phase of infection. The lowest concentration of SARS-CoV-2 viral copies this assay can detect is 138 copies/mL. A negative result does not preclude SARS-Cov-2 infection and should not be used as the sole basis for treatment or other patient management decisions. A negative result may occur with  improper specimen collection/handling, submission of specimen other than nasopharyngeal swab, presence of viral mutation(s) within the areas targeted by this assay, and inadequate number of viral copies(<138 copies/mL). A negative result must be combined with clinical observations, patient history, and epidemiological information. The expected result is Negative.  Fact Sheet for Patients:  BloggerCourse.com  Fact Sheet for Healthcare Providers:  SeriousBroker.it  This test is no t yet approved or cleared by the [place] FDA and  has been authorized for detection and/or diagnosis of SARS-CoV-2 by FDA under an Emergency Use Authorization (EUA). This EUA will remain  in effect (meaning this test can be used) for the duration of the COVID-19 declaration under Section 564(b)(1) of the Act, 21 U.S.C.section 360bbb-3(b)(1), unless the authorization is terminated  or revoked sooner.       Influenza A by PCR NEGATIVE NEGATIVE Final   Influenza B by PCR NEGATIVE NEGATIVE Final    Comment: (NOTE) The Xpert  Xpress SARS-CoV-2/FLU/RSV plus assay is intended as an aid in the diagnosis of influenza from Nasopharyngeal swab specimens and should not be used as a sole basis for treatment. Nasal washings and aspirates are unacceptable for Xpert Xpress SARS-CoV-2/FLU/RSV testing.  Fact Sheet for Patients: BloggerCourse.com  Fact Sheet for Healthcare Providers: SeriousBroker.it  This test is not yet approved or cleared by the [place] FDA and has been authorized for detection and/or diagnosis of SARS-CoV-2 by FDA under an Emergency Use Authorization (EUA). This EUA will remain in effect (meaning this test can be used) for the duration of the COVID-19 declaration under Section 564(b)(1) of the Act, 21 U.S.C. section 360bbb-3(b)(1), unless the authorization is terminated or revoked.  Performed at [facility], [address] [place], [place] [address]      Time coordinating discharge: 25 minutes  SIGNED: Lanae Boast, MD  [facility] 10/09/2020, 9:17 AM  If 7PM-7AM, please contact night-coverage [email or web address]

## 2020-10-09 NOTE — Progress Notes (Signed)
DISCHARGE NOTE HOME BRYLEN WAGAR to be discharged Home per MD order. Discussed prescriptions and follow up appointments with the patient. Prescriptions given to patient; medication list explained in detail. Patient verbalized understanding.  Skin clean, dry and intact without evidence of skin break down, no evidence of skin tears noted. IV catheter discontinued intact. Site without signs and symptoms of complications. Dressing and pressure applied. Pt denies pain at the site currently. No complaints noted.  Patient has urinary foley in will see urology outpatient.   An After Visit Summary (AVS) was printed and given to the patient. Patient escorted via wheelchair, and discharged home via private auto.  Lorine Bears, RN

## 2020-10-09 NOTE — TOC Transition Note (Signed)
Transition of Care [facility]) - CM/SW Discharge Note   Patient Details  Name: Luke Davila MRN: [id number] Date of Birth: 1965-07-06  [facility]) CM/SW Contact:  Tom-Johnson, Hershal Coria, RN Phone Number: 10/09/2020, 9:10 AM   Clinical Narrative:    Patient lives alone. PMH of sarcoidosis, BPH, Glaucoma, Gout, Avascular necrosis of left hip. Has two children and divorced. Children lives with their mother. Admitted for  urinary incontinence, dyspnea with exertion, increased thirst, headache, difficult time holding urine for the past month. Foley cath placed and will be discharged with foley in place, to follow up with Urologist outpatient. Employed by [employer] and independent with ADL's and mobility. Able to drive self to and from appointments.Has a walker he used from his last left hip surgery but does not use it now. Drinks beer occasionally and does not feel he needs counseling. Scheduled for discharge home today. Ex wife to transport at discharge. No PT/OT recommendation. Denies any needs. No further TOC needs at this time.    Final next level of care: Home/Self Care Barriers to Discharge: No Barriers Identified   Patient Goals and CMS Choice Patient states their goals for this hospitalization and ongoing recovery are:: To go home CMS Medicare.gov Compare Post Acute Care list provided to:: Patient    Discharge Placement                       Discharge Plan and Services                DME Arranged: N/A DME Agency: NA       HH Arranged: NA HH Agency: NA        Social Determinants of Health (SDOH) Interventions     Readmission Risk Interventions No flowsheet data found.

## 2020-10-10 ENCOUNTER — Telehealth: Payer: Self-pay

## 2020-10-10 NOTE — Telephone Encounter (Signed)
Transition Care Management Follow-up Telephone Call Date of discharge and from where: [facility] 10/09/20 Diagnosis:  Renal Failure, obstructive uropathy How have you been since you were released from the hospital? Feels better Any questions or concerns? No  Items Reviewed: Did the pt receive and understand the discharge instructions provided? Yes  Medications obtained and verified? Yes  Other? No  Any new allergies since your discharge? No  Dietary orders reviewed? Yes Do you have support at home? Yes   Home Care and Equipment/Supplies: Were home health services ordered? no If so, what is the name of the agency? N/a  Has the agency set up a time to come to the patient's home? not applicable Were any new equipment or medical supplies ordered?  No - he has Foley Catheter at this time - has appt to see Urology to have it removed Tuesday What is the name of the medical supply agency? N/a  Were you able to get the supplies/equipment? not applicable Do you have any questions related to the use of the equipment or supplies? No  Functional Questionnaire: (I = Independent and D = Dependent) ADLs: I  Bathing/Dressing- I  Meal Prep- I  Eating- I  Maintaining continence- D - FOLEY  Transferring/Ambulation- D  Managing Meds- D  Follow up appointments reviewed:  PCP Hospital f/u appt confirmed? Yes  Scheduled to see On[place] on 10/10 @ 1:45. Specialist Hospital f/u appt confirmed? Yes  Scheduled to see Urology on 10/16/20  Are transportation arrangements needed? No  If their condition worsens, is the pt aware to call PCP or go to the Emergency Dept.? Yes Was the patient provided with contact information for the PCP's office or ED? Yes Was to pt encouraged to call back with questions or concerns? Yes

## 2020-10-14 ENCOUNTER — Ambulatory Visit
Admission: RE | Admit: 2020-10-14 | Discharge: 2020-10-14 | Disposition: A | Discharge status: Home / Self Care | Payer: 59 | Source: Ambulatory Visit | Attending: Family Medicine | Admitting: Family Medicine

## 2020-10-14 DIAGNOSIS — M25562 Pain in left knee: Secondary | ICD-10-CM

## 2020-10-14 DIAGNOSIS — G8929 Other chronic pain: Secondary | ICD-10-CM

## 2020-10-15 ENCOUNTER — Encounter: Payer: Self-pay | Admitting: Nurse Practitioner

## 2020-10-15 ENCOUNTER — Other Ambulatory Visit: Payer: Self-pay

## 2020-10-15 ENCOUNTER — Ambulatory Visit: Payer: 59 | Admitting: Nurse Practitioner

## 2020-10-15 VITALS — BP 108/66 | HR 85 | Temp 97.9°F | Ht 75.0 in | Wt 254.0 lb

## 2020-10-15 DIAGNOSIS — Z09 Encounter for follow-up examination after completed treatment for conditions other than malignant neoplasm: Secondary | ICD-10-CM | POA: Insufficient documentation

## 2020-10-15 DIAGNOSIS — E349 Endocrine disorder, unspecified: Secondary | ICD-10-CM

## 2020-10-15 DIAGNOSIS — E291 Testicular hypofunction: Secondary | ICD-10-CM | POA: Diagnosis not present

## 2020-10-15 DIAGNOSIS — N139 Obstructive and reflux uropathy, unspecified: Secondary | ICD-10-CM

## 2020-10-15 MED ORDER — TESTOSTERONE CYPIONATE 200 MG/ML IM SOLN
200.0000 mg | INTRAMUSCULAR | Status: DC
  Administered 2020-10-15 – 2020-10-29 (×2): 200 mg via INTRAMUSCULAR

## 2020-10-15 NOTE — Progress Notes (Signed)
Established Patient Office Visit  Subjective:  Patient ID: Luke Davila, male    DOB: 02/08/1965  Age: 55 y.o. MRN: [id number]  CC:  Chief Complaint  Patient presents with   Transitions Of Care   Obstructive uropathy    HPI Luke Davila presents for Today's visit was for Transitional Care Management.  The patient was discharged from [facility] on 10/09/2020 with a primary diagnosis of renal failure, obstructive uropathy.   Contact with the patient and/or caregiver, by a clinical staff member, was made on 10/10/2020, and was documented as a telephone encounter within the EMR.  Through chart review and discussion with the patient I have determined that management of their condition is of moderate complexity.   Obstructive uropathy follow-up   Patient presented to the emergency department with high blood pressure.  Ultrasound showed distended bladder with trabeculation and diverticulum suggesting chronic outlet obstruction, moderate bilateral hydronephrosis.  Foley catheter placed patient was admitted for AKI and obstructive uropathy.  Patient was seen by nephrology.  In the hospital patient was managed with IV fluids and Foley catheter maintenance.  Patient was stabilized and discharged to follow-up with urology.   For uncontrolled blood pressures patient was started on amlodipine 5 mg tablet by mouth and steroids for acute gout flareup which showed up in his- labs uric acid elevated 9.1.     Past Medical History:  Diagnosis Date   Avascular necrosis of hip ([place])    LEFT   Enlarged prostate    Glaucoma    Gout    History of gout     Past Surgical History:  Procedure Laterality Date   COLONOSCOPY WITH PROPOFOL N/A 11/27/2015   Procedure: COLONOSCOPY WITH PROPOFOL;  Surgeon: Danie Binder, MD;  Location: [facility];  Service: Endoscopy;  Laterality: N/A;  1100   FOOT SURGERY Left 1985   Left hand surgery     TOTAL HIP ARTHROPLASTY Left 04/09/2015   Procedure: LEFT  TOTAL HIP ARTHROPLASTY ANTERIOR APPROACH;  Surgeon: Gaynelle Arabian, MD;  Location: [facility] ORS;  Service: Orthopedics;  Laterality: Left;    Family History  Problem Relation Age of Onset   Hypertension Mother    Colon cancer Neg Hx     Social History   Socioeconomic History   Marital status: Divorced    Spouse name: Ramona   Number of children: 2   Years of education: 12th   Highest education level: Not on file  Occupational History    Employer: [employer]    Comment: Public house manager  Tobacco Use   Smoking status: Never   Smokeless tobacco: Never  Vaping Use   Vaping Use: Not on file  Substance and Sexual Activity   Alcohol use: Yes    Comment: 3 beers daily   Drug use: No   Sexual activity: Yes    Birth control/protection: None  Other Topics Concern   Not on file  Social History Narrative   Patient lives at home with his family.   Caffeine Use: 1 soda daily   Patient is right handed   Social Determinants of Health   Financial Resource Strain: Not on file  Food Insecurity: Not on file  Transportation Needs: Not on file  Physical Activity: Not on file  Stress: Not on file  Social Connections: Not on file  Intimate Partner Violence: Not on file    Outpatient Medications Prior to Visit  Medication Sig Dispense Refill   albuterol (VENTOLIN HFA) 108 (90 Base) MCG/ACT  inhaler Inhale 2 puffs into the lungs every 6 (six) hours as needed for wheezing or shortness of breath. 1 each 11   amLODipine (NORVASC) 5 MG tablet Take 1 tablet (5 mg total) by mouth daily. For blood pressure 30 tablet 5   augmented betamethasone dipropionate (DIPROLENE-AF) 0.05 % cream Apply topically 2 (two) times daily. At affected areas (avoid face and genitals) (Patient not taking: Reported on 10/05/2020) 50 g 1   solifenacin (VESICARE) 10 MG tablet Take 1 tablet (10 mg total) by mouth daily. 90 tablet 3   tamsulosin (FLOMAX) 0.4 MG CAPS capsule TAKE 2 CAPSULES (0.8 MG TOTAL) BY MOUTH DAILY AFTER  SUPPER. (Patient not taking: No sig reported) 180 capsule 0   testosterone cypionate (DEPOTESTOSTERONE CYPIONATE) 200 MG/ML injection INJECT 1 ML (200 MG TOTAL) INTO THE MUSCLE EVERY 14 (FOURTEEN) DAYS. 10 mL 0   zolpidem (AMBIEN) 5 MG tablet Take 1 tablet (5 mg total) by mouth at bedtime as needed for sleep. (Patient not taking: No sig reported) 30 tablet 2   No facility-administered medications prior to visit.    No Known Allergies  ROS Review of Systems  HENT: Negative.    Respiratory: Negative.    Gastrointestinal: Negative.   Genitourinary:        Foley catheter   Skin:  Negative for rash.  All other systems reviewed and are negative.    Objective:    Physical Exam Vitals and nursing note reviewed.  Constitutional:      Appearance: Normal appearance.  HENT:     Head: Normocephalic.     Right Ear: Ear canal normal.     Nose: Nose normal.     Mouth/Throat:     Mouth: Mucous membranes are moist.     Pharynx: Oropharynx is clear.  Eyes:     Conjunctiva/sclera: Conjunctivae normal.  Cardiovascular:     Rate and Rhythm: Normal rate and regular rhythm.     Pulses: Normal pulses.     Heart sounds: Normal heart sounds.  Abdominal:     General: Bowel sounds are normal.  Skin:    General: Skin is warm.  Neurological:     Mental Status: He is alert and oriented to person, place, and time.  Psychiatric:        Behavior: Behavior normal.    BP 108/66   Pulse 85   Temp 97.9 F (36.6 C) (Temporal)   Ht _0  (1.905 m)   Wt 254 lb (115.2 kg)   BMI 31.75 kg/m  Wt Readings from Last 3 Encounters:  10/15/20 254 lb (115.2 kg)  10/06/20 248 lb 14.4 oz (112.9 kg)  10/04/20 258 lb 3.2 oz (117.1 kg)     Health Maintenance Due  Topic Date Due   Zoster Vaccines- Shingrix (1 of 2) Never done   COVID-19 Vaccine (3 - Booster for Moderna series) 07/07/2020   INFLUENZA VACCINE  Never done    There are no preventive care reminders to display for this patient.  Lab Results   Component Value Date   TSH 2.970 07/15/2018   Lab Results  Component Value Date   WBC 6.9 10/05/2020   HGB 9.9 (L) 10/05/2020   HCT 29.9 (L) 10/05/2020   MCV 79.3 (L) 10/05/2020   PLT 270 10/05/2020   Lab Results  Component Value Date   NA 135 10/09/2020   K 4.0 10/09/2020   CO2 26 10/09/2020   GLUCOSE 102 (H) 10/09/2020   BUN 29 (H) 10/09/2020  CREATININE 3.05 (H) 10/09/2020   BILITOT 0.5 10/05/2020   ALKPHOS 100 10/05/2020   AST 21 10/05/2020   ALT 10 10/05/2020   PROT 7.6 10/05/2020   ALBUMIN 3.9 10/05/2020   CALCIUM 8.6 (L) 10/09/2020   ANIONGAP 9 10/09/2020   EGFR 7 (L) 10/04/2020   Lab Results  Component Value Date   CHOL 223 (H) 03/05/2020   Lab Results  Component Value Date   HDL 57 03/05/2020   Lab Results  Component Value Date   LDLCALC 140 (H) 03/05/2020   Lab Results  Component Value Date   TRIG 144 03/05/2020   Lab Results  Component Value Date   CHOLHDL 3.9 03/05/2020   Lab Results  Component Value Date   HGBA1C 5.7 07/31/2020      Assessment & Plan:   Problem List Items Addressed This Visit       Genitourinary   Obstructive uropathy    Symptoms gradually resolving no new concerns, blood pressure well controlled.         Other   Hospital discharge follow-up - Primary   Relevant Orders   Basic Metabolic Panel   CBC with Differential    No orders of the defined types were placed in this encounter.   Follow-up: Return if symptoms worsen or fail to improve.    Ivy Lynn, NP

## 2020-10-15 NOTE — Addendum Note (Signed)
Addended byDory Peru on: 10/15/2020 03:27 PM   Modules accepted: Orders

## 2020-10-15 NOTE — Assessment & Plan Note (Signed)
Symptoms gradually resolving no new concerns, blood pressure well controlled.

## 2020-10-15 NOTE — Patient Instructions (Signed)
Hydronephrosis ?Hydronephrosis is the swelling of one or both kidneys due to a blockage that stops urine from flowing out of the body. Kidneys filter waste from the blood and produce urine. This condition can lead to kidney failure and may become life-threatening if not treated promptly. ?What are the causes? ?In infants and children, common causes include problems that occur when a baby is developing in the womb. These can include problems in the kidneys or in the tubes that drain urine into the bladder (ureters). ?In adults, common causes include: ?Kidney stones. ?Pregnancy. ?A tumor or cyst in the abdomen or pelvis. ?An enlarged prostate gland. ?Other causes include: ?Bladder infection. ?Scar tissue from a previous surgery or injury. ?A blood clot. ?Cancer of the prostate, bladder, uterus, ovary, or colon. ?What are the signs or symptoms? ?Symptoms of this condition include: ?Pain or discomfort in your side (flank) or abdomen. ?Swelling in your abdomen. ?Nausea and vomiting. ?Fever. ?Pain when passing urine. ?Feelings of urgency when you need to urinate. ?Urinating more often than normal. ?In some cases, you may not have any symptoms. ?How is this diagnosed? ?This condition may be diagnosed based on: ?Your symptoms and medical history. ?A physical exam. ?Blood and urine tests. ?Imaging tests, such as an ultrasound, CT scan, or MRI. ?A procedure to look at your urinary tract and bladder by inserting a scope into the urethra (cystoscopy). ?How is this treated? ?Treatment for this condition depends on where the blockage is, how long it has been there, and what caused it. The goal of treatment is to remove the blockage. Treatment may include: ?Antibiotic medicines to treat or prevent infection. ?A procedure to place a small, thin tube (stent) into a blocked ureter. The stent will keep the ureter open so that urine can drain through it. ?A nonsurgical procedure that crushes kidney stones with shock waves  (extracorporeal shock wave lithotripsy). ?If kidney failure occurs, treatment may include dialysis or a kidney transplant. ?Follow these instructions at home: ? ?Take over-the-counter and prescription medicines only as told by your health care provider. ?If you were prescribed an antibiotic medicine, take it exactly as told by your health care provider. Do not stop taking the antibiotic even if you start to feel better. ?Rest and return to your normal activities as told by your health care provider. Ask your health care provider what activities are safe for you. ?Drink enough fluid to keep your urine pale yellow. ?Keep all follow-up visits. This is important. ?Contact a health care provider if: ?You continue to have symptoms after treatment. ?You develop new symptoms. ?Your urine becomes cloudy or bloody. ?You have a fever. ?Get help right away if: ?You have severe flank or abdominal pain. ?You cannot drink fluids without vomiting. ?Summary ?Hydronephrosis is the swelling of one or both kidneys due to a blockage that stops urine from flowing out of the body. ?Hydronephrosis can lead to kidney failure and may become life-threatening if not treated promptly. ?The goal of treatment is to remove the blockage. It may include a procedure to insert a stent into a blocked ureter, a procedure to break up kidney stones, or taking antibiotic medicines. ?Follow your health care provider's instructions for taking care of yourself at home, including instructions about drinking fluids, taking medicines, and limiting activities. ?This information is not intended to replace advice given to you by your health care provider. Make sure you discuss any questions you have with your health care provider. ?Document Revised: 04/12/2019 Document Reviewed: 04/12/2019 ?Elsevier Patient   Education ? 2022 [employer] ? ?

## 2020-10-16 ENCOUNTER — Telehealth: Payer: Self-pay | Admitting: Family Medicine

## 2020-10-16 ENCOUNTER — Emergency Department (HOSPITAL_COMMUNITY)
Admission: EM | Admit: 2020-10-16 | Discharge: 2020-10-16 | Disposition: A | Discharge status: Home / Self Care | Payer: 59 | Attending: Emergency Medicine | Admitting: Emergency Medicine

## 2020-10-16 ENCOUNTER — Other Ambulatory Visit: Payer: Self-pay

## 2020-10-16 ENCOUNTER — Encounter (HOSPITAL_COMMUNITY): Payer: Self-pay

## 2020-10-16 ENCOUNTER — Emergency Department (HOSPITAL_COMMUNITY): Payer: 59

## 2020-10-16 DIAGNOSIS — Z96642 Presence of left artificial hip joint: Secondary | ICD-10-CM | POA: Insufficient documentation

## 2020-10-16 DIAGNOSIS — N179 Acute kidney failure, unspecified: Secondary | ICD-10-CM | POA: Insufficient documentation

## 2020-10-16 DIAGNOSIS — R000 Tachycardia, unspecified: Secondary | ICD-10-CM | POA: Insufficient documentation

## 2020-10-16 DIAGNOSIS — I10 Essential (primary) hypertension: Secondary | ICD-10-CM | POA: Diagnosis not present

## 2020-10-16 DIAGNOSIS — Z79899 Other long term (current) drug therapy: Secondary | ICD-10-CM | POA: Insufficient documentation

## 2020-10-16 DIAGNOSIS — R42 Dizziness and giddiness: Secondary | ICD-10-CM | POA: Diagnosis present

## 2020-10-16 LAB — URINALYSIS, ROUTINE W REFLEX MICROSCOPIC
Bilirubin Urine: NEGATIVE
Glucose, UA: NEGATIVE mg/dL
Ketones, ur: NEGATIVE mg/dL
Leukocytes,Ua: NEGATIVE
Nitrite: NEGATIVE
Protein, ur: NEGATIVE mg/dL
Specific Gravity, Urine: 1.009 (ref 1.005–1.030)
pH: 5 (ref 5.0–8.0)

## 2020-10-16 LAB — CBC WITH DIFFERENTIAL/PLATELET
Basophils Absolute: 0.1 10*3/uL (ref 0.0–0.2)
Basos: 1 %
EOS (ABSOLUTE): 0.2 10*3/uL (ref 0.0–0.4)
Eos: 2 %
Hematocrit: 31.1 % — ABNORMAL LOW (ref 37.5–51.0)
Hemoglobin: 10.2 g/dL — ABNORMAL LOW (ref 13.0–17.7)
Immature Grans (Abs): 0.2 10*3/uL — ABNORMAL HIGH (ref 0.0–0.1)
Immature Granulocytes: 2 %
Lymphocytes Absolute: 2.2 10*3/uL (ref 0.7–3.1)
Lymphs: 22 %
MCH: 25.9 pg — ABNORMAL LOW (ref 26.6–33.0)
MCHC: 32.8 g/dL (ref 31.5–35.7)
MCV: 79 fL (ref 79–97)
Monocytes Absolute: 0.8 10*3/uL (ref 0.1–0.9)
Monocytes: 8 %
Neutrophils Absolute: 6.5 10*3/uL (ref 1.4–7.0)
Neutrophils: 65 %
Platelets: 314 10*3/uL (ref 150–450)
RBC: 3.94 x10E6/uL — ABNORMAL LOW (ref 4.14–5.80)
RDW: 13.1 % (ref 11.6–15.4)
WBC: 9.9 10*3/uL (ref 3.4–10.8)

## 2020-10-16 LAB — CBC
HCT: 34.9 % — ABNORMAL LOW (ref 39.0–52.0)
Hemoglobin: 11.1 g/dL — ABNORMAL LOW (ref 13.0–17.0)
MCH: 26.3 pg (ref 26.0–34.0)
MCHC: 31.8 g/dL (ref 30.0–36.0)
MCV: 82.7 fL (ref 80.0–100.0)
Platelets: 334 10*3/uL (ref 150–400)
RBC: 4.22 MIL/uL (ref 4.22–5.81)
RDW: 13.8 % (ref 11.5–15.5)
WBC: 10.9 10*3/uL — ABNORMAL HIGH (ref 4.0–10.5)
nRBC: 0 % (ref 0.0–0.2)

## 2020-10-16 LAB — BASIC METABOLIC PANEL
Anion gap: 8 (ref 5–15)
BUN/Creatinine Ratio: 15 (ref 9–20)
BUN: 32 mg/dL — ABNORMAL HIGH (ref 6–24)
BUN: 39 mg/dL — ABNORMAL HIGH (ref 6–20)
CO2: 23 mmol/L (ref 20–29)
CO2: 24 mmol/L (ref 22–32)
Calcium: 9.3 mg/dL (ref 8.7–10.2)
Calcium: 8.5 mg/dL — ABNORMAL LOW (ref 8.9–10.3)
Chloride: 103 mmol/L (ref 96–106)
Chloride: 101 mmol/L (ref 98–111)
Creatinine, Ser: 2.09 mg/dL — ABNORMAL HIGH (ref 0.76–1.27)
Creatinine, Ser: 2.24 mg/dL — ABNORMAL HIGH (ref 0.61–1.24)
GFR, Estimated: 34 mL/min — ABNORMAL LOW (ref >60)
Glucose, Bld: 81 mg/dL (ref 70–99)
Glucose: 91 mg/dL (ref 70–99)
Potassium: 4.6 mmol/L (ref 3.5–5.2)
Potassium: 4.1 mmol/L (ref 3.5–5.1)
Sodium: 140 mmol/L (ref 134–144)
Sodium: 133 mmol/L — ABNORMAL LOW (ref 135–145)
eGFR: 37 mL/min/{1.73_m2} — ABNORMAL LOW (ref >59)

## 2020-10-16 LAB — TROPONIN I (HIGH SENSITIVITY)
Troponin I (High Sensitivity): 14 ng/L (ref <18)
Troponin I (High Sensitivity): 16 ng/L (ref <18)

## 2020-10-16 LAB — CBG MONITORING, ED: Glucose-Capillary: 83 mg/dL (ref 70–99)

## 2020-10-16 MED ORDER — SODIUM CHLORIDE 0.9 % IV BOLUS
500.0000 mL | Freq: Once | INTRAVENOUS | Status: AC
  Administered 2020-10-16: 500 mL via INTRAVENOUS

## 2020-10-16 MED ORDER — SODIUM CHLORIDE 0.9 % IV SOLN: INTRAVENOUS | Status: DC

## 2020-10-16 MED ORDER — METOPROLOL TARTRATE 5 MG/5ML IV SOLN
5.0000 mg | Freq: Once | INTRAVENOUS | Status: AC
  Administered 2020-10-16: 5 mg via INTRAVENOUS
  Filled 2020-10-16: qty 5

## 2020-10-16 NOTE — Discharge Instructions (Signed)
Your heart rate and blood pressure have improved.  It is not clear exactly what caused him to be abnormal.  Make sure you are getting plenty of rest and drinking a lot of fluids.  Try to eat 3 meals each day.  If you continue to feel dizzy or weak, return here or see your doctor immediately.  Otherwise see your primary care doctor for blood pressure check in 1 or 2 weeks.  The urology office will call you to schedule a follow-up appointment to see about removing the catheter.  If you had not heard from them by tomorrow give them a call.

## 2020-10-16 NOTE — ED Provider Notes (Signed)
[facility]-EMERGENCY DEPT Provider Note   CSN: [id number] Arrival date & time: 10/16/20  8250     History No chief complaint on file.   Luke Davila is a 55 y.o. male.  HPI He presents for evaluation of lightheaded feeling and numbness of the lips, which he noticed this morning after he awoke, ate breakfast and took his medications.  He was due to have his urinary catheter removed today by urology.  He drove his own vehicle here but stated it was hard because he was feeling so lightheaded.  He denies chest pain, shortness of breath, abdominal or back pain.  He was hospitalized last week with acute renal failure, felt to be from obstructive uropathy.  He was started on amlodipine for high blood pressure, and prednisone for gout.  He states his foot pain has improved after starting the prednisone.  He has history of hypertension but was only recently begun on antihypertensive medication.  He saw his PCP yesterday and had lab work done to follow-up on prior abnormalities.  There are no other known acute modifying factors.    Past Medical History:  Diagnosis Date   Avascular necrosis of hip (HCC)    LEFT   Enlarged prostate    Glaucoma    Gout    History of gout     Patient Active Problem List   Diagnosis Date Noted   Hospital discharge follow-up 10/15/2020   BPH (benign prostatic hyperplasia) 10/05/2020   Obstructive uropathy 10/05/2020   AKI (acute kidney injury) (HCC) 10/05/2020   Sarcoidosis of lung (HCC) 09/30/2019   Primary insomnia 02/14/2019   Bilateral primary osteoarthritis of knee 03/24/2018   PVC's (premature ventricular contractions) 02/03/2018   Hyperlipidemia 02/03/2018   Hyponatremia 08/06/2016   Constipation 10/29/2015   OA (osteoarthritis) of hip 04/09/2015   Hypogonadism in male 09/12/2014   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 09/29/2013   Visual field loss 09/29/2013   Erectile dysfunction 06/25/2012   Gout 06/25/2012    Past  Surgical History:  Procedure Laterality Date   COLONOSCOPY WITH PROPOFOL N/A 11/27/2015   Procedure: COLONOSCOPY WITH PROPOFOL;  Surgeon: West Bali, MD;  Location: [facility];  Service: Endoscopy;  Laterality: N/A;  1100   FOOT SURGERY Left 1985   Left hand surgery     TOTAL HIP ARTHROPLASTY Left 04/09/2015   Procedure: LEFT TOTAL HIP ARTHROPLASTY ANTERIOR APPROACH;  Surgeon: Ollen Gross, MD;  Location: [facility] ORS;  Service: Orthopedics;  Laterality: Left;       Family History  Problem Relation Age of Onset   Hypertension Mother    Colon cancer Neg Hx     Social History   Tobacco Use   Smoking status: Never   Smokeless tobacco: Never  Vaping Use   Vaping Use: Never used  Substance Use Topics   Alcohol use: Yes    Comment: 3 beers daily   Drug use: No    Home Medications Prior to Admission medications   Medication Sig Start Date End Date Taking? Authorizing Provider  albuterol (VENTOLIN HFA) 108 (90 Base) MCG/ACT inhaler Inhale 2 puffs into the lungs every 6 (six) hours as needed for wheezing or shortness of breath. 09/29/19   Mechele Claude, MD  amLODipine (NORVASC) 5 MG tablet Take 1 tablet (5 mg total) by mouth daily. For blood pressure 10/04/20   Mechele Claude, MD  augmented betamethasone dipropionate (DIPROLENE-AF) 0.05 % cream Apply topically 2 (two) times daily. At affected areas (  avoid face and genitals) Patient not taking: Reported on 10/05/2020 02/14/19   Mechele Claude, MD  solifenacin (VESICARE) 10 MG tablet Take 1 tablet (10 mg total) by mouth daily. 10/04/20 10/04/21  Mechele Claude, MD  tamsulosin (FLOMAX) 0.4 MG CAPS capsule TAKE 2 CAPSULES (0.8 MG TOTAL) BY MOUTH DAILY AFTER SUPPER. Patient not taking: No sig reported 08/16/20   Mechele Claude, MD  testosterone cypionate (DEPOTESTOSTERONE CYPIONATE) 200 MG/ML injection INJECT 1 ML (200 MG TOTAL) INTO THE MUSCLE EVERY 14 (FOURTEEN) DAYS. 08/27/20   Mechele Claude, MD    Allergies    Patient has no known  allergies.  Review of Systems   Review of Systems  All other systems reviewed and are negative.  Physical Exam Updated Vital Signs BP 130/89   Pulse 78   Temp 97.7 F (36.5 C) (Oral)   Resp (!) 21   Ht 6\' 3"  (1.905 m)   Wt 117 kg   SpO2 100%   BMI 32.25 kg/m   Physical Exam Vitals and nursing note reviewed.  Constitutional:      General: He is in acute distress (He is uncomfortable).     Appearance: He is well-developed. He is not ill-appearing, toxic-appearing or diaphoretic.  HENT:     Head: Normocephalic and atraumatic.     Right Ear: External ear normal.     Left Ear: External ear normal.  Eyes:     Conjunctiva/sclera: Conjunctivae normal.     Pupils: Pupils are equal, round, and reactive to light.  Neck:     Trachea: Phonation normal.  Cardiovascular:     Rate and Rhythm: Normal rate and regular rhythm.     Heart sounds: Normal heart sounds.  Pulmonary:     Effort: Pulmonary effort is normal.     Breath sounds: Normal breath sounds.  Abdominal:     General: There is no distension.     Palpations: Abdomen is soft.     Tenderness: There is no abdominal tenderness.  Musculoskeletal:        General: Normal range of motion.     Cervical back: Normal range of motion and neck supple.  Skin:    General: Skin is warm and dry.  Neurological:     Mental Status: He is alert and oriented to person, place, and time.     Cranial Nerves: No cranial nerve deficit.     Sensory: No sensory deficit.     Motor: No abnormal muscle tone.     Coordination: Coordination normal.  Psychiatric:        Mood and Affect: Mood normal.        Behavior: Behavior normal.        Thought Content: Thought content normal.        Judgment: Judgment normal.    ED Results / Procedures / Treatments   Labs (all labs ordered are listed, but only abnormal results are displayed) Labs Reviewed  BASIC METABOLIC PANEL - Abnormal; Notable for the following components:      Result Value   Sodium  133 (*)    BUN 39 (*)    Creatinine, Ser 2.24 (*)    Calcium 8.5 (*)    GFR, Estimated 34 (*)    All other components within normal limits  CBC - Abnormal; Notable for the following components:   WBC 10.9 (*)    Hemoglobin 11.1 (*)    HCT 34.9 (*)    All other components within normal limits  URINALYSIS, ROUTINE W  REFLEX MICROSCOPIC - Abnormal; Notable for the following components:   Hgb urine dipstick SMALL (*)    Bacteria, UA RARE (*)    All other components within normal limits  CBG MONITORING, ED  TROPONIN I (HIGH SENSITIVITY)  TROPONIN I (HIGH SENSITIVITY)    EKG EKG Interpretation  Date/Time:  Tuesday October 16 2020 09:51:35 EDT Ventricular Rate:  139 PR Interval:    QRS Duration: 111 QT Interval:  318 QTC Calculation: 484 R Axis:   -23 Text Interpretation: Sinus tachycardia Incomplete left bundle branch block LVH with secondary repolarization abnormality Borderline prolonged QT interval Since last tracing rate faster and Non-specific ST-t changes Otherwise no significant change Confirmed by Mancel Bale [phone]) on 10/16/2020 9:59:40 AM   EKG Interpretation  Date/Time:  Tuesday October 16 2020 10:22:04 EDT Ventricular Rate:  131 PR Interval:    QRS Duration: 93 QT Interval:  325 QTC Calculation: 480 R Axis:   -20 Text Interpretation: Sinus tachycardia Borderline left axis deviation Nonspecific T abnormalities, lateral leads Borderline prolonged QT interval Since last tracing of earlier today No significant change was found Confirmed by Mancel Bale [phone]) on 10/16/2020 10:35:58 AM         EKG Interpretation  Date/Time:  Tuesday October 16 2020 13:18:57 EDT Ventricular Rate:  79 PR Interval:  166 QRS Duration: 96 QT Interval:  396 QTC Calculation: 454 R Axis:   -24 Text Interpretation: Sinus rhythm Probable left atrial enlargement Borderline left axis deviation Borderline T wave abnormalities Since last tracing of earlier today now in Normal sinus  rhythm Confirmed by Mancel Bale [phone]) on 10/16/2020 1:22:30 PM          Radiology DG Chest 2 View  Result Date: 10/16/2020 CLINICAL DATA:  Dizziness EXAM: CHEST - 2 VIEW COMPARISON:  05/11/2020 FINDINGS: Increased markings in the upper lobes again noted as seen on prior CT and plain films. No definite acute areas of consolidation. Heart is normal size. No effusions or acute bony abnormality. IMPRESSION: Chronic changes in the upper lobes as seen on prior imaging. No definite acute process. Electronically Signed   By: Charlett Nose M.D.   On: 10/16/2020 11:10    Procedures .Critical Care Performed by: Mancel Bale, MD Authorized by: Mancel Bale, MD   Critical care provider statement:    Critical care time (minutes):  40   Critical care start time:  10/16/2020 10:10 AM   Critical care end time:  10/16/2020 2:14 PM   Critical care time was exclusive of:  Separately billable procedures and treating other patients   Critical care was necessary to treat or prevent imminent or life-threatening deterioration of the following conditions:  Cardiac failure   Critical care was time spent personally by me on the following activities:  Blood draw for specimens, development of treatment plan with patient or surrogate, discussions with consultants, evaluation of patient's response to treatment, examination of patient, ordering and performing treatments and interventions, ordering and review of laboratory studies, ordering and review of radiographic studies, pulse oximetry, re-evaluation of patient's condition and review of old charts   I assumed direction of critical care for this patient from another provider in my specialty: no     Medications Ordered in ED Medications  0.9 %  sodium chloride infusion ( Intravenous New Bag/Given 10/16/20 1148)  sodium chloride 0.9 % bolus 500 mL (0 mLs Intravenous Stopped 10/16/20 1148)  metoprolol tartrate (LOPRESSOR) injection 5 mg (5 mg Intravenous  Given 10/16/20 1206)  sodium chloride 0.9 %  bolus 500 mL (0 mLs Intravenous Stopped 10/16/20 1313)    ED Course  I have reviewed the triage vital signs and the nursing notes.  Pertinent labs & imaging results that were available during my care of the patient were reviewed by me and considered in my medical decision making (see chart for details).  Clinical Course as of 10/16/20 1414  Tue Oct 16, 2020  1208 Case discussed with urology, Dr. Berneice Heinrich, who recommends leave catheter in at this time.  His office will arrange follow-up, for the patient, in case he is discharged.  Otherwise his service can see the patient if he requires hospitalization for [EW]  1319 After receiving metoprolol, 5 mg and IV fluids at 12:06 PM.  Patient has converted to normal sinus rhythm.  Around 12:41 PM, he appears to have converted from sinus tachycardia to normal sinus rhythm.  During this transition there were multiple PVCs seen. [EW]  1410 He was able ambulate easily in the room.  He has been eating and drinking here.  Vital signs have normalized.  Findings discussed with the patient and all questions were answered [EW]    Clinical Course User Index [EW] Mancel Bale, MD   MDM Rules/Calculators/A&P                            Patient Vitals for the past 24 hrs:  BP Temp Temp src Pulse Resp SpO2 Height Weight  10/16/20 1400 130/89 -- -- 78 (!) 21 100 % -- --  10/16/20 1330 124/80 -- -- 80 (!) 22 100 % -- --  10/16/20 1315 -- -- -- -- (!) 22 -- -- --  10/16/20 1300 (!) 127/104 -- -- 80 20 100 % -- --  10/16/20 1230 -- -- -- (!) 114 (!) 30 100 % -- --  10/16/20 1205 110/67 -- -- (!) 132 (!) 27 100 % -- --  10/16/20 1200 112/80 -- -- 78 18 100 % -- --  10/16/20 1150 -- -- -- (!) 133 (!) 25 100 % -- --  10/16/20 1131 106/72 -- -- (!) 115 (!) 22 100 % -- --  10/16/20 1130 122/82 -- -- -- -- 100 % -- --  10/16/20 1100 106/72 -- -- (!) 131 (!) 32 100 % -- --  10/16/20 1030 98/72 -- -- (!) 133 (!) 27 100 % --  --  10/16/20 1021 117/89 -- -- (!) 134 15 99 % -- --  10/16/20 0951 -- -- -- -- -- -- 6\' 3"  (1.905 m) 117 kg  10/16/20 0946 90/67 97.7 F (36.5 C) Oral (!) 140 20 100 % -- --    2:14 PM Reevaluation with update and discussion. After initial assessment and treatment, an updated evaluation reveals he is comfortable and has no further complaints.  Findings discussed and questions answered. Mancel Bale   Medical Decision Making:  This patient is presenting for evaluation of lightheadedness, which does require a range of treatment options, and is a complaint that involves a moderate risk of morbidity and mortality. The differential diagnoses include acute renal failure, metabolic disorder, medication complication. I decided to review old records, and in summary millage male, being treated for acute renal failure at home and new onset hypertension.  He presents with signs and symptoms of cardiovascular instability.  I did not require additional historical information from anyone.  Clinical Laboratory Tests Ordered, included CBC, Metabolic panel, and troponin . Review indicates normal except sodium low, BUN  high, creatinine high, calcium low, GFR low, white count high, hemoglobin low, urinalysis with small amount of blood and rare bacteria. Radiologic Tests Ordered, included chest x-ray.  I independently Visualized: Radiograph images, which show no acute changes  Cardiac Monitor Tracing which shows sinus tachycardia    Critical Interventions-clinical hours, laboratory testing, radiography, IV fluids, observation and reassessment.  Discussion with urology.  After These Interventions, the Patient was reevaluated and was found improved and stable for discharge.  Transient hypotension and tachycardia resolved after IV fluids, and a dose of Lopressor.  Unclear if this was atrial flutter.  No prior history of atrial disease.  Patient with persistent renal insufficiency following significant renal  failure due to obstructive uropathy, last week.  Unable to remove Foley catheter today.  Patient symptomatically better with reassuring vital signs.  He can be treated as an outpatient, with close observation.  CRITICAL CARE-yes Performed by: Mancel Bale  Nursing Notes Reviewed/ Care Coordinated Applicable Imaging Reviewed Interpretation of Laboratory Data incorporated into ED treatment  The patient appears reasonably screened and/or stabilized for discharge and I doubt any other medical condition or other [facility] requiring further screening, evaluation, or treatment in the ED at this time prior to discharge.  Plan: Home Medications-continue usual; Home Treatments-rest, fluids; return here if the recommended treatment, does not improve the symptoms; Recommended follow up-PCP 1 to 2 weeks for blood pressure check.  Urology as soon as possible for further care urinary retention.     Final Clinical Impression(s) / ED Diagnoses Final diagnoses:  Tachycardia  Acute renal failure, unspecified acute renal failure type [facility]    Rx / DC Orders ED Discharge Orders     None        Mancel Bale, MD 10/16/20 1415

## 2020-10-16 NOTE — ED Triage Notes (Signed)
Per EMS-was seen in ED on 9/30 for hypertension-placed on diuretic and BP med-went to urologist today for lab follow up-has not been taking in PO fluids-woke up this am dizzy-was hypotensive and tachardic at urologist-patient is symptomatic

## 2020-10-16 NOTE — ED Notes (Signed)
Pt stated he got a little dizzy when he stood up but "nothing like before." Pt ambulated in room.

## 2020-10-16 NOTE — ED Notes (Signed)
Patient transported to X-ray 

## 2020-10-16 NOTE — ED Notes (Signed)
Pt given water and crackers for Fluid/PO challenge.

## 2020-10-16 NOTE — Telephone Encounter (Signed)
Pt's bp is low and pt is dizzy. Please call back and advise.

## 2020-10-16 NOTE — Telephone Encounter (Signed)
Patient had urology appointment this morning, found to have hypotension and complaining of dizziness, was sent via ambulance to ER from urology office.  Wife is unsure of what BP was at urologist office.  ER notes indicate hypotension and tachycardia in ER.  Patient has ER follow up scheduled with you on Thursday morning but he wants to know what to do until then about taking his blood pressure medication tomorrow.  Patient was advised not to take medication tomorrow until we call him after talking to you.  Patient is aware you are out of the office today and wanted you to address this and not another provider.

## 2020-10-17 NOTE — Telephone Encounter (Signed)
Patient aware and verbalized understanding. °

## 2020-10-17 NOTE — Telephone Encounter (Signed)
Hold off on amlodipine until seen in office.

## 2020-10-18 ENCOUNTER — Encounter: Payer: Self-pay | Admitting: Family Medicine

## 2020-10-18 ENCOUNTER — Other Ambulatory Visit: Payer: Self-pay

## 2020-10-18 ENCOUNTER — Ambulatory Visit: Payer: 59 | Admitting: Family Medicine

## 2020-10-18 VITALS — BP 135/84 | HR 99 | Temp 96.7°F | Ht 75.0 in | Wt 257.8 lb

## 2020-10-18 DIAGNOSIS — N139 Obstructive and reflux uropathy, unspecified: Secondary | ICD-10-CM

## 2020-10-18 NOTE — Progress Notes (Addendum)
Subjective:  Patient ID: Luke Davila, male    DOB: 11-02-1965  Age: 54 y.o. MRN: [id number]  CC: ER follow up   HPI Luke Davila presents for hospital follow up. Admitted on 9/30 due to creatinine of 8. Discharged on October 4 with Foley due to obstructive uropathy from the prostate. Doing well until 2 days ago Luke Davila became dizzy and Blood pressure dropped.  Went to E.D. Had an IV bolus. Amlodipine put on hold. Feeling back to normal today except that the catheter is irritating. Now waiting on a decision from Urology as to when it can be removed. In the E.D. his creatinine remained above 2.   Depression screen [facility] 2/9 11/01/2020 11/01/2020 10/18/2020  Decreased Interest 1 0 0  Down, Depressed, Hopeless 0 0 0  PHQ - 2 Score 1 0 0  Altered sleeping 2 - -  Tired, decreased energy 1 - -  Change in appetite 0 - -  Feeling bad or failure about yourself  0 - -  Trouble concentrating 0 - -  Moving slowly or fidgety/restless 0 - -  Suicidal thoughts 0 - -  PHQ-9 Score 4 - -  Difficult doing work/chores Not difficult at all - -  Some recent data might be hidden    History Luke Davila has a past medical history of Avascular necrosis of hip ([place]), Enlarged prostate, Glaucoma, Gout, and History of gout.   Luke Davila has a past surgical history that includes Foot surgery (Left, 1985); Left hand surgery; Total hip arthroplasty (Left, 04/09/2015); and Colonoscopy with propofol (N/A, 11/27/2015).   His family history includes Hypertension in his mother.Luke Davila reports that Luke Davila has never smoked. Luke Davila has never used smokeless tobacco. Luke Davila reports current alcohol use. Luke Davila reports that Luke Davila does not use drugs.    ROS Review of Systems  Constitutional:  Negative for fever.  Respiratory:  Negative for shortness of breath.   Cardiovascular:  Negative for chest pain.  Musculoskeletal:  Negative for arthralgias.  Skin:  Negative for rash.  Neurological:  Positive for dizziness (see E.D. report & HPI).   Objective:  BP  135/84   Pulse 99   Temp (!) 96.7 F (35.9 C)   Ht _0  (1.905 m)   Wt 257 lb 12.8 oz (116.9 kg)   SpO2 97%   BMI 32.22 kg/m   BP Readings from Last 3 Encounters:  11/01/20 132/72  10/18/20 135/84  10/16/20 130/89    Wt Readings from Last 3 Encounters:  11/01/20 260 lb (117.9 kg)  10/18/20 257 lb 12.8 oz (116.9 kg)  10/16/20 258 lb (117 kg)     Physical Exam Vitals reviewed.  Constitutional:      Appearance: Luke Davila is well-developed.  HENT:     Head: Normocephalic and atraumatic.     Right Ear: External ear normal.     Left Ear: External ear normal.     Mouth/Throat:     Pharynx: No oropharyngeal exudate or posterior oropharyngeal erythema.  Eyes:     Pupils: Pupils are equal, round, and reactive to light.  Cardiovascular:     Rate and Rhythm: Normal rate and regular rhythm.     Heart sounds: No murmur heard. Pulmonary:     Effort: No respiratory distress.     Breath sounds: Normal breath sounds.  Genitourinary:    Comments: Leg bag in place for foley Musculoskeletal:     Cervical back: Normal range of motion and neck supple.  Neurological:  Mental Status: Luke Davila is alert and oriented to person, place, and time.      Assessment & Plan:   Luke Davila was seen today for er follow up.  Diagnoses and all orders for this visit:  Obstructive uropathy -     CMP14+EGFR      I have discontinued Luke Davila. Luke "Denard"'s amLODipine. I am also having him maintain his augmented betamethasone dipropionate, albuterol, tamsulosin, testosterone cypionate, and solifenacin. We will continue to administer testosterone cypionate.  Allergies as of 10/18/2020   No Known Allergies      Medication List        Accurate as of October 18, 2020 11:59 PM. If you have any questions, ask your nurse or doctor.          STOP taking these medications    amLODipine 5 MG tablet Commonly known as: NORVASC Stopped by: Claretta Fraise, MD       TAKE these medications     albuterol 108 (90 Base) MCG/ACT inhaler Commonly known as: VENTOLIN HFA Inhale 2 puffs into the lungs every 6 (six) hours as needed for wheezing or shortness of breath.   augmented betamethasone dipropionate 0.05 % cream Commonly known as: DIPROLENE-AF Apply topically 2 (two) times daily. At affected areas (avoid face and genitals)   solifenacin 10 MG tablet Commonly known as: VESICARE Take 1 tablet (10 mg total) by mouth daily.   tamsulosin 0.4 MG Caps capsule Commonly known as: FLOMAX TAKE 2 CAPSULES (0.8 MG TOTAL) BY MOUTH DAILY AFTER SUPPER.   testosterone cypionate 200 MG/ML injection Commonly known as: DEPOTESTOSTERONE CYPIONATE INJECT 1 ML (200 MG TOTAL) INTO THE MUSCLE EVERY 14 (FOURTEEN) DAYS.         Follow-up: Return in about 2 weeks (around 11/01/2020).  Claretta Fraise, M.D.

## 2020-10-19 ENCOUNTER — Other Ambulatory Visit: Payer: Self-pay | Admitting: *Deleted

## 2020-10-19 DIAGNOSIS — N289 Disorder of kidney and ureter, unspecified: Secondary | ICD-10-CM

## 2020-10-19 LAB — CMP14+EGFR
ALT: 12 IU/L (ref 0–44)
AST: 23 IU/L (ref 0–40)
Albumin/Globulin Ratio: 1.5 (ref 1.2–2.2)
Albumin: 4.3 g/dL (ref 3.8–4.9)
Alkaline Phosphatase: 110 IU/L (ref 44–121)
BUN/Creatinine Ratio: 16 (ref 9–20)
BUN: 32 mg/dL — ABNORMAL HIGH (ref 6–24)
Bilirubin Total: 0.4 mg/dL (ref 0.0–1.2)
CO2: 20 mmol/L (ref 20–29)
Calcium: 8.9 mg/dL (ref 8.7–10.2)
Chloride: 100 mmol/L (ref 96–106)
Creatinine, Ser: 2.05 mg/dL — ABNORMAL HIGH (ref 0.76–1.27)
Globulin, Total: 2.8 g/dL (ref 1.5–4.5)
Glucose: 91 mg/dL (ref 70–99)
Potassium: 5.2 mmol/L (ref 3.5–5.2)
Sodium: 136 mmol/L (ref 134–144)
Total Protein: 7.1 g/dL (ref 6.0–8.5)
eGFR: 38 mL/min/{1.73_m2} — ABNORMAL LOW (ref >59)

## 2020-10-29 ENCOUNTER — Ambulatory Visit (INDEPENDENT_AMBULATORY_CARE_PROVIDER_SITE_OTHER): Payer: 59

## 2020-10-29 ENCOUNTER — Other Ambulatory Visit: Payer: Self-pay

## 2020-10-29 DIAGNOSIS — N289 Disorder of kidney and ureter, unspecified: Secondary | ICD-10-CM

## 2020-10-29 DIAGNOSIS — E291 Testicular hypofunction: Secondary | ICD-10-CM

## 2020-10-29 DIAGNOSIS — E349 Endocrine disorder, unspecified: Secondary | ICD-10-CM

## 2020-10-29 NOTE — Progress Notes (Signed)
Testosterone injection given to left upper outer quadrant.  Patient tolerated well. 

## 2020-10-30 LAB — CMP14+EGFR
ALT: 11 IU/L (ref 0–44)
AST: 22 IU/L (ref 0–40)
Albumin/Globulin Ratio: 1.5 (ref 1.2–2.2)
Albumin: 4.3 g/dL (ref 3.8–4.9)
Alkaline Phosphatase: 136 IU/L — ABNORMAL HIGH (ref 44–121)
BUN/Creatinine Ratio: 8 — ABNORMAL LOW (ref 9–20)
BUN: 16 mg/dL (ref 6–24)
Bilirubin Total: 0.5 mg/dL (ref 0.0–1.2)
CO2: 24 mmol/L (ref 20–29)
Calcium: 9.8 mg/dL (ref 8.7–10.2)
Chloride: 104 mmol/L (ref 96–106)
Creatinine, Ser: 2.13 mg/dL — ABNORMAL HIGH (ref 0.76–1.27)
Globulin, Total: 2.9 g/dL (ref 1.5–4.5)
Glucose: 75 mg/dL (ref 70–99)
Potassium: 4.1 mmol/L (ref 3.5–5.2)
Sodium: 141 mmol/L (ref 134–144)
Total Protein: 7.2 g/dL (ref 6.0–8.5)
eGFR: 36 mL/min/{1.73_m2} — ABNORMAL LOW (ref >59)

## 2020-11-01 ENCOUNTER — Ambulatory Visit (INDEPENDENT_AMBULATORY_CARE_PROVIDER_SITE_OTHER): Payer: 59 | Admitting: Family Medicine

## 2020-11-01 ENCOUNTER — Encounter: Payer: Self-pay | Admitting: Family Medicine

## 2020-11-01 ENCOUNTER — Other Ambulatory Visit: Payer: Self-pay

## 2020-11-01 VITALS — BP 132/72 | HR 113 | Temp 97.0°F | Ht 75.0 in | Wt 260.0 lb

## 2020-11-01 DIAGNOSIS — N401 Enlarged prostate with lower urinary tract symptoms: Secondary | ICD-10-CM

## 2020-11-01 DIAGNOSIS — R338 Other retention of urine: Secondary | ICD-10-CM

## 2020-11-01 DIAGNOSIS — N289 Disorder of kidney and ureter, unspecified: Secondary | ICD-10-CM

## 2020-11-01 NOTE — Progress Notes (Signed)
Subjective:  Patient ID: Luke Davila, male    DOB: 12/31/1965  Age: 55 y.o. MRN: [id number]  CC: Follow-up   HPI Luke Davila presents for had orthostatic episode after catheter removal earlier today. when he got out of his vehicle at a store. Hadn't used the bathroom yet at that time. Since then he has urinated. Sx have resolved. He continues to have elevated creatinine. He is here today for recheck of renal function.    Depression screen [facility] 2/9 11/01/2020 11/01/2020 10/18/2020  Decreased Interest 1 0 0  Down, Depressed, Hopeless 0 0 0  PHQ - 2 Score 1 0 0  Altered sleeping 2 - -  Tired, decreased energy 1 - -  Change in appetite 0 - -  Feeling bad or failure about yourself  0 - -  Trouble concentrating 0 - -  Moving slowly or fidgety/restless 0 - -  Suicidal thoughts 0 - -  PHQ-9 Score 4 - -  Difficult doing work/chores Not difficult at all - -  Some recent data might be hidden    History Luke Davila has a past medical history of Avascular necrosis of hip ([place]), Enlarged prostate, Glaucoma, Gout, and History of gout.   He has a past surgical history that includes Foot surgery (Left, 1985); Left hand surgery; Total hip arthroplasty (Left, 04/09/2015); and Colonoscopy with propofol (N/A, 11/27/2015).   His family history includes Hypertension in his mother.He reports that he has never smoked. He has never used smokeless tobacco. He reports current alcohol use. He reports that he does not use drugs.    ROS Review of Systems  Constitutional:  Negative for fever.  Respiratory:  Negative for shortness of breath.   Cardiovascular:  Negative for chest pain.  Musculoskeletal:  Negative for arthralgias.  Skin:  Negative for rash.   Objective:  BP 132/72   Pulse (!) 113   Temp (!) 97 F (36.1 C)   Ht '6\' 3"'  (1.905 m)   Wt 260 lb (117.9 kg)   SpO2 97%   BMI 32.50 kg/m   BP Readings from Last 3 Encounters:  11/01/20 132/72  10/18/20 135/84  10/16/20 130/89    Wt  Readings from Last 3 Encounters:  11/01/20 260 lb (117.9 kg)  10/18/20 257 lb 12.8 oz (116.9 kg)  10/16/20 258 lb (117 kg)     Physical Exam Vitals reviewed.  Constitutional:      Appearance: He is well-developed.  HENT:     Head: Normocephalic and atraumatic.     Right Ear: External ear normal.     Left Ear: External ear normal.     Mouth/Throat:     Pharynx: No oropharyngeal exudate or posterior oropharyngeal erythema.  Eyes:     Pupils: Pupils are equal, round, and reactive to light.  Cardiovascular:     Rate and Rhythm: Normal rate and regular rhythm.     Heart sounds: No murmur heard. Pulmonary:     Effort: No respiratory distress.     Breath sounds: Normal breath sounds.  Musculoskeletal:     Cervical back: Normal range of motion and neck supple.  Neurological:     Mental Status: He is alert and oriented to person, place, and time.      Assessment & Plan:   Luke Davila was seen today for follow-up.  Diagnoses and all orders for this visit:  Benign prostatic hyperplasia with urinary retention -     CMP14+EGFR  Renal insufficiency  I am having Luke Davila. Luke "Denard" maintain his augmented betamethasone dipropionate, albuterol, tamsulosin, testosterone cypionate, and solifenacin. We will continue to administer testosterone cypionate.  Allergies as of 11/01/2020   No Known Allergies      Medication List        Accurate as of November 01, 2020 11:59 PM. If you have any questions, ask your nurse or doctor.          albuterol 108 (90 Base) MCG/ACT inhaler Commonly known as: VENTOLIN HFA Inhale 2 puffs into the lungs every 6 (six) hours as needed for wheezing or shortness of breath.   augmented betamethasone dipropionate 0.05 % cream Commonly known as: DIPROLENE-AF Apply topically 2 (two) times daily. At affected areas (avoid face and genitals)   solifenacin 10 MG tablet Commonly known as: VESICARE Take 1 tablet (10 mg total) by mouth daily.    tamsulosin 0.4 MG Caps capsule Commonly known as: FLOMAX TAKE 2 CAPSULES (0.8 MG TOTAL) BY MOUTH DAILY AFTER SUPPER.   testosterone cypionate 200 MG/ML injection Commonly known as: DEPOTESTOSTERONE CYPIONATE INJECT 1 ML (200 MG TOTAL) INTO THE MUSCLE EVERY 14 (FOURTEEN) DAYS.         Follow-up: Return in about 1 month (around 12/02/2020).  Claretta Fraise, M.D.

## 2020-11-02 LAB — CMP14+EGFR
ALT: 10 IU/L (ref 0–44)
AST: 22 IU/L (ref 0–40)
Albumin/Globulin Ratio: 1.6 (ref 1.2–2.2)
Albumin: 4.3 g/dL (ref 3.8–4.9)
Alkaline Phosphatase: 132 IU/L — ABNORMAL HIGH (ref 44–121)
BUN/Creatinine Ratio: 7 — ABNORMAL LOW (ref 9–20)
BUN: 16 mg/dL (ref 6–24)
Bilirubin Total: 0.7 mg/dL (ref 0.0–1.2)
CO2: 23 mmol/L (ref 20–29)
Calcium: 9.4 mg/dL (ref 8.7–10.2)
Chloride: 101 mmol/L (ref 96–106)
Creatinine, Ser: 2.15 mg/dL — ABNORMAL HIGH (ref 0.76–1.27)
Globulin, Total: 2.7 g/dL (ref 1.5–4.5)
Glucose: 85 mg/dL (ref 70–99)
Potassium: 4.2 mmol/L (ref 3.5–5.2)
Sodium: 138 mmol/L (ref 134–144)
Total Protein: 7 g/dL (ref 6.0–8.5)
eGFR: 35 mL/min/{1.73_m2} — ABNORMAL LOW (ref >59)

## 2020-11-09 ENCOUNTER — Telehealth: Payer: Self-pay | Admitting: Family Medicine

## 2020-11-09 NOTE — Telephone Encounter (Signed)
Patient states he see's his kidney doctor on 11/14.  He states that Dr Darlyn Read always checks his kidney function before he goes to see him.

## 2020-11-09 NOTE — Telephone Encounter (Signed)
It appears from Dr. Darlyn Read message on the results he wanted to make sure patient was going to be seeing a kidney doctor soon. Can we please verify this?

## 2020-11-14 DIAGNOSIS — S52501A Unspecified fracture of the lower end of right radius, initial encounter for closed fracture: Secondary | ICD-10-CM | POA: Insufficient documentation

## 2020-11-14 DIAGNOSIS — S62336A Displaced fracture of neck of fifth metacarpal bone, right hand, initial encounter for closed fracture: Secondary | ICD-10-CM | POA: Insufficient documentation

## 2020-11-17 DIAGNOSIS — I10 Essential (primary) hypertension: Secondary | ICD-10-CM | POA: Insufficient documentation

## 2020-11-17 DIAGNOSIS — N182 Chronic kidney disease, stage 2 (mild): Secondary | ICD-10-CM | POA: Insufficient documentation

## 2020-11-27 ENCOUNTER — Encounter: Payer: Self-pay | Admitting: Family Medicine

## 2020-11-27 ENCOUNTER — Other Ambulatory Visit: Payer: Self-pay

## 2020-11-27 ENCOUNTER — Ambulatory Visit (INDEPENDENT_AMBULATORY_CARE_PROVIDER_SITE_OTHER): Payer: 59 | Admitting: Family Medicine

## 2020-11-27 DIAGNOSIS — Z87440 Personal history of urinary (tract) infections: Secondary | ICD-10-CM

## 2020-11-27 DIAGNOSIS — S2224XD Fracture of xiphoid process, subsequent encounter for fracture with routine healing: Secondary | ICD-10-CM | POA: Diagnosis not present

## 2020-11-27 DIAGNOSIS — S2243XD Multiple fractures of ribs, bilateral, subsequent encounter for fracture with routine healing: Secondary | ICD-10-CM | POA: Diagnosis not present

## 2020-11-27 DIAGNOSIS — Z09 Encounter for follow-up examination after completed treatment for conditions other than malignant neoplasm: Secondary | ICD-10-CM

## 2020-11-27 DIAGNOSIS — I6521 Occlusion and stenosis of right carotid artery: Secondary | ICD-10-CM

## 2020-11-27 DIAGNOSIS — S301XXA Contusion of abdominal wall, initial encounter: Secondary | ICD-10-CM

## 2020-11-27 DIAGNOSIS — N179 Acute kidney failure, unspecified: Secondary | ICD-10-CM

## 2020-11-27 DIAGNOSIS — J8410 Pulmonary fibrosis, unspecified: Secondary | ICD-10-CM

## 2020-11-27 DIAGNOSIS — D860 Sarcoidosis of lung: Secondary | ICD-10-CM

## 2020-11-27 DIAGNOSIS — Z978 Presence of other specified devices: Secondary | ICD-10-CM

## 2020-11-27 DIAGNOSIS — S52501D Unspecified fracture of the lower end of right radius, subsequent encounter for closed fracture with routine healing: Secondary | ICD-10-CM

## 2020-11-27 DIAGNOSIS — I700 Atherosclerosis of aorta: Secondary | ICD-10-CM

## 2020-11-27 LAB — URINALYSIS, ROUTINE W REFLEX MICROSCOPIC
Bilirubin, UA: NEGATIVE
Glucose, UA: NEGATIVE
Ketones, UA: NEGATIVE
Leukocytes,UA: NEGATIVE
Nitrite, UA: NEGATIVE
Specific Gravity, UA: 1.02 (ref 1.005–1.030)
Urobilinogen, Ur: 0.2 mg/dL (ref 0.2–1.0)
pH, UA: 7.5 (ref 5.0–7.5)

## 2020-11-27 LAB — CBC WITH DIFFERENTIAL/PLATELET
Basophils Absolute: 0.1 10*3/uL (ref 0.0–0.2)
Basos: 1 %
EOS (ABSOLUTE): 0.5 10*3/uL — ABNORMAL HIGH (ref 0.0–0.4)
Eos: 6 %
Hematocrit: 28.1 % — ABNORMAL LOW (ref 37.5–51.0)
Hemoglobin: 9 g/dL — ABNORMAL LOW (ref 13.0–17.7)
Immature Grans (Abs): 0.1 10*3/uL (ref 0.0–0.1)
Immature Granulocytes: 1 %
Lymphocytes Absolute: 1.5 10*3/uL (ref 0.7–3.1)
Lymphs: 17 %
MCH: 25.2 pg — ABNORMAL LOW (ref 26.6–33.0)
MCHC: 32 g/dL (ref 31.5–35.7)
MCV: 79 fL (ref 79–97)
Monocytes Absolute: 0.7 10*3/uL (ref 0.1–0.9)
Monocytes: 8 %
Neutrophils Absolute: 5.8 10*3/uL (ref 1.4–7.0)
Neutrophils: 67 %
Platelets: 366 10*3/uL (ref 150–450)
RBC: 3.57 x10E6/uL — ABNORMAL LOW (ref 4.14–5.80)
RDW: 13 % (ref 11.6–15.4)
WBC: 8.8 10*3/uL (ref 3.4–10.8)

## 2020-11-27 LAB — LIPID PANEL
Chol/HDL Ratio: 7.5 ratio — ABNORMAL HIGH (ref 0.0–5.0)
Cholesterol, Total: 278 mg/dL — ABNORMAL HIGH (ref 100–199)
HDL: 37 mg/dL — ABNORMAL LOW (ref >39)
LDL Chol Calc (NIH): 188 mg/dL — ABNORMAL HIGH (ref 0–99)
Triglycerides: 273 mg/dL — ABNORMAL HIGH (ref 0–149)
VLDL Cholesterol Cal: 53 mg/dL — ABNORMAL HIGH (ref 5–40)

## 2020-11-27 LAB — MICROSCOPIC EXAMINATION
Bacteria, UA: NONE SEEN
Epithelial Cells (non renal): NONE SEEN /hpf (ref 0–10)
Renal Epithel, UA: NONE SEEN /hpf

## 2020-11-27 LAB — CMP14+EGFR
ALT: 44 IU/L (ref 0–44)
AST: 94 IU/L — ABNORMAL HIGH (ref 0–40)
Albumin/Globulin Ratio: 1.2 (ref 1.2–2.2)
Albumin: 4 g/dL (ref 3.8–4.9)
Alkaline Phosphatase: 567 IU/L — ABNORMAL HIGH (ref 44–121)
BUN/Creatinine Ratio: 8 — ABNORMAL LOW (ref 9–20)
BUN: 12 mg/dL (ref 6–24)
Bilirubin Total: 0.4 mg/dL (ref 0.0–1.2)
CO2: 23 mmol/L (ref 20–29)
Calcium: 9.3 mg/dL (ref 8.7–10.2)
Chloride: 99 mmol/L (ref 96–106)
Creatinine, Ser: 1.56 mg/dL — ABNORMAL HIGH (ref 0.76–1.27)
Globulin, Total: 3.3 g/dL (ref 1.5–4.5)
Glucose: 82 mg/dL (ref 70–99)
Potassium: 4.7 mmol/L (ref 3.5–5.2)
Sodium: 135 mmol/L (ref 134–144)
Total Protein: 7.3 g/dL (ref 6.0–8.5)
eGFR: 52 mL/min/{1.73_m2} — ABNORMAL LOW (ref >59)

## 2020-11-27 MED ORDER — ALBUTEROL SULFATE HFA 108 (90 BASE) MCG/ACT IN AERS: 2.0000 | INHALATION_SPRAY | Freq: Four times a day (QID) | RESPIRATORY_TRACT | 11 refills | Status: DC | PRN

## 2020-11-27 NOTE — Patient Instructions (Signed)
Indwelling Urinary Catheter Care, Adult °An indwelling urinary catheter is a thin, flexible, germ-free (sterile) tube that is placed into the bladder to help drain urine out of the body. The catheter is inserted into the part of the body that drains urine from the bladder (urethra). Urine drains from the catheter into a drainage bag outside of the body. °Taking good care of your catheter will keep it working properly and help to prevent problems from developing. °What are the risks? °Bacteria may get into your bladder and cause a urinary tract infection. °Urine flow can become blocked. This can happen if the catheter is not working correctly, or if you have sediment or a blood clot in your bladder or the catheter. °Tissue near the catheter may become irritated and bleed. °How to wear your catheter and your drainage bag °Supplies needed °Adhesive tape or a leg strap. °Alcohol wipe or soap and water (if you use tape). °A clean towel (if you use tape). °Overnight drainage bag. °Smaller drainage bag (leg bag). °Wearing your catheter and bag °Use adhesive tape or a leg strap to attach your catheter to your leg. °Make sure the catheter is not pulled tight. °If a leg strap gets wet, replace it with a dry one. °If you use adhesive tape: °Use an alcohol wipe or soap and water to wash off any stickiness on your skin where you had tape before. °Use a clean towel to pat-dry the area. °Apply the new tape. °You should have received a large overnight drainage bag and a smaller leg bag that fits underneath clothing. °You may wear the overnight bag at any time, but you should not wear the leg bag at night. °Always wear the leg bag below your knee. °Make sure the overnight drainage bag is always lower than the level of your bladder, but do not let it touch the floor. Before you go to sleep, hang the bag inside a wastebasket that is covered by a clean plastic bag. °How to care for your skin around the catheter °  °Supplies needed °A  clean washcloth. °Water and mild soap. °A clean towel. °Caring for your skin and catheter °Every day, use a clean washcloth and soapy water to clean the skin around your catheter. °Wash your hands with soap and water. °Wet a washcloth in warm water and mild soap. °Clean the skin around your urethra. °If you are male: °Use one hand to gently spread the folds of skin around your vagina (labia). °With the washcloth in your other hand, wipe the inner side of your labia on each side. Do this in a front-to-back direction. °If you are male: °Use one hand to pull back any skin that covers the end of your penis (foreskin). °With the washcloth in your other hand, wipe your penis in small circles. Start wiping at the tip of your penis, then move outward from the catheter. °Move the foreskin back in place, if this applies. °With your free hand, hold the catheter close to where it enters your body. Keep holding the catheter during cleaning so it does not get pulled out. °Use your other hand to clean the catheter with the washcloth. °Only wipe downward on the catheter, toward the bag. °Do not wipe upward toward your body, because that may push bacteria into your urethra and cause infection. °Use a clean towel to pat-dry the catheter and the skin around it. Make sure to wipe off all soap. °Wash your hands with soap and water. °Shower every day. Do   not take baths. °Do not use cream, ointment, or lotion on the area where the catheter enters your body, unless your health care provider tells you to do that. °Do not use powders, sprays, or lotions on your genital area. °Check your skin around the catheter every day for signs of infection. Check for: °Redness, swelling, or pain. °Fluid or blood. °Warmth. °Pus or a bad smell. °How to empty the drainage bag °Supplies needed °Rubbing alcohol. °Gauze pad or cotton ball. °Adhesive tape or a leg strap. °Emptying the bag °Empty your drainage bag (your overnight drainage bag or your leg bag)  when it is ?-½ full, or at least 2-3 times a day. Clean the drainage bag according to the manufacturer's instructions or as told by your health care provider. °Wash your hands with soap and water. °Detach the drainage bag from your leg. °Hold the drainage bag over the toilet or a clean container. Make sure the drainage bag is lower than your hips and bladder. This stops urine from going back into the tubing and into your bladder. °Open the pour spout at the bottom of the bag. °Empty the urine into the toilet or container. Do not let the pour spout touch any surface. This precaution is important to prevent bacteria from getting in the bag and causing infection. °Apply rubbing alcohol to a gauze pad or cotton ball. °Use the gauze pad or cotton ball to clean the pour spout. °Close the pour spout. °Attach the bag to your leg with adhesive tape or a leg strap. °Wash your hands with soap and water. °How to change the drainage bag °Supplies needed: °Alcohol wipes. °A clean drainage bag. °Adhesive tape or a leg strap. °Changing the bag °Replace your drainage bag with a clean bag if it leaks, starts to smell bad, or looks dirty. °Wash your hands with soap and water. °Detach the dirty drainage bag from your leg. °Pinch the catheter with your fingers so that urine does not spill out. °Disconnect the catheter tube from the drainage tube at the connection valve. Do not let the tubes touch any surface. °Clean the end of the catheter tube with an alcohol wipe. Use a different alcohol wipe to clean the end of the drainage tube. °Connect the catheter tube to the drainage tube of the clean bag. °Attach the clean bag to your leg with adhesive tape or a leg strap. Avoid attaching the new bag too tightly. °Wash your hands with soap and water. °General instructions ° °Never pull on your catheter or try to remove it. Pulling can damage your internal tissues. °Always wash your hands before and after you handle your catheter or drainage  bag. Use a mild, fragrance-free soap. If soap and water are not available, use hand sanitizer. °Always make sure there are no twists or bends (kinks) in the catheter tube. °Always make sure there are no leaks in the catheter or drainage bag. °Drink enough fluid to keep your urine pale yellow. °Do not take baths, swim, or use a hot tub. °If you are male, wipe from front to back after having a bowel movement. °Contact a health care provider if: °Your urine is cloudy. °Your urine smells unusually bad. °Your catheter gets clogged. °Your catheter starts to leak. °Your bladder feels full. °Get help right away if: °You have redness, swelling, or pain where the catheter enters your body. °You have fluid, blood, pus, or a bad smell coming from the area where the catheter enters your body. °The area where   the catheter enters your body feels warm to the touch. °You have a fever. °You have pain in your abdomen, legs, lower back, or bladder. °You see blood in the catheter. °Your urine is pink or red. °You have nausea, vomiting, or chills. °Your urine is not draining into the bag. °Your catheter gets pulled out. °Summary °An indwelling urinary catheter is a thin, flexible, germ-free (sterile) tube that is placed into the bladder to help drain urine out of the body. °The catheter is inserted into the part of the body that drains urine from the bladder (urethra). °Take good care of your catheter to keep it working properly and help prevent problems from developing. °Always wash your hands before and after you handle your catheter or drainage bag. °Never pull on your catheter or try to remove it. °This information is not intended to replace advice given to you by your health care provider. Make sure you discuss any questions you have with your health care provider. °Document Revised: 03/14/2020 Document Reviewed: 12/09/2019 °Elsevier Patient Education © 2022 [employer] ° °
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
## 2020-11-28 ENCOUNTER — Encounter: Payer: Self-pay | Admitting: Family Medicine

## 2020-11-28 ENCOUNTER — Other Ambulatory Visit: Payer: Self-pay | Admitting: Family Medicine

## 2020-11-29 LAB — GAMMA GT: GGT: 545 IU/L — ABNORMAL HIGH (ref 0–65)

## 2020-11-29 LAB — SPECIMEN STATUS REPORT

## 2020-11-30 LAB — URINE CULTURE: Organism ID, Bacteria: NO GROWTH

## 2020-12-03 ENCOUNTER — Other Ambulatory Visit: Payer: Self-pay | Admitting: Family Medicine

## 2020-12-03 ENCOUNTER — Other Ambulatory Visit: Payer: Self-pay | Admitting: *Deleted

## 2020-12-03 DIAGNOSIS — E291 Testicular hypofunction: Secondary | ICD-10-CM

## 2020-12-03 DIAGNOSIS — R748 Abnormal levels of other serum enzymes: Secondary | ICD-10-CM

## 2020-12-10 ENCOUNTER — Encounter: Payer: Self-pay | Admitting: Family Medicine

## 2020-12-10 ENCOUNTER — Other Ambulatory Visit: Payer: Self-pay | Admitting: Family Medicine

## 2020-12-10 DIAGNOSIS — Z87440 Personal history of urinary (tract) infections: Secondary | ICD-10-CM

## 2020-12-10 DIAGNOSIS — N179 Acute kidney failure, unspecified: Secondary | ICD-10-CM

## 2020-12-10 DIAGNOSIS — Z978 Presence of other specified devices: Secondary | ICD-10-CM

## 2020-12-12 ENCOUNTER — Other Ambulatory Visit: Payer: 59

## 2020-12-12 DIAGNOSIS — Z978 Presence of other specified devices: Secondary | ICD-10-CM

## 2020-12-12 DIAGNOSIS — Z87440 Personal history of urinary (tract) infections: Secondary | ICD-10-CM

## 2020-12-12 DIAGNOSIS — R748 Abnormal levels of other serum enzymes: Secondary | ICD-10-CM

## 2020-12-12 LAB — MICROSCOPIC EXAMINATION: Renal Epithel, UA: NONE SEEN /hpf

## 2020-12-12 LAB — URINALYSIS, ROUTINE W REFLEX MICROSCOPIC
Bilirubin, UA: NEGATIVE
Glucose, UA: NEGATIVE
Ketones, UA: NEGATIVE
Nitrite, UA: NEGATIVE
Specific Gravity, UA: 1.02 (ref 1.005–1.030)
Urobilinogen, Ur: 0.2 mg/dL (ref 0.2–1.0)
pH, UA: 7.5 (ref 5.0–7.5)

## 2020-12-13 ENCOUNTER — Other Ambulatory Visit: Payer: Self-pay | Admitting: Family Medicine

## 2020-12-13 LAB — HEPATIC FUNCTION PANEL
ALT: 16 IU/L (ref 0–44)
AST: 32 IU/L (ref 0–40)
Albumin: 4.1 g/dL (ref 3.8–4.9)
Alkaline Phosphatase: 283 IU/L — ABNORMAL HIGH (ref 44–121)
Bilirubin Total: 0.6 mg/dL (ref 0.0–1.2)
Bilirubin, Direct: 0.14 mg/dL (ref 0.00–0.40)
Total Protein: 7.2 g/dL (ref 6.0–8.5)

## 2020-12-14 ENCOUNTER — Ambulatory Visit: Payer: 59

## 2020-12-18 NOTE — Telephone Encounter (Signed)
Yes, please. WS 

## 2020-12-19 LAB — URINE CULTURE

## 2020-12-20 NOTE — Addendum Note (Signed)
Addended by: Quay Burow on: 12/20/2020 11:36 AM   Modules accepted: Orders

## 2020-12-25 ENCOUNTER — Other Ambulatory Visit: Payer: 59

## 2020-12-25 DIAGNOSIS — N179 Acute kidney failure, unspecified: Secondary | ICD-10-CM

## 2020-12-26 LAB — CMP14+EGFR
ALT: 13 IU/L (ref 0–44)
AST: 29 IU/L (ref 0–40)
Albumin/Globulin Ratio: 1.4 (ref 1.2–2.2)
Albumin: 4.3 g/dL (ref 3.8–4.9)
Alkaline Phosphatase: 202 IU/L — ABNORMAL HIGH (ref 44–121)
BUN/Creatinine Ratio: 12 (ref 9–20)
BUN: 17 mg/dL (ref 6–24)
Bilirubin Total: 0.4 mg/dL (ref 0.0–1.2)
CO2: 20 mmol/L (ref 20–29)
Calcium: 9.4 mg/dL (ref 8.7–10.2)
Chloride: 100 mmol/L (ref 96–106)
Creatinine, Ser: 1.42 mg/dL — ABNORMAL HIGH (ref 0.76–1.27)
Globulin, Total: 3 g/dL (ref 1.5–4.5)
Glucose: 80 mg/dL (ref 70–99)
Potassium: 4.3 mmol/L (ref 3.5–5.2)
Sodium: 137 mmol/L (ref 134–144)
Total Protein: 7.3 g/dL (ref 6.0–8.5)
eGFR: 58 mL/min/{1.73_m2} — ABNORMAL LOW (ref >59)

## 2020-12-27 NOTE — Addendum Note (Signed)
Addended by: Quay Burow on: 12/27/2020 03:37 PM   Modules accepted: Orders

## 2020-12-28 ENCOUNTER — Other Ambulatory Visit: Payer: Self-pay | Admitting: Family Medicine

## 2020-12-28 DIAGNOSIS — R351 Nocturia: Secondary | ICD-10-CM

## 2021-01-01 ENCOUNTER — Other Ambulatory Visit: Payer: 59

## 2021-01-01 DIAGNOSIS — N179 Acute kidney failure, unspecified: Secondary | ICD-10-CM

## 2021-01-02 ENCOUNTER — Other Ambulatory Visit: Payer: 59

## 2021-01-02 DIAGNOSIS — N179 Acute kidney failure, unspecified: Secondary | ICD-10-CM

## 2021-01-02 LAB — MICROSCOPIC EXAMINATION
Bacteria, UA: NONE SEEN
RBC, Urine: NONE SEEN /hpf (ref 0–2)
Renal Epithel, UA: NONE SEEN /hpf

## 2021-01-02 LAB — URINALYSIS, COMPLETE
Bilirubin, UA: NEGATIVE
Glucose, UA: NEGATIVE
Ketones, UA: NEGATIVE
Leukocytes,UA: NEGATIVE
Nitrite, UA: NEGATIVE
Protein,UA: NEGATIVE
RBC, UA: NEGATIVE
Specific Gravity, UA: 1.02 (ref 1.005–1.030)
Urobilinogen, Ur: 0.2 mg/dL (ref 0.2–1.0)
pH, UA: 5.5 (ref 5.0–7.5)

## 2021-01-02 LAB — CMP14+EGFR
ALT: 24 IU/L (ref 0–44)
AST: 46 IU/L — ABNORMAL HIGH (ref 0–40)
Albumin/Globulin Ratio: 1.4 (ref 1.2–2.2)
Albumin: 4.3 g/dL (ref 3.8–4.9)
Alkaline Phosphatase: 206 IU/L — ABNORMAL HIGH (ref 44–121)
BUN/Creatinine Ratio: 7 — ABNORMAL LOW (ref 9–20)
BUN: 9 mg/dL (ref 6–24)
Bilirubin Total: 0.4 mg/dL (ref 0.0–1.2)
CO2: 23 mmol/L (ref 20–29)
Calcium: 9.4 mg/dL (ref 8.7–10.2)
Chloride: 103 mmol/L (ref 96–106)
Creatinine, Ser: 1.33 mg/dL — ABNORMAL HIGH (ref 0.76–1.27)
Globulin, Total: 3.1 g/dL (ref 1.5–4.5)
Glucose: 71 mg/dL (ref 70–99)
Potassium: 4.1 mmol/L (ref 3.5–5.2)
Sodium: 139 mmol/L (ref 134–144)
Total Protein: 7.4 g/dL (ref 6.0–8.5)
eGFR: 63 mL/min/{1.73_m2} (ref >59)

## 2021-01-04 LAB — URINE CULTURE: Organism ID, Bacteria: NO GROWTH

## 2021-01-21 ENCOUNTER — Other Ambulatory Visit: Payer: 59

## 2021-01-21 ENCOUNTER — Ambulatory Visit (INDEPENDENT_AMBULATORY_CARE_PROVIDER_SITE_OTHER): Payer: 59

## 2021-01-21 DIAGNOSIS — E291 Testicular hypofunction: Secondary | ICD-10-CM

## 2021-01-21 DIAGNOSIS — N179 Acute kidney failure, unspecified: Secondary | ICD-10-CM

## 2021-01-21 MED ORDER — TESTOSTERONE CYPIONATE 200 MG/ML IM SOLN
200.0000 mg | Freq: Once | INTRAMUSCULAR | Status: AC
  Administered 2021-01-21: 200 mg via INTRAMUSCULAR

## 2021-01-21 NOTE — Progress Notes (Signed)
Testosterone injection given to right upper outer quadrant.  Patient tolerated well. 

## 2021-01-22 LAB — CMP14+EGFR
ALT: 23 IU/L (ref 0–44)
AST: 44 IU/L — ABNORMAL HIGH (ref 0–40)
Albumin/Globulin Ratio: 1.3 (ref 1.2–2.2)
Albumin: 4.2 g/dL (ref 3.8–4.9)
Alkaline Phosphatase: 220 IU/L — ABNORMAL HIGH (ref 44–121)
BUN/Creatinine Ratio: 11 (ref 9–20)
BUN: 14 mg/dL (ref 6–24)
Bilirubin Total: 0.3 mg/dL (ref 0.0–1.2)
CO2: 21 mmol/L (ref 20–29)
Calcium: 9.6 mg/dL (ref 8.7–10.2)
Chloride: 104 mmol/L (ref 96–106)
Creatinine, Ser: 1.28 mg/dL — ABNORMAL HIGH (ref 0.76–1.27)
Globulin, Total: 3.2 g/dL (ref 1.5–4.5)
Glucose: 82 mg/dL (ref 70–99)
Potassium: 3.9 mmol/L (ref 3.5–5.2)
Sodium: 143 mmol/L (ref 134–144)
Total Protein: 7.4 g/dL (ref 6.0–8.5)
eGFR: 66 mL/min/{1.73_m2} (ref >59)

## 2021-02-04 ENCOUNTER — Telehealth: Payer: Self-pay | Admitting: Family Medicine

## 2021-02-04 ENCOUNTER — Other Ambulatory Visit: Payer: Self-pay | Admitting: Family Medicine

## 2021-02-04 DIAGNOSIS — N139 Obstructive and reflux uropathy, unspecified: Secondary | ICD-10-CM

## 2021-02-04 NOTE — Telephone Encounter (Signed)
I wrote a standing order. It covers as many as 10 rechecks of kidney function without having to request an order (for his convenience.)

## 2021-02-04 NOTE — Telephone Encounter (Signed)
Patient aware.

## 2021-02-05 ENCOUNTER — Other Ambulatory Visit: Payer: Self-pay | Admitting: Family Medicine

## 2021-02-05 ENCOUNTER — Telehealth: Payer: Self-pay | Admitting: Family Medicine

## 2021-02-05 ENCOUNTER — Other Ambulatory Visit: Payer: 59

## 2021-02-05 DIAGNOSIS — R300 Dysuria: Secondary | ICD-10-CM

## 2021-02-05 DIAGNOSIS — N139 Obstructive and reflux uropathy, unspecified: Secondary | ICD-10-CM

## 2021-02-05 NOTE — Telephone Encounter (Signed)
Done. Thanks, WS 

## 2021-02-05 NOTE — Telephone Encounter (Signed)
Pt said that he needs a urine added to lab order to check for infection. Please call back and advise.

## 2021-02-06 ENCOUNTER — Ambulatory Visit (INDEPENDENT_AMBULATORY_CARE_PROVIDER_SITE_OTHER): Payer: 59 | Admitting: *Deleted

## 2021-02-06 ENCOUNTER — Other Ambulatory Visit: Payer: Self-pay

## 2021-02-06 DIAGNOSIS — E291 Testicular hypofunction: Secondary | ICD-10-CM

## 2021-02-06 DIAGNOSIS — N179 Acute kidney failure, unspecified: Secondary | ICD-10-CM

## 2021-02-06 LAB — BMP8+EGFR
BUN/Creatinine Ratio: 7 — ABNORMAL LOW (ref 9–20)
BUN: 10 mg/dL (ref 6–24)
CO2: 25 mmol/L (ref 20–29)
Calcium: 9.6 mg/dL (ref 8.7–10.2)
Chloride: 103 mmol/L (ref 96–106)
Creatinine, Ser: 1.45 mg/dL — ABNORMAL HIGH (ref 0.76–1.27)
Glucose: 86 mg/dL (ref 70–99)
Potassium: 4.4 mmol/L (ref 3.5–5.2)
Sodium: 140 mmol/L (ref 134–144)
eGFR: 57 mL/min/{1.73_m2} — ABNORMAL LOW (ref >59)

## 2021-02-06 LAB — URINALYSIS
Bilirubin, UA: NEGATIVE
Glucose, UA: NEGATIVE
Ketones, UA: NEGATIVE
Leukocytes,UA: NEGATIVE
Nitrite, UA: NEGATIVE
Protein,UA: NEGATIVE
RBC, UA: NEGATIVE
Specific Gravity, UA: 1.015 (ref 1.005–1.030)
Urobilinogen, Ur: 0.2 mg/dL (ref 0.2–1.0)
pH, UA: 6 (ref 5.0–7.5)

## 2021-02-06 MED ORDER — TESTOSTERONE CYPIONATE 200 MG/ML IM SOLN
200.0000 mg | INTRAMUSCULAR | Status: DC
  Administered 2021-02-06 – 2022-12-10 (×9): 200 mg via INTRAMUSCULAR

## 2021-02-06 NOTE — Progress Notes (Signed)
Testosterone injection given and patient tolerated well.  

## 2021-02-07 ENCOUNTER — Encounter: Payer: Self-pay | Admitting: Family Medicine

## 2021-02-07 NOTE — Progress Notes (Signed)
Hello Carr,  Your lab result is normal and/or stable.Some minor variations that are not significant are commonly marked abnormal, but do not represent any medical problem for you.  Best regards, Anniece Bleiler, M.D.

## 2021-02-08 LAB — URINE CULTURE: Organism ID, Bacteria: NO GROWTH

## 2021-02-13 ENCOUNTER — Telehealth (INDEPENDENT_AMBULATORY_CARE_PROVIDER_SITE_OTHER): Payer: 59 | Admitting: Family

## 2021-02-13 ENCOUNTER — Encounter: Payer: Self-pay | Admitting: Family

## 2021-02-13 DIAGNOSIS — B9689 Other specified bacterial agents as the cause of diseases classified elsewhere: Secondary | ICD-10-CM

## 2021-02-13 DIAGNOSIS — J208 Acute bronchitis due to other specified organisms: Secondary | ICD-10-CM

## 2021-02-13 MED ORDER — AZITHROMYCIN 250 MG PO TABS: ORAL_TABLET | ORAL | 0 refills | Status: DC

## 2021-02-13 MED ORDER — BENZONATATE 200 MG PO CAPS: 200.0000 mg | ORAL_CAPSULE | Freq: Three times a day (TID) | ORAL | 1 refills | Status: DC | PRN

## 2021-02-13 MED ORDER — PREDNISONE 10 MG (21) PO TBPK: ORAL_TABLET | ORAL | 0 refills | Status: DC

## 2021-02-13 NOTE — Progress Notes (Signed)
Virtual Visit Consent   Luke Davila, you are scheduled for a virtual visit with a  provider today.     Just as with appointments in the office, your consent must be obtained to participate.  Your consent will be active for this visit and any virtual visit you may have with one of our providers in the next 365 days.     If you have a MyChart account, a copy of this consent can be sent to you electronically.  All virtual visits are billed to your insurance company just like a traditional visit in the office.    As this is a virtual visit, video technology does not allow for your provider to perform a traditional examination.  This may limit your provider's ability to fully assess your condition.  If your provider identifies any concerns that need to be evaluated in person or the need to arrange testing (such as labs, EKG, etc.), we will make arrangements to do so.     Although advances in technology are sophisticated, we cannot ensure that it will always work on either your end or our end.  If the connection with a video visit is poor, the visit may have to be switched to a telephone visit.  With either a video or telephone visit, we are not always able to ensure that we have a secure connection.     I need to obtain your verbal consent now.   Are you willing to proceed with your visit today?    Luke Davila has provided verbal consent on 02/13/2021 for a virtual visit (video or telephone).   Jannifer Rodney, FNP   Date: 02/13/2021 1:55 PM   Virtual Visit via Video Note   I, Jannifer Rodney, connected with  Luke Davila  ([id number], 1965/02/21) on 02/13/21 at  3:25 PM EST by a video-enabled telemedicine application and verified that I am speaking with the correct person using two identifiers.  Location: Patient: Virtual Visit Location Patient: Other: work Provider: Pharmacist, community: Home Office   I discussed the limitations of evaluation and management by  telemedicine and the availability of in person appointments. The patient expressed understanding and agreed to proceed.    History of Present Illness: Luke Davila is a 56 y.o. who identifies as a male who was assigned male at birth, and is being seen today for cough.  HPI: Cough This is a new problem. The current episode started 1 to 4 weeks ago. The problem has been gradually worsening. The problem occurs every few minutes. The cough is Productive of sputum. Associated symptoms include chills, myalgias, nasal congestion, postnasal drip, shortness of breath and wheezing. Pertinent negatives include no ear congestion, ear pain, fever or headaches. He has tried rest and OTC cough suppressant for the symptoms.   Problems:  Patient Active Problem List   Diagnosis Date Noted   Pulmonary fibrosis (HCC) 11/27/2020   Atherosclerosis of aorta (HCC) 11/27/2020   Atherosclerosis of right carotid artery 11/27/2020   Hospital discharge follow-up 10/15/2020   BPH (benign prostatic hyperplasia) 10/05/2020   Obstructive uropathy 10/05/2020   AKI (acute kidney injury) (HCC) 10/05/2020   Sarcoidosis of lung (HCC) 09/30/2019   Primary insomnia 02/14/2019   Bilateral primary osteoarthritis of knee 03/24/2018   PVC's (premature ventricular contractions) 02/03/2018   Hyperlipidemia 02/03/2018   Hyponatremia 08/06/2016   Constipation 10/29/2015   OA (osteoarthritis) of hip 04/09/2015   Hypogonadism in male 09/12/2014   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  At affected areas (avoid face and genitals), Disp: 50 g, Rfl: 1   azithromycin (ZITHROMAX) 250 MG tablet, Take 500 mg once, then 250 mg for four days, Disp: 6 tablet, Rfl: 0   benzonatate (TESSALON) 200 MG capsule, Take 1 capsule (200 mg total) by mouth 3 (three) times daily as needed., Disp: 30 capsule, Rfl: 1   predniSONE (STERAPRED UNI-PAK 21 TAB) 10 MG (21) TBPK tablet, Use as directed, Disp: 21 tablet, Rfl: 0   solifenacin (VESICARE) 10 MG tablet, Take 1 tablet (10 mg total) by mouth daily., Disp: 90 tablet, Rfl: 3   tamsulosin (FLOMAX) 0.4 MG CAPS capsule, TAKE 2 CAPSULES (0.8 MG TOTAL) BY MOUTH DAILY AFTER SUPPER., Disp: 180 capsule, Rfl: 0   testosterone cypionate (DEPOTESTOSTERONE CYPIONATE) 200 MG/ML injection, INJECT 1 ML (200 MG TOTAL) INTO THE MUSCLE EVERY 14 (FOURTEEN) DAYS., Disp: 6 mL, Rfl: 1  Current Facility-Administered Medications:    testosterone cypionate (DEPOTESTOSTERONE CYPIONATE) injection 200 mg, 200 mg, Intramuscular, Q14 Days, Stacks, Warren, MD, 200 mg at 02/06/21 1446  Observations/Objective: Patient is well-developed, well-nourished in no acute distress.  Resting comfortably   Head is normocephalic, atraumatic.  No labored breathing.  Speech is clear and coherent with logical content.  Patient is alert and oriented at baseline.  Hoarse voice  Assessment and Plan: 1. Acute bacterial bronchitis - azithromycin (ZITHROMAX) 250 MG tablet; Take 500 mg once, then 250 mg for four days  Dispense: 6 tablet; Refill: 0 - predniSONE (STERAPRED UNI-PAK 21 TAB) 10 MG (21) TBPK tablet; Use as directed  Dispense: 21 tablet; Refill: 0 - benzonatate (TESSALON) 200 MG capsule; Take 1 capsule (200 mg total) by mouth 3 (three) times daily as needed.  Dispense: 30 capsule; Refill: 1  - Take meds as prescribed - Use a cool mist humidifier  -Use saline nose sprays frequently -Force fluids -For any cough or congestion  Use plain Mucinex- regular strength or max strength is  fine -For fever or aces or pains- take tylenol or ibuprofen. -Throat lozenges if help   Follow Up Instructions: I discussed the assessment and treatment plan with the patient. The patient was provided an opportunity to ask questions and all were answered. The patient agreed with the plan and demonstrated an understanding of the instructions.  A copy of instructions were sent to the patient via MyChart unless otherwise noted below.     The patient was advised to call back or seek an in-person evaluation if the symptoms worsen or if the condition fails to improve as anticipated.  Time:  I spent 7 minutes with the patient via telehealth technology discussing the above problems/concerns.    Jannifer Rodney, FNP

## 2021-02-18 ENCOUNTER — Encounter: Payer: Self-pay | Admitting: Family Medicine

## 2021-02-19 ENCOUNTER — Other Ambulatory Visit: Payer: Self-pay

## 2021-02-19 ENCOUNTER — Ambulatory Visit (INDEPENDENT_AMBULATORY_CARE_PROVIDER_SITE_OTHER): Payer: 59 | Admitting: *Deleted

## 2021-02-19 DIAGNOSIS — E291 Testicular hypofunction: Secondary | ICD-10-CM | POA: Diagnosis not present

## 2021-02-19 DIAGNOSIS — N179 Acute kidney failure, unspecified: Secondary | ICD-10-CM

## 2021-02-19 MED ORDER — TESTOSTERONE CYPIONATE 200 MG/ML IM SOLN
200.0000 mg | INTRAMUSCULAR | Status: AC
  Administered 2021-02-19 – 2023-12-23 (×47): 200 mg via INTRAMUSCULAR

## 2021-02-19 NOTE — Progress Notes (Signed)
Patient in today for testosterone injection. 200 mg given in right upper outer quadrant. Patient tolerated well.   

## 2021-02-20 LAB — CMP14+EGFR
ALT: 22 IU/L (ref 0–44)
AST: 22 IU/L (ref 0–40)
Albumin/Globulin Ratio: 1.6 (ref 1.2–2.2)
Albumin: 4.3 g/dL (ref 3.8–4.9)
Alkaline Phosphatase: 122 IU/L — ABNORMAL HIGH (ref 44–121)
BUN/Creatinine Ratio: 16 (ref 9–20)
BUN: 22 mg/dL (ref 6–24)
Bilirubin Total: 0.5 mg/dL (ref 0.0–1.2)
CO2: 23 mmol/L (ref 20–29)
Calcium: 8.9 mg/dL (ref 8.7–10.2)
Chloride: 105 mmol/L (ref 96–106)
Creatinine, Ser: 1.38 mg/dL — ABNORMAL HIGH (ref 0.76–1.27)
Globulin, Total: 2.7 g/dL (ref 1.5–4.5)
Glucose: 73 mg/dL (ref 70–99)
Potassium: 4 mmol/L (ref 3.5–5.2)
Sodium: 144 mmol/L (ref 134–144)
Total Protein: 7 g/dL (ref 6.0–8.5)
eGFR: 60 mL/min/{1.73_m2} (ref >59)

## 2021-03-06 ENCOUNTER — Ambulatory Visit: Payer: 59 | Admitting: Nurse Practitioner

## 2021-03-06 ENCOUNTER — Ambulatory Visit: Payer: Self-pay

## 2021-03-06 ENCOUNTER — Ambulatory Visit (INDEPENDENT_AMBULATORY_CARE_PROVIDER_SITE_OTHER): Payer: 59

## 2021-03-06 ENCOUNTER — Encounter: Payer: Self-pay | Admitting: Nurse Practitioner

## 2021-03-06 VITALS — BP 139/80 | HR 87 | Temp 97.4°F | Resp 20 | Ht 75.0 in | Wt 239.0 lb

## 2021-03-06 DIAGNOSIS — E291 Testicular hypofunction: Secondary | ICD-10-CM | POA: Diagnosis not present

## 2021-03-06 DIAGNOSIS — R052 Subacute cough: Secondary | ICD-10-CM

## 2021-03-06 DIAGNOSIS — J069 Acute upper respiratory infection, unspecified: Secondary | ICD-10-CM | POA: Diagnosis not present

## 2021-03-06 MED ORDER — PSEUDOEPH-BROMPHEN-DM 30-2-10 MG/5ML PO SYRP: 5.0000 mL | ORAL_SOLUTION | Freq: Four times a day (QID) | ORAL | 0 refills | Status: DC | PRN

## 2021-03-06 NOTE — Progress Notes (Signed)
Testosterone injection given to left upper outer quadrant.  Patient tolerated well. 

## 2021-03-06 NOTE — Progress Notes (Signed)
? ?Acute Office Visit ? ?Subjective:  ? ? Patient ID: Luke Davila, male    DOB: 06-26-1965, 56 y.o.   MRN: [id number] ? ?Chief Complaint  ?Patient presents with  ? Cough  ? ? ?Cough ?This is a recurrent problem. The problem has been unchanged. The problem occurs constantly. The cough is Productive of sputum. Associated symptoms include headaches and nasal congestion. Pertinent negatives include no chest pain. He has tried prescription cough suppressant for the symptoms. The treatment provided mild relief.  ?URI  ?This is a recurrent problem. The current episode started 1 to 4 weeks ago. The problem has been unchanged. There has been no fever. Associated symptoms include coughing and headaches. Pertinent negatives include no abdominal pain, chest pain or neck pain. He has tried decongestant for the symptoms. The treatment provided mild relief.  ? ? ?Past Medical History:  ?Diagnosis Date  ? Avascular necrosis of hip ([place])   ? LEFT  ? Enlarged prostate   ? Glaucoma   ? Gout   ? History of gout   ? ? ?Past Surgical History:  ?Procedure Laterality Date  ? COLONOSCOPY WITH PROPOFOL N/A 11/27/2015  ? Procedure: COLONOSCOPY WITH PROPOFOL;  Surgeon: Danie Binder, MD;  Location: [facility];  Service: Endoscopy;  Laterality: N/A;  1100  ? FOOT SURGERY Left 1985  ? Left hand surgery    ? TOTAL HIP ARTHROPLASTY Left 04/09/2015  ? Procedure: LEFT TOTAL HIP ARTHROPLASTY ANTERIOR APPROACH;  Surgeon: Gaynelle Arabian, MD;  Location: [facility];  Service: Orthopedics;  Laterality: Left;  ? ? ?Family History  ?Problem Relation Age of Onset  ? Hypertension Mother   ? Colon cancer Neg Hx   ? ? ?Social History  ? ?Socioeconomic History  ? Marital status: Divorced  ?  Spouse name: Ramona  ? Number of children: 2  ? Years of education: 12th  ? Highest education level: Not on file  ?Occupational History  ?  Employer: [employer]  ?  Comment: Public house manager  ?Tobacco Use  ? Smoking status: Never  ? Smokeless tobacco: Never  ?Vaping Use  ?  Vaping Use: Never used  ?Substance and Sexual Activity  ? Alcohol use: Yes  ?  Comment: 3 beers daily  ? Drug use: No  ? Sexual activity: Yes  ?  Birth control/protection: None  ?Other Topics Concern  ? Not on file  ?Social History Narrative  ? Patient lives at home with his family.  ? Caffeine Use: 1 soda daily  ? Patient is right handed  ? ?Social Determinants of Health  ? ?Financial Resource Strain: Not on file  ?Food Insecurity: Not on file  ?Transportation Needs: Not on file  ?Physical Activity: Not on file  ?Stress: Not on file  ?Social Connections: Not on file  ?Intimate Partner Violence: Not on file  ? ? ?Outpatient Medications Prior to Visit  ?Medication Sig Dispense Refill  ? albuterol (VENTOLIN HFA) 108 (90 Base) MCG/ACT inhaler Inhale 2 puffs into the lungs every 6 (six) hours as needed for wheezing or shortness of breath. 1 each 11  ? tamsulosin (FLOMAX) 0.4 MG CAPS capsule TAKE 2 CAPSULES (0.8 MG TOTAL) BY MOUTH DAILY AFTER SUPPER. 180 capsule 0  ? testosterone cypionate (DEPOTESTOSTERONE CYPIONATE) 200 MG/ML injection INJECT 1 ML (200 MG TOTAL) INTO THE MUSCLE EVERY 14 (FOURTEEN) DAYS. 6 mL 1  ? augmented betamethasone dipropionate (DIPROLENE-AF) 0.05 % cream Apply topically 2 (two) times daily. At affected areas (avoid face and genitals) 50  g 1  ? azithromycin (ZITHROMAX) 250 MG tablet Take 500 mg once, then 250 mg for four days 6 tablet 0  ? benzonatate (TESSALON) 200 MG capsule Take 1 capsule (200 mg total) by mouth 3 (three) times daily as needed. 30 capsule 1  ? predniSONE (STERAPRED UNI-PAK 21 TAB) 10 MG (21) TBPK tablet Use as directed 21 tablet 0  ? solifenacin (VESICARE) 10 MG tablet Take 1 tablet (10 mg total) by mouth daily. 90 tablet 3  ? ?Facility-Administered Medications Prior to Visit  ?Medication Dose Route Frequency Provider Last Rate Last Admin  ? testosterone cypionate (DEPOTESTOSTERONE CYPIONATE) injection 200 mg  200 mg Intramuscular Q14 Days Claretta Fraise, MD   200 mg at  02/06/21 1446  ? testosterone cypionate (DEPOTESTOSTERONE CYPIONATE) injection 200 mg  200 mg Intramuscular Q14 Days Claretta Fraise, MD   200 mg at 03/06/21 1508  ? ? ?No Known Allergies ? ?Review of Systems  ?Constitutional: Negative.   ?HENT: Negative.    ?Respiratory:  Positive for cough.   ?Cardiovascular:  Negative for chest pain.  ?Gastrointestinal:  Negative for abdominal pain.  ?Musculoskeletal:  Negative for neck pain.  ?Neurological:  Positive for headaches.  ?All other systems reviewed and are negative. ? ?   ?Objective:  ?  ?Physical Exam ?Vitals and nursing note reviewed.  ?Constitutional:   ?   Appearance: Normal appearance.  ?HENT:  ?   Head: Normocephalic.  ?   Right Ear: External ear normal.  ?   Left Ear: External ear normal.  ?   Nose: Nose normal.  ?Eyes:  ?   Conjunctiva/sclera: Conjunctivae normal.  ?Cardiovascular:  ?   Rate and Rhythm: Normal rate and regular rhythm.  ?   Pulses: Normal pulses.  ?   Heart sounds: Normal heart sounds.  ?Pulmonary:  ?   Effort: Pulmonary effort is normal.  ?   Breath sounds: No wheezing or rales.  ?   Comments: Diminished lung sound bilateral lower lungs ?Abdominal:  ?   General: Bowel sounds are normal.  ?Skin: ?   General: Skin is warm.  ?   Findings: No rash.  ?Neurological:  ?   Mental Status: He is alert and oriented to person, place, and time.  ? ? ?BP 139/80   Pulse 87   Temp (!) 97.4 ?F (36.3 ?C) (Oral)   Resp 20   Ht 6' 3" (1.905 m)   Wt 239 lb (108.4 kg)   SpO2 98%   BMI 29.87 kg/m?  ?Wt Readings from Last 3 Encounters:  ?03/06/21 239 lb (108.4 kg)  ?11/27/20 266 lb 9.6 oz (120.9 kg)  ?11/01/20 260 lb (117.9 kg)  ? ? ?Health Maintenance Due  ?Topic Date Due  ? Zoster Vaccines- Shingrix (1 of 2) Never done  ? COVID-19 Vaccine (3 - Booster for Moderna series) 04/04/2020  ? ? ?There are no preventive care reminders to display for this patient. ? ? ?Lab Results  ?Component Value Date  ? TSH 2.970 07/15/2018  ? ?Lab Results  ?Component Value Date  ?  WBC 8.8 11/27/2020  ? HGB 9.0 (L) 11/27/2020  ? HCT 28.1 (L) 11/27/2020  ? MCV 79 11/27/2020  ? PLT 366 11/27/2020  ? ?Lab Results  ?Component Value Date  ? NA 144 02/19/2021  ? K 4.0 02/19/2021  ? CO2 23 02/19/2021  ? GLUCOSE 73 02/19/2021  ? BUN 22 02/19/2021  ? CREATININE 1.38 (H) 02/19/2021  ? BILITOT 0.5 02/19/2021  ? ALKPHOS 122 (H)  02/19/2021  ? AST 22 02/19/2021  ? ALT 22 02/19/2021  ? PROT 7.0 02/19/2021  ? ALBUMIN 4.3 02/19/2021  ? CALCIUM 8.9 02/19/2021  ? ANIONGAP 8 10/16/2020  ? EGFR 60 02/19/2021  ? ?Lab Results  ?Component Value Date  ? CHOL 278 (H) 11/27/2020  ? ?Lab Results  ?Component Value Date  ? HDL 37 (L) 11/27/2020  ? ?Lab Results  ?Component Value Date  ? LDLCALC 188 (H) 11/27/2020  ? ?Lab Results  ?Component Value Date  ? TRIG 273 (H) 11/27/2020  ? ?Lab Results  ?Component Value Date  ? CHOLHDL 7.5 (H) 11/27/2020  ? ?Lab Results  ?Component Value Date  ? HGBA1C 5.7 07/31/2020  ? ? ?   ?Assessment & Plan:  ?Take meds as prescribed ?- Use a cool mist humidifier  ?-Use saline nose sprays frequently ?-Force fluids ?-For fever or aches or pains- take Tylenol or ibuprofen. ?-Completed chest x-ray to rule out pneumonia results pending. ?-If symptoms do not improve, he may need to be COVID tested to rule this out ?Follow up with worsening unresolved symptoms  ?Problem List Items Addressed This Visit   ? ?  ? Endocrine  ? Hypogonadism in male - Primary  ? ?Other Visit Diagnoses   ? ? Subacute cough      ? Relevant Medications  ? brompheniramine-pseudoephedrine-DM 30-2-10 MG/5ML syrup  ? Other Relevant Orders  ? DG Chest 2 View  ? Upper respiratory tract infection, unspecified type      ? Relevant Medications  ? brompheniramine-pseudoephedrine-DM 30-2-10 MG/5ML syrup  ? Other Relevant Orders  ? DG Chest 2 View  ? ?  ? ? ? ?Meds ordered this encounter  ?Medications  ? brompheniramine-pseudoephedrine-DM 30-2-10 MG/5ML syrup  ?  Sig: Take 5 mLs by mouth 4 (four) times daily as needed.  ?  Dispense:  120  mL  ?  Refill:  0  ?  Order Specific Question:   Supervising Provider  ?  AnswerClaretta Fraise [[id number]]  ? ? ? ?Ivy Lynn, NP ? ?

## 2021-03-06 NOTE — Patient Instructions (Signed)

## 2021-03-11 NOTE — Progress Notes (Signed)
Cough does take a while to clear. X-ray results didn't show any acute symptoms. If patient is not comfortable with this response he may seek further care in the hospital via ED.  ?

## 2021-03-13 ENCOUNTER — Other Ambulatory Visit: Payer: Self-pay | Admitting: Urology

## 2021-03-18 ENCOUNTER — Other Ambulatory Visit: Payer: 59

## 2021-03-18 ENCOUNTER — Other Ambulatory Visit (HOSPITAL_COMMUNITY): Payer: Self-pay

## 2021-03-18 DIAGNOSIS — N139 Obstructive and reflux uropathy, unspecified: Secondary | ICD-10-CM

## 2021-03-18 NOTE — Patient Instructions (Signed)
DUE TO COVID-19 ONLY ONE VISITOR  (aged 56 and older)  IS ALLOWED TO COME WITH YOU AND STAY IN THE WAITING ROOM ONLY DURING PRE OP AND PROCEDURE.   **NO VISITORS ARE ALLOWED IN THE SHORT STAY AREA OR RECOVERY ROOM!!**  IF YOU WILL BE ADMITTED INTO THE HOSPITAL YOU ARE ALLOWED ONLY TWO SUPPORT PEOPLE DURING VISITATION HOURS ONLY (7 AM -8PM)   The support person(s) must pass our screening, gel in and out, and wear a mask at all times, including in the patients room. Patients must also wear a mask when staff or their support person are in the room. Visitors GUEST BADGE MUST BE WORN VISIBLY  One adult visitor may remain with you overnight and MUST be in the room by 8 P.M.     COVID SWAB TESTING MUST BE COMPLETED ON:  03/25/21 @    Marland KitchenARRIVE AT YOUR APPOINTMENT TIME STAFF IS NOT HERE BEFORE 8AM!!!*)    Site: [facility] [address] [place] Enter: Main Entrance have a seat in the waiting area to the right of main entrance (DO NOT CHECK IN AT ADMITTING!!!!!) Dial: [phone] to alert staff you have arrived  You are not required to quarantine, however you are required to wear a well-fitted mask when you are out and around people not in your household.  Hand Hygiene often Do NOT share personal items Notify your provider if you are in close contact with someone who has COVID or you develop fever 100.4 or greater, new onset of sneezing, cough, sore throat, shortness of breath or body aches.       Your procedure is scheduled on: 03/27/21   Report to [facility] Main Entrance    Report to admitting at 7:45 AM   Call this number if you have problems the morning of surgery [phone]   Do not eat food :After Midnight.   After Midnight you may have the following liquids until 7:00 AM DAY OF SURGERY  Water Black Coffee (sugar ok, NO MILK/CREAM OR CREAMERS)  Tea (sugar ok, NO MILK/CREAM OR CREAMERS) regular and decaf                             Plain Jell-O (NO  RED)                                           Fruit ices (not with fruit pulp, NO RED)                                     Popsicles (NO RED)                                                                  Juice: apple, WHITE grape, WHITE cranberry Sports drinks like Gatorade (NO RED) Clear broth(vegetable,chicken,beef)               FOLLOW BOWEL PREP AND ANY ADDITIONAL PRE OP INSTRUCTIONS YOU RECEIVED FROM YOUR SURGEON'S OFFICE!!!     Oral Hygiene is  also important to reduce your risk of infection.                                    Remember - BRUSH YOUR TEETH THE MORNING OF SURGERY WITH YOUR REGULAR TOOTHPASTE   Do NOT smoke after Midnight   Take these medicines the morning of surgery with A SIP OF WATER: Inhalers, Flomax  Bring CPAP mask and tubing day of surgery.                              You may not have any metal on your body including jewelry, and body piercing             Do not wear lotions, powders, cologne, or deodorant              Men may shave face and neck.   Do not bring valuables to the hospital. [facility] IS NOT             RESPONSIBLE   FOR VALUABLES.   Contacts, dentures or bridgework may not be worn into surgery.   Bring small overnight bag day of surgery.   Special Instructions: Bring a copy of your healthcare power of attorney and living will documents         the day of surgery if you haven't scanned them before.              Please read over the following fact sheets you were given: IF YOU HAVE QUESTIONS ABOUT YOUR PRE-OP INSTRUCTIONS PLEASE CALL [phone]- [facility]     [place] - Preparing for Surgery Before surgery, you can play an important role.  Because skin is not sterile, your skin needs to be as free of germs as possible.  You can reduce the number of germs on your skin by washing with CHG (chlorahexidine gluconate) soap before surgery.  CHG is an antiseptic cleaner which kills germs and bonds with the skin to continue killing germs  even after washing. Please DO NOT use if you have an allergy to CHG or antibacterial soaps.  If your skin becomes reddened/irritated stop using the CHG and inform your nurse when you arrive at Short Stay. Do not shave (including legs and underarms) for at least 48 hours prior to the first CHG shower.  You may shave your face/neck.  Please follow these instructions carefully:  1.  Shower with CHG Soap the night before surgery and the  morning of surgery.  2.  If you choose to wash your hair, wash your hair first as usual with your normal  shampoo.  3.  After you shampoo, rinse your hair and body thoroughly to remove the shampoo.                             4.  Use CHG as you would any other liquid soap.  You can apply chg directly to the skin and wash.  Gently with a scrungie or clean washcloth.  5.  Apply the CHG Soap to your body ONLY FROM THE NECK DOWN.   Do   not use on face/ open                           Wound or open sores. Avoid  contact with eyes, ears mouth and   genitals (private parts).                       Wash face,  Genitals (private parts) with your normal soap.             6.  Wash thoroughly, paying special attention to the area where your    surgery  will be performed.  7.  Thoroughly rinse your body with warm water from the neck down.  8.  DO NOT shower/wash with your normal soap after using and rinsing off the CHG Soap.                9.  Pat yourself dry with a clean towel.            10.  Wear clean pajamas.            11.  Place clean sheets on your bed the night of your first shower and do not  sleep with pets. Day of Surgery : Do not apply any lotions/deodorants the morning of surgery.  Please wear clean clothes to the hospital/surgery center.  FAILURE TO FOLLOW THESE INSTRUCTIONS MAY RESULT IN THE CANCELLATION OF YOUR SURGERY  PATIENT SIGNATURE_________________________________  NURSE  SIGNATURE__________________________________  ________________________________________________________________________

## 2021-03-18 NOTE — Progress Notes (Signed)
COVID swab appointment: 03/25/21 @  COVID Vaccine Completed: yes x2 Date COVID Vaccine completed: 08/07/19, 02/08/20 Has received booster: COVID vaccine manufacturer: Pfizer    [employer]   Date of COVID positive in last 90 days:  PCP - Mechele Claude, MD Cardiologist -   Chest x-ray - 03/06/21 Epic EKG - 11/14/20 CE req Stress Test -  ECHO - 02/23/18 Epic Cardiac Cath -  Pacemaker/ICD device last checked: Spinal Cord Stimulator:  Bowel Prep -   Sleep Study -  CPAP -   Fasting Blood Sugar -  Checks Blood Sugar _____ times a day  Blood Thinner Instructions: Aspirin Instructions: Last Dose:  Activity level:  Can go up a flight of stairs and perform activities of daily living without stopping and without symptoms of chest pain or shortness of breath.   Able to exercise without symptoms  Unable to go up a flight of stairs without symptoms of      Anesthesia review: atherosclerosis, PVCs  Patient denies shortness of breath, fever, cough and chest pain at PAT appointment   Patient verbalized understanding of instructions that were given to them at the PAT appointment. Patient was also instructed that they will need to review over the PAT instructions again at home before surgery.

## 2021-03-19 ENCOUNTER — Encounter (HOSPITAL_COMMUNITY)
Admission: RE | Admit: 2021-03-19 | Discharge: 2021-03-19 | Disposition: A | Discharge status: Home / Self Care | Payer: 59 | Source: Ambulatory Visit | Attending: Urology | Admitting: Urology

## 2021-03-19 ENCOUNTER — Encounter: Payer: Self-pay | Admitting: Family Medicine

## 2021-03-19 ENCOUNTER — Other Ambulatory Visit: Payer: Self-pay

## 2021-03-19 ENCOUNTER — Encounter (HOSPITAL_COMMUNITY): Payer: Self-pay

## 2021-03-19 VITALS — BP 157/67 | HR 87 | Temp 98.2°F | Resp 14 | Ht 75.0 in | Wt 245.8 lb

## 2021-03-19 DIAGNOSIS — I6521 Occlusion and stenosis of right carotid artery: Secondary | ICD-10-CM | POA: Diagnosis not present

## 2021-03-19 DIAGNOSIS — Z01812 Encounter for preprocedural laboratory examination: Secondary | ICD-10-CM | POA: Diagnosis not present

## 2021-03-19 HISTORY — DX: Unspecified osteoarthritis, unspecified site: M19.90

## 2021-03-19 HISTORY — DX: Pneumonia, unspecified organism: J18.9

## 2021-03-19 LAB — CBC
HCT: 38.9 % — ABNORMAL LOW (ref 39.0–52.0)
Hemoglobin: 12 g/dL — ABNORMAL LOW (ref 13.0–17.0)
MCH: 24.2 pg — ABNORMAL LOW (ref 26.0–34.0)
MCHC: 30.8 g/dL (ref 30.0–36.0)
MCV: 78.6 fL — ABNORMAL LOW (ref 80.0–100.0)
Platelets: 261 10*3/uL (ref 150–400)
RBC: 4.95 MIL/uL (ref 4.22–5.81)
RDW: 15.2 % (ref 11.5–15.5)
WBC: 5.5 10*3/uL (ref 4.0–10.5)
nRBC: 0 % (ref 0.0–0.2)

## 2021-03-19 LAB — BASIC METABOLIC PANEL
Anion gap: 7 (ref 5–15)
BUN: 13 mg/dL (ref 6–20)
CO2: 26 mmol/L (ref 22–32)
Calcium: 9 mg/dL (ref 8.9–10.3)
Chloride: 102 mmol/L (ref 98–111)
Creatinine, Ser: 1.66 mg/dL — ABNORMAL HIGH (ref 0.61–1.24)
GFR, Estimated: 48 mL/min — ABNORMAL LOW (ref >60)
Glucose, Bld: 109 mg/dL — ABNORMAL HIGH (ref 70–99)
Potassium: 3.8 mmol/L (ref 3.5–5.1)
Sodium: 135 mmol/L (ref 135–145)

## 2021-03-19 LAB — BMP8+EGFR
BUN/Creatinine Ratio: 7 — ABNORMAL LOW (ref 9–20)
BUN: 11 mg/dL (ref 6–24)
CO2: 24 mmol/L (ref 20–29)
Calcium: 9.5 mg/dL (ref 8.7–10.2)
Chloride: 102 mmol/L (ref 96–106)
Creatinine, Ser: 1.68 mg/dL — ABNORMAL HIGH (ref 0.76–1.27)
Glucose: 72 mg/dL (ref 70–99)
Potassium: 4.4 mmol/L (ref 3.5–5.2)
Sodium: 139 mmol/L (ref 134–144)
eGFR: 48 mL/min/{1.73_m2} — ABNORMAL LOW (ref >59)

## 2021-03-19 LAB — PSA: Prostatic Specific Antigen: 2.21 ng/mL (ref 0.00–4.00)

## 2021-03-19 NOTE — Progress Notes (Signed)
Creatinine 1.66. Results routed to Dr. Liliane Shi ?

## 2021-03-20 ENCOUNTER — Ambulatory Visit: Payer: 59

## 2021-03-20 ENCOUNTER — Other Ambulatory Visit: Payer: Self-pay | Admitting: Family Medicine

## 2021-03-20 DIAGNOSIS — N401 Enlarged prostate with lower urinary tract symptoms: Secondary | ICD-10-CM

## 2021-03-27 ENCOUNTER — Ambulatory Visit (HOSPITAL_BASED_OUTPATIENT_CLINIC_OR_DEPARTMENT_OTHER): Payer: 59 | Admitting: Anesthesiology

## 2021-03-27 ENCOUNTER — Encounter (HOSPITAL_COMMUNITY)
Admission: RE | Disposition: A | Discharge status: Home / Self Care | Payer: Self-pay | Source: Home / Self Care | Attending: Urology

## 2021-03-27 ENCOUNTER — Encounter (HOSPITAL_COMMUNITY): Payer: Self-pay | Admitting: Urology

## 2021-03-27 ENCOUNTER — Ambulatory Visit (HOSPITAL_COMMUNITY): Payer: 59 | Admitting: Physician Assistant

## 2021-03-27 ENCOUNTER — Ambulatory Visit (HOSPITAL_COMMUNITY)
Admission: RE | Admit: 2021-03-27 | Discharge: 2021-03-27 | Disposition: A | Discharge status: Home / Self Care | Payer: 59 | Attending: Urology | Admitting: Urology

## 2021-03-27 DIAGNOSIS — N401 Enlarged prostate with lower urinary tract symptoms: Secondary | ICD-10-CM | POA: Diagnosis present

## 2021-03-27 DIAGNOSIS — R3914 Feeling of incomplete bladder emptying: Secondary | ICD-10-CM | POA: Diagnosis not present

## 2021-03-27 DIAGNOSIS — N3289 Other specified disorders of bladder: Secondary | ICD-10-CM | POA: Insufficient documentation

## 2021-03-27 DIAGNOSIS — D759 Disease of blood and blood-forming organs, unspecified: Secondary | ICD-10-CM | POA: Diagnosis not present

## 2021-03-27 DIAGNOSIS — N289 Disorder of kidney and ureter, unspecified: Secondary | ICD-10-CM | POA: Diagnosis not present

## 2021-03-27 DIAGNOSIS — Z79899 Other long term (current) drug therapy: Secondary | ICD-10-CM | POA: Diagnosis not present

## 2021-03-27 DIAGNOSIS — R338 Other retention of urine: Secondary | ICD-10-CM | POA: Diagnosis not present

## 2021-03-27 DIAGNOSIS — D649 Anemia, unspecified: Secondary | ICD-10-CM | POA: Diagnosis not present

## 2021-03-27 DIAGNOSIS — N320 Bladder-neck obstruction: Secondary | ICD-10-CM

## 2021-03-27 HISTORY — PX: TRANSURETHRAL RESECTION OF PROSTATE: SHX73

## 2021-03-27 SURGERY — TURP (TRANSURETHRAL RESECTION OF PROSTATE)
Anesthesia: General

## 2021-03-27 MED ORDER — CHLORHEXIDINE GLUCONATE 0.12 % MT SOLN
15.0000 mL | Freq: Once | OROMUCOSAL | Status: AC
  Administered 2021-03-27: 15 mL via OROMUCOSAL

## 2021-03-27 MED ORDER — TRAMADOL HCL 50 MG PO TABS: 50.0000 mg | ORAL_TABLET | Freq: Four times a day (QID) | ORAL | 0 refills | Status: AC | PRN

## 2021-03-27 MED ORDER — AMISULPRIDE (ANTIEMETIC) 5 MG/2ML IV SOLN: 10.0000 mg | Freq: Once | INTRAVENOUS | Status: DC | PRN

## 2021-03-27 MED ORDER — DEXAMETHASONE SODIUM PHOSPHATE 10 MG/ML IJ SOLN
INTRAMUSCULAR | Status: AC
  Filled 2021-03-27: qty 1

## 2021-03-27 MED ORDER — CIPROFLOXACIN IN D5W 400 MG/200ML IV SOLN
400.0000 mg | INTRAVENOUS | Status: AC
  Administered 2021-03-27: 400 mg via INTRAVENOUS
  Filled 2021-03-27: qty 200

## 2021-03-27 MED ORDER — PROPOFOL 10 MG/ML IV BOLUS
INTRAVENOUS | Status: DC | PRN
  Administered 2021-03-27: 200 mg via INTRAVENOUS

## 2021-03-27 MED ORDER — PROPOFOL 10 MG/ML IV BOLUS
INTRAVENOUS | Status: AC
  Filled 2021-03-27: qty 20

## 2021-03-27 MED ORDER — PHENYLEPHRINE HCL (PRESSORS) 10 MG/ML IV SOLN
INTRAVENOUS | Status: DC | PRN
  Administered 2021-03-27: 80 ug via INTRAVENOUS

## 2021-03-27 MED ORDER — MIDAZOLAM HCL 5 MG/5ML IJ SOLN
INTRAMUSCULAR | Status: DC | PRN
  Administered 2021-03-27: 2 mg via INTRAVENOUS

## 2021-03-27 MED ORDER — FENTANYL CITRATE (PF) 100 MCG/2ML IJ SOLN
INTRAMUSCULAR | Status: DC | PRN
Start: 2021-03-27 — End: 2021-03-27
  Administered 2021-03-27: 50 ug via INTRAVENOUS
  Administered 2021-03-27: 100 ug via INTRAVENOUS
  Administered 2021-03-27: 50 ug via INTRAVENOUS

## 2021-03-27 MED ORDER — DEXAMETHASONE SODIUM PHOSPHATE 10 MG/ML IJ SOLN
INTRAMUSCULAR | Status: DC | PRN
  Administered 2021-03-27: 10 mg via INTRAVENOUS

## 2021-03-27 MED ORDER — ACETAMINOPHEN 10 MG/ML IV SOLN
1000.0000 mg | Freq: Once | INTRAVENOUS | Status: DC | PRN
  Administered 2021-03-27: 1000 mg via INTRAVENOUS

## 2021-03-27 MED ORDER — PHENAZOPYRIDINE HCL 200 MG PO TABS: 200.0000 mg | ORAL_TABLET | Freq: Three times a day (TID) | ORAL | 0 refills | Status: DC | PRN

## 2021-03-27 MED ORDER — ACETAMINOPHEN 10 MG/ML IV SOLN
INTRAVENOUS | Status: AC
  Filled 2021-03-27: qty 100

## 2021-03-27 MED ORDER — MIDAZOLAM HCL 2 MG/2ML IJ SOLN
INTRAMUSCULAR | Status: AC
  Filled 2021-03-27: qty 2

## 2021-03-27 MED ORDER — FENTANYL CITRATE (PF) 250 MCG/5ML IJ SOLN
INTRAMUSCULAR | Status: AC
  Filled 2021-03-27: qty 5

## 2021-03-27 MED ORDER — OXYBUTYNIN CHLORIDE 5 MG PO TABS: 5.0000 mg | ORAL_TABLET | Freq: Three times a day (TID) | ORAL | 1 refills | Status: DC | PRN

## 2021-03-27 MED ORDER — OXYCODONE HCL 5 MG PO TABS
ORAL_TABLET | ORAL | Status: AC
  Filled 2021-03-27: qty 1

## 2021-03-27 MED ORDER — CEPHALEXIN 500 MG PO CAPS: 500.0000 mg | ORAL_CAPSULE | Freq: Two times a day (BID) | ORAL | 0 refills | Status: AC

## 2021-03-27 MED ORDER — FENTANYL CITRATE PF 50 MCG/ML IJ SOSY: 25.0000 ug | PREFILLED_SYRINGE | INTRAMUSCULAR | Status: DC | PRN

## 2021-03-27 MED ORDER — SODIUM CHLORIDE 0.9 % IR SOLN
Status: DC | PRN
  Administered 2021-03-27: 9000 mL

## 2021-03-27 MED ORDER — ONDANSETRON HCL 4 MG/2ML IJ SOLN
INTRAMUSCULAR | Status: DC | PRN
  Administered 2021-03-27: 4 mg via INTRAVENOUS

## 2021-03-27 MED ORDER — LIDOCAINE 2% (20 MG/ML) 5 ML SYRINGE
INTRAMUSCULAR | Status: DC | PRN
  Administered 2021-03-27: 100 mg via INTRAVENOUS

## 2021-03-27 MED ORDER — LACTATED RINGERS IV SOLN: INTRAVENOUS | Status: DC

## 2021-03-27 MED ORDER — OXYCODONE HCL 5 MG PO TABS
5.0000 mg | ORAL_TABLET | Freq: Once | ORAL | Status: AC | PRN
  Administered 2021-03-27: 5 mg via ORAL

## 2021-03-27 MED ORDER — ORAL CARE MOUTH RINSE: 15.0000 mL | Freq: Once | OROMUCOSAL | Status: AC

## 2021-03-27 MED ORDER — ONDANSETRON HCL 4 MG/2ML IJ SOLN
INTRAMUSCULAR | Status: AC
  Filled 2021-03-27: qty 2

## 2021-03-27 MED ORDER — OXYCODONE HCL 5 MG/5ML PO SOLN: 5.0000 mg | Freq: Once | ORAL | Status: AC | PRN

## 2021-03-27 SURGICAL SUPPLY — 16 items
BAG URINE DRAIN 2000ML AR STRL (UROLOGICAL SUPPLIES) ×2 IMPLANT
BAG URO CATCHER STRL LF (MISCELLANEOUS) ×2 IMPLANT
CATH FOLEY 3WAY 30CC 22FR (CATHETERS) ×1 IMPLANT
DRAPE FOOT SWITCH (DRAPES) ×2 IMPLANT
GLOVE SURG ENC TEXT LTX SZ7.5 (GLOVE) ×3 IMPLANT
GOWN STRL REUS W/TWL XL LVL3 (GOWN DISPOSABLE) ×3 IMPLANT
HOLDER FOLEY CATH W/STRAP (MISCELLANEOUS) ×1 IMPLANT
LOOP CUT BIPOLAR 24F LRG (ELECTROSURGICAL) IMPLANT
MANIFOLD NEPTUNE II (INSTRUMENTS) ×2 IMPLANT
PACK CYSTO (CUSTOM PROCEDURE TRAY) ×2 IMPLANT
PENCIL SMOKE EVACUATOR (MISCELLANEOUS) IMPLANT
SET IRRIG Y TYPE TUR BLADDER L (SET/KITS/TRAYS/PACK) ×1 IMPLANT
SYR TOOMEY IRRIG 70ML (MISCELLANEOUS) ×2
SYRINGE TOOMEY IRRIG 70ML (MISCELLANEOUS) ×1 IMPLANT
TUBING CONNECTING 10 (TUBING) ×2 IMPLANT
TUBING UROLOGY SET (TUBING) ×2 IMPLANT

## 2021-03-27 NOTE — Anesthesia Preprocedure Evaluation (Addendum)
Anesthesia Evaluation  ?Patient identified by MRN, date of birth, ID band ?Patient awake ? ? ? ?Reviewed: ?Allergy & Precautions, NPO status , Patient's Chart, lab work & pertinent test results ? ?Airway ?Mallampati: II ? ?TM Distance: >3 FB ?Neck ROM: Full ? ? ? Dental ?no notable dental hx. ? ?  ?Pulmonary ?neg pulmonary ROS,  ?  ?Pulmonary exam normal ? ? ? ? ? ? ? Cardiovascular ?negative cardio ROS ?Normal cardiovascular exam ? ? ?  ?Neuro/Psych ?negative neurological ROS ? negative psych ROS  ? GI/Hepatic ?negative GI ROS, Neg liver ROS,   ?Endo/Other  ?negative endocrine ROS ? Renal/GU ?Renal disease  ? ?  ?Musculoskeletal ? ?(+) Arthritis , Gout  ? Abdominal ?  ?Peds ? Hematology ? ?(+) Blood dyscrasia, anemia ,   ?Anesthesia Other Findings ?BENIGN PROSTATE HYPERPLASIA WITH LOWERS URINARY TRACT SYMPTOMS ? Reproductive/Obstetrics ? ?  ? ? ? ? ? ? ? ? ? ? ? ? ? ?  ?  ? ? ? ? ? ? ? ?Anesthesia Physical ?Anesthesia Plan ? ?ASA: 2 ? ?Anesthesia Plan: General  ? ?Post-op Pain Management:   ? ?Induction: Intravenous ? ?PONV Risk Score and Plan: 2 and Ondansetron, Dexamethasone, Midazolam and Treatment may vary due to age or medical condition ? ?Airway Management Planned: LMA ? ?Additional Equipment:  ? ?Intra-op Plan:  ? ?Post-operative Plan: Extubation in OR ? ?Informed Consent: I have reviewed the patients History and Physical, chart, labs and discussed the procedure including the risks, benefits and alternatives for the proposed anesthesia with the patient or authorized representative who has indicated his/her understanding and acceptance.  ? ? ? ?Dental advisory given ? ?Plan Discussed with: CRNA ? ?Anesthesia Plan Comments:   ? ? ? ? ? ?Anesthesia Quick Evaluation ? ?

## 2021-03-27 NOTE — Op Note (Signed)
Operative Note ? ?Preoperative diagnosis:  ?1.  BPH with bladder outlet obstruction and incomplete bladder emptying  ? ?Postoperative diagnosis: ?1.  BPH with bladder outlet obstruction and incomplete bladder emptying  ? ?Procedure(s): ?1.  Bipolar TURP ? ?Surgeon: Rhoderick Moody, MD ? ?Assistants:  None ? ?Anesthesia:  General ? ?Complications:  None ? ?EBL: 10 mL ? ?Specimens: ?1. Prostate chips ? ?Drains/Catheters: ?1.  22 French three-way Foley catheter with 20 mL of sterile water in the balloon ? ?Intraoperative findings:   ?Moderately enlarged and obstructing prostatic urethra ?Moderate bladder trabeculation ? ?Indication:  Luke Davila is a 56 y.o. male with a history of BPH with LUTS and incomplete bladder emptying.  He underwent a cystoscopy in the office and was found to have a moderately enlarged prostate with a very capacious bladder and moderate detrusor trabeculation.  His most recent PVR was noted to be approximately 570 mL.  He has been consented for the above procedures, voices understanding and wishes to proceed. ? ?Description of procedure: ? ?After informed consent was obtained, the patient was brought to the operating room and general anesthesia was administered. The patient was then placed in the dorsolithotomy position and prepped and draped in usual sterile fashion. A timeout was performed. A 23 French rigid cystoscope was then inserted into the urethral meatus and advanced into the bladder under direct vision. A complete bladder survey revealed no intravesical pathology.  Both ureteral orifices were identified and well away from the bladder neck. ? ?The rigid cystoscope was then exchanged for a 26 French resectoscope with a bipolar loop working element.  Starting at the bladder neck and progressing distally to the verumontanum, the prostatic adenoma was systematically resected until a widely patent prostatic urethral channel was created.  All prostate chips were then hand irrigated  out of the bladder and sent to pathology for permanent section.  The resectoscope was then removed and exchanged for a 22 French three-way Foley catheter.  The three-way Foley catheter was then extensively hand irrigated until the irrigant returned clear to light pink.  The catheter was then placed to continuous bladder irrigation and placed on rubber band traction.  He tolerated the procedure well and was transferred to the postanesthesia unit in stable condition. ? ?Plan:  CBI in the postanesthesia unit with plans to discharge home with his Foley catheter ? ? ?

## 2021-03-27 NOTE — Anesthesia Procedure Notes (Signed)
Procedure Name: LMA Insertion ?Date/Time: 03/27/2021 10:10 AM ?Performed by: Illene Silver, CRNA ?Pre-anesthesia Checklist: Patient identified, Emergency Drugs available, Suction available and Patient being monitored ?Patient Re-evaluated:Patient Re-evaluated prior to induction ?Oxygen Delivery Method: Circle system utilized ?Preoxygenation: Pre-oxygenation with 100% oxygen ?Induction Type: IV induction ?LMA: LMA with gastric port inserted ?LMA Size: 5.0 ?Tube type: Oral ?Placement Confirmation: positive ETCO2 ?Tube secured with: Tape ?Dental Injury: Teeth and Oropharynx as per pre-operative assessment  ? ? ? ? ?

## 2021-03-27 NOTE — Transfer of Care (Signed)
Immediate Anesthesia Transfer of Care Note ? ?Patient: Luke Davila ? ?Procedure(s) Performed: BIPOLARTRANSURETHRAL RESECTION OF THE PROSTATE (TURP) ? ?Patient Location: PACU ? ?Anesthesia Type:General ? ?Level of Consciousness: awake, alert , oriented and patient cooperative ? ?Airway & Oxygen Therapy: Patient Spontanous Breathing and Patient connected to face mask oxygen ? ?Post-op Assessment: Report given to RN, Post -op Vital signs reviewed and stable and Patient moving all extremities X 4 ? ?Post vital signs: stable ? ?Last Vitals:  ?Vitals Value Taken Time  ?BP 124/81 03/27/21 1100  ?Temp 36.6 ?C 03/27/21 1055  ?Pulse 80 03/27/21 1100  ?Resp 23 03/27/21 1100  ?SpO2 100 % 03/27/21 1100  ?Vitals shown include unvalidated device data. ? ?Last Pain:  ?Vitals:  ? 03/27/21 0749  ?TempSrc: Oral  ?   ? ?  ? ?Complications: No notable events documented. ?

## 2021-03-27 NOTE — Anesthesia Postprocedure Evaluation (Signed)
Anesthesia Post Note ? ?Patient: Luke Davila ? ?Procedure(s) Performed: BIPOLARTRANSURETHRAL RESECTION OF THE PROSTATE (TURP) ? ?  ? ?Patient location during evaluation: PACU ?Anesthesia Type: General ?Level of consciousness: awake ?Pain management: pain level controlled ?Vital Signs Assessment: post-procedure vital signs reviewed and stable ?Respiratory status: spontaneous breathing, nonlabored ventilation, respiratory function stable and patient connected to nasal cannula oxygen ?Cardiovascular status: blood pressure returned to baseline and stable ?Postop Assessment: no apparent nausea or vomiting ?Anesthetic complications: no ? ? ?No notable events documented. ? ?Last Vitals:  ?Vitals:  ? 03/27/21 1200 03/27/21 1230  ?BP: (!) 135/97 (!) 147/89  ?Pulse: 76 66  ?Resp: 16 14  ?Temp: 36.7 ?C   ?SpO2: 98% 98%  ?  ?Last Pain:  ?Vitals:  ? 03/27/21 1230  ?TempSrc:   ?PainSc: 0-No pain  ? ? ?  ?  ?  ?  ?  ?  ? ?Carlyn Mullenbach P Dejean Tribby ? ? ? ? ?

## 2021-03-27 NOTE — H&P (Signed)
PRE-OP H&P ? ?Office Visit Report     03/04/2021  ? ?-------------------------------------------------------------------------------- ?  ?Luke FolksSamuel D. Davila  ?MRN: [id number]  ?DOB: January 18, 1965, 56 year old Male  ?PRIMARY CARE:    ?REFERRING:  Mechele ClaudeWarren Stacks, MD  ?PROVIDER:  Rhoderick Moodyhristopher Malita Ignasiak, M.D.  ?LOCATION:  [facility] [phone]  ?  ? ?-------------------------------------------------------------------------------- ?  ?CC/HPI: cc: Urinary Retention  ? ?HPI: 56 year old male with a past medical history of sarcoidosis, BPH, glaucoma, gout, left-sided avascular necrosis of his hip  ? ?10/16/20: He presents today for evaluation and management of urinary retention. He was seen on 10/03/2020 by his primary care physician due to knee pain and urethral discharge. He was found to have be hypertensive as well as have a creatinine of 8. He was subsequently sent to the hospital 2 days later after lab work returned and found to have bilateral hydronephrosis, as well as an acute kidney injury with a creatinine at of 8.11. He was seen by Nephrology and a Foley catheter was advised and decompressed his bladder of 542mls. He was diuresed, and hydrated. Discharge Creatinine was 3.05, Creatinine yesterday was 2.05. He was restarted on Flomax, as he had been out for a dew months. He was also started on a new BP medication, Norvasc. Today, He presents for a trial of void. The entering his room, the patient states he is dizzy and could barely walk across the parking lot. His heart rate was found to be irregular and bouncing between 60 and 120. He was also noted to be hypotensive. His manual blood pressure was around 80. He would like to be transported to the hospital by EMS today. His family was updated.  ? ?11/01/2020: Transported to the emergency department at time of last office visit. In brief, he received IV fluids and a dose of metoprolol with significant improvement in cardiovascular symptoms. He was noted to  have sinus tachycardia with some PVCs but this resolved. Close follow-up with his other provider strongly encouraged. Continued on tamsulosin. Now back today for follow-up trial of void. He continues tamsulosin and tolerating it well. Catheter has been draining appropriately without painful or leaking urgency, gross hematuria. He does have a history of BPH and has been on tamsulosin before. Prior labs continue to demonstrate renal insufficiency. Creatinine on 10/24 was 2.13 with a GFR of 36. He denies recent lightheadedness or dizziness, chest pain or palpitations, numbness or paresthesias. No interval fevers or chills, nausea/vomiting.  ? ?12/17/20: Above history noted. The patient currently does not have a catheter in place. He reports a fluctuating force of stream and does not always feel like he is adequately emptying his bladder. He has urgency and frequency every 1-2 hours, but denies incontinence. He also denies interval UTIs, dysuria or hematuria. Currently on tamsulosin BID w/o any side effects.  ? ?PVR- 253 mL  ? ?01/24/21: The patient is here today for a routine follow-up. He continues to take tamsulosin twice daily and notes improvement in his force of stream. He continues to have episodes of urinary urgency, but denies incontinence. He also struggles with postvoid dribbling but states that it is also improving with tamsulosin. He denies interval UTIs, dysuria or hematuria. He continues to take Keflex prophylactically. Recent BMP saw significant improvement in his serum creatinine which is now 1.28. He has a consultation with nephrology on Monday.  ? ?PVR: 283 mL--the patient admits that he only provided-urine for a specimen and did not completely empty  ? ?03/04/21: The patient is  here today with worsening LUTS. He reports a good FOS, but doesn't feel like he is emptying his bladder well. He reports sporadic episodes of urge in continence and nocturia x 2-3. Denies interval UTIs, dysuria or hematuria.   ? ?  ?ALLERGIES: No Allergies   ? ?MEDICATIONS: Tamsulosin Hcl 0.4 mg capsule 1 capsule PO Daily  ?Aspirin Ec 81 mg tablet, delayed release  ?Cephalexin  ?Iron  ?Testosterone  ?  ? ?GU PSH: Complex Uroflow - 2017 ?Cystoscopy - 2017 ? ?  ? ?NON-GU PSH: No Non-GU PSH   ? ?GU PMH: BPH w/LUTS - 01/24/2021, - 12/17/2020, - 11/01/2020, He does have a modest residual with significant slowing/fatigue of his stream, - 2017 ?Incomplete bladder emptying - 01/24/2021, - 12/17/2020 ?Urinary Retention - 11/01/2020, - 10/16/2020 ?ED due to arterial insufficiency (Worsening), The patient would like a prescription for Viagra - 2017 ?Primary hypogonadism, This has not been treated recently. - 2017 ?  ? ?NON-GU PMH: Pyuria/other UA findings - 12/17/2020 ?Dizziness and giddiness - 10/16/2020 ?  ? ?FAMILY HISTORY: No Family History   ? ?SOCIAL HISTORY: No Social History   ? ?REVIEW OF SYSTEMS:    ?GU Review Male:   Patient denies frequent urination, hard to postpone urination, burning/ pain with urination, get up at night to urinate, leakage of urine, stream starts and stops, trouble starting your stream, have to strain to urinate , erection problems, and penile pain.  ?Gastrointestinal (Upper):   Patient denies nausea, vomiting, and indigestion/ heartburn.  ?Gastrointestinal (Lower):   Patient denies diarrhea and constipation.  ?Constitutional:   Patient denies fever, night sweats, weight loss, and fatigue.  ?Skin:   Patient denies skin rash/ lesion and itching.  ?Eyes:   Patient denies blurred vision and double vision.  ?Ears/ Nose/ Throat:   Patient denies sore throat and sinus problems.  ?Hematologic/Lymphatic:   Patient denies swollen glands and easy bruising.  ?Cardiovascular:   Patient denies leg swelling and chest pains.  ?Respiratory:   Patient denies cough and shortness of breath.  ?Endocrine:   Patient denies excessive thirst.  ?Musculoskeletal:   Patient denies back pain and joint pain.  ?Neurological:   Patient denies  dizziness and headaches.  ?Psychologic:   Patient denies depression and anxiety.  ? ?VITAL SIGNS:    ?  03/04/2021 11:08 AM  ?Weight 246 lb / 111.58 kg  ?Height 75 in / 190.5 cm  ?BP 156/98 mmHg  ?Pulse 64 /min  ?Temperature 97.5 F / 36.3 C  ?BMI 30.7 kg/m?  ? ?GU PHYSICAL EXAMINATION:    ?Urethral Meatus: Normal size. No lesion, no wart, no discharge, no polyp. Normal location.  ?Penis: Circumcised, no warts, no cracks. No dorsal Peyronie's plaques, no left corporal Peyronie's plaques, no right corporal Peyronie's plaques, no scarring, no warts. No balanitis, no meatal stenosis.  ? ?MULTI-SYSTEM PHYSICAL EXAMINATION:    ? ?  ?Complexity of Data:  ?Records Review:   Previous Patient Records  ? ?PROCEDURES:    ?     Flexible Cystoscopy - 52000  ?Risks, benefits, and some of the potential complications of the procedure were discussed at length with the patient including infection, bleeding, voiding discomfort, urinary retention, fever, chills, sepsis, and others. All questions were answered. Informed consent was obtained. Antibiotic prophylaxis was given. Sterile technique and intraurethral analgesia were used.  ?Meatus:  Normal size. Normal location. Normal condition.  ?Urethra:  No strictures.  ?External Sphincter:  Normal.  ?Verumontanum:  Normal.  ?Prostate:  Borderline obstructing. No  hyperplasia.  ?Bladder Neck:  Non-obstructing.  ?Ureteral Orifices:  Normal location. Normal size. Normal shape. Effluxed clear urine.  ?Bladder:  Moderate trabeculation. No tumors. Normal mucosa. No stones. Very capacious bladder  ?  ?  ?The lower urinary tract was carefully examined. The procedure was well-tolerated and without complications. Antibiotic instructions were given. Instructions were given to call the office immediately for bloody urine, difficulty urinating, urinary retention, painful or frequent urination, fever, chills, nausea, vomiting or other illness. The patient stated that he understood these instructions and  would comply with them.  ? ?     PVR Ultrasound - 64403  ?Scanned Volume: 573 cc  ? ?     Simple Foley Catheterization - [id number]  ?A 16 French Foley catheter was inserted into the bladder using sterile technique. Lowella Dandy

## 2021-03-28 ENCOUNTER — Encounter (HOSPITAL_COMMUNITY): Payer: Self-pay | Admitting: Urology

## 2021-03-28 LAB — SURGICAL PATHOLOGY

## 2021-04-03 ENCOUNTER — Ambulatory Visit (INDEPENDENT_AMBULATORY_CARE_PROVIDER_SITE_OTHER): Payer: 59 | Admitting: *Deleted

## 2021-04-03 ENCOUNTER — Other Ambulatory Visit: Payer: 59

## 2021-04-03 DIAGNOSIS — E291 Testicular hypofunction: Secondary | ICD-10-CM | POA: Diagnosis not present

## 2021-04-03 DIAGNOSIS — N139 Obstructive and reflux uropathy, unspecified: Secondary | ICD-10-CM

## 2021-04-04 LAB — BMP8+EGFR
BUN/Creatinine Ratio: 11 (ref 9–20)
BUN: 13 mg/dL (ref 6–24)
CO2: 24 mmol/L (ref 20–29)
Calcium: 9.1 mg/dL (ref 8.7–10.2)
Chloride: 103 mmol/L (ref 96–106)
Creatinine, Ser: 1.2 mg/dL (ref 0.76–1.27)
Glucose: 80 mg/dL (ref 70–99)
Potassium: 4.1 mmol/L (ref 3.5–5.2)
Sodium: 140 mmol/L (ref 134–144)
eGFR: 71 mL/min/{1.73_m2} (ref >59)

## 2021-04-16 ENCOUNTER — Ambulatory Visit (INDEPENDENT_AMBULATORY_CARE_PROVIDER_SITE_OTHER): Payer: 59 | Admitting: Emergency Medicine

## 2021-04-16 ENCOUNTER — Other Ambulatory Visit: Payer: 59

## 2021-04-16 DIAGNOSIS — N139 Obstructive and reflux uropathy, unspecified: Secondary | ICD-10-CM

## 2021-04-16 DIAGNOSIS — E291 Testicular hypofunction: Secondary | ICD-10-CM

## 2021-04-17 LAB — BMP8+EGFR
BUN/Creatinine Ratio: 10 (ref 9–20)
BUN: 19 mg/dL (ref 6–24)
CO2: 21 mmol/L (ref 20–29)
Calcium: 9.2 mg/dL (ref 8.7–10.2)
Chloride: 105 mmol/L (ref 96–106)
Creatinine, Ser: 1.94 mg/dL — ABNORMAL HIGH (ref 0.76–1.27)
Glucose: 78 mg/dL (ref 70–99)
Potassium: 4.1 mmol/L (ref 3.5–5.2)
Sodium: 138 mmol/L (ref 134–144)
eGFR: 40 mL/min/{1.73_m2} — ABNORMAL LOW (ref >59)

## 2021-04-29 ENCOUNTER — Other Ambulatory Visit: Payer: 59

## 2021-04-29 DIAGNOSIS — N139 Obstructive and reflux uropathy, unspecified: Secondary | ICD-10-CM

## 2021-04-30 LAB — BMP8+EGFR
BUN/Creatinine Ratio: 8 — ABNORMAL LOW (ref 9–20)
BUN: 18 mg/dL (ref 6–24)
CO2: 20 mmol/L (ref 20–29)
Calcium: 9.6 mg/dL (ref 8.7–10.2)
Chloride: 107 mmol/L — ABNORMAL HIGH (ref 96–106)
Creatinine, Ser: 2.15 mg/dL — ABNORMAL HIGH (ref 0.76–1.27)
Glucose: 79 mg/dL (ref 70–99)
Potassium: 3.8 mmol/L (ref 3.5–5.2)
Sodium: 146 mmol/L — ABNORMAL HIGH (ref 134–144)
eGFR: 35 mL/min/{1.73_m2} — ABNORMAL LOW (ref >59)

## 2021-05-01 ENCOUNTER — Ambulatory Visit: Payer: 59 | Admitting: Family Medicine

## 2021-05-01 ENCOUNTER — Encounter: Payer: Self-pay | Admitting: Family Medicine

## 2021-05-01 VITALS — BP 136/88 | HR 83 | Temp 97.9°F | Ht 75.0 in | Wt 230.5 lb

## 2021-05-01 DIAGNOSIS — E291 Testicular hypofunction: Secondary | ICD-10-CM

## 2021-05-01 DIAGNOSIS — J8410 Pulmonary fibrosis, unspecified: Secondary | ICD-10-CM

## 2021-05-01 MED ORDER — PREDNISONE 20 MG PO TABS: 40.0000 mg | ORAL_TABLET | Freq: Every day | ORAL | 0 refills | Status: AC

## 2021-05-01 MED ORDER — BREZTRI AEROSPHERE 160-9-4.8 MCG/ACT IN AERO: 2.0000 | INHALATION_SPRAY | Freq: Two times a day (BID) | RESPIRATORY_TRACT | 11 refills | Status: DC

## 2021-05-01 NOTE — Patient Instructions (Signed)
Pulmonary Fibrosis  Pulmonary fibrosis is a type of lung disease that causes scarring. Over time, the scar tissue builds up in the air sacs of your lungs (alveoli). This makes it hard for you to breathe because less oxygen gets into your bloodstream. Scarring from pulmonary fibrosis is permanent and may lead to other serious health problems. What are the causes? There are many different causes of pulmonary fibrosis. In some cases, the cause is not known. This is called idiopathic pulmonary fibrosis. Other causes include: Exposure to chemicals and substances found in agricultural, farm, construction, or factory work. These include mold, asbestos, silica, metal dusts, and toxic fumes. Sarcoidosis. In this disease, areas of inflammatory cells (granulomas) form and most often affect the lungs. Autoimmune diseases. These include diseases such as rheumatoid arthritis, systemic sclerosis, or connective tissue disease. Taking certain medicines. These include drugs used in radiation therapy or used to treat seizures, heart problems, and some infections. What increases the risk? You are more likely to develop this condition if: You have a family history of the disease. You are an older person. The condition is more common in older adults. You have a history of smoking. You have a job that exposes you to certain chemicals. You have gastroesophageal reflux disease (GERD). What are the signs or symptoms? Symptoms of this condition include: Difficulty breathing that gets worse with activity. Shortness of breath (dyspnea). Dry, hacking cough. Rapid, shallow breathing during exercise or while at rest. Other symptoms may include: Loss of appetite or weight loss Tiredness (fatigue) or weakness. Bluish skin and lips. Rounded and enlarged fingertips (clubbing). How is this diagnosed? This condition may be diagnosed based on: Your symptoms and medical history. A physical exam. You may also have tests,  including: A test that involves looking inside your lungs with an instrument (bronchoscopy). Imaging studies of your lungs and heart. Tests to measure how well you are breathing (pulmonary function tests). Blood tests. Tests to see how well your lungs work while you are walking (pulmonary stress test). A procedure to remove a lung tissue sample to look at it under a microscope (biopsy). How is this treated? There is no cure for pulmonary fibrosis. Treatment focuses on managing symptoms and preventing scarring from getting worse. This may include: Medicines, such as: Steroids to prevent permanent lung changes. Medicines to suppress your body's defense system (immune system). Medicines to help with lung function by reducing inflammation or scarring. Ongoing monitoring with X-rays and lab work. Oxygen therapy. Pulmonary rehabilitation. Surgery. In some cases, a lung transplant is possible. Follow these instructions at home:  Medicines Take over-the-counter and prescription medicines only as told by your health care provider. Keep your vaccinations up to date as recommended by your health care provider. Activity Get regular exercise, but do not pick activities that are too strenuous for you. Ask your health care provider what activities are safe for you. If you have physical limitations, you may get exercise by walking, using a stationary bike, or doing chair exercises. Ask your health care provider about using oxygen while exercising. Do breathing exercises as told by your health care provider. Plan rest periods when you get tired. General instructions Do not use any products that contain nicotine or tobacco. These products include cigarettes, chewing tobacco, and vaping devices, such as e-cigarettes. If you need help quitting, ask your health care provider. If you are exposed to chemicals and substances at work, make sure that you wear a mask or respirator at all times. Learn to   manage  stress. If you need help to do this, ask your health care provider. Join a pulmonary rehabilitation program or a support group for people with pulmonary fibrosis. Eat small meals often so you do not get too full. Overeating can make breathing trouble worse. Maintain a healthy weight. Lose weight if you need to. Keep all follow-up visits. This is important. Where to find more information American Lung Association: www.lung.org National Heart, Lung, and Blood Institute: www.nhlbi.nih.gov Pulmonary Fibrosis Foundation: pulmonaryfibrosis.org Contact a health care provider if: You have symptoms that do not get better with medicines. You are not able to be as active as usual. You have trouble taking a deep breath. You have a fever or chills. You have blue lips or skin. You have a lot of headaches. You cough up mucus that is dark in color. You have feelings of depression or sadness. You are unable to sleep because it is hard to breathe. Get help right away if: Your symptoms suddenly worsen. You have chest pain. You cough up blood. You get very confused or sleepy. These symptoms may be an emergency. Get help right away. Call 911. Do not wait to see if the symptoms will go away. Do not drive yourself to the hospital. Summary Pulmonary fibrosis is a type of lung disease that causes scar tissue to build up in the air sacs of your lungs (alveoli) over time. This makes it hard for you to breathe because less oxygen gets into your bloodstream. Scarring from pulmonary fibrosis is permanent and may lead to other serious health problems. You are more likely to develop this condition if you have a family history of the condition or a job that exposes you to certain chemicals. There is no cure for pulmonary fibrosis. Treatment focuses on managing symptoms and preventing scarring from getting worse. This information is not intended to replace advice given to you by your health care provider. Make sure  you discuss any questions you have with your health care provider. Document Revised: 08/14/2020 Document Reviewed: 08/14/2020 Elsevier Patient Education  2023 Elsevier Inc.  

## 2021-05-01 NOTE — Progress Notes (Signed)
? ?  Acute Office Visit ? ?Subjective:  ? ?  ?Patient ID: Luke Davila, male    DOB: [birth year], 56 y.o.   MRN: [id number] ? ?Chief Complaint  ?Patient presents with  ? Cough  ? ? ?HPI ?Patient is in today for cough x 2 months. He also reports wheezing that seems constant. Reports shortness of breath with distances of 50-100 ft. He sometimes coughs up sputum. Denies fever, chest pain, edema, nasal congestion, sore throat, or orthopnea. He uses his albuterol inhaler daily. He has a history of pulmonary fibrosis. Reports he has never had a maintenance inhaler. He reports that he typically is prescribed prednisone when this occurs and that he does well with this.  ? ?ROS ?As per HPI.  ? ?   ?Objective:  ?  ?BP 136/88   Pulse 83   Temp 97.9 ?F (36.6 ?C) (Temporal)   Ht 6\' 3"  (1.905 m)   Wt 230 lb 8 oz (104.6 kg)   BMI 28.81 kg/m?  ? ? ?Physical Exam ?Vitals and nursing note reviewed.  ?Constitutional:   ?   General: He is not in acute distress. ?   Appearance: He is not ill-appearing, toxic-appearing or diaphoretic.  ?HENT:  ?   Head: Normocephalic and atraumatic.  ?   Nose: Nose normal.  ?   Mouth/Throat:  ?   Mouth: Mucous membranes are moist.  ?   Pharynx: Oropharynx is clear.  ?Cardiovascular:  ?   Rate and Rhythm: Normal rate and regular rhythm.  ?   Heart sounds: Normal heart sounds. No murmur heard. ?Pulmonary:  ?   Effort: Pulmonary effort is normal. No respiratory distress.  ?   Breath sounds: Normal breath sounds. No wheezing, rhonchi or rales.  ?Chest:  ?   Chest wall: No tenderness.  ?Musculoskeletal:  ?   Right lower leg: No edema.  ?   Left lower leg: No edema.  ?Skin: ?   General: Skin is warm and dry.  ?Neurological:  ?   Mental Status: He is alert and oriented to person, place, and time.  ?Psychiatric:     ?   Mood and Affect: Mood normal.     ?   Thought Content: Thought content normal.     ?   Judgment: Judgment normal.  ? ? ?No results found for any visits on 05/01/21. ? ? ?   ?Assessment &  Plan:  ? ?Luke Davila was seen today for cough. ? ?Diagnoses and all orders for this visit: ? ?Pulmonary fibrosis (HCC) ?Symptoms not well controlled. Breztri samples given and Rx sent. Copay card also provided. Prednisone burst ordered. Albuterol prn.  ?-     predniSONE (DELTASONE) 20 MG tablet; Take 2 tablets (40 mg total) by mouth daily with breakfast for 5 days. ?-     Budeson-Glycopyrrol-Formoterol (BREZTRI AEROSPHERE) 160-9-4.8 MCG/ACT AERO; Inhale 2 puffs into the lungs 2 (two) times daily. ? ?Return in about 4 weeks (around 05/29/2021) for follow up with PCP. ? ?The patient indicates understanding of these issues and agrees with the plan. ? ?05/31/2021, FNP ? ? ?

## 2021-05-06 ENCOUNTER — Telehealth: Payer: Self-pay | Admitting: Family Medicine

## 2021-05-06 NOTE — Telephone Encounter (Signed)
PATIENT AWARE

## 2021-05-06 NOTE — Telephone Encounter (Signed)
Since I was out of the office Thursday and Friday it is not ready yet.  It may be a couple of days, but I will get to it as quickly as possible. ?

## 2021-05-14 ENCOUNTER — Other Ambulatory Visit: Payer: Self-pay | Admitting: Family Medicine

## 2021-05-14 DIAGNOSIS — E291 Testicular hypofunction: Secondary | ICD-10-CM

## 2021-05-15 ENCOUNTER — Ambulatory Visit (INDEPENDENT_AMBULATORY_CARE_PROVIDER_SITE_OTHER): Payer: 59 | Admitting: Emergency Medicine

## 2021-05-15 ENCOUNTER — Other Ambulatory Visit: Payer: 59

## 2021-05-15 DIAGNOSIS — E291 Testicular hypofunction: Secondary | ICD-10-CM | POA: Diagnosis not present

## 2021-05-15 DIAGNOSIS — N139 Obstructive and reflux uropathy, unspecified: Secondary | ICD-10-CM

## 2021-05-16 LAB — BMP8+EGFR
BUN/Creatinine Ratio: 8 — ABNORMAL LOW (ref 9–20)
BUN: 13 mg/dL (ref 6–24)
CO2: 20 mmol/L (ref 20–29)
Calcium: 9.6 mg/dL (ref 8.7–10.2)
Chloride: 105 mmol/L (ref 96–106)
Creatinine, Ser: 1.55 mg/dL — ABNORMAL HIGH (ref 0.76–1.27)
Glucose: 114 mg/dL — ABNORMAL HIGH (ref 70–99)
Potassium: 4.2 mmol/L (ref 3.5–5.2)
Sodium: 141 mmol/L (ref 134–144)
eGFR: 53 mL/min/{1.73_m2} — ABNORMAL LOW (ref >59)

## 2021-05-16 NOTE — Progress Notes (Signed)
Hello Joel,  Your lab result is normal and/or stable.Some minor variations that are not significant are commonly marked abnormal, but do not represent any medical problem for you.  Best regards, Sahir Tolson, M.D.

## 2021-05-28 ENCOUNTER — Encounter: Payer: Self-pay | Admitting: Family Medicine

## 2021-05-28 ENCOUNTER — Ambulatory Visit: Payer: 59 | Admitting: Family Medicine

## 2021-05-28 VITALS — BP 143/77 | HR 72 | Temp 97.0°F | Ht 75.0 in | Wt 228.8 lb

## 2021-05-28 DIAGNOSIS — N1832 Chronic kidney disease, stage 3b: Secondary | ICD-10-CM

## 2021-05-28 DIAGNOSIS — E291 Testicular hypofunction: Secondary | ICD-10-CM

## 2021-05-28 DIAGNOSIS — N139 Obstructive and reflux uropathy, unspecified: Secondary | ICD-10-CM

## 2021-05-28 DIAGNOSIS — J8410 Pulmonary fibrosis, unspecified: Secondary | ICD-10-CM | POA: Diagnosis not present

## 2021-05-28 MED ORDER — PREDNISONE 10 MG PO TABS: ORAL_TABLET | ORAL | 0 refills | Status: DC

## 2021-05-28 NOTE — Progress Notes (Signed)
Shortness of breath.   Subjective:  Patient ID: Luke Davila, male    DOB: 02/28/1965  Age: 56 y.o. MRN: [id number]  CC: Follow-up and Pulmonary Fibrosis   HPI Luke Davila presents for recheck of renal function due to rising creatinine on recent lab. Additionally he has dyspnea with mild to moderate exertion. He has a cough that has been persistent for several weeks in spite of treatment. Itt is stable. No worse.    Follow up for testosterone deficiency: Pt. Using medication as directed. Denies any sx referrable to DVT such as edema or erythema of legs. No dyspnea or chest pain. Energy level reported as being good. Libido is normal and denies E.D. Feels strength is adequate and improved from baseline.      05/28/2021   11:11 AM 05/28/2021   10:54 AM 03/06/2021    2:59 PM  Depression screen PHQ 2/9  Decreased Interest 1 0 0  Down, Depressed, Hopeless 0 0 0  PHQ - 2 Score 1 0 0  Altered sleeping 2  0  Tired, decreased energy 2  0  Change in appetite 0  0  Feeling bad or failure about yourself  0  0  Trouble concentrating 0  0  Moving slowly or fidgety/restless 2  0  Suicidal thoughts   0  PHQ-9 Score 7  0  Difficult doing work/chores Not difficult at all      History Luke Davila has a past medical history of Arthritis, Avascular necrosis of hip ([place]), Enlarged prostate, Glaucoma, Gout, History of gout, and Pneumonia.   He has a past surgical history that includes Foot surgery (Left, 01/07/1983); Left hand surgery; Total hip arthroplasty (Left, 04/09/2015); Colonoscopy with propofol (N/A, 11/27/2015); Wisdom tooth extraction; and Transurethral resection of prostate (N/A, 03/27/2021).   His family history includes Hypertension in his mother.He reports that he has never smoked. He has never used smokeless tobacco. He reports current alcohol use. He reports that he does not use drugs.    ROS Review of Systems  Constitutional:  Positive for fatigue. Negative for fever.   Respiratory:  Positive for cough and shortness of breath.   Cardiovascular:  Negative for chest pain.  Musculoskeletal:  Negative for arthralgias.  Skin:  Negative for rash.  MS Started  Objective:  BP (!) 143/77   Pulse 72   Temp (!) 97 F (36.1 C)   Ht '6\' 3"'  (1.905 m)   Wt 228 lb 12.8 oz (103.8 kg)   SpO2 98%   BMI 28.60 kg/m   BP Readings from Last 3 Encounters:  05/28/21 (!) 143/77  05/01/21 136/88  03/27/21 (!) 147/89    Wt Readings from Last 3 Encounters:  05/28/21 228 lb 12.8 oz (103.8 kg)  05/01/21 230 lb 8 oz (104.6 kg)  03/19/21 245 lb 12.8 oz (111.5 kg)     Physical Exam Vitals reviewed.  Constitutional:      Appearance: He is well-developed.  HENT:     Head: Normocephalic and atraumatic.     Right Ear: External ear normal.     Left Ear: External ear normal.     Mouth/Throat:     Pharynx: No oropharyngeal exudate or posterior oropharyngeal erythema.  Eyes:     Pupils: Pupils are equal, round, and reactive to light.  Cardiovascular:     Rate and Rhythm: Normal rate and regular rhythm.     Heart sounds: No murmur heard. Pulmonary:     Effort: No respiratory distress.  Breath sounds: Normal breath sounds.  Musculoskeletal:     Cervical back: Normal range of motion and neck supple.  Neurological:     Mental Status: He is alert and oriented to person, place, and time.      Assessment & Plan:   Amad was seen today for follow-up and pulmonary fibrosis.  Diagnoses and all orders for this visit:  Pulmonary fibrosis ([place]) -     Ambulatory referral to Pulmonology -     CBC with Differential/Platelet  Obstructive uropathy -     BMP8+EGFR -     Testosterone,Free and Total  Hypogonadism in male -     Testosterone,Free and Total  Chronic renal impairment, stage 3b (HCC) -     CBC with Differential/Platelet  Other orders -     predniSONE (DELTASONE) 10 MG tablet; Take 5 daily for 2 days followed by 4,3,2 and 1 for 2 days  each.       I am having Luke Davila. Luke Davila "Denard" start on predniSONE. I am also having him maintain his albuterol, brompheniramine-pseudoephedrine-DM, aspirin EC, Artificial Tears, tamsulosin, oxybutynin, phenazopyridine, Breztri Aerosphere, and testosterone cypionate. We administered testosterone cypionate. We will continue to administer testosterone cypionate and testosterone cypionate.  Allergies as of 05/28/2021   No Known Allergies      Medication List        Accurate as of May 28, 2021  9:13 PM. If you have any questions, ask your nurse or doctor.          albuterol 108 (90 Base) MCG/ACT inhaler Commonly known as: VENTOLIN HFA Inhale 2 puffs into the lungs every 6 (six) hours as needed for wheezing or shortness of breath.   Artificial Tears 1 % ophthalmic solution Generic drug: carboxymethylcellulose Apply 1 drop to eye 2 (two) times daily as needed (dry eyes).   aspirin EC 81 MG tablet Take 81 mg by mouth daily. Swallow whole.   Breztri Aerosphere 160-9-4.8 MCG/ACT Aero Generic drug: Budeson-Glycopyrrol-Formoterol Inhale 2 puffs into the lungs 2 (two) times daily.   brompheniramine-pseudoephedrine-DM 30-2-10 MG/5ML syrup Take 5 mLs by mouth 4 (four) times daily as needed.   oxybutynin 5 MG tablet Commonly known as: DITROPAN Take 1 tablet (5 mg total) by mouth every 8 (eight) hours as needed for bladder spasms.   phenazopyridine 200 MG tablet Commonly known as: Pyridium Take 1 tablet (200 mg total) by mouth 3 (three) times daily as needed (for pain with urination).   predniSONE 10 MG tablet Commonly known as: DELTASONE Take 5 daily for 2 days followed by 4,3,2 and 1 for 2 days each. Started by: Claretta Fraise, MD   tamsulosin 0.4 MG Caps capsule Commonly known as: FLOMAX TAKE 2 CAPSULES (0.8 MG TOTAL) BY MOUTH DAILY AFTER SUPPER.   testosterone cypionate 200 MG/ML injection Commonly known as: DEPOTESTOSTERONE CYPIONATE INJECT 1 ML (200 MG TOTAL) INTO  THE MUSCLE EVERY 14 DAYS         Follow-up: Return in about 6 months (around 11/28/2021).  Claretta Fraise, M.D.

## 2021-05-29 LAB — CBC WITH DIFFERENTIAL/PLATELET
Basophils Absolute: 0 10*3/uL (ref 0.0–0.2)
Basos: 1 %
EOS (ABSOLUTE): 0.4 10*3/uL (ref 0.0–0.4)
Eos: 6 %
Hematocrit: 36.1 % — ABNORMAL LOW (ref 37.5–51.0)
Hemoglobin: 11.6 g/dL — ABNORMAL LOW (ref 13.0–17.7)
Immature Grans (Abs): 0 10*3/uL (ref 0.0–0.1)
Immature Granulocytes: 0 %
Lymphocytes Absolute: 1.5 10*3/uL (ref 0.7–3.1)
Lymphs: 27 %
MCH: 24.7 pg — ABNORMAL LOW (ref 26.6–33.0)
MCHC: 32.1 g/dL (ref 31.5–35.7)
MCV: 77 fL — ABNORMAL LOW (ref 79–97)
Monocytes Absolute: 0.5 10*3/uL (ref 0.1–0.9)
Monocytes: 10 %
Neutrophils Absolute: 3 10*3/uL (ref 1.4–7.0)
Neutrophils: 56 %
Platelets: 261 10*3/uL (ref 150–450)
RBC: 4.69 x10E6/uL (ref 4.14–5.80)
RDW: 15.2 % (ref 11.6–15.4)
WBC: 5.4 10*3/uL (ref 3.4–10.8)

## 2021-05-29 LAB — BMP8+EGFR
BUN/Creatinine Ratio: 5 — ABNORMAL LOW (ref 9–20)
BUN: 7 mg/dL (ref 6–24)
CO2: 25 mmol/L (ref 20–29)
Calcium: 9 mg/dL (ref 8.7–10.2)
Chloride: 101 mmol/L (ref 96–106)
Creatinine, Ser: 1.34 mg/dL — ABNORMAL HIGH (ref 0.76–1.27)
Glucose: 79 mg/dL (ref 70–99)
Potassium: 4.1 mmol/L (ref 3.5–5.2)
Sodium: 138 mmol/L (ref 134–144)
eGFR: 63 mL/min/{1.73_m2} (ref >59)

## 2021-05-29 LAB — TESTOSTERONE,FREE AND TOTAL
Testosterone, Free: 14.5 pg/mL (ref 7.2–24.0)
Testosterone: 909 ng/dL (ref 264–916)

## 2021-05-30 ENCOUNTER — Encounter: Payer: Self-pay | Admitting: Family Medicine

## 2021-05-31 LAB — SPECIMEN STATUS REPORT

## 2021-05-31 LAB — IRON AND TIBC
Iron Saturation: 19 % (ref 15–55)
Iron: 48 ug/dL (ref 38–169)
Total Iron Binding Capacity: 249 ug/dL — ABNORMAL LOW (ref 250–450)
UIBC: 201 ug/dL (ref 111–343)

## 2021-05-31 LAB — FERRITIN: Ferritin: 294 ng/mL (ref 30–400)

## 2021-06-10 ENCOUNTER — Ambulatory Visit (INDEPENDENT_AMBULATORY_CARE_PROVIDER_SITE_OTHER): Payer: 59 | Admitting: Adult Health

## 2021-06-10 ENCOUNTER — Encounter: Payer: Self-pay | Admitting: Adult Health

## 2021-06-10 VITALS — BP 120/80 | HR 98 | Temp 98.3°F | Ht 75.0 in | Wt 230.0 lb

## 2021-06-10 DIAGNOSIS — J8410 Pulmonary fibrosis, unspecified: Secondary | ICD-10-CM | POA: Diagnosis not present

## 2021-06-10 DIAGNOSIS — D860 Sarcoidosis of lung: Secondary | ICD-10-CM

## 2021-06-10 NOTE — Patient Instructions (Addendum)
Set up for HRCT chest  Labs today  Activity for as tolerated.  Follow with Dr. Isaiah Serge this month as planned with PFTs and As needed

## 2021-06-10 NOTE — Addendum Note (Signed)
Addended by: Delrae Rend on: 06/10/2021 04:46 PM   Modules accepted: Orders

## 2021-06-10 NOTE — Assessment & Plan Note (Signed)
CT chest suspicious for underlying sarcoidosis.  Check PFTs and repeat high-resolution CT chest.  Check ACE level.

## 2021-06-10 NOTE — Progress Notes (Signed)
@Patient  ID: Luke Davila, male    DOB: 1965/09/11, 56 y.o.   MRN: [id number]  Chief Complaint  Patient presents with   Consult    Referring provider: Claretta Fraise, MD  HPI: 56 year old male never smoker seen for pulmonary consult June 10, 2021 for pulmonary fibrosis  TEST/EVENTS :  High-resolution CT chest July 02, 2020 moderate pulmonary fibrosis with upper lobe predominance, featuring extensive predominantly Bronchovascular interstitial opacity, groundglass and consolidation and substantial volume loss in the upper lobes, scattered nodules and consolidations throughout the lungs stable to previous exam, numerous enlarged mediastinal and hilar lymph nodes unchanged  CT chest August 17, 2019 shows upper lobe predominant volume loss with suspected chronic lung disease, scattered nodularity, mediastinal and hilar adenopathy suspicious for possible underlying sarcoidosis.   06/10/2021 Pulmonary consult  Patient presents today for a pulmonary consult.  Kindly referred by Dr. Livia Snellen.  Patient has been referred to our office for abnormal CT chest that shows moderate pulmonary fibrosis with upper lobe predominance. Had CT chest 2021 , was told he had Sarcoid. No previous or PFTs . 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 Says he has been getting progressively worse with cough and shortness of breath for last 2 year. Roney Jaffe he has flare up of his breathing. Describes as coughing fits. Gets prednisone usually 2-3 times a year. Started  on BREZTRI 1 month ago, feels it may help some. Last prednisone taper was 1 week ago. Says cough is better. Also has joint pains , especially in knees. Limits his ability to walk.  Patient works full-time in Psychologist, educational.  Patient has to use a golf cart to get around at work.  He says he is also limited by shortness of breath. He denies any discolored mucus, chest pain, orthopnea, edema.  Patient says he does get rash on his lower  extremities especially at his shins with a darkened discoloration.  SH : Patient is divorced.  He is here today with his ex-wife who helps with his medical care.  He is a never smoker.  Denies any alcohol use.  Patient says he smoked marijuana briefly as a young adult has not had any in over 30 years.  No other drug use.  Patient has adult children.  Lives alone.  Able to do all ADLs.  Does drive.  Family history is positive for colon cancer, COPD and asthma.,  Hypertension denies any known family history of sarcoidosis or autoimmune disorder  Medical history significant for BPH status post TURP, bilateral arthritis of the knees.  Obstructive uropathy, chronic kidney disease, gout  Surgical history TURP, left hip surgery, left foot surgery, hand surgery   [place] Integrated Comprehensive ILD Questionnaire  Symptoms:  Cough and shortness of breath x 2 yrs   SYMPTOM SCALE - ILD 06/10/2021  Current weight   O2 use No   Shortness of Breath 0 -> 5 scale with 5 being worst (score 6 If unable to do)  At rest 0  Simple tasks - showers, clothes change, eating, shaving 0  Household (dishes, doing bed, laundry) 1  Shopping 1  Walking level at own pace 2  Walking up Stairs 2  Total (30-36) Dyspnea Score 6      Non-dyspnea symptoms (0-> 5 scale) 06/10/2021  How bad is your cough? 3  How bad is your fatigue 1  How bad is nausea 0  How bad is vomiting?  0  How bad is diarrhea? 0  How  bad is anxiety? 0  How bad is depression 0  Any chronic pain - if so where and how bad 3     Past Medical History :   Medical history significant for BPH status post TURP, bilateral arthritis of the knees.  Obstructive uropathy, chronic kidney disease, gout   ROS:  + cough /dyspnea/ + knee pain /fatigue   FAMILY HISTORY of LUNG DISEASE:  COPD, Asthma   PERSONAL EXPOSURE HISTORY:  None   HOME  EXPOSURE and HOBBY DETAILS :  Previous dog, none for >6 months  Lives in mobile home  No hot tub,  basement , Cpap , nebs, no chickens, or birds  OCCUPATIONAL HISTORY (122 questions) : Works in Press photographer, Psychologist, educational, dust . Otherwise neg . Did wood working >20 yrs ago  Has used oil heater in past. , has painted and textile work in past.   PULMONARY TOXICITY HISTORY (27 items):  Neg  Has taken prednisone several times, usually 2-3 times a year.   INVESTIGATIONS: None      No Known Allergies  Immunization History  Administered Date(s) Administered   Influenza Split 10/10/2020   Moderna Sars-Covid-2 Vaccination 08/07/2019, 02/08/2020   Tdap 07/14/2017, 11/12/2020    Past Medical History:  Diagnosis Date   Arthritis    Avascular necrosis of hip ([place])    LEFT   Enlarged prostate    Glaucoma    Gout    History of gout    Pneumonia     Tobacco History: Social History   Tobacco Use  Smoking Status Never  Smokeless Tobacco Never   Counseling given: Not Answered   Outpatient Medications Prior to Visit  Medication Sig Dispense Refill   albuterol (VENTOLIN HFA) 108 (90 Base) MCG/ACT inhaler Inhale 2 puffs into the lungs every 6 (six) hours as needed for wheezing or shortness of breath. 1 each 11   ARTIFICIAL TEARS 1 % ophthalmic solution Apply 1 drop to eye 2 (two) times daily as needed (dry eyes).     Budeson-Glycopyrrol-Formoterol (BREZTRI AEROSPHERE) 160-9-4.8 MCG/ACT AERO Inhale 2 puffs into the lungs 2 (two) times daily. 10.7 g 11   tamsulosin (FLOMAX) 0.4 MG CAPS capsule TAKE 2 CAPSULES (0.8 MG TOTAL) BY MOUTH DAILY AFTER SUPPER. 180 capsule 0   testosterone cypionate (DEPOTESTOSTERONE CYPIONATE) 200 MG/ML injection INJECT 1 ML (200 MG TOTAL) INTO THE MUSCLE EVERY 14 DAYS 6 mL 1   aspirin EC 81 MG tablet Take 81 mg by mouth daily. Swallow whole. (Patient not taking: Reported on 03/14/2021)     brompheniramine-pseudoephedrine-DM 30-2-10 MG/5ML syrup Take 5 mLs by mouth 4 (four) times daily as needed. (Patient not taking: Reported on 03/14/2021) 120 mL 0    oxybutynin (DITROPAN) 5 MG tablet Take 1 tablet (5 mg total) by mouth every 8 (eight) hours as needed for bladder spasms. (Patient not taking: Reported on 05/01/2021) 30 tablet 1   phenazopyridine (PYRIDIUM) 200 MG tablet Take 1 tablet (200 mg total) by mouth 3 (three) times daily as needed (for pain with urination). (Patient not taking: Reported on 05/01/2021) 30 tablet 0   predniSONE (DELTASONE) 10 MG tablet Take 5 daily for 2 days followed by 4,3,2 and 1 for 2 days each. (Patient not taking: Reported on 06/10/2021) 30 tablet 0   Facility-Administered Medications Prior to Visit  Medication Dose Route Frequency Provider Last Rate Last Admin   testosterone cypionate (DEPOTESTOSTERONE CYPIONATE) injection 200 mg  200 mg Intramuscular Q14 Days Claretta Fraise, MD   200 mg at  02/06/21 1446   testosterone cypionate (DEPOTESTOSTERONE CYPIONATE) injection 200 mg  200 mg Intramuscular Q14 Days Claretta Fraise, MD   200 mg at 05/28/21 1114     Review of Systems:   Constitutional:   No  weight loss, night sweats,  Fevers, chills,  +fatigue, or  lassitude.  HEENT:   No headaches,  Difficulty swallowing,  Tooth/dental problems, or  Sore throat,                No sneezing, itching, ear ache, nasal congestion, post nasal drip,   CV:  No chest pain,  Orthopnea, PND, swelling in lower extremities, anasarca, dizziness, palpitations, syncope.   GI  No heartburn, indigestion, abdominal pain, nausea, vomiting, diarrhea, change in bowel habits, loss of appetite, bloody stools.   Resp:   No chest wall deformity  Skin: no rash or lesions.  GU: no dysuria, change in color of urine, no urgency or frequency.  No flank pain, no hematuria   MS: + joint pain     Physical Exam  BP 120/80 (BP Location: Left Arm, Patient Position: Sitting, Cuff Size: Large)   Pulse 98   Temp 98.3 F (36.8 C) (Oral)   Ht 6\' 3"  (1.905 m)   Wt 230 lb (104.3 kg)   SpO2 97%   BMI 28.75 kg/m   GEN: A/Ox3; pleasant , NAD, well  nourished    HEENT:  [place]/AT,   NOSE-clear, THROAT-clear, no lesions, no postnasal drip or exudate noted.   NECK:  Supple w/ fair ROM; no JVD; normal carotid impulses w/o bruits; no thyromegaly or nodules palpated; no lymphadenopathy.    RESP  Clear  P & A; w/o, wheezes/ rales/ or rhonchi. no accessory muscle use, no dullness to percussion  CARD:  RRR, no m/r/g, no peripheral edema, pulses intact, no cyanosis or clubbing.  GI:   Soft & nt; nml bowel sounds; no organomegaly or masses detected.   Musco: Warm bil, no deformities or joint swelling noted.   Neuro: alert, no focal deficits noted.    Skin: Warm, hyperpigmented lesions along bilateral shins      BMET     ProBNP No results found for: PROBNP  Imaging: No results found.  testosterone cypionate (DEPOTESTOSTERONE CYPIONATE) injection 200 mg     Date Action Dose Route User   05/28/2021 1114 Given 200 mg Intramuscular (Right Upper Outer Quadrant) Bullins, Cleda Clarks, LPN   [id number] Given 200 mg Intramuscular (Left Upper Outer Quadrant) Sigurd Sos, LPN   [id number] Given 200 mg Intramuscular (Right Upper Outer Quadrant) [place], [place]   [id number] Given 200 mg Intramuscular (Left Upper Outer Quadrant) Peterman, Amy M, LPN           View : No data to display.          No results found for: NITRICOXIDE      Assessment & Plan:   Pulmonary fibrosis ([place]) CT chest dating back to August 2021 showing pulmonary fibrosis changes.  Upper lobe predominance along with enlarged mediastinal and hilar lymph nodes and scattered nodularity suspicious for underlying sarcoidosis.  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  Records are unavailable along with joint pain and skin lesions all suspicious for underlying sarcoid.  Patient will need a repeat high-resolution CT chest to look for progressive changes.  Set up for pulmonary function testing.  Lab work including  autoimmune/connective tissue and ACE level. For now patient is continue on Breztri inhaler.  Will  evaluate PFTs on return again at restrictive and obstructive changes. 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 On return visit we will decide if  tissue sampling is indicated  Plan  Patient Instructions  Set up for HRCT chest  Labs today  Activity for as tolerated.  Follow with Dr. Vaughan Browner this month as planned with PFTs and As needed        Sarcoidosis of lung [facility]) CT chest suspicious for underlying sarcoidosis.  Check PFTs and repeat high-resolution CT chest.  Check ACE level.  I spent  61  minutes dedicated to the care of this patient on the date of this encounter to include pre-visit review of records, face-to-face time with the patient discussing conditions above, post visit ordering of testing, clinical documentation with the electronic health record, making appropriate referrals as documented, and communicating necessary findings to members of the patients care team.    Rexene Edison, NP 06/10/2021

## 2021-06-10 NOTE — Assessment & Plan Note (Signed)
CT chest dating back to August 2021 showing pulmonary fibrosis changes.  Upper lobe predominance along with enlarged mediastinal and hilar lymph nodes and scattered nodularity suspicious for underlying sarcoidosis.  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  Records are unavailable along with joint pain and skin lesions all suspicious for underlying sarcoid.  Patient will need a repeat high-resolution CT chest to look for progressive changes.  Set up for pulmonary function testing.  Lab work including autoimmune/connective tissue and ACE level. For now patient is continue on Breztri inhaler.  Will evaluate PFTs on return again at restrictive and obstructive changes. 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 On return visit we will decide if  tissue sampling is indicated  Plan  Patient Instructions  Set up for HRCT chest  Labs today  Activity for as tolerated.  Follow with Dr. Vaughan Browner this month as planned with PFTs and As needed

## 2021-06-11 ENCOUNTER — Other Ambulatory Visit: Payer: 59

## 2021-06-11 DIAGNOSIS — N139 Obstructive and reflux uropathy, unspecified: Secondary | ICD-10-CM

## 2021-06-11 LAB — CBC WITH DIFFERENTIAL/PLATELET
Basophils Absolute: 0.1 10*3/uL (ref 0.0–0.1)
Basophils Relative: 1.1 % (ref 0.0–3.0)
Eosinophils Absolute: 0.4 10*3/uL (ref 0.0–0.7)
Eosinophils Relative: 6.6 % — ABNORMAL HIGH (ref 0.0–5.0)
HCT: 36.9 % — ABNORMAL LOW (ref 39.0–52.0)
Hemoglobin: 11.9 g/dL — ABNORMAL LOW (ref 13.0–17.0)
Lymphocytes Relative: 25.8 % (ref 12.0–46.0)
Lymphs Abs: 1.5 10*3/uL (ref 0.7–4.0)
MCHC: 32.2 g/dL (ref 30.0–36.0)
MCV: 77.7 fl — ABNORMAL LOW (ref 78.0–100.0)
Monocytes Absolute: 0.7 10*3/uL (ref 0.1–1.0)
Monocytes Relative: 12.5 % — ABNORMAL HIGH (ref 3.0–12.0)
Neutro Abs: 3 10*3/uL (ref 1.4–7.7)
Neutrophils Relative %: 54 % (ref 43.0–77.0)
Platelets: 251 10*3/uL (ref 150.0–400.0)
RBC: 4.75 Mil/uL (ref 4.22–5.81)
RDW: 16.5 % — ABNORMAL HIGH (ref 11.5–15.5)
WBC: 5.6 10*3/uL (ref 4.0–10.5)

## 2021-06-11 LAB — BASIC METABOLIC PANEL
BUN: 14 mg/dL (ref 6–23)
CO2: 27 mEq/L (ref 19–32)
Calcium: 8.8 mg/dL (ref 8.4–10.5)
Chloride: 101 mEq/L (ref 96–112)
Creatinine, Ser: 1.4 mg/dL (ref 0.40–1.50)
GFR: 56.48 mL/min — ABNORMAL LOW (ref >60.00)
Glucose, Bld: 73 mg/dL (ref 70–99)
Potassium: 3.9 mEq/L (ref 3.5–5.1)
Sodium: 134 mEq/L — ABNORMAL LOW (ref 135–145)

## 2021-06-11 LAB — BMP8+EGFR
BUN/Creatinine Ratio: 9 (ref 9–20)
BUN: 12 mg/dL (ref 6–24)
CO2: 24 mmol/L (ref 20–29)
Calcium: 8.8 mg/dL (ref 8.7–10.2)
Chloride: 101 mmol/L (ref 96–106)
Creatinine, Ser: 1.37 mg/dL — ABNORMAL HIGH (ref 0.76–1.27)
Glucose: 82 mg/dL (ref 70–99)
Potassium: 4.1 mmol/L (ref 3.5–5.2)
Sodium: 136 mmol/L (ref 134–144)
eGFR: 61 mL/min/{1.73_m2} (ref >59)

## 2021-06-11 LAB — HEPATIC FUNCTION PANEL
ALT: 14 U/L (ref 0–53)
AST: 20 U/L (ref 0–37)
Albumin: 3.9 g/dL (ref 3.5–5.2)
Alkaline Phosphatase: 100 U/L (ref 39–117)
Bilirubin, Direct: 0.2 mg/dL (ref 0.0–0.3)
Total Bilirubin: 1.3 mg/dL — ABNORMAL HIGH (ref 0.2–1.2)
Total Protein: 7 g/dL (ref 6.0–8.3)

## 2021-06-12 ENCOUNTER — Ambulatory Visit (INDEPENDENT_AMBULATORY_CARE_PROVIDER_SITE_OTHER): Payer: 59 | Admitting: Emergency Medicine

## 2021-06-12 DIAGNOSIS — E291 Testicular hypofunction: Secondary | ICD-10-CM | POA: Diagnosis not present

## 2021-06-12 LAB — RHEUMATOID FACTOR: Rheumatoid fact SerPl-aCnc: <14 IU/mL (ref <14)

## 2021-06-12 LAB — ANTI-DNA ANTIBODY, DOUBLE-STRANDED: ds DNA Ab: <1 IU/mL

## 2021-06-12 LAB — QUANTIFERON-TB GOLD PLUS
Mitogen-NIL: >10 IU/mL
NIL: 0.04 IU/mL
QuantiFERON-TB Gold Plus: NEGATIVE
TB1-NIL: 0 IU/mL
TB2-NIL: 0 IU/mL

## 2021-06-12 LAB — CYCLIC CITRUL PEPTIDE ANTIBODY, IGG: Cyclic Citrullin Peptide Ab: 18 UNITS

## 2021-06-12 LAB — ANA: Anti Nuclear Antibody (ANA): NEGATIVE

## 2021-06-12 LAB — ANGIOTENSIN CONVERTING ENZYME: Angiotensin-Converting Enzyme: 47 U/L (ref 9–67)

## 2021-06-17 ENCOUNTER — Other Ambulatory Visit: Payer: Self-pay | Admitting: Family Medicine

## 2021-06-17 ENCOUNTER — Ambulatory Visit (INDEPENDENT_AMBULATORY_CARE_PROVIDER_SITE_OTHER): Payer: 59 | Admitting: Pulmonary Disease

## 2021-06-17 DIAGNOSIS — J8410 Pulmonary fibrosis, unspecified: Secondary | ICD-10-CM | POA: Diagnosis not present

## 2021-06-17 DIAGNOSIS — N401 Enlarged prostate with lower urinary tract symptoms: Secondary | ICD-10-CM

## 2021-06-17 LAB — PULMONARY FUNCTION TEST
DL/VA % pred: 108 %
DL/VA: 4.63 ml/min/mmHg/L
DLCO cor % pred: 52 %
DLCO cor: 16.66 ml/min/mmHg
DLCO unc % pred: 47 %
DLCO unc: 15.23 ml/min/mmHg
FEF 25-75 Post: 2.31 L/s
FEF 25-75 Pre: 1.03 L/s
FEF2575-%Change-Post: 123 %
FEF2575-%Pred-Post: 66 %
FEF2575-%Pred-Pre: 29 %
FEV1-%Change-Post: 30 %
FEV1-%Pred-Post: 64 %
FEV1-%Pred-Pre: 49 %
FEV1-Post: 2.4 L
FEV1-Pre: 1.84 L
FEV1FVC-%Change-Post: 19 %
FEV1FVC-%Pred-Pre: 80 %
FEV6-%Change-Post: 11 %
FEV6-%Pred-Post: 69 %
FEV6-%Pred-Pre: 62 %
FEV6-Post: 3.14 L
FEV6-Pre: 2.83 L
FEV6FVC-%Change-Post: 1 %
FEV6FVC-%Pred-Post: 103 %
FEV6FVC-%Pred-Pre: 101 %
FVC-%Change-Post: 9 %
FVC-%Pred-Post: 67 %
FVC-%Pred-Pre: 61 %
FVC-Post: 3.14 L
FVC-Pre: 2.88 L
Post FEV1/FVC ratio: 76 %
Post FEV6/FVC ratio: 100 %
Pre FEV1/FVC ratio: 64 %
Pre FEV6/FVC Ratio: 98 %
RV % pred: 61 %
RV: 1.45 L
TLC % pred: 56 %
TLC: 4.36 L

## 2021-06-17 NOTE — Progress Notes (Signed)
PFT done today. 

## 2021-06-18 ENCOUNTER — Telehealth: Payer: Self-pay | Admitting: *Deleted

## 2021-06-18 NOTE — Telephone Encounter (Signed)
Key: [id number] - PA Case ID: [id number] - Rx #: [id number]  Drug Testosterone Cypionate 200MG /ML intramuscular solution Form [employer] Electronic Prior Authorization Form (2017 NCPDP) Original Claim Info [address] ePA at [email or web address] [facility] Drug Requires Prior Authorization  Approvedtoday Request Reference Number: [facility]. TESTOST CYP INJ 200MG /ML is approved through 06/19/2022. Your patient may now fill this prescription and it will be covered.

## 2021-06-21 NOTE — Progress Notes (Signed)
ATC patient x1.  LVM to return call. 

## 2021-06-21 NOTE — Progress Notes (Signed)
ATC x1, LVM to return call.

## 2021-06-24 ENCOUNTER — Other Ambulatory Visit: Payer: Self-pay

## 2021-06-24 ENCOUNTER — Other Ambulatory Visit: Payer: 59

## 2021-06-24 DIAGNOSIS — N139 Obstructive and reflux uropathy, unspecified: Secondary | ICD-10-CM

## 2021-06-25 ENCOUNTER — Ambulatory Visit (INDEPENDENT_AMBULATORY_CARE_PROVIDER_SITE_OTHER): Payer: 59

## 2021-06-25 ENCOUNTER — Telehealth: Payer: Self-pay | Admitting: Adult Health

## 2021-06-25 DIAGNOSIS — E291 Testicular hypofunction: Secondary | ICD-10-CM | POA: Diagnosis not present

## 2021-06-25 LAB — BMP8+EGFR
BUN/Creatinine Ratio: 7 — ABNORMAL LOW (ref 9–20)
BUN: 8 mg/dL (ref 6–24)
CO2: 22 mmol/L (ref 20–29)
Calcium: 8.8 mg/dL (ref 8.7–10.2)
Chloride: 101 mmol/L (ref 96–106)
Creatinine, Ser: 1.2 mg/dL (ref 0.76–1.27)
Glucose: 72 mg/dL (ref 70–99)
Potassium: 4.5 mmol/L (ref 3.5–5.2)
Sodium: 137 mmol/L (ref 134–144)
eGFR: 71 mL/min/{1.73_m2} (ref >59)

## 2021-06-25 NOTE — Progress Notes (Signed)
Called and spoke with patient, provided results/recommendation per Rubye Oaks NP.  He verbalized understanding.  Nothing further needed.

## 2021-06-26 NOTE — Telephone Encounter (Signed)
Looked at PFT results and see where pt was already made aware of the results by Joline Maxcy 6/20. Nothing further needed.

## 2021-06-27 ENCOUNTER — Ambulatory Visit
Admission: RE | Admit: 2021-06-27 | Discharge: 2021-06-27 | Disposition: A | Discharge status: Home / Self Care | Payer: 59 | Source: Ambulatory Visit | Attending: Adult Health | Admitting: Adult Health

## 2021-06-27 DIAGNOSIS — J8410 Pulmonary fibrosis, unspecified: Secondary | ICD-10-CM

## 2021-06-27 DIAGNOSIS — D860 Sarcoidosis of lung: Secondary | ICD-10-CM

## 2021-07-02 ENCOUNTER — Encounter: Payer: Self-pay | Admitting: Pulmonary Disease

## 2021-07-02 ENCOUNTER — Ambulatory Visit (INDEPENDENT_AMBULATORY_CARE_PROVIDER_SITE_OTHER): Payer: 59 | Admitting: Pulmonary Disease

## 2021-07-02 VITALS — BP 116/84 | HR 94 | Temp 98.5°F | Ht 75.0 in | Wt <= 1120 oz

## 2021-07-02 DIAGNOSIS — D860 Sarcoidosis of lung: Secondary | ICD-10-CM | POA: Diagnosis not present

## 2021-07-02 DIAGNOSIS — J8410 Pulmonary fibrosis, unspecified: Secondary | ICD-10-CM

## 2021-07-02 NOTE — Progress Notes (Signed)
Luke Davila    [id number]    [birth year]  Primary Care Physician:Stacks, Broadus John, MD  Referring Physician: Mechele Claude, MD [address] [place],  [place] [address]  Chief complaint: Follow-up for interstitial lung disease  HPI: 56 y.o. with history of interstitial lung disease.  Originally seen as a consult in June 2023 with abnormal CT showing upper lung predominant changes.  This was initially noted in 2021 and was told he had sarcoidosis based on the pattern of interstitial lung disease.  He never had a biopsy done or any specific treatment given.  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 Has increasing dyspnea on exertion with flareups 2-3 times a year treated with prednisone tapers.  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  No feather pillows or comforter Smoking history: Never smoker Travel history: No significant travel his Relevant family history: No family history of lung disease  Outpatient Encounter Medications as of 07/02/2021  Medication Sig   albuterol (VENTOLIN HFA) 108 (90 Base) MCG/ACT inhaler Inhale 2 puffs into the lungs every 6 (six) hours as needed for wheezing or shortness of breath.   ARTIFICIAL TEARS 1 % ophthalmic solution Apply 1 drop to eye 2 (two) times daily as needed (dry eyes).   Budeson-Glycopyrrol-Formoterol (BREZTRI AEROSPHERE) 160-9-4.8 MCG/ACT AERO Inhale 2 puffs into the lungs 2 (two) times daily.   tamsulosin (FLOMAX) 0.4 MG CAPS capsule TAKE 2 CAPSULES (0.8 MG TOTAL) BY MOUTH DAILY AFTER SUPPER.   testosterone cypionate (DEPOTESTOSTERONE CYPIONATE) 200 MG/ML injection INJECT 1 ML (200 MG TOTAL) INTO THE MUSCLE EVERY 14 DAYS   Facility-Administered Encounter Medications as of 07/02/2021  Medication   testosterone  cypionate (DEPOTESTOSTERONE CYPIONATE) injection 200 mg   testosterone cypionate (DEPOTESTOSTERONE CYPIONATE) injection 200 mg    Allergies as of 07/02/2021   (No Known Allergies)    Past Medical History:  Diagnosis Date   Arthritis    Avascular necrosis of hip (HCC)    LEFT   Enlarged prostate    Glaucoma    Gout    History of gout    Pneumonia     Past Surgical History:  Procedure Laterality Date   COLONOSCOPY WITH PROPOFOL N/A 11/27/2015   Procedure: COLONOSCOPY WITH PROPOFOL;  Surgeon: West Bali, MD;  Location: [facility];  Service: Endoscopy;  Laterality: N/A;  1100   FOOT SURGERY Left 01/07/1983   Left hand surgery     TOTAL HIP ARTHROPLASTY Left 04/09/2015   Procedure: LEFT TOTAL HIP ARTHROPLASTY ANTERIOR APPROACH;  Surgeon: Ollen Gross, MD;  Location: [facility];  Service: Orthopedics;  Laterality: Left;   TRANSURETHRAL RESECTION OF PROSTATE N/A 03/27/2021   Procedure: BIPOLARTRANSURETHRAL RESECTION OF THE PROSTATE (TURP);  Surgeon: Rene Paci, MD;  Location: [facility];  Service: Urology;  Laterality: N/A;   WISDOM TOOTH EXTRACTION      Family History  Problem Relation Age of Onset   Hypertension Mother    Colon cancer Neg Hx     Social History   Socioeconomic History   Marital status: Divorced    Spouse name: Ramona   Number of children: 2   Years of education: 12th   Highest education level: Not on file  Occupational  History    Employer: [employer]    [employer] administrator  Tobacco Use   Smoking status: Never   Smokeless tobacco: Never  Vaping Use   Vaping Use: Never used  Substance and Sexual Activity   Alcohol use: Yes    Comment: 3 beers daily   Drug use: No   Sexual activity: Yes    Birth control/protection: None  Other Topics Concern   Not on file  Social History Narrative   Patient lives at home with his family.   Caffeine Use: 1 soda daily   Patient is right handed   Social Determinants of Health   Financial  Resource Strain: Not on file  Food Insecurity: Not on file  Transportation Needs: Not on file  Physical Activity: Not on file  Stress: Not on file  Social Connections: Not on file  Intimate Partner Violence: Not on file    Review of systems: Review of Systems  Constitutional: Negative for fever and chills.  HENT: Negative.   Eyes: Negative for blurred vision.  Respiratory: as per HPI  Cardiovascular: Negative for chest pain and palpitations.  Gastrointestinal: Negative for vomiting, diarrhea, blood per rectum. Genitourinary: Negative for dysuria, urgency, frequency and hematuria.  Musculoskeletal: Negative for myalgias, back pain and joint pain.  Skin: Negative for itching and rash.  Neurological: Negative for dizziness, tremors, focal weakness, seizures and loss of consciousness.  Endo/Heme/Allergies: Negative for environmental allergies.  Psychiatric/Behavioral: Negative for depression, suicidal ideas and hallucinations.  All other systems reviewed and are negative.  Physical Exam: Blood pressure 116/84, pulse 94, temperature 98.5 F (36.9 C), temperature source Oral, height 6\' 3"  (1.905 m), weight 25 lb 12.8 oz (11.7 kg), SpO2 98 %. Gen:      No acute distress HEENT:  EOMI, sclera anicteric Neck:     No masses; no thyromegaly Lungs:    Clear to auscultation bilaterally; normal respiratory effort CV:         Regular rate and rhythm; no murmurs Abd:      + bowel sounds; soft, non-tender; no palpable masses, no distension Ext:    No edema; adequate peripheral perfusion Skin:      Warm and dry; no rash Neuro: alert and oriented x 3 Psych: normal mood and affect  Data Reviewed: Imaging: CT high-resolution 06/13/2020-unchanged pattern of pulmonary fibrosis with upper lung predominance with peribronchovascular opacity, bronchiolectasis, enlargement Instylan hilar lymph nodes  High-resolution CT 06/28/2021-pulmonary fibrosis in similar pattern with slight worsening of fibrotic  changes.  Hilar, mediastinal lymphadenopathy I reviewed the images personally  PFTs: 06/17/2021 FVC 3.14 [69%], FEV1 2.40 [64%], F/F 76, TLC 4.36 [56%], DLCO 15.23 [47%] Moderate obstruction with bronchodilator response, severe restriction and diffusion impairment  Labs: CTD serologies 06/10/2021-negative Angiotensin-converting enzyme 06/10/2021- 47  Assessment:  Abnormal CT, interstitial lung disease His CT findings are in pattern suggestive of sarcoidosis.  Given progression he will need to be initiated on treatment.  We would like to get diagnosis first with tissue  Discussed bronchoscope with endobronchial ultrasound and biopsy.  He is unsure of the cause 20 forced goes through with that.  He is going to talk to his insurance company about it forced I will schedule him tentatively in the next few weeks for the procedure.  Obstructive airway disease Unlikely to be COPD as he is a non-smoker.  He could have asthma versus airway involvement of sarcoid He is doing well on breztri and will continue the same  Plan/Recommendations: Schedule bronchoscope with endobronchial ultrasound biopsy  Continue breztri  Chilton Greathouse MD [facility] 07/02/2021, 3:30 PM  CC: Mechele Claude, MD

## 2021-07-03 ENCOUNTER — Encounter: Payer: Self-pay | Admitting: Family Medicine

## 2021-07-03 ENCOUNTER — Institutional Professional Consult (permissible substitution): Payer: Self-pay | Admitting: Pulmonary Disease

## 2021-07-08 ENCOUNTER — Encounter: Payer: Self-pay | Admitting: Pulmonary Disease

## 2021-07-08 NOTE — Progress Notes (Signed)
Attempted to obtain medical history via telephone, unable to reach at this time. HIPAA compliant voicemail message left requesting return call to pre surgical testing department. 

## 2021-07-10 ENCOUNTER — Encounter (HOSPITAL_COMMUNITY): Payer: Self-pay | Admitting: Pulmonary Disease

## 2021-07-10 ENCOUNTER — Other Ambulatory Visit: Payer: 59

## 2021-07-10 ENCOUNTER — Ambulatory Visit (INDEPENDENT_AMBULATORY_CARE_PROVIDER_SITE_OTHER): Payer: 59

## 2021-07-10 ENCOUNTER — Other Ambulatory Visit: Payer: Self-pay

## 2021-07-10 DIAGNOSIS — E291 Testicular hypofunction: Secondary | ICD-10-CM | POA: Diagnosis not present

## 2021-07-10 DIAGNOSIS — N139 Obstructive and reflux uropathy, unspecified: Secondary | ICD-10-CM

## 2021-07-10 NOTE — Progress Notes (Signed)
Testosterone injection given to left upper outer quadrant.  Patient tolerated well. 

## 2021-07-11 LAB — BMP8+EGFR
BUN/Creatinine Ratio: 8 — ABNORMAL LOW (ref 9–20)
BUN: 12 mg/dL (ref 6–24)
CO2: 23 mmol/L (ref 20–29)
Calcium: 9.3 mg/dL (ref 8.7–10.2)
Chloride: 102 mmol/L (ref 96–106)
Creatinine, Ser: 1.56 mg/dL — ABNORMAL HIGH (ref 0.76–1.27)
Glucose: 82 mg/dL (ref 70–99)
Potassium: 4.5 mmol/L (ref 3.5–5.2)
Sodium: 139 mmol/L (ref 134–144)
eGFR: 52 mL/min/{1.73_m2} — ABNORMAL LOW (ref >59)

## 2021-07-15 ENCOUNTER — Other Ambulatory Visit: Payer: Self-pay | Admitting: Pulmonary Disease

## 2021-07-15 LAB — SARS CORONAVIRUS 2 (TAT 6-24 HRS): SARS Coronavirus 2: NEGATIVE

## 2021-07-16 NOTE — Anesthesia Preprocedure Evaluation (Signed)
Anesthesia Evaluation  Patient identified by MRN, date of birth, ID band Patient awake    Reviewed: Allergy & Precautions, NPO status , Patient's Chart, lab work & pertinent test results  Airway Mallampati: II  TM Distance: >3 FB Neck ROM: Full    Dental  (+) Teeth Intact, Dental Advisory Given   Pulmonary neg pulmonary ROS,  SARCOIDOSIS OF LUNG   Pulmonary exam normal breath sounds clear to auscultation       Cardiovascular negative cardio ROS Normal cardiovascular exam Rhythm:Regular Rate:Normal     Neuro/Psych negative neurological ROS     GI/Hepatic negative GI ROS, Neg liver ROS,   Endo/Other  negative endocrine ROS  Renal/GU Renal disease     Musculoskeletal  (+) Arthritis ,   Abdominal   Peds  Hematology negative hematology ROS (+)   Anesthesia Other Findings   Reproductive/Obstetrics                            Anesthesia Physical Anesthesia Plan  ASA: 2  Anesthesia Plan: General   Post-op Pain Management: Tylenol PO (pre-op)*   Induction: Intravenous  PONV Risk Score and Plan: 2 and Midazolam, Dexamethasone and Ondansetron  Airway Management Planned: Oral ETT  Additional Equipment:   Intra-op Plan:   Post-operative Plan: Extubation in OR  Informed Consent: I have reviewed the patients History and Physical, chart, labs and discussed the procedure including the risks, benefits and alternatives for the proposed anesthesia with the patient or authorized representative who has indicated his/her understanding and acceptance.     Dental advisory given  Plan Discussed with: CRNA  Anesthesia Plan Comments:        Anesthesia Quick Evaluation

## 2021-07-17 ENCOUNTER — Encounter (HOSPITAL_COMMUNITY)
Admission: RE | Disposition: A | Discharge status: Home / Self Care | Payer: Self-pay | Source: Ambulatory Visit | Attending: Pulmonary Disease

## 2021-07-17 ENCOUNTER — Ambulatory Visit (HOSPITAL_BASED_OUTPATIENT_CLINIC_OR_DEPARTMENT_OTHER): Payer: 59 | Admitting: Anesthesiology

## 2021-07-17 ENCOUNTER — Other Ambulatory Visit: Payer: Self-pay

## 2021-07-17 ENCOUNTER — Encounter (HOSPITAL_COMMUNITY): Payer: Self-pay | Admitting: Pulmonary Disease

## 2021-07-17 ENCOUNTER — Ambulatory Visit (HOSPITAL_COMMUNITY): Payer: 59 | Admitting: Anesthesiology

## 2021-07-17 ENCOUNTER — Ambulatory Visit (HOSPITAL_COMMUNITY): Payer: 59

## 2021-07-17 ENCOUNTER — Ambulatory Visit (HOSPITAL_COMMUNITY)
Admission: RE | Admit: 2021-07-17 | Discharge: 2021-07-17 | Disposition: A | Discharge status: Home / Self Care | Payer: 59 | Source: Ambulatory Visit | Attending: Pulmonary Disease | Admitting: Pulmonary Disease

## 2021-07-17 DIAGNOSIS — D869 Sarcoidosis, unspecified: Secondary | ICD-10-CM

## 2021-07-17 DIAGNOSIS — D8689 Sarcoidosis of other sites: Secondary | ICD-10-CM | POA: Diagnosis not present

## 2021-07-17 DIAGNOSIS — R9389 Abnormal findings on diagnostic imaging of other specified body structures: Secondary | ICD-10-CM | POA: Insufficient documentation

## 2021-07-17 DIAGNOSIS — Z7951 Long term (current) use of inhaled steroids: Secondary | ICD-10-CM | POA: Insufficient documentation

## 2021-07-17 DIAGNOSIS — D860 Sarcoidosis of lung: Secondary | ICD-10-CM | POA: Insufficient documentation

## 2021-07-17 DIAGNOSIS — R918 Other nonspecific abnormal finding of lung field: Secondary | ICD-10-CM | POA: Insufficient documentation

## 2021-07-17 DIAGNOSIS — R0600 Dyspnea, unspecified: Secondary | ICD-10-CM | POA: Diagnosis not present

## 2021-07-17 HISTORY — PX: FINE NEEDLE ASPIRATION: SHX5430

## 2021-07-17 HISTORY — PX: ENDOBRONCHIAL ULTRASOUND: SHX5096

## 2021-07-17 HISTORY — PX: VIDEO BRONCHOSCOPY: SHX5072

## 2021-07-17 SURGERY — VIDEO BRONCHOSCOPY WITHOUT FLUORO
Anesthesia: General | Laterality: Bilateral

## 2021-07-17 MED ORDER — SUGAMMADEX SODIUM 500 MG/5ML IV SOLN
INTRAVENOUS | Status: DC | PRN
  Administered 2021-07-17: 250 mg via INTRAVENOUS

## 2021-07-17 MED ORDER — FENTANYL CITRATE (PF) 100 MCG/2ML IJ SOLN
INTRAMUSCULAR | Status: DC | PRN
  Administered 2021-07-17: 25 ug via INTRAVENOUS
  Administered 2021-07-17: 75 ug via INTRAVENOUS

## 2021-07-17 MED ORDER — LIDOCAINE 2% (20 MG/ML) 5 ML SYRINGE
INTRAMUSCULAR | Status: DC | PRN
  Administered 2021-07-17: 80 mg via INTRAVENOUS

## 2021-07-17 MED ORDER — ONDANSETRON HCL 4 MG/2ML IJ SOLN: 4.0000 mg | Freq: Once | INTRAMUSCULAR | Status: DC | PRN

## 2021-07-17 MED ORDER — LACTATED RINGERS IV SOLN: INTRAVENOUS | Status: DC | PRN

## 2021-07-17 MED ORDER — PROPOFOL 10 MG/ML IV BOLUS
INTRAVENOUS | Status: DC | PRN
  Administered 2021-07-17: 180 mg via INTRAVENOUS

## 2021-07-17 MED ORDER — PROPOFOL 10 MG/ML IV BOLUS
INTRAVENOUS | Status: AC
  Filled 2021-07-17: qty 20

## 2021-07-17 MED ORDER — MIDAZOLAM HCL 2 MG/2ML IJ SOLN
INTRAMUSCULAR | Status: DC | PRN
  Administered 2021-07-17: 2 mg via INTRAVENOUS

## 2021-07-17 MED ORDER — ONDANSETRON HCL 4 MG/2ML IJ SOLN
INTRAMUSCULAR | Status: DC | PRN
  Administered 2021-07-17: 4 mg via INTRAVENOUS

## 2021-07-17 MED ORDER — ROCURONIUM BROMIDE 10 MG/ML (PF) SYRINGE
PREFILLED_SYRINGE | INTRAVENOUS | Status: DC | PRN
  Administered 2021-07-17: 60 mg via INTRAVENOUS

## 2021-07-17 MED ORDER — PHENYLEPHRINE HCL-NACL 20-0.9 MG/250ML-% IV SOLN
INTRAVENOUS | Status: DC | PRN
  Administered 2021-07-17: 50 ug/min via INTRAVENOUS

## 2021-07-17 MED ORDER — PHENYLEPHRINE 80 MCG/ML (10ML) SYRINGE FOR IV PUSH (FOR BLOOD PRESSURE SUPPORT)
PREFILLED_SYRINGE | INTRAVENOUS | Status: DC | PRN
  Administered 2021-07-17 (×4): 160 ug via INTRAVENOUS

## 2021-07-17 MED ORDER — FENTANYL CITRATE (PF) 100 MCG/2ML IJ SOLN
INTRAMUSCULAR | Status: AC
  Filled 2021-07-17: qty 2

## 2021-07-17 MED ORDER — ACETAMINOPHEN 500 MG PO TABS
ORAL_TABLET | ORAL | Status: AC
  Filled 2021-07-17: qty 2

## 2021-07-17 MED ORDER — DEXAMETHASONE SODIUM PHOSPHATE 10 MG/ML IJ SOLN
INTRAMUSCULAR | Status: DC | PRN
  Administered 2021-07-17: 10 mg via INTRAVENOUS

## 2021-07-17 MED ORDER — MIDAZOLAM HCL 2 MG/2ML IJ SOLN
INTRAMUSCULAR | Status: AC
  Filled 2021-07-17: qty 2

## 2021-07-17 MED ORDER — ACETAMINOPHEN 500 MG PO TABS
1000.0000 mg | ORAL_TABLET | Freq: Once | ORAL | Status: AC
  Administered 2021-07-17: 1000 mg via ORAL

## 2021-07-17 MED ORDER — FENTANYL CITRATE (PF) 100 MCG/2ML IJ SOLN: 25.0000 ug | INTRAMUSCULAR | Status: DC | PRN

## 2021-07-17 NOTE — Anesthesia Procedure Notes (Signed)
Procedure Name: Intubation Date/Time: 07/17/2021 9:04 AM  Performed by: Florene Route, CRNAPre-anesthesia Checklist: Patient identified, Emergency Drugs available, Suction available and Patient being monitored Patient Re-evaluated:Patient Re-evaluated prior to induction Oxygen Delivery Method: Circle system utilized Preoxygenation: Pre-oxygenation with 100% oxygen Induction Type: IV induction Ventilation: Mask ventilation without difficulty Laryngoscope Size: Miller and 3 Grade View: Grade I Tube type: Oral Tube size: 9.0 mm Number of attempts: 1 Airway Equipment and Method: Stylet and Oral airway Placement Confirmation: ETT inserted through vocal cords under direct vision, positive ETCO2 and breath sounds checked- equal and bilateral Secured at: 23 cm Tube secured with: Tape Dental Injury: Teeth and Oropharynx as per pre-operative assessment

## 2021-07-17 NOTE — H&P (Signed)
Luke Davila    [id number]    1965-03-23  Primary Care Physician:Stacks, Broadus John, MD  Referring Physician: No referring provider defined for this encounter.  Chief complaint: Follow-up for interstitial lung disease  HPI: 56 y.o. with history of interstitial lung disease.  Originally seen as a consult in June 2023 with abnormal CT showing upper lung predominant changes.  This was initially noted in 2021 and was told he had sarcoidosis based on the pattern of interstitial lung disease.  He never had a biopsy done or any specific treatment given.  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 Has increasing dyspnea on exertion with flareups 2-3 times a year treated with prednisone tapers.  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  No feather pillows or comforter Smoking history: Never smoker Travel history: No significant travel his Relevant family history: No family history of lung disease  Interim History: Patient presents to hospital for bronchoscopy procedure. No new complaints.  States that breathing is stable  Outpatient Encounter Medications as of 07/02/2021  Medication Sig   albuterol (VENTOLIN HFA) 108 (90 Base) MCG/ACT inhaler Inhale 2 puffs into the lungs every 6 (six) hours as needed for wheezing or shortness of breath.   ARTIFICIAL TEARS 1 % ophthalmic solution Apply 1 drop to eye 2 (two) times daily as needed (dry eyes).   Budeson-Glycopyrrol-Formoterol (BREZTRI AEROSPHERE) 160-9-4.8 MCG/ACT AERO Inhale 2 puffs into the lungs 2 (two) times daily.   tamsulosin (FLOMAX) 0.4 MG CAPS capsule TAKE 2 CAPSULES (0.8 MG TOTAL) BY MOUTH DAILY AFTER SUPPER.   testosterone cypionate (DEPOTESTOSTERONE CYPIONATE) 200 MG/ML injection INJECT 1 ML (200 MG TOTAL)  INTO THE MUSCLE EVERY 14 DAYS   Physical Exam: Blood pressure (!) 145/91, pulse 78, temperature 97.8 F (36.6 C), temperature source Temporal, resp. rate 20, height 6\' 3"  (1.905 m), weight 102.1 kg, SpO2 100 %. Gen:      No acute distress HEENT:  EOMI, sclera anicteric Neck:     No masses; no thyromegaly Lungs:    Clear to auscultation bilaterally; normal respiratory effort CV:         Regular rate and rhythm; no murmurs Abd:      + bowel sounds; soft, non-tender; no palpable masses, no distension Ext:    No edema; adequate peripheral perfusion Skin:      Warm and dry; no rash Neuro: alert and oriented x 3 Psych: normal mood and affect   Data Reviewed: Imaging: CT high-resolution 06/13/2020-unchanged pattern of pulmonary fibrosis with upper lung predominance with peribronchovascular opacity, bronchiolectasis, enlargement Instylan hilar lymph nodes  High-resolution CT 06/28/2021-pulmonary fibrosis in similar pattern with slight worsening of fibrotic changes.  Hilar, mediastinal lymphadenopathy I reviewed the images personally  PFTs: 06/17/2021 FVC 3.14 [69%], FEV1 2.40 [64%], F/F 76, TLC 4.36 [56%], DLCO 15.23 [47%] Moderate obstruction with bronchodilator response, severe restriction and diffusion impairment  Labs: CTD serologies 06/10/2021-negative Angiotensin-converting enzyme 06/10/2021- 47  Assessment:  Abnormal CT, interstitial lung disease His CT findings are in pattern suggestive of sarcoidosis.  Given progression he will need to be initiated on treatment.  We would like to get diagnosis first with tissue  Discussed bronchoscope with endobronchial ultrasound and biopsy.  Patient has consented for  the procedure   Plan/Recommendations: Bronchoscope with endobronchial ultrasound biopsy  Chilton Greathouse MD [facility] 07/17/2021, 8:48 AM  CC: No ref. provider found

## 2021-07-17 NOTE — Op Note (Signed)
[facility] Cardiopulmonary Patient Name: Luke Davila Procedure Date: 07/17/2021 MRN: [id number] Attending MD: Chilton Greathouse , MD Date of Birth: 05-23-1965 CSN: [id number] Age: 56 Admit Type: Outpatient Ethnicity: Not Hispanic or Latino Procedure:             Bronchoscopy Indications:           Abnormal CT scan of chest, Bilateral hilar/mediastinal                         abnormality Providers:             Chilton Greathouse, MD, Zoe Lan, RN, Alan Ripper                         Technician, Technician Referring MD:           Medicines:             General Anesthesia Complications:         No immediate complications Estimated Blood Loss:  Estimated blood loss was minimal. Procedure:      Pre-Anesthesia Assessment:      - A History and Physical has been performed. Patient meds and allergies       have been reviewed. The risks and benefits of the procedure and the       sedation options and risks were discussed with the patient's spouse. All       questions were answered and informed consent was obtained. Patient       identification and proposed procedure were verified prior to the       procedure by the physician in the pre-procedure area. Mental Status       Examination: alert and oriented. Respiratory Examination: clear to       auscultation. CV Examination: RRR, no murmurs, no S3 or S4. ASA Grade       Assessment: II - A patient with mild systemic disease. After reviewing       the risks and benefits, the patient was deemed in satisfactory condition       to undergo the procedure. The anesthesia plan was to use minimal       sedation / analgesia (anxiolysis). Immediately prior to administration       of medications, the patient was re-assessed for adequacy to receive       sedatives. The heart rate, respiratory rate, oxygen saturations, blood       pressure, adequacy of pulmonary ventilation, and response to care were       monitored throughout the procedure.  The physical status of the patient       was re-assessed after the procedure.      After obtaining informed consent, the bronchoscope was passed under       direct vision. Throughout the procedure, the patient's blood pressure,       pulse, and oxygen saturations were monitored continuously. the [id number]       ([id number]) Olympus bronoscope was introduced through the mouth, via the       endotracheal tube (the patient was intubated for the procedure) and       advanced to the tracheobronchial tree of both lungs. the Bronchoscope       was introduced through the and advanced to the. Findings:      The endotracheal tube is in good position. The visualized portion of the       trachea is of normal  caliber. The carina is sharp. The tracheobronchial       tree was examined to at least the first subsegmental level. Bronchial       mucosa and anatomy are normal; there are no endobronchial lesions, and       no secretions.      An endobronchial ultrasound endoscope was utilized in the subcarinal       area and in the right hilum. Impression:      - Abnormal CT scan of chest      - Bilateral hilar/mediastinal abnormality      - The airway examination was normal.      - Endobronchial ultrasound was performed.      - No specimens collected. Moderate Sedation:      None Recommendation:      - Await biopsy results. Procedure Code(s):      --- Professional ---      [phone], Bronchoscopy, rigid or flexible, including fluoroscopic guidance,       when performed; diagnostic, with cell washing, when performed (separate       procedure)      31654, Bronchoscopy, rigid or flexible, including fluoroscopic guidance,       when performed; with transendoscopic endobronchial ultrasound (EBUS)       during bronchoscopic diagnostic or therapeutic intervention(s) for       peripheral lesion(s) (List separately in addition to code for primary       procedure[s]) Diagnosis Code(s):      --- Professional ---       R93.89, Abnormal findings on diagnostic imaging of other specified body       structures CPT copyright 2019 American Medical Association. All rights reserved. The codes documented in this report are preliminary and upon coder review may  be revised to meet current compliance requirements. Chilton Greathouse, MD Chilton Greathouse, MD 07/17/2021 10:13:57 AM Number of Addenda: 0 Scope In: Scope Out:

## 2021-07-17 NOTE — Transfer of Care (Signed)
Immediate Anesthesia Transfer of Care Note  Patient: Luke Davila  Procedure(s) Performed: VIDEO BRONCHOSCOPY WITH FLUORO ENDOBRONCHIAL ULTRASOUND (Bilateral) FINE NEEDLE ASPIRATION (FNA) LINEAR  Patient Location: Endoscopy Unit  Anesthesia Type:General  Level of Consciousness: awake and alert   Airway & Oxygen Therapy: Patient Spontanous Breathing and Patient connected to face mask oxygen  Post-op Assessment: Report given to RN and Post -op Vital signs reviewed and stable  Post vital signs: Reviewed and stable  Last Vitals:  Vitals Value Taken Time  BP    Temp    Pulse    Resp    SpO2      Last Pain:  Vitals:   07/17/21 0823  TempSrc: Temporal  PainSc: 0-No pain         Complications: No notable events documented.

## 2021-07-18 ENCOUNTER — Encounter (HOSPITAL_COMMUNITY): Payer: Self-pay | Admitting: Pulmonary Disease

## 2021-07-18 NOTE — Anesthesia Postprocedure Evaluation (Signed)
Anesthesia Post Note  Patient: Luke Davila  Procedure(s) Performed: ENDOBRONCHIAL ULTRASOUND (Bilateral) FINE NEEDLE ASPIRATION (FNA) LINEAR VIDEO BRONCHOSCOPY WITHOUT FLUORO     Patient location during evaluation: Endoscopy Anesthesia Type: General Level of consciousness: awake and alert Pain management: pain level controlled Vital Signs Assessment: post-procedure vital signs reviewed and stable Respiratory status: spontaneous breathing, nonlabored ventilation, respiratory function stable and patient connected to nasal cannula oxygen Cardiovascular status: blood pressure returned to baseline and stable Postop Assessment: no apparent nausea or vomiting Anesthetic complications: no   No notable events documented.  Last Vitals:  Vitals:   07/17/21 1040 07/17/21 1050  BP: (!) 130/97 132/79  Pulse: 77 75  Resp: (!) 21 18  Temp:    SpO2: 100% 95%    Last Pain:  Vitals:   07/17/21 1050  TempSrc:   PainSc: 0-No pain                 Collene Schlichter

## 2021-07-22 ENCOUNTER — Other Ambulatory Visit: Payer: 59

## 2021-07-22 ENCOUNTER — Other Ambulatory Visit: Payer: Self-pay | Admitting: *Deleted

## 2021-07-22 DIAGNOSIS — N179 Acute kidney failure, unspecified: Secondary | ICD-10-CM

## 2021-07-22 LAB — BMP8+EGFR
BUN/Creatinine Ratio: 8 — ABNORMAL LOW (ref 9–20)
BUN: 10 mg/dL (ref 6–24)
CO2: 24 mmol/L (ref 20–29)
Calcium: 9 mg/dL (ref 8.7–10.2)
Chloride: 100 mmol/L (ref 96–106)
Creatinine, Ser: 1.32 mg/dL — ABNORMAL HIGH (ref 0.76–1.27)
Glucose: 78 mg/dL (ref 70–99)
Potassium: 4.2 mmol/L (ref 3.5–5.2)
Sodium: 136 mmol/L (ref 134–144)
eGFR: 64 mL/min/{1.73_m2} (ref >59)

## 2021-07-23 ENCOUNTER — Telehealth: Payer: Self-pay | Admitting: Pulmonary Disease

## 2021-07-23 NOTE — Telephone Encounter (Signed)
Left message for patient to call back  

## 2021-07-24 ENCOUNTER — Ambulatory Visit (INDEPENDENT_AMBULATORY_CARE_PROVIDER_SITE_OTHER): Payer: 59 | Admitting: *Deleted

## 2021-07-24 DIAGNOSIS — E291 Testicular hypofunction: Secondary | ICD-10-CM

## 2021-07-24 LAB — AEROBIC/ANAEROBIC CULTURE W GRAM STAIN (SURGICAL/DEEP WOUND)
Culture: NO GROWTH
Gram Stain: NONE SEEN

## 2021-07-24 NOTE — Progress Notes (Signed)
Hello Helio,  Your lab result is normal and/or stable.Some minor variations that are not significant are commonly marked abnormal, but do not represent any medical problem for you.  Best regards, Lola Lofaro, M.D.

## 2021-07-24 NOTE — Progress Notes (Signed)
Patient in today for bi-weekly testosterone injection. 200 mg administered IM in right upper outer quadrant. Patient tolerated well.

## 2021-07-25 LAB — CYTOLOGY - NON PAP

## 2021-07-26 ENCOUNTER — Encounter: Payer: Self-pay | Admitting: Pulmonary Disease

## 2021-07-26 ENCOUNTER — Other Ambulatory Visit: Payer: Self-pay | Admitting: Pulmonary Disease

## 2021-07-26 MED ORDER — PREDNISONE 10 MG PO TABS: 40.0000 mg | ORAL_TABLET | Freq: Every day | ORAL | 0 refills | Status: DC

## 2021-07-26 NOTE — Progress Notes (Signed)
Reviewed endobronchial ultrasound lymph node biopsy results dated 07/17/2021 showed granulomatous inflammation Findings are consistent with sarcoid  I called and discussed with patient.  Prednisone 40 mg a day called in to his pharmacy.  Return clinic visit on 08/08/2018

## 2021-07-30 LAB — ACID FAST SMEAR (AFB, MYCOBACTERIA): Acid Fast Smear: NEGATIVE

## 2021-08-05 ENCOUNTER — Other Ambulatory Visit: Payer: 59

## 2021-08-05 ENCOUNTER — Other Ambulatory Visit: Payer: Self-pay | Admitting: *Deleted

## 2021-08-05 DIAGNOSIS — N179 Acute kidney failure, unspecified: Secondary | ICD-10-CM

## 2021-08-07 ENCOUNTER — Other Ambulatory Visit: Payer: Self-pay | Admitting: Pulmonary Disease

## 2021-08-07 ENCOUNTER — Ambulatory Visit (INDEPENDENT_AMBULATORY_CARE_PROVIDER_SITE_OTHER): Payer: 59 | Admitting: Pulmonary Disease

## 2021-08-07 ENCOUNTER — Ambulatory Visit (INDEPENDENT_AMBULATORY_CARE_PROVIDER_SITE_OTHER): Payer: 59 | Admitting: *Deleted

## 2021-08-07 ENCOUNTER — Encounter: Payer: Self-pay | Admitting: Pulmonary Disease

## 2021-08-07 VITALS — BP 146/76 | HR 73 | Temp 97.9°F | Ht 75.0 in | Wt 231.0 lb

## 2021-08-07 DIAGNOSIS — J8410 Pulmonary fibrosis, unspecified: Secondary | ICD-10-CM | POA: Diagnosis not present

## 2021-08-07 DIAGNOSIS — D860 Sarcoidosis of lung: Secondary | ICD-10-CM | POA: Diagnosis not present

## 2021-08-07 DIAGNOSIS — E291 Testicular hypofunction: Secondary | ICD-10-CM | POA: Diagnosis not present

## 2021-08-07 MED ORDER — METHOTREXATE SODIUM 5 MG PO TABS: 5.0000 mg | ORAL_TABLET | ORAL | 5 refills | Status: DC

## 2021-08-07 MED ORDER — FOLIC ACID 1 MG PO TABS: 1.0000 mg | ORAL_TABLET | Freq: Every day | ORAL | 5 refills | Status: DC

## 2021-08-07 MED ORDER — SULFAMETHOXAZOLE-TRIMETHOPRIM 800-160 MG PO TABS: 1.0000 | ORAL_TABLET | ORAL | 5 refills | Status: DC

## 2021-08-07 NOTE — Progress Notes (Signed)
Luke Davila    [id number]    1965/12/04  Primary Care Physician:Stacks, Broadus John, MD  Referring Physician: Mechele Claude, MD [address] [place],  [place] [address]  Chief complaint: Follow-up for interstitial lung disease  HPI: 56 y.o. with history of interstitial lung disease.  Originally seen as a consult in June 2023 with abnormal CT showing upper lung predominant changes.  This was initially noted in 2021 and was told he had sarcoidosis based on the pattern of interstitial lung disease.  He never had a biopsy done or any specific treatment given.  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 Has increasing dyspnea on exertion with flareups 2-3 times a year treated with prednisone tapers.  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  No feather pillows or comforter Smoking history: Never smoker Travel history: No significant travel his Relevant family history: No family history of lung disease  Interim history: Underwent bronchoscopy with endobronchial ultrasound of lymph nodes on 7/12 with findings of granulomatous inflammation consistent with sarcoid.  Cultures are negative.  There is no evidence of malignancy He has been started on prednisone at 40 mg a day at the end of July 2023 and feels that that is helping with his cough and chest tightness.  Outpatient Encounter Medications as of 08/07/2021  Medication Sig   albuterol (VENTOLIN HFA) 108 (90 Base) MCG/ACT inhaler Inhale 2 puffs into the lungs every 6 (six) hours as needed for wheezing or shortness of breath.   ARTIFICIAL TEARS 1 % ophthalmic solution Apply 1 drop to eye 2 (two) times daily as needed (dry eyes).   Budeson-Glycopyrrol-Formoterol (BREZTRI AEROSPHERE) 160-9-4.8 MCG/ACT AERO Inhale 2 puffs  into the lungs 2 (two) times daily.   Multiple Vitamins-Minerals (MULTIVITAMIN WITH MINERALS) tablet Take 1 tablet by mouth daily.   predniSONE (DELTASONE) 10 MG tablet Take 4 tablets (40 mg total) by mouth daily with breakfast.   testosterone cypionate (DEPOTESTOSTERONE CYPIONATE) 200 MG/ML injection INJECT 1 ML (200 MG TOTAL) INTO THE MUSCLE EVERY 14 DAYS   tamsulosin (FLOMAX) 0.4 MG CAPS capsule TAKE 2 CAPSULES (0.8 MG TOTAL) BY MOUTH DAILY AFTER SUPPER. (Patient not taking: Reported on 08/07/2021)   Facility-Administered Encounter Medications as of 08/07/2021  Medication   testosterone cypionate (DEPOTESTOSTERONE CYPIONATE) injection 200 mg   testosterone cypionate (DEPOTESTOSTERONE CYPIONATE) injection 200 mg    Physical Exam: Blood pressure (!) 146/76, pulse 73, temperature 97.9 F (36.6 C), temperature source Oral, height 6\' 3"  (1.905 m), weight 231 lb (104.8 kg), SpO2 99 %. Gen:      No acute distress HEENT:  EOMI, sclera anicteric Neck:     No masses; no thyromegaly Lungs:    Clear to auscultation bilaterally; normal respiratory effort CV:         Regular rate and rhythm; no murmurs Abd:      + bowel sounds; soft, non-tender; no palpable masses, no distension Ext:    No edema; adequate peripheral perfusion Skin:      Warm and dry; no rash Neuro: alert and oriented x 3 Psych: normal mood and affect   Data Reviewed: Imaging: CT high-resolution 06/13/2020-unchanged pattern of pulmonary fibrosis with upper lung predominance with peribronchovascular opacity, bronchiolectasis, enlargement Instylan hilar  lymph nodes  High-resolution CT 06/28/2021-pulmonary fibrosis in similar pattern with slight worsening of fibrotic changes.  Hilar, mediastinal lymphadenopathy I reviewed the images personally  PFTs: 06/17/2021 FVC 3.14 [69%], FEV1 2.40 [64%], F/F 76, TLC 4.36 [56%], DLCO 15.23 [47%] Moderate obstruction with bronchodilator response, severe restriction and diffusion  impairment  Labs: CTD serologies 06/10/2021-negative Angiotensin-converting enzyme 06/10/2021- 47  Bronchoscopy with endobronchial ultrasound biopsy 07/17/2021 Station 7 lymph node biopsy with granulomatous inflammation.  Lymph node cultures negative to date  Assessment:  Sarcoidosis CT findings are suggestive of sarcoidosis and lymph node biopsy by endobronchial ultrasound confirms granulomatous inflammation. He is currently on prednisone and will also start methotrexate as he will require long-term therapy given severity of disease, fibrotic changes and also presence of ocular sarcoid.  Start folic acid supplementation and Bactrim prophylaxis We will start tapering prednisone in 1 to 2 months once we have him on a good dose of methotrexate  He continues follow-up with ophthalmology Check LFTs EKG today reviewed with changes of bundle branch block.  Referred to cardiology for further evaluation   Obstructive airway disease Unlikely to be COPD as he is a non-smoker.  He could have asthma versus airway involvement of sarcoid He is doing well on breztri and will continue the same  Plan/Recommendations: Continue breztri inhaler Prednisone 40 mg a day for now.  Start Bactrim Start methotrexate 5 mg/week and folic acid supplementation Check comprehensive metabolic panel Cardiology referral  Chilton Greathouse MD [facility] 08/07/2021, 3:46 PM  CC: Mechele Claude, MD

## 2021-08-07 NOTE — Patient Instructions (Signed)
Check an EKG today, Check labs including comprehensive metabolic panel, CBC with differential Start methotrexate 5 mg a week, folic acid 1 mg daily Start Bactrim double strength 3 times a week Continue prednisone at current dose Follow-up in 1-2

## 2021-08-08 ENCOUNTER — Telehealth: Payer: Self-pay | Admitting: Pulmonary Disease

## 2021-08-08 LAB — CBC WITH DIFFERENTIAL/PLATELET
Basophils Absolute: 0 10*3/uL (ref 0.0–0.1)
Basophils Relative: 0.4 % (ref 0.0–3.0)
Eosinophils Absolute: 0 10*3/uL (ref 0.0–0.7)
Eosinophils Relative: 0.3 % (ref 0.0–5.0)
HCT: 38.2 % — ABNORMAL LOW (ref 39.0–52.0)
Hemoglobin: 12.3 g/dL — ABNORMAL LOW (ref 13.0–17.0)
Lymphocytes Relative: 14.8 % (ref 12.0–46.0)
Lymphs Abs: 1 10*3/uL (ref 0.7–4.0)
MCHC: 32.3 g/dL (ref 30.0–36.0)
MCV: 78.2 fl (ref 78.0–100.0)
Monocytes Absolute: 0.2 10*3/uL (ref 0.1–1.0)
Monocytes Relative: 3.2 % (ref 3.0–12.0)
Neutro Abs: 5.4 10*3/uL (ref 1.4–7.7)
Neutrophils Relative %: 81.3 % — ABNORMAL HIGH (ref 43.0–77.0)
Platelets: 270 10*3/uL (ref 150.0–400.0)
RBC: 4.88 Mil/uL (ref 4.22–5.81)
RDW: 15.3 % (ref 11.5–15.5)
WBC: 6.6 10*3/uL (ref 4.0–10.5)

## 2021-08-08 LAB — COMPREHENSIVE METABOLIC PANEL
ALT: 19 U/L (ref 0–53)
AST: 19 U/L (ref 0–37)
Albumin: 3.9 g/dL (ref 3.5–5.2)
Alkaline Phosphatase: 106 U/L (ref 39–117)
BUN: 18 mg/dL (ref 6–23)
CO2: 29 mEq/L (ref 19–32)
Calcium: 8.8 mg/dL (ref 8.4–10.5)
Chloride: 102 mEq/L (ref 96–112)
Creatinine, Ser: 1.49 mg/dL (ref 0.40–1.50)
GFR: 52.35 mL/min — ABNORMAL LOW (ref >60.00)
Glucose, Bld: 84 mg/dL (ref 70–99)
Potassium: 4 mEq/L (ref 3.5–5.1)
Sodium: 138 mEq/L (ref 135–145)
Total Bilirubin: 0.7 mg/dL (ref 0.2–1.2)
Total Protein: 7 g/dL (ref 6.0–8.3)

## 2021-08-08 NOTE — Telephone Encounter (Signed)
Called and spoke with pt's spouse letting her know that while pt is on the meds prescribed, pt will be closely monitored having bloodwork performed and she verbalized understanding. Nothing further needed.

## 2021-08-14 ENCOUNTER — Ambulatory Visit: Payer: 59 | Admitting: Family Medicine

## 2021-08-16 LAB — FUNGUS CULTURE WITH STAIN

## 2021-08-16 LAB — FUNGAL ORGANISM REFLEX

## 2021-08-16 LAB — FUNGUS CULTURE RESULT

## 2021-08-19 ENCOUNTER — Ambulatory Visit (INDEPENDENT_AMBULATORY_CARE_PROVIDER_SITE_OTHER): Payer: 59 | Admitting: Family Medicine

## 2021-08-19 ENCOUNTER — Encounter: Payer: Self-pay | Admitting: Family Medicine

## 2021-08-19 VITALS — BP 158/89 | HR 70 | Temp 97.8°F | Ht 75.0 in | Wt 237.0 lb

## 2021-08-19 DIAGNOSIS — N400 Enlarged prostate without lower urinary tract symptoms: Secondary | ICD-10-CM | POA: Diagnosis not present

## 2021-08-19 DIAGNOSIS — E291 Testicular hypofunction: Secondary | ICD-10-CM | POA: Diagnosis not present

## 2021-08-19 DIAGNOSIS — D860 Sarcoidosis of lung: Secondary | ICD-10-CM

## 2021-08-19 DIAGNOSIS — M170 Bilateral primary osteoarthritis of knee: Secondary | ICD-10-CM | POA: Diagnosis not present

## 2021-08-19 DIAGNOSIS — E782 Mixed hyperlipidemia: Secondary | ICD-10-CM

## 2021-08-19 DIAGNOSIS — L723 Sebaceous cyst: Secondary | ICD-10-CM | POA: Diagnosis not present

## 2021-08-19 NOTE — Progress Notes (Signed)
Subjective:  Patient ID: Luke Davila, male    DOB: 1965-08-23  Age: 55 y.o. MRN: [id number]  CC: Leg Pain and Referral   HPI Luke Davila presents for recheck of sarcoid. Taking prednisone. Helps breathing. Not sleeping much.   Last Testosterone shot was just now.  He is concerned that it is no longer helpful for him.  His energy is not good and his sense of wellbeing is not strong.  Patient is having a lot of problems with the strength in his legs.  He has been going to physical therapy and has made some progress but still his gait is very slow and he is taking very short choppy steps.  He would like to continue with physical therapy.  There is a lesion on his back he like to have looked at.  He just noticed it about 4 days ago.  When he leans back against it it is sore.  He has had a prostate procedure that is helped with his urine flow.  As a result he no longer needs the tamsulosin.     08/19/2021    3:50 PM 08/19/2021    3:36 PM 05/28/2021   11:11 AM  Depression screen PHQ 2/9  Decreased Interest 2 0 1  Down, Depressed, Hopeless 1 0 0  PHQ - 2 Score 3 0 1  Altered sleeping 3  2  Tired, decreased energy 0  2  Change in appetite 0  0  Feeling bad or failure about yourself  0  0  Trouble concentrating 0  0  Moving slowly or fidgety/restless 3  2  Suicidal thoughts 0    PHQ-9 Score 9  7  Difficult doing work/chores Somewhat difficult  Not difficult at all    History Luke Davila has a past medical history of Arthritis, Avascular necrosis of hip ([place]), Enlarged prostate, Glaucoma, Gout, History of gout, and Pneumonia.   He has a past surgical history that includes Foot surgery (Left, 01/07/1983); Left hand surgery; Total hip arthroplasty (Left, 04/09/2015); Colonoscopy with propofol (N/A, 11/27/2015); Wisdom tooth extraction; Transurethral resection of prostate (N/A, 03/27/2021); Endobronchial ultrasound (Bilateral, 07/17/2021); Fine needle aspiration (07/17/2021); and Video  bronchoscopy (07/17/2021).   His family history includes Hypertension in his mother.He reports that he has never smoked. He has never used smokeless tobacco. He reports current alcohol use. He reports that he does not use drugs.    ROS Review of Systems  Constitutional:  Positive for activity change. Negative for fever.  Respiratory:  Positive for shortness of breath.   Cardiovascular:  Negative for chest pain.  Musculoskeletal:  Positive for arthralgias and gait problem (slow, short steps).  Skin:  Negative for rash.  Psychiatric/Behavioral:  Positive for sleep disturbance.     Objective:  BP (!) 158/89   Pulse 70   Temp 97.8 F (36.6 C)   Ht '6\' 3"'  (1.905 m)   Wt 237 lb (107.5 kg)   SpO2 97%   BMI 29.62 kg/m   BP Readings from Last 3 Encounters:  08/19/21 (!) 158/89  08/07/21 (!) 146/76  07/17/21 132/79    Wt Readings from Last 3 Encounters:  08/19/21 237 lb (107.5 kg)  08/07/21 231 lb (104.8 kg)  07/17/21 225 lb 1.4 oz (102.1 kg)     Physical Exam Vitals reviewed.  Constitutional:      Appearance: He is well-developed.  HENT:     Head: Normocephalic and atraumatic.     Right Ear: External ear normal.  Left Ear: External ear normal.     Mouth/Throat:     Pharynx: No oropharyngeal exudate or posterior oropharyngeal erythema.  Eyes:     Pupils: Pupils are equal, round, and reactive to light.  Cardiovascular:     Rate and Rhythm: Normal rate and regular rhythm.     Heart sounds: No murmur heard. Pulmonary:     Effort: No respiratory distress.     Breath sounds: Normal breath sounds.  Musculoskeletal:     Cervical back: Normal range of motion and neck supple.  Neurological:     Mental Status: He is alert and oriented to person, place, and time.     Gait: Gait abnormal (short steps, antalgic gait).       Assessment & Plan:   Luke Davila was seen today for leg pain and referral.  Diagnoses and all orders for this visit:  Sarcoidosis of lung ([place]) -      CBC with Differential/Platelet -     CMP14+EGFR  Benign prostatic hyperplasia without lower urinary tract symptoms -     CBC with Differential/Platelet -     CMP14+EGFR  Sebaceous cyst -     CBC with Differential/Platelet -     CMP14+EGFR -     Ambulatory referral to Dermatology  Bilateral primary osteoarthritis of knee -     Ambulatory referral to Physical Therapy  Hypogonadism in male -     Testosterone,Free and Total -     CBC with Differential/Platelet -     CMP14+EGFR  Mixed hyperlipidemia -     CMP14+EGFR -     Lipid panel       I have discontinued Domenick Gong. [name]'s tamsulosin. I am also having him maintain his albuterol, Artificial Tears, Breztri Aerosphere, testosterone cypionate, multivitamin with minerals, methotrexate, sulfamethoxazole-trimethoprim, folic acid, and predniSONE. We administered testosterone cypionate. We will continue to administer testosterone cypionate and testosterone cypionate.  Allergies as of 08/19/2021   No Known Allergies      Medication List        Accurate as of August 19, 2021  4:44 PM. If you have any questions, ask your nurse or doctor.          STOP taking these medications    tamsulosin 0.4 MG Caps capsule Commonly known as: FLOMAX Stopped by: Claretta Fraise, MD       TAKE these medications    albuterol 108 (90 Base) MCG/ACT inhaler Commonly known as: VENTOLIN HFA Inhale 2 puffs into the lungs every 6 (six) hours as needed for wheezing or shortness of breath.   Artificial Tears 1 % ophthalmic solution Generic drug: carboxymethylcellulose Apply 1 drop to eye 2 (two) times daily as needed (dry eyes).   Breztri Aerosphere 160-9-4.8 MCG/ACT Aero Generic drug: Budeson-Glycopyrrol-Formoterol Inhale 2 puffs into the lungs 2 (two) times daily.   folic acid 1 MG tablet Commonly known as: FOLVITE Take 1 tablet (1 mg total) by mouth daily.   methotrexate 5 MG tablet Commonly known as: RHEUMATREX Take 1  tablet (5 mg total) by mouth once a week. Caution: Chemotherapy. Protect from light.   multivitamin with minerals tablet Take 1 tablet by mouth daily.   predniSONE 10 MG tablet Commonly known as: DELTASONE TAKE 4 TABLETS BY MOUTH DAILY WITH BREAKFAST.   sulfamethoxazole-trimethoprim 800-160 MG tablet Commonly known as: BACTRIM DS Take 1 tablet by mouth 3 (three) times a week.   testosterone cypionate 200 MG/ML injection Commonly known as: DEPOTESTOSTERONE CYPIONATE INJECT 1 ML (200  MG TOTAL) INTO THE MUSCLE EVERY 14 DAYS         Follow-up: Return in about 6 months (around 02/19/2022) for [facility] physical.  Claretta Fraise, M.D.

## 2021-08-20 ENCOUNTER — Other Ambulatory Visit: Payer: 59

## 2021-08-20 ENCOUNTER — Ambulatory Visit: Payer: 59

## 2021-08-20 DIAGNOSIS — N179 Acute kidney failure, unspecified: Secondary | ICD-10-CM

## 2021-08-21 ENCOUNTER — Other Ambulatory Visit: Payer: Self-pay | Admitting: Family Medicine

## 2021-08-21 MED ORDER — ROSUVASTATIN CALCIUM 10 MG PO TABS: 10.0000 mg | ORAL_TABLET | Freq: Every day | ORAL | 1 refills | Status: DC

## 2021-08-21 NOTE — Telephone Encounter (Signed)
Pt had OV after calling office and results were discussed during that visit.

## 2021-08-23 ENCOUNTER — Encounter: Payer: Self-pay | Admitting: Family Medicine

## 2021-08-26 LAB — CBC WITH DIFFERENTIAL/PLATELET
Basophils Absolute: 0 10*3/uL (ref 0.0–0.2)
Basos: 0 %
EOS (ABSOLUTE): 0 10*3/uL (ref 0.0–0.4)
Eos: 1 %
Hematocrit: 35.7 % — ABNORMAL LOW (ref 37.5–51.0)
Hemoglobin: 11.7 g/dL — ABNORMAL LOW (ref 13.0–17.7)
Immature Grans (Abs): 0 10*3/uL (ref 0.0–0.1)
Immature Granulocytes: 0 %
Lymphocytes Absolute: 2.1 10*3/uL (ref 0.7–3.1)
Lymphs: 25 %
MCH: 25.6 pg — ABNORMAL LOW (ref 26.6–33.0)
MCHC: 32.8 g/dL (ref 31.5–35.7)
MCV: 78 fL — ABNORMAL LOW (ref 79–97)
Monocytes Absolute: 0.4 10*3/uL (ref 0.1–0.9)
Monocytes: 5 %
Neutrophils Absolute: 6 10*3/uL (ref 1.4–7.0)
Neutrophils: 69 %
Platelets: 211 10*3/uL (ref 150–450)
RBC: 4.57 x10E6/uL (ref 4.14–5.80)
RDW: 15.4 % (ref 11.6–15.4)
WBC: 8.6 10*3/uL (ref 3.4–10.8)

## 2021-08-26 LAB — CMP14+EGFR
ALT: 26 IU/L (ref 0–44)
AST: 19 IU/L (ref 0–40)
Albumin/Globulin Ratio: 1.6 (ref 1.2–2.2)
Albumin: 3.6 g/dL — ABNORMAL LOW (ref 3.8–4.9)
Alkaline Phosphatase: 92 IU/L (ref 44–121)
BUN/Creatinine Ratio: 15 (ref 9–20)
BUN: 26 mg/dL — ABNORMAL HIGH (ref 6–24)
Bilirubin Total: 0.6 mg/dL (ref 0.0–1.2)
CO2: 25 mmol/L (ref 20–29)
Calcium: 8.9 mg/dL (ref 8.7–10.2)
Chloride: 104 mmol/L (ref 96–106)
Creatinine, Ser: 1.71 mg/dL — ABNORMAL HIGH (ref 0.76–1.27)
Globulin, Total: 2.3 g/dL (ref 1.5–4.5)
Glucose: 82 mg/dL (ref 70–99)
Potassium: 4.2 mmol/L (ref 3.5–5.2)
Sodium: 142 mmol/L (ref 134–144)
Total Protein: 5.9 g/dL — ABNORMAL LOW (ref 6.0–8.5)
eGFR: 47 mL/min/{1.73_m2} — ABNORMAL LOW (ref >59)

## 2021-08-26 LAB — LIPID PANEL
Chol/HDL Ratio: 2.3 ratio (ref 0.0–5.0)
Cholesterol, Total: 233 mg/dL — ABNORMAL HIGH (ref 100–199)
HDL: 101 mg/dL (ref >39)
LDL Chol Calc (NIH): 125 mg/dL — ABNORMAL HIGH (ref 0–99)
Triglycerides: 43 mg/dL (ref 0–149)
VLDL Cholesterol Cal: 7 mg/dL (ref 5–40)

## 2021-08-26 LAB — TESTOSTERONE,FREE AND TOTAL
Testosterone, Free: 14.7 pg/mL (ref 7.2–24.0)
Testosterone: 703 ng/dL (ref 264–916)

## 2021-08-27 ENCOUNTER — Encounter: Payer: Self-pay | Admitting: Family Medicine

## 2021-08-27 DIAGNOSIS — D869 Sarcoidosis, unspecified: Secondary | ICD-10-CM

## 2021-08-27 DIAGNOSIS — N1831 Chronic kidney disease, stage 3a: Secondary | ICD-10-CM

## 2021-09-02 ENCOUNTER — Other Ambulatory Visit: Payer: Self-pay | Admitting: *Deleted

## 2021-09-02 ENCOUNTER — Encounter: Payer: Self-pay | Admitting: Internal Medicine

## 2021-09-02 ENCOUNTER — Ambulatory Visit: Payer: 59 | Attending: Internal Medicine | Admitting: Internal Medicine

## 2021-09-02 ENCOUNTER — Other Ambulatory Visit: Payer: 59

## 2021-09-02 VITALS — BP 148/72 | HR 60 | Ht 73.0 in | Wt 234.4 lb

## 2021-09-02 DIAGNOSIS — D860 Sarcoidosis of lung: Secondary | ICD-10-CM

## 2021-09-02 DIAGNOSIS — E871 Hypo-osmolality and hyponatremia: Secondary | ICD-10-CM

## 2021-09-02 NOTE — Patient Instructions (Addendum)
Medication Instructions:  The current medical regimen is effective;  continue present plan and medications.  *If you need a refill on your cardiac medications before your next appointment, please call your pharmacy*   Follow-Up: At [facility], you and your health needs are our priority.  As part of our continuing mission to provide you with exceptional heart care, we have created designated Provider Care Teams.  These Care Teams include your primary Cardiologist (physician) and Advanced Practice Providers (APPs -  Physician Assistants and Nurse Practitioners) who all work together to provide you with the care you need, when you need it.  We recommend signing up for the patient portal called "MyChart".  Sign up information is provided on this After Visit Summary.  MyChart is used to connect with patients for Virtual Visits (Telemedicine).  Patients are able to view lab/test results, encounter notes, upcoming appointments, etc.  Non-urgent messages can be sent to your provider as well.   To learn more about what you can do with MyChart, go to [email or web address].    Your next appointment:   12 month(s)  The format for your next appointment:   In Person  Provider:   Carolan Clines, MD      We discussed that if you have persistent light headedness, fainting spells, or rapid heart rates; to let us know and will plan to monitor your heart and look for signs of cardiac sarcoid with an MRI. Otherwise will see you back in a year
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
## 2021-09-03 LAB — CMP14+EGFR
ALT: 23 IU/L (ref 0–44)
AST: 25 IU/L (ref 0–40)
Albumin/Globulin Ratio: 1.8 (ref 1.2–2.2)
Albumin: 4.2 g/dL (ref 3.8–4.9)
Alkaline Phosphatase: 90 IU/L (ref 44–121)
BUN/Creatinine Ratio: 12 (ref 9–20)
BUN: 19 mg/dL (ref 6–24)
Bilirubin Total: 0.5 mg/dL (ref 0.0–1.2)
CO2: 23 mmol/L (ref 20–29)
Calcium: 9 mg/dL (ref 8.7–10.2)
Chloride: 104 mmol/L (ref 96–106)
Creatinine, Ser: 1.6 mg/dL — ABNORMAL HIGH (ref 0.76–1.27)
Globulin, Total: 2.3 g/dL (ref 1.5–4.5)
Glucose: 99 mg/dL (ref 70–99)
Potassium: 5 mmol/L (ref 3.5–5.2)
Sodium: 142 mmol/L (ref 134–144)
Total Protein: 6.5 g/dL (ref 6.0–8.5)
eGFR: 50 mL/min/{1.73_m2} — ABNORMAL LOW (ref >59)

## 2021-09-03 NOTE — Progress Notes (Signed)
Hello Luke Davila,  Your lab result is normal and/or stable.Some minor variations that are not significant are commonly marked abnormal, but do not represent any medical problem for you.  Best regards, Xavien Dauphinais, M.D.

## 2021-09-04 ENCOUNTER — Ambulatory Visit (INDEPENDENT_AMBULATORY_CARE_PROVIDER_SITE_OTHER): Payer: 59 | Admitting: *Deleted

## 2021-09-04 DIAGNOSIS — E291 Testicular hypofunction: Secondary | ICD-10-CM | POA: Diagnosis not present

## 2021-09-09 LAB — ACID FAST CULTURE WITH REFLEXED SENSITIVITIES (MYCOBACTERIA): Acid Fast Culture: NEGATIVE

## 2021-09-17 ENCOUNTER — Ambulatory Visit (INDEPENDENT_AMBULATORY_CARE_PROVIDER_SITE_OTHER): Payer: 59

## 2021-09-17 ENCOUNTER — Other Ambulatory Visit: Payer: Self-pay | Admitting: Family Medicine

## 2021-09-17 ENCOUNTER — Other Ambulatory Visit: Payer: 59

## 2021-09-17 DIAGNOSIS — E291 Testicular hypofunction: Secondary | ICD-10-CM

## 2021-09-17 DIAGNOSIS — N401 Enlarged prostate with lower urinary tract symptoms: Secondary | ICD-10-CM

## 2021-09-17 DIAGNOSIS — N1831 Chronic kidney disease, stage 3a: Secondary | ICD-10-CM

## 2021-09-17 DIAGNOSIS — D869 Sarcoidosis, unspecified: Secondary | ICD-10-CM

## 2021-09-17 NOTE — Progress Notes (Signed)
Patient come in for Testerone injection.  Injection of 1mL (200mg) into lt upper gult. - Patient tolerated well.  Medication was patient supplied  

## 2021-09-17 NOTE — Progress Notes (Unsigned)
Patient come in for Testerone injection.  Injection of 58mL (200mg ) into lt upper gult. - Patient tolerated well.  Medication was patient supplied

## 2021-09-18 LAB — BMP8+EGFR
BUN/Creatinine Ratio: 11 (ref 9–20)
BUN: 20 mg/dL (ref 6–24)
CO2: 26 mmol/L (ref 20–29)
Calcium: 9.1 mg/dL (ref 8.7–10.2)
Chloride: 107 mmol/L — ABNORMAL HIGH (ref 96–106)
Creatinine, Ser: 1.82 mg/dL — ABNORMAL HIGH (ref 0.76–1.27)
Glucose: 85 mg/dL (ref 70–99)
Potassium: 4.3 mmol/L (ref 3.5–5.2)
Sodium: 145 mmol/L — ABNORMAL HIGH (ref 134–144)
eGFR: 43 mL/min/{1.73_m2} — ABNORMAL LOW (ref >59)

## 2021-09-22 ENCOUNTER — Other Ambulatory Visit: Payer: Self-pay | Admitting: Pulmonary Disease

## 2021-09-23 ENCOUNTER — Encounter: Payer: Self-pay | Admitting: Pulmonary Disease

## 2021-09-23 ENCOUNTER — Ambulatory Visit (INDEPENDENT_AMBULATORY_CARE_PROVIDER_SITE_OTHER): Payer: 59 | Admitting: Pulmonary Disease

## 2021-09-23 VITALS — BP 140/78 | HR 64 | Temp 98.1°F | Ht 75.0 in | Wt 254.4 lb

## 2021-09-23 DIAGNOSIS — D860 Sarcoidosis of lung: Secondary | ICD-10-CM

## 2021-09-23 DIAGNOSIS — Z5181 Encounter for therapeutic drug level monitoring: Secondary | ICD-10-CM

## 2021-09-23 MED ORDER — METHOTREXATE SODIUM 10 MG PO TABS: 10.0000 mg | ORAL_TABLET | ORAL | 3 refills | Status: DC

## 2021-09-23 NOTE — Progress Notes (Unsigned)
Luke Davila    [id number]    04/15/1965  Primary Care Physician:Stacks, Cletus Gash, MD  Referring Physician: Claretta Fraise, MD [place],  [name] [id number]  Chief complaint: Follow-up for interstitial lung disease  HPI: 56 y.o. with history of interstitial lung disease.  Originally seen as a consult in June 2023 with abnormal CT showing upper lung predominant changes.  This was initially noted in 2021 and was told he had sarcoidosis based on the pattern of interstitial lung disease.  He never had a biopsy done or any specific treatment given.  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 Has increasing dyspnea on exertion with flareups 2-3 times a year treated with prednisone tapers.  He was started on breztri in April 2023  He is here with his ex-wife who helps him with his healthcare needs and appointments  Underwent bronchoscopy with endobronchial ultrasound of lymph nodes on 07/17/21 with findings of granulomatous inflammation consistent with sarcoid.  Cultures are negative.  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  No feather pillows or comforter Smoking history: Never smoker Travel history: No significant travel his Relevant family history: No family history of lung disease  Interim history: Started on methotrexate 5 mg/week in July.  Continues on prednisone at 40 mg/day He is seen cardiology for evaluation of abnormal EKG which was not felt to be secondary to cardiac sarcoid  States that breathing is doing well.  Has had 20 pounds weight gain while on prednisone.  Outpatient Encounter Medications as of 09/23/2021  Medication Sig   albuterol (VENTOLIN HFA) 108 (90 Base) MCG/ACT inhaler Inhale 2 puffs into the lungs every 6 (six) hours as needed for wheezing or  shortness of breath.   ARTIFICIAL TEARS 1 % ophthalmic solution Apply 1 drop to eye 2 (two) times daily as needed (dry eyes).   Budeson-Glycopyrrol-Formoterol (BREZTRI AEROSPHERE) 160-9-4.8 MCG/ACT AERO Inhale 2 puffs into the lungs 2 (two) times daily.   folic acid (FOLVITE) 1 MG tablet Take 1 tablet (1 mg total) by mouth daily.   methotrexate (RHEUMATREX) 5 MG tablet Take 1 tablet (5 mg total) by mouth once a week. Caution: Chemotherapy. Protect from light.   predniSONE (DELTASONE) 10 MG tablet TAKE 4 TABLETS BY MOUTH DAILY WITH BREAKFAST.   rosuvastatin (CRESTOR) 10 MG tablet Take 1 tablet (10 mg total) by mouth daily. For cholesterol   sulfamethoxazole-trimethoprim (BACTRIM DS) 800-160 MG tablet Take 1 tablet by mouth 3 (three) times a week.   testosterone cypionate (DEPOTESTOSTERONE CYPIONATE) 200 MG/ML injection INJECT 1 ML (200 MG TOTAL) INTO THE MUSCLE EVERY 14 DAYS   Facility-Administered Encounter Medications as of 09/23/2021  Medication   testosterone cypionate (DEPOTESTOSTERONE CYPIONATE) injection 200 mg   testosterone cypionate (DEPOTESTOSTERONE CYPIONATE) injection 200 mg    Physical Exam: Blood pressure (!) 140/78, pulse 64, temperature 98.1 F (36.7 C), temperature source Oral, height 6\' 3"  (1.905 m), weight 254 lb 6.4 oz (115.4 kg), SpO2 98 %. Gen:      No acute distress HEENT:  EOMI, sclera anicteric Neck:     No masses; no thyromegaly Lungs:    Clear to auscultation bilaterally; normal respiratory effort CV:         Regular  rate and rhythm; no murmurs Abd:      + bowel sounds; soft, non-tender; no palpable masses, no distension Ext:    No edema; adequate peripheral perfusion Skin:      Warm and dry; no rash Neuro: alert and oriented x 3 Psych: normal mood and affect   Data Reviewed: Imaging: CT high-resolution 06/13/2020-unchanged pattern of pulmonary fibrosis with upper lung predominance with peribronchovascular opacity, bronchiolectasis, enlargement Instylan hilar  lymph nodes  High-resolution CT 06/28/2021-pulmonary fibrosis in similar pattern with slight worsening of fibrotic changes.  Hilar, mediastinal lymphadenopathy I reviewed the images personally  PFTs: 06/17/2021 FVC 3.14 [69%], FEV1 2.40 [64%], F/F 76, TLC 4.36 [56%], DLCO 15.23 [47%] Moderate obstruction with bronchodilator response, severe restriction and diffusion impairment  Labs: CTD serologies 06/10/2021-negative Angiotensin-converting enzyme 06/10/2021- 47  Bronchoscopy with endobronchial ultrasound biopsy 07/17/2021 Station 7 lymph node biopsy with granulomatous inflammation.  Lymph node cultures negative to date  Assessment:  Sarcoidosis CT findings are suggestive of sarcoidosis and lymph node biopsy by endobronchial ultrasound confirms granulomatous inflammation. He is currently on prednisone and will also start methotrexate as he will require long-term therapy given severity of disease, fibrotic changes and also presence of ocular sarcoid.  Start folic acid supplementation and Bactrim prophylaxis  Increase methotrexate to 10 mg/week.  Continue folic acid Check CBC for monitoring Start tapering prednisone by 10 mg every 2 weeks   Obstructive airway disease Unlikely to be COPD as he is a non-smoker.  He could have asthma versus airway involvement of sarcoid He is doing well on breztri and will continue the same  Plan/Recommendations: Continue breztri inhaler Start prednisone taper Increase methotrexate to 10 mg/week  Chilton Greathouse MD [facility] 09/23/2021, 4:21 PM  CC: Mechele Claude, MD

## 2021-09-23 NOTE — Patient Instructions (Addendum)
We will check CBC today Increase methotrexate to 10 mg weekly.  Continue folic acid Start prednisone taper by reducing to 30 mg/day for 2 weeks and then reduce dose by 10 mg/day every 2 weeks He can stop the Bactrim once the prednisone dose is at 10 mg/day Follow-up in 1 to 2 months.

## 2021-09-24 LAB — CBC WITH DIFFERENTIAL/PLATELET
Basophils Absolute: 0 10*3/uL (ref 0.0–0.1)
Basophils Relative: 0.2 % (ref 0.0–3.0)
Eosinophils Absolute: 0 10*3/uL (ref 0.0–0.7)
Eosinophils Relative: 0.1 % (ref 0.0–5.0)
HCT: 40.5 % (ref 39.0–52.0)
Hemoglobin: 13.2 g/dL (ref 13.0–17.0)
Lymphocytes Relative: 6.4 % — ABNORMAL LOW (ref 12.0–46.0)
Lymphs Abs: 0.6 10*3/uL — ABNORMAL LOW (ref 0.7–4.0)
MCHC: 32.5 g/dL (ref 30.0–36.0)
MCV: 82 fl (ref 78.0–100.0)
Monocytes Absolute: 0.2 10*3/uL (ref 0.1–1.0)
Monocytes Relative: 1.9 % — ABNORMAL LOW (ref 3.0–12.0)
Neutro Abs: 8 10*3/uL — ABNORMAL HIGH (ref 1.4–7.7)
Neutrophils Relative %: 91.4 % — ABNORMAL HIGH (ref 43.0–77.0)
Platelets: 166 10*3/uL (ref 150.0–400.0)
RBC: 4.94 Mil/uL (ref 4.22–5.81)
RDW: 17.9 % — ABNORMAL HIGH (ref 11.5–15.5)
WBC: 8.7 10*3/uL (ref 4.0–10.5)

## 2021-09-27 ENCOUNTER — Emergency Department (HOSPITAL_COMMUNITY)
Admission: EM | Admit: 2021-09-27 | Discharge: 2021-09-27 | Disposition: A | Discharge status: Home / Self Care | Payer: 59 | Attending: Emergency Medicine | Admitting: Emergency Medicine

## 2021-09-27 ENCOUNTER — Encounter (HOSPITAL_COMMUNITY): Payer: Self-pay

## 2021-09-27 ENCOUNTER — Emergency Department (HOSPITAL_COMMUNITY): Payer: 59

## 2021-09-27 ENCOUNTER — Other Ambulatory Visit: Payer: Self-pay

## 2021-09-27 DIAGNOSIS — R600 Localized edema: Secondary | ICD-10-CM | POA: Diagnosis not present

## 2021-09-27 DIAGNOSIS — I10 Essential (primary) hypertension: Secondary | ICD-10-CM | POA: Diagnosis not present

## 2021-09-27 DIAGNOSIS — M7989 Other specified soft tissue disorders: Secondary | ICD-10-CM | POA: Diagnosis present

## 2021-09-27 DIAGNOSIS — Z79899 Other long term (current) drug therapy: Secondary | ICD-10-CM | POA: Diagnosis not present

## 2021-09-27 LAB — COMPREHENSIVE METABOLIC PANEL
ALT: 40 U/L (ref 0–44)
AST: 29 U/L (ref 15–41)
Albumin: 3.7 g/dL (ref 3.5–5.0)
Alkaline Phosphatase: 72 U/L (ref 38–126)
Anion gap: 7 (ref 5–15)
BUN: 29 mg/dL — ABNORMAL HIGH (ref 6–20)
CO2: 29 mmol/L (ref 22–32)
Calcium: 8.9 mg/dL (ref 8.9–10.3)
Chloride: 110 mmol/L (ref 98–111)
Creatinine, Ser: 1.65 mg/dL — ABNORMAL HIGH (ref 0.61–1.24)
GFR, Estimated: 48 mL/min — ABNORMAL LOW (ref >60)
Glucose, Bld: 87 mg/dL (ref 70–99)
Potassium: 4.5 mmol/L (ref 3.5–5.1)
Sodium: 146 mmol/L — ABNORMAL HIGH (ref 135–145)
Total Bilirubin: 1 mg/dL (ref 0.3–1.2)
Total Protein: 6.8 g/dL (ref 6.5–8.1)

## 2021-09-27 LAB — URINALYSIS, ROUTINE W REFLEX MICROSCOPIC
Bacteria, UA: NONE SEEN
Bilirubin Urine: NEGATIVE
Glucose, UA: NEGATIVE mg/dL
Ketones, ur: NEGATIVE mg/dL
Leukocytes,Ua: NEGATIVE
Nitrite: NEGATIVE
Protein, ur: NEGATIVE mg/dL
Specific Gravity, Urine: 1.01 (ref 1.005–1.030)
pH: 6 (ref 5.0–8.0)

## 2021-09-27 LAB — CBC
HCT: 41.7 % (ref 39.0–52.0)
Hemoglobin: 12.9 g/dL — ABNORMAL LOW (ref 13.0–17.0)
MCH: 26.4 pg (ref 26.0–34.0)
MCHC: 30.9 g/dL (ref 30.0–36.0)
MCV: 85.5 fL (ref 80.0–100.0)
Platelets: 168 10*3/uL (ref 150–400)
RBC: 4.88 MIL/uL (ref 4.22–5.81)
RDW: 16.5 % — ABNORMAL HIGH (ref 11.5–15.5)
WBC: 6.6 10*3/uL (ref 4.0–10.5)
nRBC: 0 % (ref 0.0–0.2)

## 2021-09-27 LAB — BRAIN NATRIURETIC PEPTIDE: B Natriuretic Peptide: 288 pg/mL — ABNORMAL HIGH (ref 0.0–100.0)

## 2021-09-27 MED ORDER — AMLODIPINE BESYLATE 5 MG PO TABS: 5.0000 mg | ORAL_TABLET | Freq: Every day | ORAL | 0 refills | Status: DC

## 2021-09-27 MED ORDER — AMLODIPINE BESYLATE 5 MG PO TABS
5.0000 mg | ORAL_TABLET | Freq: Once | ORAL | Status: AC
  Administered 2021-09-27: 5 mg via ORAL
  Filled 2021-09-27: qty 1

## 2021-09-27 NOTE — ED Triage Notes (Signed)
Pt c/o bilateral ankle swelling since this morning. Pt states he sees a urologist and a nephrologist for his kidneys.

## 2021-09-27 NOTE — ED Provider Triage Note (Signed)
Emergency Medicine Provider Triage Evaluation Note  Luke Davila , a 56 y.o. male  was evaluated in triage.  Pt complains of legs swelling. Report noticing bilateral ankle swelling this AM.  Some chest tightness.  No fever, cough, chest pain, or trauma.  Hx of renal disease.  Currently on prednisone for sarcoidosis. No hx of PE/DVT  Review of Systems  Positive: As above Negative: As above  Physical Exam  BP (!) 164/100   Pulse (!) 59   Temp 97.7 F (36.5 C) (Oral)   Resp 16   SpO2 96%  Gen:   Awake, no distress   Resp:  Normal effort  MSK:   Moves extremities without difficulty  Other:    Medical Decision Making  Medically screening exam initiated at 2:29 PM.  Appropriate orders placed.  Luke Davila was informed that the remainder of the evaluation will be completed by another provider, this initial triage assessment does not replace that evaluation, and the importance of remaining in the ED until their evaluation is complete.     Domenic Moras, PA-C 09/27/21 1430

## 2021-09-27 NOTE — ED Provider Notes (Signed)
[facility] DEPT Provider Note   CSN: [id number] Arrival date & time: 09/27/21  1337     History {Add pertinent medical, surgical, social history, OB history to HPI:1} Chief Complaint  Patient presents with  . Bilateral Ankle Swelling    Luke Davila is a 56 y.o. male.  HPI     56yo male with history of sarcoidosis, ILD, currently being weaned off of steroids and placed on methotrexate who presents with concern for bilateral leg swelling.   ILD Sarcoidosis Weaning off of steroids now Starting methotrexate No hx of htn Woke up this AM with lower extremity swelling, not sure why.  Has not had dyspnea, no fever, no chest pain (mild discomfort when sitting up or pushing) has not had symptoms with ambulating.     Past Medical History:  Diagnosis Date  . Arthritis   . Avascular necrosis of hip (HCC)    LEFT  . Enlarged prostate   . Glaucoma   . Gout   . History of gout   . Pneumonia     Home Medications Prior to Admission medications   Medication Sig Start Date End Date Taking? Authorizing Provider  albuterol (VENTOLIN HFA) 108 (90 Base) MCG/ACT inhaler Inhale 2 puffs into the lungs every 6 (six) hours as needed for wheezing or shortness of breath. 11/27/20   Gwenlyn Perking, FNP  ARTIFICIAL TEARS 1 % ophthalmic solution Apply 1 drop to eye 2 (two) times daily as needed (dry eyes). 11/06/20   [provider]  Budeson-Glycopyrrol-Formoterol (BREZTRI AEROSPHERE) 160-9-4.8 MCG/ACT AERO Inhale 2 puffs into the lungs 2 (two) times daily. 05/01/21   Gwenlyn Perking, FNP  folic acid (FOLVITE) 1 MG tablet Take 1 tablet (1 mg total) by mouth daily. 08/07/21   Mannam, Hart Robinsons, MD  methotrexate (RHEUMATREX) 10 MG tablet Take 1 tablet (10 mg total) by mouth once a week. Caution: Chemotherapy. Protect from light. 09/23/21   Mannam, Hart Robinsons, MD  predniSONE (DELTASONE) 10 MG tablet TAKE 4 TABLETS BY MOUTH DAILY WITH BREAKFAST. 09/23/21   Mannam,  Hart Robinsons, MD  rosuvastatin (CRESTOR) 10 MG tablet Take 1 tablet (10 mg total) by mouth daily. For cholesterol 08/21/21   Claretta Fraise, MD  sulfamethoxazole-trimethoprim (BACTRIM DS) 800-160 MG tablet Take 1 tablet by mouth 3 (three) times a week. 08/07/21   Mannam, Hart Robinsons, MD  testosterone cypionate (DEPOTESTOSTERONE CYPIONATE) 200 MG/ML injection INJECT 1 ML (200 MG TOTAL) INTO THE MUSCLE EVERY 14 DAYS 05/14/21   Claretta Fraise, MD      Allergies    Patient has no known allergies.    Review of Systems   Review of Systems  Physical Exam Updated Vital Signs BP (!) 195/114 (BP Location: Left Arm)   Pulse (!) 55   Temp 97.8 F (36.6 C) (Oral)   Resp 20   SpO2 99%  Physical Exam  ED Results / Procedures / Treatments   Labs (all labs ordered are listed, but only abnormal results are displayed) Labs Reviewed  BRAIN NATRIURETIC PEPTIDE - Abnormal; Notable for the following components:      Result Value   B Natriuretic Peptide 288.0 (*)    All other components within normal limits  COMPREHENSIVE METABOLIC PANEL - Abnormal; Notable for the following components:   Sodium 146 (*)    BUN 29 (*)    Creatinine, Ser 1.65 (*)    GFR, Estimated 48 (*)    All other components within normal limits  URINALYSIS, ROUTINE W REFLEX MICROSCOPIC -  Abnormal; Notable for the following components:   Color, Urine STRAW (*)    Hgb urine dipstick MODERATE (*)    All other components within normal limits  CBC - Abnormal; Notable for the following components:   Hemoglobin 12.9 (*)    RDW 16.5 (*)    All other components within normal limits    EKG None  Radiology DG Chest 2 View  Result Date: 09/27/2021 CLINICAL DATA:  56 year old male with chest pain and leg swelling. Sarcoidosis. EXAM: CHEST - 2 VIEW COMPARISON:  CT 06/27/2021, chest x-ray 07/17/2021 FINDINGS: Normal cardiac silhouette. There is chronic biapical pulmonary scarring and bronchiectasis. Lower lungs relatively clear. No airspace  disease. No pneumothorax. No acute osseous abnormality. IMPRESSION: 1. Chronic biapical pulmonary consolidation and scarring. Fibrotic pulmonary sarcoidosis identified on prior CT. 2. No acute cardiopulmonary findings. Electronically Signed   By: Genevive Bi M.D.   On: 09/27/2021 15:18    Procedures Procedures  {Document cardiac monitor, telemetry assessment procedure when appropriate:1}  Medications Ordered in ED Medications - No data to display  ED Course/ Medical Decision Making/ A&P                           Medical Decision Making Amount and/or Complexity of Data Reviewed Labs: ordered.  Risk Prescription drug management.   56yo male with history of sarcoidosis, ILD, currently being weaned off of steroids and placed on methotrexate who presents with concern for bilateral leg swelling.   Low suspicion for DVT given bilateral nature.  No sign of proteinuria, albumin WNL.  Normal pulses, no sign of cellulitis.  Possible CHF with BNP increase. No dyspnea, no hypoxia, no   {Document critical care time when appropriate:1} {Document review of labs and clinical decision tools ie heart score, Chads2Vasc2 etc:1}  {Document your independent review of radiology images, and any outside records:1} {Document your discussion with family members, caretakers, and with consultants:1} {Document social determinants of health affecting pt's care:1} {Document your decision making why or why not admission, treatments were needed:1} Final Clinical Impression(s) / ED Diagnoses Final diagnoses:  None    Rx / DC Orders ED Discharge Orders     None

## 2021-10-01 ENCOUNTER — Ambulatory Visit: Payer: 59

## 2021-10-02 ENCOUNTER — Encounter: Payer: Self-pay | Admitting: Physician Assistant

## 2021-10-02 ENCOUNTER — Ambulatory Visit: Payer: 59 | Attending: Internal Medicine | Admitting: Physician Assistant

## 2021-10-02 VITALS — BP 162/70 | HR 60 | Ht 75.0 in | Wt 241.8 lb

## 2021-10-02 DIAGNOSIS — I493 Ventricular premature depolarization: Secondary | ICD-10-CM

## 2021-10-02 DIAGNOSIS — D860 Sarcoidosis of lung: Secondary | ICD-10-CM | POA: Diagnosis not present

## 2021-10-02 DIAGNOSIS — M7989 Other specified soft tissue disorders: Secondary | ICD-10-CM

## 2021-10-02 DIAGNOSIS — I6521 Occlusion and stenosis of right carotid artery: Secondary | ICD-10-CM

## 2021-10-02 DIAGNOSIS — I10 Essential (primary) hypertension: Secondary | ICD-10-CM

## 2021-10-02 DIAGNOSIS — I700 Atherosclerosis of aorta: Secondary | ICD-10-CM

## 2021-10-02 NOTE — Patient Instructions (Addendum)
Medication Instructions:  Your physician recommends that you continue on your current medications as directed. Please refer to the Current Medication list given to you today.  *If you need a refill on your cardiac medications before your next appointment, please call your pharmacy*  Lab Work: NONE If you have labs (blood work) drawn today and your tests are completely normal, you will receive your results only by: [facility] (if you have MyChart) OR A paper copy in the mail If you have any lab test that is abnormal or we need to change your treatment, we will call you to review the results.   Testing/Procedures: Your physician has requested that you have an echocardiogram. Echocardiography is a painless test that uses sound waves to create images of your heart. It provides your doctor with information about the size and shape of your heart and how well your heart's chambers and valves are working. This procedure takes approximately one hour. There are no restrictions for this procedure.    Follow-Up: At [facility], you and your health needs are our priority.  As part of our continuing mission to provide you with exceptional heart care, we have created designated Provider Care Teams.  These Care Teams include your primary Cardiologist (physician) and Advanced Practice Providers (APPs -  Physician Assistants and Nurse Practitioners) who all work together to provide you with the care you need, when you need it.  We recommend signing up for the patient portal called "MyChart".  Sign up information is provided on this After Visit Summary.  MyChart is used to connect with patients for Virtual Visits (Telemedicine).  Patients are able to view lab/test results, encounter notes, upcoming appointments, etc.  Non-urgent messages can be sent to your provider as well.   To learn more about what you can do with MyChart, go to [email or web address].    Your next appointment:   6  week(s)  The format for your next appointment:   In Person  Provider:   Almyra Deforest, PA-C   or Dr. Harl Bowie.  Other Instructions Continue take Amlodipine 5 mg Daily, Please check BP for the next two weeks if the top number still over 140 please double the amlodipine at 10 mg daily.
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
## 2021-10-04 ENCOUNTER — Encounter: Payer: Self-pay | Admitting: Physician Assistant

## 2021-10-09 ENCOUNTER — Ambulatory Visit: Payer: 59 | Admitting: Internal Medicine

## 2021-10-14 ENCOUNTER — Other Ambulatory Visit: Payer: Self-pay | Admitting: *Deleted

## 2021-10-14 ENCOUNTER — Other Ambulatory Visit: Payer: 59

## 2021-10-14 DIAGNOSIS — N1831 Chronic kidney disease, stage 3a: Secondary | ICD-10-CM

## 2021-10-14 DIAGNOSIS — D869 Sarcoidosis, unspecified: Secondary | ICD-10-CM

## 2021-10-14 MED ORDER — AMLODIPINE BESYLATE 10 MG PO TABS: 10.0000 mg | ORAL_TABLET | Freq: Every day | ORAL | 0 refills | Status: DC

## 2021-10-14 NOTE — Telephone Encounter (Signed)
Pt's emergency contact, no name given, left a message on the [facility]. Location refill line, stating pt's bp is still elevated and he has had to increase the Amlodipine to 10 mg daily and has ran out of Amlodipine 5 mg daily due to doubling dosage.  I have sent in new prescription of Amlodipine 10 mg daily to [facility].  Will route to [facility] [place], to make him aware.

## 2021-10-15 ENCOUNTER — Ambulatory Visit: Payer: 59

## 2021-10-15 LAB — BMP8+EGFR
BUN/Creatinine Ratio: 13 (ref 9–20)
BUN: 26 mg/dL — ABNORMAL HIGH (ref 6–24)
CO2: 24 mmol/L (ref 20–29)
Calcium: 9.1 mg/dL (ref 8.7–10.2)
Chloride: 104 mmol/L (ref 96–106)
Creatinine, Ser: 1.98 mg/dL — ABNORMAL HIGH (ref 0.76–1.27)
Glucose: 103 mg/dL — ABNORMAL HIGH (ref 70–99)
Potassium: 5 mmol/L (ref 3.5–5.2)
Sodium: 149 mmol/L — ABNORMAL HIGH (ref 134–144)
eGFR: 39 mL/min/{1.73_m2} — ABNORMAL LOW (ref >59)

## 2021-10-16 ENCOUNTER — Encounter: Payer: Self-pay | Admitting: Family Medicine

## 2021-10-21 ENCOUNTER — Other Ambulatory Visit: Payer: Self-pay | Admitting: Pulmonary Disease

## 2021-10-21 ENCOUNTER — Ambulatory Visit (INDEPENDENT_AMBULATORY_CARE_PROVIDER_SITE_OTHER): Payer: 59

## 2021-10-21 ENCOUNTER — Other Ambulatory Visit: Payer: 59

## 2021-10-21 DIAGNOSIS — N1831 Chronic kidney disease, stage 3a: Secondary | ICD-10-CM

## 2021-10-21 DIAGNOSIS — M7989 Other specified soft tissue disorders: Secondary | ICD-10-CM | POA: Diagnosis not present

## 2021-10-21 DIAGNOSIS — D869 Sarcoidosis, unspecified: Secondary | ICD-10-CM

## 2021-10-21 DIAGNOSIS — I10 Essential (primary) hypertension: Secondary | ICD-10-CM

## 2021-10-21 LAB — ECHOCARDIOGRAM COMPLETE
Area-P 1/2: 4.21 cm2
S' Lateral: 3.01 cm

## 2021-10-22 ENCOUNTER — Encounter: Payer: Self-pay | Admitting: Family Medicine

## 2021-10-22 LAB — BMP8+EGFR
BUN/Creatinine Ratio: 13 (ref 9–20)
BUN: 28 mg/dL — ABNORMAL HIGH (ref 6–24)
CO2: 27 mmol/L (ref 20–29)
Calcium: 9.4 mg/dL (ref 8.7–10.2)
Chloride: 107 mmol/L — ABNORMAL HIGH (ref 96–106)
Creatinine, Ser: 2.2 mg/dL — ABNORMAL HIGH (ref 0.76–1.27)
Glucose: 86 mg/dL (ref 70–99)
Potassium: 4.1 mmol/L (ref 3.5–5.2)
Sodium: 146 mmol/L — ABNORMAL HIGH (ref 134–144)
eGFR: 34 mL/min/{1.73_m2} — ABNORMAL LOW (ref >59)

## 2021-10-30 ENCOUNTER — Ambulatory Visit (INDEPENDENT_AMBULATORY_CARE_PROVIDER_SITE_OTHER): Payer: 59 | Admitting: *Deleted

## 2021-10-30 DIAGNOSIS — E291 Testicular hypofunction: Secondary | ICD-10-CM

## 2021-11-01 ENCOUNTER — Other Ambulatory Visit: Payer: Self-pay | Admitting: Internal Medicine

## 2021-11-01 DIAGNOSIS — N1831 Chronic kidney disease, stage 3a: Secondary | ICD-10-CM

## 2021-11-01 DIAGNOSIS — N139 Obstructive and reflux uropathy, unspecified: Secondary | ICD-10-CM

## 2021-11-01 DIAGNOSIS — N400 Enlarged prostate without lower urinary tract symptoms: Secondary | ICD-10-CM

## 2021-11-01 DIAGNOSIS — N179 Acute kidney failure, unspecified: Secondary | ICD-10-CM

## 2021-11-01 DIAGNOSIS — I10 Essential (primary) hypertension: Secondary | ICD-10-CM

## 2021-11-04 ENCOUNTER — Ambulatory Visit
Admission: RE | Admit: 2021-11-04 | Discharge: 2021-11-04 | Disposition: A | Discharge status: Home / Self Care | Payer: 59 | Source: Ambulatory Visit | Attending: Internal Medicine | Admitting: Internal Medicine

## 2021-11-04 DIAGNOSIS — N139 Obstructive and reflux uropathy, unspecified: Secondary | ICD-10-CM

## 2021-11-04 DIAGNOSIS — N400 Enlarged prostate without lower urinary tract symptoms: Secondary | ICD-10-CM

## 2021-11-04 DIAGNOSIS — N179 Acute kidney failure, unspecified: Secondary | ICD-10-CM

## 2021-11-04 DIAGNOSIS — N1831 Chronic kidney disease, stage 3a: Secondary | ICD-10-CM

## 2021-11-04 DIAGNOSIS — I10 Essential (primary) hypertension: Secondary | ICD-10-CM

## 2021-11-11 ENCOUNTER — Other Ambulatory Visit: Payer: 59

## 2021-11-11 ENCOUNTER — Encounter: Payer: Self-pay | Admitting: Internal Medicine

## 2021-11-11 ENCOUNTER — Other Ambulatory Visit: Payer: Self-pay | Admitting: Family Medicine

## 2021-11-11 ENCOUNTER — Ambulatory Visit: Payer: 59 | Attending: Internal Medicine | Admitting: Internal Medicine

## 2021-11-11 VITALS — BP 142/82 | HR 94 | Ht 75.0 in | Wt 234.0 lb

## 2021-11-11 DIAGNOSIS — N1831 Chronic kidney disease, stage 3a: Secondary | ICD-10-CM

## 2021-11-11 DIAGNOSIS — D869 Sarcoidosis, unspecified: Secondary | ICD-10-CM

## 2021-11-11 DIAGNOSIS — M7989 Other specified soft tissue disorders: Secondary | ICD-10-CM

## 2021-11-11 MED ORDER — CHLORTHALIDONE 25 MG PO TABS: 25.0000 mg | ORAL_TABLET | Freq: Every day | ORAL | 0 refills | Status: DC
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
## 2021-11-11 NOTE — Patient Instructions (Signed)
Medication Instructions:  START: CHLORTHALIDONE 25mg  ONCE DAILY  *If you need a refill on your cardiac medications before your next appointment, please call your pharmacy*  Lab Work: None Ordered At This Time. 5 If you have labs (blood work) drawn today and your tests are completely normal, you will receive your results only by: [place] (if you have MyChart) OR A paper copy in the mail If you have any lab test that is abnormal or we need to change your treatment, we will call you to review the results.  Testing/Procedures: None Ordered At This Time.   Follow-Up: At [facility], you and your health needs are our priority.  As part of our continuing mission to provide you with exceptional heart care, we have created designated Provider Care Teams.  These Care Teams include your primary Cardiologist (physician) and Advanced Practice Providers (APPs -  Physician Assistants and Nurse Practitioners) who all work together to provide you with the care you need, when you need it.  Your next appointment:   3 month(s)  The format for your next appointment:   In Person  Provider:   Janina Mayo, MD

## 2021-11-12 LAB — BMP8+EGFR
BUN/Creatinine Ratio: 7 — ABNORMAL LOW (ref 9–20)
BUN: 15 mg/dL (ref 6–24)
CO2: 26 mmol/L (ref 20–29)
Calcium: 9 mg/dL (ref 8.7–10.2)
Chloride: 105 mmol/L (ref 96–106)
Creatinine, Ser: 2.1 mg/dL — ABNORMAL HIGH (ref 0.76–1.27)
Glucose: 76 mg/dL (ref 70–99)
Potassium: 4.2 mmol/L (ref 3.5–5.2)
Sodium: 144 mmol/L (ref 134–144)
eGFR: 36 mL/min/{1.73_m2} — ABNORMAL LOW (ref >59)

## 2021-11-18 ENCOUNTER — Encounter: Payer: Self-pay | Admitting: Pulmonary Disease

## 2021-11-18 ENCOUNTER — Ambulatory Visit (INDEPENDENT_AMBULATORY_CARE_PROVIDER_SITE_OTHER): Payer: 59 | Admitting: Pulmonary Disease

## 2021-11-18 VITALS — BP 120/70 | HR 71 | Ht 75.0 in | Wt 233.6 lb

## 2021-11-18 DIAGNOSIS — Z5181 Encounter for therapeutic drug level monitoring: Secondary | ICD-10-CM | POA: Diagnosis not present

## 2021-11-18 DIAGNOSIS — D860 Sarcoidosis of lung: Secondary | ICD-10-CM | POA: Diagnosis not present

## 2021-11-18 LAB — CBC WITH DIFFERENTIAL/PLATELET
Basophils Absolute: 0.1 10*3/uL (ref 0.0–0.1)
Basophils Relative: 1.3 % (ref 0.0–3.0)
Eosinophils Absolute: 0.2 10*3/uL (ref 0.0–0.7)
Eosinophils Relative: 3.8 % (ref 0.0–5.0)
HCT: 33.8 % — ABNORMAL LOW (ref 39.0–52.0)
Hemoglobin: 11.2 g/dL — ABNORMAL LOW (ref 13.0–17.0)
Lymphocytes Relative: 23.7 % (ref 12.0–46.0)
Lymphs Abs: 1.4 10*3/uL (ref 0.7–4.0)
MCHC: 33.1 g/dL (ref 30.0–36.0)
MCV: 79.9 fl (ref 78.0–100.0)
Monocytes Absolute: 0.6 10*3/uL (ref 0.1–1.0)
Monocytes Relative: 10 % (ref 3.0–12.0)
Neutro Abs: 3.6 10*3/uL (ref 1.4–7.7)
Neutrophils Relative %: 61.2 % (ref 43.0–77.0)
Platelets: 286 10*3/uL (ref 150.0–400.0)
RBC: 4.23 Mil/uL (ref 4.22–5.81)
RDW: 15.5 % (ref 11.5–15.5)
WBC: 5.9 10*3/uL (ref 4.0–10.5)

## 2021-11-18 LAB — COMPREHENSIVE METABOLIC PANEL
ALT: 23 U/L (ref 0–53)
AST: 38 U/L — ABNORMAL HIGH (ref 0–37)
Albumin: 4.2 g/dL (ref 3.5–5.2)
Alkaline Phosphatase: 137 U/L — ABNORMAL HIGH (ref 39–117)
BUN: 28 mg/dL — ABNORMAL HIGH (ref 6–23)
CO2: 30 mEq/L (ref 19–32)
Calcium: 9.3 mg/dL (ref 8.4–10.5)
Chloride: 102 mEq/L (ref 96–112)
Creatinine, Ser: 2.37 mg/dL — ABNORMAL HIGH (ref 0.40–1.50)
GFR: 29.94 mL/min — ABNORMAL LOW (ref >60.00)
Glucose, Bld: 73 mg/dL (ref 70–99)
Potassium: 3.4 mEq/L — ABNORMAL LOW (ref 3.5–5.1)
Sodium: 140 mEq/L (ref 135–145)
Total Bilirubin: 0.7 mg/dL (ref 0.2–1.2)
Total Protein: 7.3 g/dL (ref 6.0–8.3)

## 2021-11-18 NOTE — Addendum Note (Signed)
Addended by: Hedda Slade on: 11/18/2021 02:45 PM   Modules accepted: Orders

## 2021-11-18 NOTE — Progress Notes (Signed)
Luke Davila    [id number]    04-Jun-1965  Primary Care Physician:Stacks, Broadus John, MD  Referring Physician: Mechele Claude, MD [address] [place],  [place] [address]  Chief complaint:  Follow-up for Sarcoidosis Started methotrexate in Aug 2023 Prednisone tapered off October 2023 by October   HPI: 56 y.o. with history of interstitial lung disease, CKD, HTN.  Originally seen as a consult in June 2023 with abnormal CT showing upper lung predominant changes.  This was initially noted in 2021 and was told he had sarcoidosis based on the pattern of interstitial lung disease.  He never had a biopsy done or any specific treatment given.  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 Has increasing dyspnea on exertion with flareups 2-3 times a year treated with prednisone tapers.  He was started on breztri in April 2023  He is here with his ex-wife who helps him with his healthcare needs and appointments  Underwent bronchoscopy with endobronchial ultrasound of lymph nodes on 07/17/21 with findings of granulomatous inflammation consistent with sarcoid.  Cultures are negative.  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  No feather pillows or comforter Smoking history: Never smoker Travel history: No significant travel his Relevant family history: No family history of lung disease  Interim history: Started on methotrexate and is now at 10 mg/week.  Prednisone has been tapered off States that breathing is stable with no issues.  Outpatient Encounter Medications as of 11/18/2021  Medication Sig   albuterol (VENTOLIN HFA) 108 (90 Base) MCG/ACT inhaler Inhale 2 puffs into the lungs every 6 (six) hours as needed for wheezing or shortness of breath. (Patient not taking: Reported on 11/18/2021)    amLODipine (NORVASC) 10 MG tablet Take 1 tablet (10 mg total) by mouth daily.   ARTIFICIAL TEARS 1 % ophthalmic solution Apply 1 drop to eye 2 (two) times daily as needed (dry eyes).   Budeson-Glycopyrrol-Formoterol (BREZTRI AEROSPHERE) 160-9-4.8 MCG/ACT AERO Inhale 2 puffs into the lungs 2 (two) times daily. (Patient not taking: Reported on 11/18/2021)   chlorthalidone (HYGROTON) 25 MG tablet Take 1 tablet (25 mg total) by mouth daily.   folic acid (FOLVITE) 1 MG tablet Take 1 tablet (1 mg total) by mouth daily.   methotrexate (RHEUMATREX) 10 MG tablet Take 1 tablet (10 mg total) by mouth once a week. Caution: Chemotherapy. Protect from light.   rosuvastatin (CRESTOR) 10 MG tablet Take 1 tablet (10 mg total) by mouth daily. For cholesterol   testosterone cypionate (DEPOTESTOSTERONE CYPIONATE) 200 MG/ML injection INJECT 1 ML (200 MG TOTAL) INTO THE MUSCLE EVERY 14 DAYS   Facility-Administered Encounter Medications as of 11/18/2021  Medication   testosterone cypionate (DEPOTESTOSTERONE CYPIONATE) injection 200 mg   testosterone cypionate (DEPOTESTOSTERONE CYPIONATE) injection 200 mg    Physical Exam: Blood pressure 120/70, pulse 71, height 6\' 3"  (1.905 m), weight 233 lb 9.6 oz (106 kg), SpO2 100 %. Gen:      No acute distress HEENT:  EOMI, sclera anicteric Neck:     No masses; no thyromegaly Lungs:    Clear to auscultation bilaterally; normal respiratory effort CV:         Regular rate and rhythm; no murmurs Abd:      +  bowel sounds; soft, non-tender; no palpable masses, no distension Ext:    No edema; adequate peripheral perfusion Skin:      Warm and dry; no rash Neuro: alert and oriented x 3 Psych: normal mood and affect   Data Reviewed: Imaging: CT high-resolution 06/13/2020-unchanged pattern of pulmonary fibrosis with upper lung predominance with peribronchovascular opacity, bronchiolectasis, enlargement Instylan hilar lymph nodes  High-resolution CT 06/28/2021-pulmonary fibrosis  in similar pattern with slight worsening of fibrotic changes.  Hilar, mediastinal lymphadenopathy I reviewed the images personally  PFTs: 06/17/2021 FVC 3.14 [69%], FEV1 2.40 [64%], F/F 76, TLC 4.36 [56%], DLCO 15.23 [47%] Moderate obstruction with bronchodilator response, severe restriction and diffusion impairment  Labs: CTD serologies 06/10/2021-negative Angiotensin-converting enzyme 06/10/2021- 47  Bronchoscopy with endobronchial ultrasound biopsy 07/17/2021 Station 7 lymph node biopsy with granulomatous inflammation.  Lymph node cultures negative to date  Assessment:  Sarcoidosis CT findings are suggestive of sarcoidosis and lymph node biopsy by endobronchial ultrasound confirms granulomatous inflammation. Has finished a prednisone course and is stable on methotrexate.  Continue folic acid He will require long-term therapy given severity of disease, fibrotic changes and also presence of ocular sarcoid.   Check labs today for monitoring Schedule high-res CT and PFTs in 3 months.  Obstructive airway disease Unlikely to be COPD as he is a non-smoker.  He could have asthma versus airway involvement of sarcoid He is doing well on breztri and will continue the same  Plan/Recommendations: Continue breztri inhaler Methotrexate, folic acid Labs for monitoring High-res CT and PFTs in 3 months.  Chilton Greathouse MD [facility] 11/18/2021, 2:23 PM  CC: Mechele Claude, MD

## 2021-11-18 NOTE — Patient Instructions (Signed)
Glad you are stable with your breathing Continue the methotrexate at current dose We will check labs today including CBC and CMP We will schedule high-res CT and PFTs in 3 months Return to clinic after these tests.

## 2021-11-19 ENCOUNTER — Ambulatory Visit: Payer: 59

## 2021-11-21 ENCOUNTER — Other Ambulatory Visit: Payer: Self-pay | Admitting: Pulmonary Disease

## 2021-11-26 ENCOUNTER — Telehealth: Payer: Self-pay | Admitting: *Deleted

## 2021-11-26 ENCOUNTER — Other Ambulatory Visit: Payer: 59

## 2021-11-26 ENCOUNTER — Ambulatory Visit (INDEPENDENT_AMBULATORY_CARE_PROVIDER_SITE_OTHER): Payer: 59 | Admitting: *Deleted

## 2021-11-26 DIAGNOSIS — N1831 Chronic kidney disease, stage 3a: Secondary | ICD-10-CM

## 2021-11-26 DIAGNOSIS — D869 Sarcoidosis, unspecified: Secondary | ICD-10-CM

## 2021-11-26 DIAGNOSIS — E291 Testicular hypofunction: Secondary | ICD-10-CM | POA: Diagnosis not present

## 2021-11-27 LAB — BMP8+EGFR
BUN/Creatinine Ratio: 18 (ref 9–20)
BUN: 46 mg/dL — ABNORMAL HIGH (ref 6–24)
CO2: 25 mmol/L (ref 20–29)
Calcium: 9.7 mg/dL (ref 8.7–10.2)
Chloride: 101 mmol/L (ref 96–106)
Creatinine, Ser: 2.55 mg/dL — ABNORMAL HIGH (ref 0.76–1.27)
Glucose: 76 mg/dL (ref 70–99)
Potassium: 3.8 mmol/L (ref 3.5–5.2)
Sodium: 144 mmol/L (ref 134–144)
eGFR: 29 mL/min/{1.73_m2} — ABNORMAL LOW (ref >59)

## 2021-12-09 ENCOUNTER — Other Ambulatory Visit: Payer: Self-pay | Admitting: Family Medicine

## 2021-12-09 ENCOUNTER — Other Ambulatory Visit: Payer: 59

## 2021-12-09 DIAGNOSIS — D869 Sarcoidosis, unspecified: Secondary | ICD-10-CM

## 2021-12-09 DIAGNOSIS — E291 Testicular hypofunction: Secondary | ICD-10-CM

## 2021-12-09 DIAGNOSIS — N1831 Chronic kidney disease, stage 3a: Secondary | ICD-10-CM

## 2021-12-10 ENCOUNTER — Ambulatory Visit (INDEPENDENT_AMBULATORY_CARE_PROVIDER_SITE_OTHER): Payer: 59 | Admitting: *Deleted

## 2021-12-10 DIAGNOSIS — E291 Testicular hypofunction: Secondary | ICD-10-CM

## 2021-12-10 LAB — BMP8+EGFR
BUN/Creatinine Ratio: 23 — ABNORMAL HIGH (ref 9–20)
BUN: 71 mg/dL — ABNORMAL HIGH (ref 6–24)
CO2: 21 mmol/L (ref 20–29)
Calcium: 9 mg/dL (ref 8.7–10.2)
Chloride: 106 mmol/L (ref 96–106)
Creatinine, Ser: 3.06 mg/dL (ref 0.76–1.27)
Glucose: 87 mg/dL (ref 70–99)
Potassium: 3.9 mmol/L (ref 3.5–5.2)
Sodium: 145 mmol/L — ABNORMAL HIGH (ref 134–144)
eGFR: 23 mL/min/{1.73_m2} — ABNORMAL LOW (ref >59)

## 2021-12-10 NOTE — Progress Notes (Signed)
Testosterone injection given and patient tolerated well.  

## 2021-12-11 ENCOUNTER — Encounter: Payer: Self-pay | Admitting: Family Medicine

## 2021-12-11 DIAGNOSIS — N1831 Chronic kidney disease, stage 3a: Secondary | ICD-10-CM

## 2021-12-24 ENCOUNTER — Ambulatory Visit (INDEPENDENT_AMBULATORY_CARE_PROVIDER_SITE_OTHER): Payer: 59 | Admitting: *Deleted

## 2021-12-24 DIAGNOSIS — D869 Sarcoidosis, unspecified: Secondary | ICD-10-CM

## 2021-12-24 DIAGNOSIS — E291 Testicular hypofunction: Secondary | ICD-10-CM | POA: Diagnosis not present

## 2021-12-24 DIAGNOSIS — N1831 Chronic kidney disease, stage 3a: Secondary | ICD-10-CM

## 2021-12-25 LAB — BMP8+EGFR
BUN/Creatinine Ratio: 11 (ref 9–20)
BUN: 24 mg/dL (ref 6–24)
CO2: 24 mmol/L (ref 20–29)
Calcium: 9.4 mg/dL (ref 8.7–10.2)
Chloride: 102 mmol/L (ref 96–106)
Creatinine, Ser: 2.22 mg/dL — ABNORMAL HIGH (ref 0.76–1.27)
Glucose: 83 mg/dL (ref 70–99)
Potassium: 4 mmol/L (ref 3.5–5.2)
Sodium: 142 mmol/L (ref 134–144)
eGFR: 34 mL/min/{1.73_m2} — ABNORMAL LOW (ref >59)

## 2022-01-08 ENCOUNTER — Other Ambulatory Visit: Payer: 59

## 2022-01-08 ENCOUNTER — Ambulatory Visit (INDEPENDENT_AMBULATORY_CARE_PROVIDER_SITE_OTHER): Payer: 59 | Admitting: *Deleted

## 2022-01-08 DIAGNOSIS — N1831 Chronic kidney disease, stage 3a: Secondary | ICD-10-CM

## 2022-01-08 DIAGNOSIS — E291 Testicular hypofunction: Secondary | ICD-10-CM | POA: Diagnosis not present

## 2022-01-08 DIAGNOSIS — D869 Sarcoidosis, unspecified: Secondary | ICD-10-CM

## 2022-01-08 NOTE — Progress Notes (Signed)
Testosterone injection given, right upper outer quadrant, intramuscular. Patient tolerated well. 

## 2022-01-09 ENCOUNTER — Ambulatory Visit: Payer: 59

## 2022-01-09 LAB — BMP8+EGFR
BUN/Creatinine Ratio: 5 — ABNORMAL LOW (ref 9–20)
BUN: 10 mg/dL (ref 6–24)
CO2: 24 mmol/L (ref 20–29)
Calcium: 8.7 mg/dL (ref 8.7–10.2)
Chloride: 101 mmol/L (ref 96–106)
Creatinine, Ser: 1.97 mg/dL — ABNORMAL HIGH (ref 0.76–1.27)
Glucose: 89 mg/dL (ref 70–99)
Potassium: 3.9 mmol/L (ref 3.5–5.2)
Sodium: 139 mmol/L (ref 134–144)
eGFR: 39 mL/min/{1.73_m2} — ABNORMAL LOW (ref >59)

## 2022-01-16 ENCOUNTER — Other Ambulatory Visit: Payer: Self-pay | Admitting: Physician Assistant

## 2022-01-19 ENCOUNTER — Other Ambulatory Visit: Payer: Self-pay | Admitting: Pulmonary Disease

## 2022-01-20 ENCOUNTER — Ambulatory Visit: Payer: 59

## 2022-01-20 ENCOUNTER — Ambulatory Visit: Payer: 59 | Admitting: Pulmonary Disease

## 2022-01-20 DIAGNOSIS — D860 Sarcoidosis of lung: Secondary | ICD-10-CM | POA: Diagnosis not present

## 2022-01-20 LAB — PULMONARY FUNCTION TEST
DL/VA % pred: 75 %
DL/VA: 3.2 ml/min/mmHg/L
DLCO cor % pred: 41 %
DLCO cor: 13.09 ml/min/mmHg
DLCO unc % pred: 41 %
DLCO unc: 13.09 ml/min/mmHg
FEF 25-75 Post: 2.6 L/sec
FEF 25-75 Pre: 1.05 L/sec
FEF2575-%Change-Post: 147 %
FEF2575-%Pred-Post: 73 %
FEF2575-%Pred-Pre: 29 %
FEV1-%Change-Post: 35 %
FEV1-%Pred-Post: 57 %
FEV1-%Pred-Pre: 42 %
FEV1-Post: 2.42 L
FEV1-Pre: 1.78 L
FEV1FVC-%Change-Post: 17 %
FEV1FVC-%Pred-Pre: 85 %
FEV6-%Change-Post: 18 %
FEV6-%Pred-Post: 59 %
FEV6-%Pred-Pre: 50 %
FEV6-Post: 3.16 L
FEV6-Pre: 2.66 L
FEV6FVC-%Change-Post: 1 %
FEV6FVC-%Pred-Post: 104 %
FEV6FVC-%Pred-Pre: 102 %
FVC-%Change-Post: 15 %
FVC-%Pred-Post: 57 %
FVC-%Pred-Pre: 49 %
FVC-Post: 3.16 L
FVC-Pre: 2.74 L
Post FEV1/FVC ratio: 76 %
Post FEV6/FVC ratio: 100 %
Pre FEV1/FVC ratio: 65 %
Pre FEV6/FVC Ratio: 99 %
RV % pred: 70 %
RV: 1.67 L
TLC % pred: 60 %
TLC: 4.64 L

## 2022-01-20 NOTE — Progress Notes (Signed)
PFT done today. 

## 2022-01-22 ENCOUNTER — Ambulatory Visit (INDEPENDENT_AMBULATORY_CARE_PROVIDER_SITE_OTHER): Payer: 59 | Admitting: *Deleted

## 2022-01-22 DIAGNOSIS — E291 Testicular hypofunction: Secondary | ICD-10-CM | POA: Diagnosis not present

## 2022-01-22 DIAGNOSIS — N1831 Chronic kidney disease, stage 3a: Secondary | ICD-10-CM

## 2022-01-22 DIAGNOSIS — D869 Sarcoidosis, unspecified: Secondary | ICD-10-CM

## 2022-01-22 NOTE — Progress Notes (Signed)
Testosterone injection given, left upper outer quadrant, intramuscular, patient supplied.  Patient tolerated well

## 2022-01-23 LAB — BMP8+EGFR
BUN/Creatinine Ratio: 9 (ref 9–20)
BUN: 11 mg/dL (ref 6–24)
CO2: 24 mmol/L (ref 20–29)
Calcium: 9.3 mg/dL (ref 8.7–10.2)
Chloride: 105 mmol/L (ref 96–106)
Creatinine, Ser: 1.29 mg/dL — ABNORMAL HIGH (ref 0.76–1.27)
Glucose: 76 mg/dL (ref 70–99)
Potassium: 4.2 mmol/L (ref 3.5–5.2)
Sodium: 143 mmol/L (ref 134–144)
eGFR: 65 mL/min/{1.73_m2} (ref >59)

## 2022-01-28 ENCOUNTER — Encounter: Payer: Self-pay | Admitting: Pulmonary Disease

## 2022-01-28 ENCOUNTER — Encounter: Payer: Self-pay | Admitting: Family Medicine

## 2022-01-28 DIAGNOSIS — J8410 Pulmonary fibrosis, unspecified: Secondary | ICD-10-CM

## 2022-01-28 MED ORDER — ALBUTEROL SULFATE HFA 108 (90 BASE) MCG/ACT IN AERS: 2.0000 | INHALATION_SPRAY | Freq: Four times a day (QID) | RESPIRATORY_TRACT | 2 refills | Status: DC | PRN

## 2022-01-30 MED ORDER — METHOTREXATE SODIUM 2.5 MG PO TABS: 10.0000 mg | ORAL_TABLET | ORAL | 3 refills | Status: DC

## 2022-02-03 ENCOUNTER — Other Ambulatory Visit: Payer: 59

## 2022-02-03 DIAGNOSIS — N1831 Chronic kidney disease, stage 3a: Secondary | ICD-10-CM

## 2022-02-03 DIAGNOSIS — D869 Sarcoidosis, unspecified: Secondary | ICD-10-CM

## 2022-02-04 ENCOUNTER — Encounter: Payer: Self-pay | Admitting: Family Medicine

## 2022-02-04 LAB — BMP8+EGFR
BUN/Creatinine Ratio: 7 — ABNORMAL LOW (ref 9–20)
BUN: 10 mg/dL (ref 6–24)
CO2: 25 mmol/L (ref 20–29)
Calcium: 8.6 mg/dL — ABNORMAL LOW (ref 8.7–10.2)
Chloride: 104 mmol/L (ref 96–106)
Creatinine, Ser: 1.46 mg/dL — ABNORMAL HIGH (ref 0.76–1.27)
Glucose: 77 mg/dL (ref 70–99)
Potassium: 4.4 mmol/L (ref 3.5–5.2)
Sodium: 142 mmol/L (ref 134–144)
eGFR: 56 mL/min/{1.73_m2} — ABNORMAL LOW (ref >59)

## 2022-02-04 NOTE — Telephone Encounter (Signed)
Forwarded as requested.

## 2022-02-06 ENCOUNTER — Other Ambulatory Visit: Payer: Self-pay | Admitting: Internal Medicine

## 2022-02-07 ENCOUNTER — Encounter: Payer: Self-pay | Admitting: Internal Medicine

## 2022-02-07 DIAGNOSIS — Z862 Personal history of diseases of the blood and blood-forming organs and certain disorders involving the immune mechanism: Secondary | ICD-10-CM | POA: Insufficient documentation

## 2022-02-07 DIAGNOSIS — N189 Chronic kidney disease, unspecified: Secondary | ICD-10-CM | POA: Insufficient documentation

## 2022-02-10 ENCOUNTER — Ambulatory Visit: Payer: 59 | Admitting: Internal Medicine

## 2022-02-10 ENCOUNTER — Ambulatory Visit
Admission: RE | Admit: 2022-02-10 | Discharge: 2022-02-10 | Disposition: A | Discharge status: Home / Self Care | Payer: 59 | Source: Ambulatory Visit | Attending: Internal Medicine | Admitting: Internal Medicine

## 2022-02-10 ENCOUNTER — Encounter: Payer: Self-pay | Admitting: Internal Medicine

## 2022-02-10 VITALS — BP 143/62 | HR 66 | Temp 97.7°F | Resp 17

## 2022-02-10 VITALS — BP 132/84 | HR 66 | Ht 75.0 in | Wt 233.4 lb

## 2022-02-10 DIAGNOSIS — Z862 Personal history of diseases of the blood and blood-forming organs and certain disorders involving the immune mechanism: Secondary | ICD-10-CM | POA: Diagnosis present

## 2022-02-10 DIAGNOSIS — I10 Essential (primary) hypertension: Secondary | ICD-10-CM | POA: Diagnosis present

## 2022-02-10 DIAGNOSIS — N189 Chronic kidney disease, unspecified: Secondary | ICD-10-CM | POA: Insufficient documentation

## 2022-02-10 LAB — POCT HEMOGLOBIN-HEMACUE: Hemoglobin: 9.8 g/dL — ABNORMAL LOW (ref 13.0–17.0)

## 2022-02-10 MED ORDER — EPOETIN ALFA-EPBX 10000 UNIT/ML IJ SOLN: 10000.0000 [IU] | INTRAMUSCULAR | Status: DC

## 2022-02-10 MED ORDER — EPOETIN ALFA-EPBX 10000 UNIT/ML IJ SOLN
INTRAMUSCULAR | Status: AC
  Administered 2022-02-10: 10000 [IU] via SUBCUTANEOUS
  Filled 2022-02-10: qty 1

## 2022-02-10 NOTE — Patient Instructions (Signed)
Medication Instructions:  Your physician recommends that you continue on your current medications as directed. Please refer to the Current Medication list given to you today.  *If you need a refill on your cardiac medications before your next appointment, please call your pharmacy*   Lab Work: NONE If you have labs (blood work) drawn today and your tests are completely normal, you will receive your results only by: MyChart Message (if you have MyChart) OR A paper copy in the mail If you have any lab test that is abnormal or we need to change your treatment, we will call you to review the results.   Testing/Procedures: NONE    Follow-Up: At [facility], you and your health needs are our priority.  As part of our continuing mission to provide you with exceptional heart care, we have created designated Provider Care Teams.  These Care Teams include your primary Cardiologist (physician) and Advanced Practice Providers (APPs -  Physician Assistants and Nurse Practitioners) who all work together to provide you with the care you need, when you need it.  We recommend signing up for the patient portal called "MyChart".  Sign up information is provided on this After Visit Summary.  MyChart is used to connect with patients for Virtual Visits (Telemedicine).  Patients are able to view lab/test results, encounter notes, upcoming appointments, etc.  Non-urgent messages can be sent to your provider as well.   To learn more about what you can do with MyChart, go to [email or web address].    Your next appointment:   1 year(s)  Provider:   Branch, Mary E, MD   
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
## 2022-02-17 ENCOUNTER — Other Ambulatory Visit: Payer: 59

## 2022-02-17 ENCOUNTER — Other Ambulatory Visit: Payer: Self-pay | Admitting: *Deleted

## 2022-02-17 ENCOUNTER — Other Ambulatory Visit: Payer: Self-pay | Admitting: Family Medicine

## 2022-02-17 DIAGNOSIS — R748 Abnormal levels of other serum enzymes: Secondary | ICD-10-CM

## 2022-02-17 DIAGNOSIS — E291 Testicular hypofunction: Secondary | ICD-10-CM

## 2022-02-17 DIAGNOSIS — E782 Mixed hyperlipidemia: Secondary | ICD-10-CM

## 2022-02-17 DIAGNOSIS — N189 Chronic kidney disease, unspecified: Secondary | ICD-10-CM

## 2022-02-17 LAB — LIPID PANEL

## 2022-02-19 ENCOUNTER — Encounter (HOSPITAL_BASED_OUTPATIENT_CLINIC_OR_DEPARTMENT_OTHER): Payer: Self-pay

## 2022-02-19 ENCOUNTER — Ambulatory Visit (HOSPITAL_BASED_OUTPATIENT_CLINIC_OR_DEPARTMENT_OTHER)
Admission: RE | Admit: 2022-02-19 | Discharge: 2022-02-19 | Disposition: A | Discharge status: Home / Self Care | Payer: 59 | Source: Ambulatory Visit | Attending: Pulmonary Disease | Admitting: Pulmonary Disease

## 2022-02-19 DIAGNOSIS — D860 Sarcoidosis of lung: Secondary | ICD-10-CM | POA: Insufficient documentation

## 2022-02-24 ENCOUNTER — Encounter (HOSPITAL_COMMUNITY)
Admission: RE | Admit: 2022-02-24 | Discharge: 2022-02-24 | Disposition: A | Discharge status: Home / Self Care | Payer: 59 | Source: Ambulatory Visit | Attending: Internal Medicine | Admitting: Internal Medicine

## 2022-02-24 VITALS — BP 143/69 | HR 72 | Temp 97.2°F | Resp 16

## 2022-02-24 DIAGNOSIS — N189 Chronic kidney disease, unspecified: Secondary | ICD-10-CM | POA: Insufficient documentation

## 2022-02-24 DIAGNOSIS — Z862 Personal history of diseases of the blood and blood-forming organs and certain disorders involving the immune mechanism: Secondary | ICD-10-CM | POA: Insufficient documentation

## 2022-02-24 LAB — POCT HEMOGLOBIN-HEMACUE: Hemoglobin: 10.5 g/dL — ABNORMAL LOW (ref 13.0–17.0)

## 2022-02-24 MED ORDER — EPOETIN ALFA-EPBX 10000 UNIT/ML IJ SOLN
INTRAMUSCULAR | Status: AC
  Filled 2022-02-24: qty 1

## 2022-02-24 MED ORDER — EPOETIN ALFA-EPBX 10000 UNIT/ML IJ SOLN
10000.0000 [IU] | INTRAMUSCULAR | Status: DC
  Administered 2022-02-24: 10000 [IU] via SUBCUTANEOUS

## 2022-02-25 ENCOUNTER — Encounter: Payer: Self-pay | Admitting: Family Medicine

## 2022-02-25 LAB — CBC WITH DIFFERENTIAL/PLATELET
Basophils Absolute: 0.1 10*3/uL (ref 0.0–0.2)
Basos: 1 %
EOS (ABSOLUTE): 0.4 10*3/uL (ref 0.0–0.4)
Eos: 8 %
Hematocrit: 31.8 % — ABNORMAL LOW (ref 37.5–51.0)
Hemoglobin: 10.2 g/dL — ABNORMAL LOW (ref 13.0–17.7)
Immature Grans (Abs): 0 10*3/uL (ref 0.0–0.1)
Immature Granulocytes: 0 %
Lymphocytes Absolute: 1.3 10*3/uL (ref 0.7–3.1)
Lymphs: 27 %
MCH: 27.3 pg (ref 26.6–33.0)
MCHC: 32.1 g/dL (ref 31.5–35.7)
MCV: 85 fL (ref 79–97)
Monocytes Absolute: 0.7 10*3/uL (ref 0.1–0.9)
Monocytes: 15 %
Neutrophils Absolute: 2.3 10*3/uL (ref 1.4–7.0)
Neutrophils: 49 %
Platelets: 233 10*3/uL (ref 150–450)
RBC: 3.74 x10E6/uL — ABNORMAL LOW (ref 4.14–5.80)
RDW: 13.4 % (ref 11.6–15.4)
WBC: 4.7 10*3/uL (ref 3.4–10.8)

## 2022-02-25 LAB — CMP14+EGFR
ALT: 23 IU/L (ref 0–44)
AST: 45 IU/L — ABNORMAL HIGH (ref 0–40)
Albumin/Globulin Ratio: 1.6 (ref 1.2–2.2)
Albumin: 4 g/dL (ref 3.8–4.9)
Alkaline Phosphatase: 136 IU/L — ABNORMAL HIGH (ref 44–121)
BUN/Creatinine Ratio: 10 (ref 9–20)
BUN: 15 mg/dL (ref 6–24)
Bilirubin Total: 0.7 mg/dL (ref 0.0–1.2)
CO2: 21 mmol/L (ref 20–29)
Calcium: 8.9 mg/dL (ref 8.7–10.2)
Chloride: 102 mmol/L (ref 96–106)
Creatinine, Ser: 1.53 mg/dL — ABNORMAL HIGH (ref 0.76–1.27)
Globulin, Total: 2.5 g/dL (ref 1.5–4.5)
Glucose: 50 mg/dL — ABNORMAL LOW (ref 70–99)
Potassium: 4 mmol/L (ref 3.5–5.2)
Sodium: 136 mmol/L (ref 134–144)
Total Protein: 6.5 g/dL (ref 6.0–8.5)
eGFR: 53 mL/min/{1.73_m2} — ABNORMAL LOW (ref >59)

## 2022-02-25 LAB — LIPID PANEL
Chol/HDL Ratio: 2.6 ratio (ref 0.0–5.0)
Cholesterol, Total: 150 mg/dL (ref 100–199)
HDL: 58 mg/dL (ref >39)
LDL Chol Calc (NIH): 79 mg/dL (ref 0–99)
Triglycerides: 64 mg/dL (ref 0–149)
VLDL Cholesterol Cal: 13 mg/dL (ref 5–40)

## 2022-02-25 LAB — TESTOSTERONE,FREE AND TOTAL
Testosterone, Free: 0.7 pg/mL — ABNORMAL LOW (ref 7.2–24.0)
Testosterone: 236 ng/dL — ABNORMAL LOW (ref 264–916)

## 2022-02-28 ENCOUNTER — Telehealth: Payer: 59

## 2022-03-04 ENCOUNTER — Ambulatory Visit (INDEPENDENT_AMBULATORY_CARE_PROVIDER_SITE_OTHER): Payer: 59

## 2022-03-04 ENCOUNTER — Other Ambulatory Visit: Payer: 59

## 2022-03-04 DIAGNOSIS — E291 Testicular hypofunction: Secondary | ICD-10-CM | POA: Diagnosis not present

## 2022-03-04 DIAGNOSIS — D869 Sarcoidosis, unspecified: Secondary | ICD-10-CM

## 2022-03-04 DIAGNOSIS — N1831 Chronic kidney disease, stage 3a: Secondary | ICD-10-CM

## 2022-03-04 NOTE — Progress Notes (Signed)
Testosterone injection given to right upper outer quadrant.  Patient tolerated well.

## 2022-03-05 LAB — BMP8+EGFR
BUN/Creatinine Ratio: 24 — ABNORMAL HIGH (ref 9–20)
BUN: 36 mg/dL — ABNORMAL HIGH (ref 6–24)
CO2: 19 mmol/L — ABNORMAL LOW (ref 20–29)
Calcium: 9.7 mg/dL (ref 8.7–10.2)
Chloride: 104 mmol/L (ref 96–106)
Creatinine, Ser: 1.47 mg/dL — ABNORMAL HIGH (ref 0.76–1.27)
Glucose: 103 mg/dL — ABNORMAL HIGH (ref 70–99)
Potassium: 4.4 mmol/L (ref 3.5–5.2)
Sodium: 140 mmol/L (ref 134–144)
eGFR: 56 mL/min/{1.73_m2} — ABNORMAL LOW (ref >59)

## 2022-03-10 ENCOUNTER — Encounter (HOSPITAL_COMMUNITY)
Admission: RE | Admit: 2022-03-10 | Discharge: 2022-03-10 | Disposition: A | Discharge status: Home / Self Care | Payer: 59 | Source: Ambulatory Visit | Attending: Internal Medicine | Admitting: Internal Medicine

## 2022-03-10 VITALS — BP 112/69 | HR 78 | Temp 97.0°F | Resp 16

## 2022-03-10 DIAGNOSIS — N189 Chronic kidney disease, unspecified: Secondary | ICD-10-CM | POA: Diagnosis present

## 2022-03-10 DIAGNOSIS — Z862 Personal history of diseases of the blood and blood-forming organs and certain disorders involving the immune mechanism: Secondary | ICD-10-CM | POA: Diagnosis present

## 2022-03-10 LAB — IRON AND TIBC
Iron: 54 ug/dL (ref 45–182)
Saturation Ratios: 19 % (ref 17.9–39.5)
TIBC: 288 ug/dL (ref 250–450)
UIBC: 234 ug/dL

## 2022-03-10 LAB — POCT HEMOGLOBIN-HEMACUE: Hemoglobin: 11.2 g/dL — ABNORMAL LOW (ref 13.0–17.0)

## 2022-03-10 LAB — FERRITIN: Ferritin: 170 ng/mL (ref 24–336)

## 2022-03-10 MED ORDER — EPOETIN ALFA-EPBX 10000 UNIT/ML IJ SOLN
INTRAMUSCULAR | Status: AC
  Filled 2022-03-10: qty 1

## 2022-03-10 MED ORDER — EPOETIN ALFA-EPBX 10000 UNIT/ML IJ SOLN
10000.0000 [IU] | INTRAMUSCULAR | Status: DC
  Administered 2022-03-10: 10000 [IU] via SUBCUTANEOUS

## 2022-03-13 ENCOUNTER — Ambulatory Visit: Payer: 59 | Admitting: Pulmonary Disease

## 2022-03-13 ENCOUNTER — Encounter: Payer: Self-pay | Admitting: Pulmonary Disease

## 2022-03-13 VITALS — BP 112/60 | HR 76 | Temp 98.0°F | Ht 75.0 in | Wt 231.8 lb

## 2022-03-13 DIAGNOSIS — D860 Sarcoidosis of lung: Secondary | ICD-10-CM

## 2022-03-13 DIAGNOSIS — Z5181 Encounter for therapeutic drug level monitoring: Secondary | ICD-10-CM

## 2022-03-13 MED ORDER — METHOTREXATE SODIUM 15 MG PO TABS: 15.0000 mg | ORAL_TABLET | ORAL | 5 refills | Status: DC

## 2022-03-13 NOTE — Patient Instructions (Signed)
I am glad you are stable with your breathing His CT and lung function tests are stable as well Will increase methotrexate to 15 mg/week and send in a new prescription Continue folic acid  Return to clinic in 3 months.

## 2022-03-13 NOTE — Progress Notes (Signed)
Luke Davila    [id number]    1965-11-01  Primary Care Physician:Stacks, Cletus Gash, MD  Referring Physician: Claretta Fraise, MD [place],  [place] [address]  Chief complaint:  Follow-up for Sarcoidosis Started methotrexate in Aug 2023 Prednisone tapered off October 2023 by October  HPI: 57 y.o. with history of interstitial lung disease, CKD, HTN.  Originally seen as a consult in June 2023 with abnormal CT showing upper lung predominant changes.  This was initially noted in 2021 and was told he had sarcoidosis based on the pattern of interstitial lung disease.  He never had a biopsy done or any specific treatment given.  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 Has increasing dyspnea on exertion with flareups 2-3 times a year treated with prednisone tapers.  He was started on breztri in April 2023  He is here with his ex-wife who helps him with his healthcare needs and appointments  Underwent bronchoscopy with endobronchial ultrasound of lymph nodes on 07/17/21 with findings of granulomatous inflammation consistent with sarcoid.  Cultures are negative.  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  No feather pillows or comforter Smoking history: Never smoker Travel history: No significant travel his Relevant family history: No family history of lung disease  Interim history: Continues on methotrexate at 10 mg/week.  Here for review of CT and PFTs.  States that breathing is stable but occasionally uses prednisone for cough  Outpatient Encounter Medications as of 03/13/2022  Medication Sig   albuterol (VENTOLIN HFA) 108 (90 Base) MCG/ACT inhaler Inhale 2 puffs into the lungs every 6 (six) hours as needed for wheezing or shortness of  breath.   Epoetin Alfa-epbx (RETACRIT IJ) Inject as directed. Every 2 weeks   folic acid (FOLVITE) 1 MG tablet TAKE 1 TABLET BY MOUTH EVERY DAY   methotrexate (RHEUMATREX) 2.5 MG tablet Take 4 tablets (10 mg total) by mouth once a week. Caution:Chemotherapy. Protect from light.   rosuvastatin (CRESTOR) 10 MG tablet Take 1 tablet (10 mg total) by mouth daily. (NEEDS TO BE SEEN BEFORE NEXT REFILL)   tamsulosin (FLOMAX) 0.4 MG CAPS capsule    testosterone cypionate (DEPOTESTOSTERONE CYPIONATE) 200 MG/ML injection INJECT 1 ML (200 MG TOTAL) INTO THE MUSCLE EVERY 14 DAYS   [DISCONTINUED] amLODipine (NORVASC) 10 MG tablet TAKE 1 TABLET BY MOUTH EVERY DAY (Patient not taking: Reported on 02/10/2022)   [DISCONTINUED] ARTIFICIAL TEARS 1 % ophthalmic solution Apply 1 drop to eye 2 (two) times daily as needed (dry eyes).   [DISCONTINUED] Budeson-Glycopyrrol-Formoterol (BREZTRI AEROSPHERE) 160-9-4.8 MCG/ACT AERO Inhale 2 puffs into the lungs 2 (two) times daily.   [DISCONTINUED] chlorthalidone (HYGROTON) 25 MG tablet TAKE 1 TABLET (25 MG TOTAL) BY MOUTH DAILY. (Patient not taking: Reported on 02/10/2022)   Facility-Administered Encounter Medications as of 03/13/2022  Medication   testosterone cypionate (DEPOTESTOSTERONE CYPIONATE) injection 200 mg   testosterone cypionate (DEPOTESTOSTERONE CYPIONATE) injection 200 mg    Physical Exam: Blood pressure 112/60, pulse 76, temperature 98 F (36.7 C), temperature source Oral, height '6\' 3"'$  (1.905 m), weight 231 lb 12.8 oz (105.1 kg), SpO2 94 %. Gen:      No acute distress HEENT:  EOMI, sclera  anicteric Neck:     No masses; no thyromegaly Lungs:    Clear to auscultation bilaterally; normal respiratory effort CV:         Regular rate and rhythm; no murmurs Abd:      + bowel sounds; soft, non-tender; no palpable masses, no distension Ext:    No edema; adequate peripheral perfusion Skin:      Warm and dry; no rash Neuro: alert and oriented x 3 Psych: normal mood and  affect   Data Reviewed: Imaging: CT high-resolution 06/13/2020-unchanged pattern of pulmonary fibrosis with upper lung predominance with peribronchovascular opacity, bronchiolectasis, enlargement Instylan hilar lymph nodes  High-resolution CT 06/28/2021-pulmonary fibrosis in similar pattern with slight worsening of fibrotic changes.  Hilar, mediastinal lymphadenopathy  High resolution CT 02/19/2022-stable pattern of pulmonary fibrosis, changes of sarcoidosis I reviewed the images personally  PFTs: 06/17/2021 FVC 3.14 [69%], FEV1 2.40 [64%], F/F 76, TLC 4.36 [56%], DLCO 15.23 [47%] Moderate obstruction with bronchodilator response, severe restriction and diffusion impairment  01/20/2022 FVC 3.16 [57%], FEV1 2.42 [54%], TLC 4.64 [60%], DLCO 13.09 [41%] Moderate obstruction, severe restriction and diffusion impairment  Labs: CTD serologies 06/10/2021-negative Angiotensin-converting enzyme 06/10/2021- 47  Bronchoscopy with endobronchial ultrasound biopsy 07/17/2021 Station 7 lymph node biopsy with granulomatous inflammation.  Lymph node cultures negative to date  Assessment:  Sarcoidosis CT findings are suggestive of sarcoidosis and lymph node biopsy by endobronchial ultrasound confirms granulomatous inflammation. Has finished a prednisone course and is stable on methotrexate.  Continue folic acid He will require long-term therapy given severity of disease, fibrotic changes and also presence of ocular sarcoid.   CBC last month was normal High-res send CT reviewed with stable findings Will increase methotrexate to 15 mg/week as he still has occasional episodes of cough requiring steroids.  Will eventually titrate up to 20 mg/day  Obstructive airway disease Unlikely to be COPD as he is a non-smoker.  He could have asthma versus airway involvement of sarcoid He is doing well on breztri and will continue the same  Plan/Recommendations: Continue breztri inhaler Methotrexate.  Increase dose to  15 mg/week Folic acid Labs for monitoring  Marshell Garfinkel MD [facility] 03/13/2022, 3:32 PM  CC: Claretta Fraise, MD

## 2022-03-17 ENCOUNTER — Encounter: Payer: Self-pay | Admitting: Pulmonary Disease

## 2022-03-17 MED ORDER — METHOTREXATE SODIUM 2.5 MG PO TABS: 15.0000 mg | ORAL_TABLET | ORAL | 4 refills | Status: DC

## 2022-03-18 ENCOUNTER — Ambulatory Visit: Payer: 59 | Admitting: Family Medicine

## 2022-03-18 ENCOUNTER — Ambulatory Visit (INDEPENDENT_AMBULATORY_CARE_PROVIDER_SITE_OTHER): Payer: 59 | Admitting: *Deleted

## 2022-03-18 ENCOUNTER — Other Ambulatory Visit: Payer: Self-pay | Admitting: *Deleted

## 2022-03-18 DIAGNOSIS — D869 Sarcoidosis, unspecified: Secondary | ICD-10-CM

## 2022-03-18 DIAGNOSIS — N1831 Chronic kidney disease, stage 3a: Secondary | ICD-10-CM

## 2022-03-18 DIAGNOSIS — E291 Testicular hypofunction: Secondary | ICD-10-CM

## 2022-03-18 LAB — BMP8+EGFR
BUN/Creatinine Ratio: 10 (ref 9–20)
BUN: 13 mg/dL (ref 6–24)
CO2: 24 mmol/L (ref 20–29)
Calcium: 9.4 mg/dL (ref 8.7–10.2)
Chloride: 101 mmol/L (ref 96–106)
Creatinine, Ser: 1.31 mg/dL — ABNORMAL HIGH (ref 0.76–1.27)
Glucose: 79 mg/dL (ref 70–99)
Potassium: 4.3 mmol/L (ref 3.5–5.2)
Sodium: 139 mmol/L (ref 134–144)
eGFR: 64 mL/min/{1.73_m2} (ref >59)

## 2022-03-18 NOTE — Progress Notes (Signed)
Patient in today for Testosterone injection. 200 mg given IM in left upper outer quadrant. Patient tolerated well.

## 2022-03-19 ENCOUNTER — Ambulatory Visit: Payer: 59

## 2022-03-19 NOTE — Progress Notes (Signed)
Hello Luke Davila,  Your lab result is normal and/or stable.Some minor variations that are not significant are commonly marked abnormal, but do not represent any medical problem for you.  Best regards, Jaiquan Temme, M.D.

## 2022-03-21 ENCOUNTER — Other Ambulatory Visit (HOSPITAL_COMMUNITY): Payer: Self-pay | Admitting: *Deleted

## 2022-03-23 ENCOUNTER — Encounter: Payer: Self-pay | Admitting: Family Medicine

## 2022-03-24 ENCOUNTER — Encounter (HOSPITAL_COMMUNITY)
Admission: RE | Admit: 2022-03-24 | Discharge: 2022-03-24 | Disposition: A | Discharge status: Home / Self Care | Payer: 59 | Source: Ambulatory Visit | Attending: Internal Medicine | Admitting: Internal Medicine

## 2022-03-24 VITALS — BP 122/82 | HR 102 | Temp 97.1°F | Resp 17

## 2022-03-24 DIAGNOSIS — N189 Chronic kidney disease, unspecified: Secondary | ICD-10-CM | POA: Diagnosis not present

## 2022-03-24 LAB — POCT HEMOGLOBIN-HEMACUE: Hemoglobin: 11.5 g/dL — ABNORMAL LOW (ref 13.0–17.0)

## 2022-03-24 MED ORDER — EPOETIN ALFA-EPBX 10000 UNIT/ML IJ SOLN
10000.0000 [IU] | INTRAMUSCULAR | Status: DC
  Administered 2022-03-24: 10000 [IU] via SUBCUTANEOUS

## 2022-03-24 MED ORDER — EPOETIN ALFA-EPBX 10000 UNIT/ML IJ SOLN
INTRAMUSCULAR | Status: AC
  Filled 2022-03-24: qty 1

## 2022-03-25 ENCOUNTER — Ambulatory Visit: Payer: 59 | Admitting: Family Medicine

## 2022-03-25 ENCOUNTER — Encounter: Payer: Self-pay | Admitting: Family Medicine

## 2022-03-25 VITALS — BP 96/62 | HR 91 | Temp 97.9°F | Ht 75.0 in | Wt 230.4 lb

## 2022-03-25 DIAGNOSIS — I700 Atherosclerosis of aorta: Secondary | ICD-10-CM | POA: Diagnosis not present

## 2022-03-25 DIAGNOSIS — E291 Testicular hypofunction: Secondary | ICD-10-CM

## 2022-03-25 DIAGNOSIS — D860 Sarcoidosis of lung: Secondary | ICD-10-CM | POA: Diagnosis not present

## 2022-03-25 DIAGNOSIS — E782 Mixed hyperlipidemia: Secondary | ICD-10-CM

## 2022-03-25 DIAGNOSIS — Z125 Encounter for screening for malignant neoplasm of prostate: Secondary | ICD-10-CM | POA: Diagnosis not present

## 2022-03-25 MED ORDER — ESCITALOPRAM OXALATE 10 MG PO TABS: 10.0000 mg | ORAL_TABLET | Freq: Every day | ORAL | 1 refills | Status: DC

## 2022-03-25 MED ORDER — SILDENAFIL CITRATE 100 MG PO TABS: 50.0000 mg | ORAL_TABLET | Freq: Every day | ORAL | 11 refills | Status: DC | PRN

## 2022-03-25 NOTE — Progress Notes (Signed)
Subjective:  Patient ID: Luke Davila, male    DOB: 01/26/1965  Age: 57 y.o. MRN: [id number]  CC: Medical Management of Chronic Issues   HPI AEDIN SAUCER presents for check up. Still monitoring renal function due to periodic exacerbations. Having to self catheterize. Kidney function has been better recently. Off testosterone when labs wer drawn on Feb. 12. Now back on for 3 cycles.   Feeling sad, depressed. Unable to have an erection. Makes it worse.      03/25/2022    4:08 PM 03/25/2022    4:00 PM 08/19/2021    3:50 PM  Depression screen PHQ 2/9  Decreased Interest 1 0 2  Down, Depressed, Hopeless 0 0 1  PHQ - 2 Score 1 0 3  Altered sleeping 3  3  Tired, decreased energy 3  0  Change in appetite 0  0  Feeling bad or failure about yourself  0  0  Trouble concentrating 0  0  Moving slowly or fidgety/restless 3  3  Suicidal thoughts 0  0  PHQ-9 Score 10  9  Difficult doing work/chores Extremely dIfficult  Somewhat difficult    History Jailon has a past medical history of Anemia of chronic renal failure, unspecified CKD stage, Arthritis, Avascular necrosis of hip ([place]), Enlarged prostate, Glaucoma, Gout, History of gout, and Pneumonia.   He has a past surgical history that includes Foot surgery (Left, 01/07/1983); Left hand surgery; Total hip arthroplasty (Left, 04/09/2015); Colonoscopy with propofol (N/A, 11/27/2015); Wisdom tooth extraction; Transurethral resection of prostate (N/A, 03/27/2021); Endobronchial ultrasound (Bilateral, 07/17/2021); Fine needle aspiration (07/17/2021); and Video bronchoscopy (07/17/2021).   His family history includes Hypertension in his mother.He reports that he has never smoked. He has never used smokeless tobacco. He reports current alcohol use. He reports that he does not use drugs.    ROS Review of Systems  Constitutional:  Negative for fever.  Respiratory:  Negative for shortness of breath.   Cardiovascular:  Negative for chest pain.   Genitourinary:  Negative for difficulty urinating and frequency.  Musculoskeletal:  Negative for arthralgias.  Skin:  Negative for rash.    Objective:  BP 96/62   Pulse 91   Temp 97.9 F (36.6 C)   Ht 6\' 3"  (1.905 m)   Wt 230 lb 6.4 oz (104.5 kg)   SpO2 98%   BMI 28.80 kg/m   BP Readings from Last 3 Encounters:  03/25/22 96/62  03/24/22 122/82  03/13/22 112/60    Wt Readings from Last 3 Encounters:  03/25/22 230 lb 6.4 oz (104.5 kg)  03/13/22 231 lb 12.8 oz (105.1 kg)  02/10/22 233 lb 6.4 oz (105.9 kg)     Physical Exam Vitals reviewed.  Constitutional:      Appearance: He is well-developed.  HENT:     Head: Normocephalic and atraumatic.     Right Ear: External ear normal.     Left Ear: External ear normal.     Mouth/Throat:     Pharynx: No oropharyngeal exudate or posterior oropharyngeal erythema.  Eyes:     Pupils: Pupils are equal, round, and reactive to light.  Cardiovascular:     Rate and Rhythm: Normal rate and regular rhythm.     Heart sounds: No murmur heard. Pulmonary:     Effort: No respiratory distress.     Breath sounds: Normal breath sounds.  Musculoskeletal:     Cervical back: Normal range of motion and neck supple.  Neurological:  Mental Status: He is alert and oriented to person, place, and time.       Assessment & Plan:   Baptiste was seen today for medical management of chronic issues.  Diagnoses and all orders for this visit:  Hypogonadism in male -     Testosterone,Free and Total -     CBC with Differential/Platelet -     CMP14+EGFR  Atherosclerosis of aorta (HCC)  Sarcoidosis of lung (HCC) -     Testosterone,Free and Total -     CBC with Differential/Platelet -     CMP14+EGFR  Screening for prostate cancer -     PSA, total and free  Mixed hyperlipidemia -     Lipid panel  Other orders -     escitalopram (LEXAPRO) 10 MG tablet; Take 1 tablet (10 mg total) by mouth daily. -     sildenafil (VIAGRA) 100 MG tablet;  Take 0.5-1 tablets (50-100 mg total) by mouth daily as needed for erectile dysfunction (sex).       I am having Erich Ricke. Washabaugh "Denard" start on escitalopram and sildenafil. I am also having him maintain his folic acid, testosterone cypionate, albuterol, rosuvastatin, tamsulosin, Epoetin Alfa-epbx (RETACRIT IJ), and methotrexate. We will continue to administer testosterone cypionate and testosterone cypionate.  Allergies as of 03/25/2022   No Known Allergies      Medication List        Accurate as of March 25, 2022  4:51 PM. If you have any questions, ask your nurse or doctor.          albuterol 108 (90 Base) MCG/ACT inhaler Commonly known as: VENTOLIN HFA Inhale 2 puffs into the lungs every 6 (six) hours as needed for wheezing or shortness of breath.   escitalopram 10 MG tablet Commonly known as: LEXAPRO Take 1 tablet (10 mg total) by mouth daily. Started by: Claretta Fraise, MD   folic acid 1 MG tablet Commonly known as: FOLVITE TAKE 1 TABLET BY MOUTH EVERY DAY   methotrexate 2.5 MG tablet Commonly known as: RHEUMATREX Take 6 tablets (15 mg total) by mouth once a week. Caution:Chemotherapy. Protect from light.   RETACRIT IJ Inject as directed. Every 2 weeks   rosuvastatin 10 MG tablet Commonly known as: CRESTOR Take 1 tablet (10 mg total) by mouth daily. (NEEDS TO BE SEEN BEFORE NEXT REFILL)   sildenafil 100 MG tablet Commonly known as: Viagra Take 0.5-1 tablets (50-100 mg total) by mouth daily as needed for erectile dysfunction (sex). Started by: Claretta Fraise, MD   tamsulosin 0.4 MG Caps capsule Commonly known as: FLOMAX   testosterone cypionate 200 MG/ML injection Commonly known as: DEPOTESTOSTERONE CYPIONATE INJECT 1 ML (200 MG TOTAL) INTO THE MUSCLE EVERY 14 DAYS       Continues to have specialty care for his renal status, retention and the sarcoidosis.   Follow-up: Return in about 6 weeks (around 05/06/2022).  Claretta Fraise, M.D.

## 2022-03-28 ENCOUNTER — Encounter: Payer: Self-pay | Admitting: Family Medicine

## 2022-03-28 DIAGNOSIS — D869 Sarcoidosis, unspecified: Secondary | ICD-10-CM

## 2022-03-28 DIAGNOSIS — N1832 Chronic kidney disease, stage 3b: Secondary | ICD-10-CM

## 2022-03-28 DIAGNOSIS — E291 Testicular hypofunction: Secondary | ICD-10-CM

## 2022-04-02 ENCOUNTER — Other Ambulatory Visit: Payer: 59

## 2022-04-02 DIAGNOSIS — N1831 Chronic kidney disease, stage 3a: Secondary | ICD-10-CM

## 2022-04-02 DIAGNOSIS — N1832 Chronic kidney disease, stage 3b: Secondary | ICD-10-CM

## 2022-04-02 DIAGNOSIS — E291 Testicular hypofunction: Secondary | ICD-10-CM

## 2022-04-02 DIAGNOSIS — D869 Sarcoidosis, unspecified: Secondary | ICD-10-CM

## 2022-04-02 LAB — BAYER DCA HB A1C WAIVED: HB A1C (BAYER DCA - WAIVED): 5 % (ref 4.8–5.6)

## 2022-04-03 ENCOUNTER — Other Ambulatory Visit (HOSPITAL_COMMUNITY): Payer: Self-pay | Admitting: *Deleted

## 2022-04-03 ENCOUNTER — Encounter: Payer: Self-pay | Admitting: Family Medicine

## 2022-04-03 ENCOUNTER — Ambulatory Visit (INDEPENDENT_AMBULATORY_CARE_PROVIDER_SITE_OTHER): Payer: 59

## 2022-04-03 DIAGNOSIS — E291 Testicular hypofunction: Secondary | ICD-10-CM | POA: Diagnosis not present

## 2022-04-03 LAB — LIPID PANEL
Chol/HDL Ratio: 2.6 ratio (ref 0.0–5.0)
Cholesterol, Total: 144 mg/dL (ref 100–199)
HDL: 56 mg/dL (ref >39)
LDL Chol Calc (NIH): 76 mg/dL (ref 0–99)
Triglycerides: 58 mg/dL (ref 0–149)
VLDL Cholesterol Cal: 12 mg/dL (ref 5–40)

## 2022-04-03 LAB — CMP14+EGFR
ALT: 16 IU/L (ref 0–44)
AST: 27 IU/L (ref 0–40)
Albumin/Globulin Ratio: 1.6 (ref 1.2–2.2)
Albumin: 3.9 g/dL (ref 3.8–4.9)
Alkaline Phosphatase: 130 IU/L — ABNORMAL HIGH (ref 44–121)
BUN/Creatinine Ratio: 7 — ABNORMAL LOW (ref 9–20)
BUN: 10 mg/dL (ref 6–24)
Bilirubin Total: 0.4 mg/dL (ref 0.0–1.2)
CO2: 25 mmol/L (ref 20–29)
Calcium: 9 mg/dL (ref 8.7–10.2)
Chloride: 101 mmol/L (ref 96–106)
Creatinine, Ser: 1.46 mg/dL — ABNORMAL HIGH (ref 0.76–1.27)
Globulin, Total: 2.4 g/dL (ref 1.5–4.5)
Glucose: 84 mg/dL (ref 70–99)
Potassium: 4.1 mmol/L (ref 3.5–5.2)
Sodium: 140 mmol/L (ref 134–144)
Total Protein: 6.3 g/dL (ref 6.0–8.5)
eGFR: 56 mL/min/{1.73_m2} — ABNORMAL LOW (ref >59)

## 2022-04-03 LAB — TESTOSTERONE,FREE AND TOTAL
Testosterone, Free: 6.1 pg/mL — ABNORMAL LOW (ref 7.2–24.0)
Testosterone: 673 ng/dL (ref 264–916)

## 2022-04-03 LAB — CBC WITH DIFFERENTIAL/PLATELET
Basophils Absolute: 0 10*3/uL (ref 0.0–0.2)
Basos: 1 %
EOS (ABSOLUTE): 0.5 10*3/uL — ABNORMAL HIGH (ref 0.0–0.4)
Eos: 12 %
Hematocrit: 37.1 % — ABNORMAL LOW (ref 37.5–51.0)
Hemoglobin: 12 g/dL — ABNORMAL LOW (ref 13.0–17.7)
Immature Grans (Abs): 0 10*3/uL (ref 0.0–0.1)
Immature Granulocytes: 0 %
Lymphocytes Absolute: 1 10*3/uL (ref 0.7–3.1)
Lymphs: 27 %
MCH: 26.8 pg (ref 26.6–33.0)
MCHC: 32.3 g/dL (ref 31.5–35.7)
MCV: 83 fL (ref 79–97)
Monocytes Absolute: 0.6 10*3/uL (ref 0.1–0.9)
Monocytes: 15 %
Neutrophils Absolute: 1.7 10*3/uL (ref 1.4–7.0)
Neutrophils: 45 %
Platelets: 265 10*3/uL (ref 150–450)
RBC: 4.47 x10E6/uL (ref 4.14–5.80)
RDW: 13.3 % (ref 11.6–15.4)
WBC: 3.8 10*3/uL (ref 3.4–10.8)

## 2022-04-03 LAB — PSA, TOTAL AND FREE
PSA, Free Pct: 13.3 %
PSA, Free: 0.08 ng/mL
Prostate Specific Ag, Serum: 0.6 ng/mL (ref 0.0–4.0)

## 2022-04-03 NOTE — Progress Notes (Signed)
Testosterone injection given to right upper outer quadrant.  Patient tolerated well. 

## 2022-04-03 NOTE — Progress Notes (Signed)
Hello Luke Davila,  Your lab result is normal and/or stable.Some minor variations that are not significant are commonly marked abnormal, but do not represent any medical problem for you.  Best regards, Seylah Wernert, M.D.

## 2022-04-07 ENCOUNTER — Encounter (HOSPITAL_COMMUNITY)
Admission: RE | Admit: 2022-04-07 | Discharge: 2022-04-07 | Disposition: A | Discharge status: Home / Self Care | Payer: 59 | Source: Ambulatory Visit | Attending: Internal Medicine | Admitting: Internal Medicine

## 2022-04-07 ENCOUNTER — Encounter (HOSPITAL_COMMUNITY): Payer: 59

## 2022-04-07 ENCOUNTER — Other Ambulatory Visit: Payer: Self-pay | Admitting: Pulmonary Disease

## 2022-04-07 DIAGNOSIS — Z862 Personal history of diseases of the blood and blood-forming organs and certain disorders involving the immune mechanism: Secondary | ICD-10-CM | POA: Insufficient documentation

## 2022-04-07 DIAGNOSIS — N189 Chronic kidney disease, unspecified: Secondary | ICD-10-CM | POA: Diagnosis not present

## 2022-04-07 MED ORDER — SODIUM CHLORIDE 0.9 % IV SOLN
510.0000 mg | INTRAVENOUS | Status: DC
  Administered 2022-04-07: 510 mg via INTRAVENOUS
  Filled 2022-04-07: qty 510

## 2022-04-14 ENCOUNTER — Other Ambulatory Visit: Payer: 59

## 2022-04-14 DIAGNOSIS — N1831 Chronic kidney disease, stage 3a: Secondary | ICD-10-CM

## 2022-04-14 DIAGNOSIS — D869 Sarcoidosis, unspecified: Secondary | ICD-10-CM

## 2022-04-14 LAB — BMP8+EGFR
BUN/Creatinine Ratio: 12 (ref 9–20)
BUN: 14 mg/dL (ref 6–24)
CO2: 24 mmol/L (ref 20–29)
Calcium: 9 mg/dL (ref 8.7–10.2)
Chloride: 100 mmol/L (ref 96–106)
Creatinine, Ser: 1.14 mg/dL (ref 0.76–1.27)
Glucose: 74 mg/dL (ref 70–99)
Potassium: 3.9 mmol/L (ref 3.5–5.2)
Sodium: 139 mmol/L (ref 134–144)
eGFR: 75 mL/min/{1.73_m2} (ref >59)

## 2022-04-15 ENCOUNTER — Ambulatory Visit (INDEPENDENT_AMBULATORY_CARE_PROVIDER_SITE_OTHER): Payer: 59

## 2022-04-15 DIAGNOSIS — E291 Testicular hypofunction: Secondary | ICD-10-CM

## 2022-04-15 NOTE — Progress Notes (Signed)
Hello Zay,  Your lab result is normal and/or stable.Some minor variations that are not significant are commonly marked abnormal, but do not represent any medical problem for you.  Best regards, Velencia Lenart, M.D.

## 2022-04-15 NOTE — Progress Notes (Signed)
Testosterone injection given to left upper outer quadrant.  Patient tolerated well. 

## 2022-04-21 ENCOUNTER — Encounter (HOSPITAL_COMMUNITY): Payer: 59

## 2022-04-22 ENCOUNTER — Encounter (HOSPITAL_COMMUNITY): Payer: 59

## 2022-04-29 ENCOUNTER — Ambulatory Visit (INDEPENDENT_AMBULATORY_CARE_PROVIDER_SITE_OTHER): Payer: 59

## 2022-04-29 ENCOUNTER — Other Ambulatory Visit: Payer: 59

## 2022-04-29 DIAGNOSIS — N1831 Chronic kidney disease, stage 3a: Secondary | ICD-10-CM

## 2022-04-29 DIAGNOSIS — E291 Testicular hypofunction: Secondary | ICD-10-CM

## 2022-04-29 DIAGNOSIS — D869 Sarcoidosis, unspecified: Secondary | ICD-10-CM

## 2022-04-29 NOTE — Progress Notes (Signed)
Testosterone injection given to right upper outer quadrant.  Patient tolerated well. 

## 2022-04-30 LAB — BMP8+EGFR
BUN/Creatinine Ratio: 7 — ABNORMAL LOW (ref 9–20)
BUN: 10 mg/dL (ref 6–24)
CO2: 23 mmol/L (ref 20–29)
Calcium: 9 mg/dL (ref 8.7–10.2)
Chloride: 98 mmol/L (ref 96–106)
Creatinine, Ser: 1.41 mg/dL — ABNORMAL HIGH (ref 0.76–1.27)
Glucose: 81 mg/dL (ref 70–99)
Potassium: 4.2 mmol/L (ref 3.5–5.2)
Sodium: 138 mmol/L (ref 134–144)
eGFR: 58 mL/min/{1.73_m2} — ABNORMAL LOW (ref >59)

## 2022-05-05 ENCOUNTER — Ambulatory Visit: Payer: 59 | Admitting: Family Medicine

## 2022-05-13 ENCOUNTER — Ambulatory Visit (INDEPENDENT_AMBULATORY_CARE_PROVIDER_SITE_OTHER): Payer: 59

## 2022-05-13 ENCOUNTER — Other Ambulatory Visit: Payer: 59

## 2022-05-13 DIAGNOSIS — E291 Testicular hypofunction: Secondary | ICD-10-CM | POA: Diagnosis not present

## 2022-05-13 DIAGNOSIS — D869 Sarcoidosis, unspecified: Secondary | ICD-10-CM

## 2022-05-13 DIAGNOSIS — N1831 Chronic kidney disease, stage 3a: Secondary | ICD-10-CM

## 2022-05-13 NOTE — Progress Notes (Signed)
Testosterone injection given to left upper outer quadrant.  Patient tolerated well. 

## 2022-05-14 LAB — BMP8+EGFR
BUN/Creatinine Ratio: 6 — ABNORMAL LOW (ref 9–20)
BUN: 8 mg/dL (ref 6–24)
CO2: 23 mmol/L (ref 20–29)
Calcium: 8.9 mg/dL (ref 8.7–10.2)
Chloride: 95 mmol/L — ABNORMAL LOW (ref 96–106)
Creatinine, Ser: 1.33 mg/dL — ABNORMAL HIGH (ref 0.76–1.27)
Glucose: 81 mg/dL (ref 70–99)
Potassium: 4 mmol/L (ref 3.5–5.2)
Sodium: 132 mmol/L — ABNORMAL LOW (ref 134–144)
eGFR: 63 mL/min/{1.73_m2} (ref >59)

## 2022-05-18 NOTE — Progress Notes (Signed)
Hello Luke Davila,  Your lab result is normal and/or stable.Some minor variations that are not significant are commonly marked abnormal, but do not represent any medical problem for you.  Best regards, Costa Jha, M.D.

## 2022-05-19 ENCOUNTER — Telehealth: Payer: Self-pay | Admitting: Family Medicine

## 2022-05-19 DIAGNOSIS — Z0289 Encounter for other administrative examinations: Secondary | ICD-10-CM

## 2022-05-19 NOTE — Telephone Encounter (Signed)
PATIENT dropped off FMLA forms to be completed and signed.  Form Fee Paid? (Y/N)  YES          If NO, form is placed on front office manager desk to hold until payment received. If YES, then form will be placed in the RX/HH Nurse Coordinators box for completion.  Form will not be processed until payment is received

## 2022-05-20 ENCOUNTER — Encounter (HOSPITAL_COMMUNITY): Payer: 59

## 2022-05-21 ENCOUNTER — Other Ambulatory Visit: Payer: Self-pay | Admitting: Family Medicine

## 2022-05-21 NOTE — Telephone Encounter (Signed)
Information completed and forwarded to PCP 

## 2022-05-22 NOTE — Telephone Encounter (Signed)
PCP completed and signed FMLA forms. They have been faxed to [employer] at fax number [phone]. LMOVM to let pt know they are complete and copy is at front desk ready for pick up.

## 2022-05-26 ENCOUNTER — Other Ambulatory Visit: Payer: 59

## 2022-05-26 ENCOUNTER — Ambulatory Visit (INDEPENDENT_AMBULATORY_CARE_PROVIDER_SITE_OTHER): Payer: 59

## 2022-05-26 DIAGNOSIS — E291 Testicular hypofunction: Secondary | ICD-10-CM

## 2022-05-26 DIAGNOSIS — N1831 Chronic kidney disease, stage 3a: Secondary | ICD-10-CM

## 2022-05-26 DIAGNOSIS — D869 Sarcoidosis, unspecified: Secondary | ICD-10-CM

## 2022-05-26 NOTE — Progress Notes (Signed)
Testosterone injection given to left upper outer quadrant.  Patient tolerated well. 

## 2022-05-27 LAB — BMP8+EGFR
BUN/Creatinine Ratio: 10 (ref 9–20)
BUN: 13 mg/dL (ref 6–24)
CO2: 26 mmol/L (ref 20–29)
Calcium: 9 mg/dL (ref 8.7–10.2)
Chloride: 97 mmol/L (ref 96–106)
Creatinine, Ser: 1.33 mg/dL — ABNORMAL HIGH (ref 0.76–1.27)
Glucose: 79 mg/dL (ref 70–99)
Potassium: 4 mmol/L (ref 3.5–5.2)
Sodium: 134 mmol/L (ref 134–144)
eGFR: 63 mL/min/{1.73_m2} (ref >59)

## 2022-06-08 ENCOUNTER — Other Ambulatory Visit: Payer: Self-pay | Admitting: Family Medicine

## 2022-06-08 DIAGNOSIS — E291 Testicular hypofunction: Secondary | ICD-10-CM

## 2022-06-09 ENCOUNTER — Ambulatory Visit: Payer: 59 | Admitting: Family Medicine

## 2022-06-09 ENCOUNTER — Ambulatory Visit (INDEPENDENT_AMBULATORY_CARE_PROVIDER_SITE_OTHER): Payer: 59

## 2022-06-09 ENCOUNTER — Encounter: Payer: Self-pay | Admitting: Family Medicine

## 2022-06-09 ENCOUNTER — Telehealth: Payer: Self-pay | Admitting: Family Medicine

## 2022-06-09 VITALS — BP 122/66 | HR 88 | Temp 97.5°F | Ht 75.0 in | Wt 225.8 lb

## 2022-06-09 DIAGNOSIS — J8410 Pulmonary fibrosis, unspecified: Secondary | ICD-10-CM | POA: Diagnosis not present

## 2022-06-09 DIAGNOSIS — M25551 Pain in right hip: Secondary | ICD-10-CM | POA: Diagnosis not present

## 2022-06-09 DIAGNOSIS — D869 Sarcoidosis, unspecified: Secondary | ICD-10-CM | POA: Diagnosis not present

## 2022-06-09 DIAGNOSIS — E291 Testicular hypofunction: Secondary | ICD-10-CM | POA: Diagnosis not present

## 2022-06-09 NOTE — Telephone Encounter (Signed)
LMOVM refill sent in via electronic request

## 2022-06-09 NOTE — Telephone Encounter (Signed)
  Prescription Request  06/09/2022  Is this a "Controlled Substance" medicine?   Have you seen your PCP in the last 2 weeks? Appt 6/3 at 2:10  If YES, route message to pool  -  If NO, patient needs to be scheduled for appointment.  What is the name of the medication or equipment? testosterone cypionate (DEPOTESTOSTERONE CYPIONATE) 200 MG/ML injection   Have you contacted your pharmacy to request a refill? yes   Which pharmacy would you like this sent to? [facility]   Patient notified that their request is being sent to the clinical staff for review and that they should receive a response within 2 business days.

## 2022-06-09 NOTE — Progress Notes (Signed)
Subjective:  Patient ID: Luke Davila, male    DOB: 06/13/1965  Age: 57 y.o. MRN: [id number]  CC: Medical Management of Chronic Issues   HPI NYLE OLESH presents for testosterone deficiency No sx of blood clotting. In today for level. Using injections for replacement. ENErgy okay, but also has sarcoid and pulmonary fibrosis and due to see his pulmonologist soon. Has a chronic cough. Not dyspneic.     06/09/2022    2:27 PM 03/25/2022    4:08 PM 03/25/2022    4:00 PM  Depression screen PHQ 2/9  Decreased Interest 0 1 0  Down, Depressed, Hopeless 0 0 0  PHQ - 2 Score 0 1 0  Altered sleeping  3   Tired, decreased energy  3   Change in appetite  0   Feeling bad or failure about yourself   0   Trouble concentrating  0   Moving slowly or fidgety/restless  3   Suicidal thoughts  0   PHQ-9 Score  10   Difficult doing work/chores  Extremely dIfficult     History Alhassane has a past medical history of Anemia of chronic renal failure, unspecified CKD stage, Arthritis, Avascular necrosis of hip (HCC), Enlarged prostate, Glaucoma, Gout, History of gout, and Pneumonia.   He has a past surgical history that includes Foot surgery (Left, 01/07/1983); Left hand surgery; Total hip arthroplasty (Left, 04/09/2015); Colonoscopy with propofol (N/A, 11/27/2015); Wisdom tooth extraction; Transurethral resection of prostate (N/A, 03/27/2021); Endobronchial ultrasound (Bilateral, 07/17/2021); Fine needle aspiration (07/17/2021); and Video bronchoscopy (07/17/2021).   His family history includes Hypertension in his mother.He reports that he has never smoked. He has never used smokeless tobacco. He reports current alcohol use. He reports that he does not use drugs.    ROS Review of Systems  Constitutional:  Negative for fever.  Respiratory:  Positive for cough. Negative for shortness of breath.   Cardiovascular:  Negative for chest pain.  Musculoskeletal:  Positive for arthralgias (right hip posteriorly.  ( Has hx of left hip avascular necrosis & replacement)).  Skin:  Negative for rash.    Objective:  BP 122/66   Pulse 88   Temp (!) 97.5 F (36.4 C)   Ht 6\' 3"  (1.905 m)   Wt 225 lb 12.8 oz (102.4 kg)   SpO2 98%   BMI 28.22 kg/m   BP Readings from Last 3 Encounters:  06/09/22 122/66  04/07/22 (!) 155/87  03/25/22 96/62    Wt Readings from Last 3 Encounters:  06/09/22 225 lb 12.8 oz (102.4 kg)  04/07/22 230 lb 6.4 oz (104.5 kg)  03/25/22 230 lb 6.4 oz (104.5 kg)     Physical Exam Vitals reviewed.  Constitutional:      Appearance: He is well-developed.  HENT:     Head: Normocephalic and atraumatic.     Right Ear: External ear normal.     Left Ear: External ear normal.     Mouth/Throat:     Pharynx: No oropharyngeal exudate or posterior oropharyngeal erythema.  Eyes:     Pupils: Pupils are equal, round, and reactive to light.  Cardiovascular:     Rate and Rhythm: Normal rate and regular rhythm.     Heart sounds: No murmur heard. Pulmonary:     Effort: No respiratory distress.     Breath sounds: Normal breath sounds.  Musculoskeletal:        General: Tenderness (mild at PSIS) present.     Cervical back: Normal range of motion  and neck supple.  Neurological:     Mental Status: He is alert and oriented to person, place, and time.       Assessment & Plan:   Jager was seen today for medical management of chronic issues.  Diagnoses and all orders for this visit:  Hypogonadism in male -     Bayer DCA Hb A1c Waived -     CMP14+EGFR -     Testosterone,Free and Total  Sarcoidosis -     Bayer DCA Hb A1c Waived -     BMP8+EGFR -     BMP8+EGFR -     BMP8+EGFR -     CMP14+EGFR -     Sedimentation rate  Right hip pain -     DG HIP UNILAT W OR W/O PELVIS 2-3 VIEWS RIGHT; Future  Pulmonary fibrosis (HCC)       I am having Ransome Benedix. Carroll "Denard" maintain his folic acid, albuterol, tamsulosin, Epoetin Alfa-epbx (RETACRIT IJ), escitalopram, sildenafil,  rosuvastatin, and testosterone cypionate. We administered testosterone cypionate. We will continue to administer testosterone cypionate and testosterone cypionate.  Allergies as of 06/09/2022   No Known Allergies      Medication List        Accurate as of June 09, 2022  5:40 PM. If you have any questions, ask your nurse or doctor.          albuterol 108 (90 Base) MCG/ACT inhaler Commonly known as: VENTOLIN HFA Inhale 2 puffs into the lungs every 6 (six) hours as needed for wheezing or shortness of breath.   escitalopram 10 MG tablet Commonly known as: LEXAPRO Take 1 tablet (10 mg total) by mouth daily.   folic acid 1 MG tablet Commonly known as: FOLVITE TAKE 1 TABLET BY MOUTH EVERY DAY   RETACRIT IJ Inject as directed. Every 2 weeks   rosuvastatin 10 MG tablet Commonly known as: CRESTOR TAKE 1 TABLET BY MOUTH EVERY DAY   sildenafil 100 MG tablet Commonly known as: Viagra Take 0.5-1 tablets (50-100 mg total) by mouth daily as needed for erectile dysfunction (sex).   tamsulosin 0.4 MG Caps capsule Commonly known as: FLOMAX   testosterone cypionate 200 MG/ML injection Commonly known as: DEPOTESTOSTERONE CYPIONATE INJECT 1 ML (200 MG TOTAL) INTO THE MUSCLE EVERY 14 DAYS         Follow-up: Return in about 6 months (around 12/09/2022), or if symptoms worsen or fail to improve.  Mechele Claude, M.D.

## 2022-06-10 ENCOUNTER — Other Ambulatory Visit: Payer: Self-pay | Admitting: *Deleted

## 2022-06-10 ENCOUNTER — Other Ambulatory Visit: Payer: 59

## 2022-06-10 DIAGNOSIS — E291 Testicular hypofunction: Secondary | ICD-10-CM

## 2022-06-10 DIAGNOSIS — E782 Mixed hyperlipidemia: Secondary | ICD-10-CM

## 2022-06-10 DIAGNOSIS — N179 Acute kidney failure, unspecified: Secondary | ICD-10-CM

## 2022-06-10 LAB — BMP8+EGFR
BUN/Creatinine Ratio: 7 — ABNORMAL LOW (ref 9–20)
BUN: 9 mg/dL (ref 6–24)
CO2: 23 mmol/L (ref 20–29)
Calcium: 9.2 mg/dL (ref 8.7–10.2)
Chloride: 99 mmol/L (ref 96–106)
Creatinine, Ser: 1.31 mg/dL — ABNORMAL HIGH (ref 0.76–1.27)
Glucose: 79 mg/dL (ref 70–99)
Potassium: 3.9 mmol/L (ref 3.5–5.2)
Sodium: 136 mmol/L (ref 134–144)
eGFR: 64 mL/min/{1.73_m2} (ref >59)

## 2022-06-10 LAB — BAYER DCA HB A1C WAIVED: HB A1C (BAYER DCA - WAIVED): 5.9 % — ABNORMAL HIGH (ref 4.8–5.6)

## 2022-06-10 LAB — SEDIMENTATION RATE

## 2022-06-10 NOTE — Progress Notes (Signed)
Hello Nicki,  Your lab result is normal and/or stable.Some minor variations that are not significant are commonly marked abnormal, but do not represent any medical problem for you.  Best regards, Jadeyn Hargett, M.D.

## 2022-06-11 ENCOUNTER — Encounter: Payer: Self-pay | Admitting: Family Medicine

## 2022-06-11 ENCOUNTER — Ambulatory Visit: Payer: 59 | Admitting: Pulmonary Disease

## 2022-06-13 LAB — CMP14+EGFR
ALT: 12 IU/L (ref 0–44)
AST: 24 IU/L (ref 0–40)
Albumin/Globulin Ratio: 1.9 (ref 1.2–2.2)
Albumin: 4.1 g/dL (ref 3.8–4.9)
Alkaline Phosphatase: 106 IU/L (ref 44–121)
BUN/Creatinine Ratio: 6 — ABNORMAL LOW (ref 9–20)
BUN: 8 mg/dL (ref 6–24)
Bilirubin Total: 0.9 mg/dL (ref 0.0–1.2)
CO2: 25 mmol/L (ref 20–29)
Calcium: 8.6 mg/dL — ABNORMAL LOW (ref 8.7–10.2)
Chloride: 102 mmol/L (ref 96–106)
Creatinine, Ser: 1.33 mg/dL — ABNORMAL HIGH (ref 0.76–1.27)
Globulin, Total: 2.2 g/dL (ref 1.5–4.5)
Glucose: 80 mg/dL (ref 70–99)
Potassium: 3.9 mmol/L (ref 3.5–5.2)
Sodium: 137 mmol/L (ref 134–144)
Total Protein: 6.3 g/dL (ref 6.0–8.5)
eGFR: 63 mL/min/{1.73_m2} (ref >59)

## 2022-06-13 LAB — TESTOSTERONE,FREE AND TOTAL
Testosterone, Free: 13.5 pg/mL (ref 7.2–24.0)
Testosterone: 1036 ng/dL — ABNORMAL HIGH (ref 264–916)

## 2022-06-14 ENCOUNTER — Encounter: Payer: Self-pay | Admitting: Family Medicine

## 2022-06-16 ENCOUNTER — Encounter: Payer: Self-pay | Admitting: Family Medicine

## 2022-06-16 NOTE — Telephone Encounter (Signed)
I called and spoke to Mr. Molchan. He will need a right hip replacement. I recommended ortho referral. He will send me the name of who he wants to use. He would like to research that first.

## 2022-06-17 ENCOUNTER — Encounter: Payer: Self-pay | Admitting: Family Medicine

## 2022-06-17 ENCOUNTER — Other Ambulatory Visit: Payer: Self-pay | Admitting: Family Medicine

## 2022-06-17 DIAGNOSIS — M87051 Idiopathic aseptic necrosis of right femur: Secondary | ICD-10-CM

## 2022-06-25 ENCOUNTER — Ambulatory Visit (INDEPENDENT_AMBULATORY_CARE_PROVIDER_SITE_OTHER): Payer: 59 | Admitting: *Deleted

## 2022-06-25 ENCOUNTER — Other Ambulatory Visit: Payer: 59

## 2022-06-25 DIAGNOSIS — E291 Testicular hypofunction: Secondary | ICD-10-CM | POA: Diagnosis not present

## 2022-06-25 DIAGNOSIS — D869 Sarcoidosis, unspecified: Secondary | ICD-10-CM

## 2022-06-25 NOTE — Progress Notes (Signed)
Testosterone shot given intramuscular left upper outer quadrant. Patient tolerated well

## 2022-06-26 LAB — BMP8+EGFR
BUN/Creatinine Ratio: 15 (ref 9–20)
BUN: 17 mg/dL (ref 6–24)
CO2: 23 mmol/L (ref 20–29)
Calcium: 9 mg/dL (ref 8.7–10.2)
Chloride: 100 mmol/L (ref 96–106)
Creatinine, Ser: 1.15 mg/dL (ref 0.76–1.27)
Glucose: 72 mg/dL (ref 70–99)
Potassium: 3.9 mmol/L (ref 3.5–5.2)
Sodium: 136 mmol/L (ref 134–144)
eGFR: 75 mL/min/{1.73_m2} (ref >59)

## 2022-06-26 NOTE — Progress Notes (Signed)
Hello Elma,  Your lab result is normal and/or stable.Some minor variations that are not significant are commonly marked abnormal, but do not represent any medical problem for you.  Best regards, Clemmie Marxen, M.D.

## 2022-07-01 ENCOUNTER — Other Ambulatory Visit: Payer: Self-pay | Admitting: Family Medicine

## 2022-07-01 DIAGNOSIS — J8410 Pulmonary fibrosis, unspecified: Secondary | ICD-10-CM

## 2022-07-07 ENCOUNTER — Other Ambulatory Visit: Payer: 59

## 2022-07-07 ENCOUNTER — Ambulatory Visit: Payer: 59

## 2022-07-09 ENCOUNTER — Other Ambulatory Visit: Payer: 59

## 2022-07-09 ENCOUNTER — Ambulatory Visit (INDEPENDENT_AMBULATORY_CARE_PROVIDER_SITE_OTHER): Payer: 59

## 2022-07-09 DIAGNOSIS — E291 Testicular hypofunction: Secondary | ICD-10-CM | POA: Diagnosis not present

## 2022-07-09 DIAGNOSIS — D869 Sarcoidosis, unspecified: Secondary | ICD-10-CM

## 2022-07-09 NOTE — Progress Notes (Signed)
Testosterone injection given to left upper outer quadrant.  Patient tolerated well. 

## 2022-07-10 LAB — BMP8+EGFR
BUN/Creatinine Ratio: 11 (ref 9–20)
BUN: 13 mg/dL (ref 6–24)
CO2: 23 mmol/L (ref 20–29)
Calcium: 8.4 mg/dL — ABNORMAL LOW (ref 8.7–10.2)
Chloride: 97 mmol/L (ref 96–106)
Creatinine, Ser: 1.16 mg/dL (ref 0.76–1.27)
Glucose: 80 mg/dL (ref 70–99)
Potassium: 3.5 mmol/L (ref 3.5–5.2)
Sodium: 133 mmol/L — ABNORMAL LOW (ref 134–144)
eGFR: 74 mL/min/{1.73_m2} (ref >59)

## 2022-07-14 NOTE — Progress Notes (Signed)
Hello Luke Davila,  Your lab result is normal and/or stable.Some minor variations that are not significant are commonly marked abnormal, but do not represent any medical problem for you.  Best regards, Tawanna Funk, M.D.

## 2022-07-15 ENCOUNTER — Ambulatory Visit: Payer: 59 | Admitting: Pulmonary Disease

## 2022-07-15 ENCOUNTER — Encounter: Payer: Self-pay | Admitting: Pulmonary Disease

## 2022-07-15 VITALS — BP 108/74 | HR 67 | Temp 97.9°F | Ht 75.0 in | Wt 217.2 lb

## 2022-07-15 DIAGNOSIS — D860 Sarcoidosis of lung: Secondary | ICD-10-CM

## 2022-07-15 DIAGNOSIS — Z5181 Encounter for therapeutic drug level monitoring: Secondary | ICD-10-CM | POA: Diagnosis not present

## 2022-07-15 MED ORDER — METHOTREXATE SODIUM 10 MG PO TABS: 20.0000 mg | ORAL_TABLET | ORAL | 5 refills | Status: DC

## 2022-07-15 NOTE — Addendum Note (Signed)
Addended by: Jacquiline Doe on: 07/15/2022 03:48 PM   Modules accepted: Orders

## 2022-07-15 NOTE — Addendum Note (Signed)
Addended by: Jacquiline Doe on: 07/15/2022 03:49 PM   Modules accepted: Orders

## 2022-07-15 NOTE — Patient Instructions (Signed)
I am glad you are doing well with your breathing Will check comprehensive metabolic panel and CBC today Increase methotrexate to 20 mg/week.  Will send in a new prescription for this Follow-up in 6 months

## 2022-07-15 NOTE — Progress Notes (Signed)
Luke Davila    [id number]    Jun 02, 1965  Primary Care Physician:Stacks, Broadus John, MD  Referring Physician: Mechele Claude, MD [address] [place],  [place] [address]  Chief complaint:  Follow-up for Sarcoidosis Started methotrexate in Aug 2023 Prednisone tapered off October 2023 by October  HPI: 57 y.o. with history of interstitial lung disease, CKD, HTN.  Originally seen as a consult in June 2023 with abnormal CT showing upper lung predominant changes.  This was initially noted in 2021 and was told he had sarcoidosis based on the pattern of interstitial lung disease.  He never had a biopsy done or any specific treatment given.  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 Has increasing dyspnea on exertion with flareups 2-3 times a year treated with prednisone tapers.  He was started on breztri in April 2023  He is here with his ex-wife who helps him with his healthcare needs and appointments  Underwent bronchoscopy with endobronchial ultrasound of lymph nodes on 07/17/21 with findings of granulomatous inflammation consistent with sarcoid.  Cultures are negative.  There is no evidence of malignancy He has been started on prednisone at 40 mg a day at the end of July 2023 and was tapered off by October 2023  Pets: No pets Occupation: Works as a Network engineer Exposures: No mold, hot tub, Financial controller.  No feather pillows or comforter Smoking history: Never smoker Travel history: No significant travel his Relevant family history: No family history of lung disease  Interim history: Continues on methotrexate at 15 mg/week. States that breathing is stable except for occasional cough.  Outpatient Encounter Medications as of 07/15/2022  Medication Sig   albuterol (VENTOLIN HFA) 108 (90 Base) MCG/ACT inhaler TAKE 2 PUFFS BY MOUTH EVERY 6 HOURS AS NEEDED FOR WHEEZE OR SHORTNESS OF BREATH   folic acid (FOLVITE) 1 MG tablet TAKE 1 TABLET BY MOUTH  EVERY DAY   methotrexate (RHEUMATREX) 15 MG tablet Take 15 mg by mouth once a week. Caution: Chemotherapy. Protect from light.   rosuvastatin (CRESTOR) 10 MG tablet TAKE 1 TABLET BY MOUTH EVERY DAY   sildenafil (VIAGRA) 100 MG tablet Take 0.5-1 tablets (50-100 mg total) by mouth daily as needed for erectile dysfunction (sex).   tadalafil (CIALIS) 5 MG tablet Take 5 mg by mouth daily.   tamsulosin (FLOMAX) 0.4 MG CAPS capsule    testosterone cypionate (DEPOTESTOSTERONE CYPIONATE) 200 MG/ML injection INJECT 1 ML (200 MG TOTAL) INTO THE MUSCLE EVERY 14 DAYS   [DISCONTINUED] Epoetin Alfa-epbx (RETACRIT IJ) Inject as directed. Every 2 weeks   [DISCONTINUED] escitalopram (LEXAPRO) 10 MG tablet Take 1 tablet (10 mg total) by mouth daily.   Facility-Administered Encounter Medications as of 07/15/2022  Medication   testosterone cypionate (DEPOTESTOSTERONE CYPIONATE) injection 200 mg   testosterone cypionate (DEPOTESTOSTERONE CYPIONATE) injection 200 mg    Physical Exam: Blood pressure 112/60, pulse 76, temperature 98 F (36.7 C), temperature source Oral, height 6\' 3"  (1.905 m), weight 231 lb 12.8 oz (105.1 kg), SpO2 94 %. Gen:      No acute distress HEENT:  EOMI, sclera anicteric Neck:     No masses; no thyromegaly Lungs:    Clear to auscultation bilaterally; normal respiratory effort CV:         Regular rate and rhythm; no murmurs Abd:      + bowel sounds; soft, non-tender; no palpable masses, no distension Ext:  No edema; adequate peripheral perfusion Skin:      Warm and dry; no rash Neuro: alert and oriented x 3 Psych: normal mood and affect   Data Reviewed: Imaging: CT high-resolution 06/13/2020-unchanged pattern of pulmonary fibrosis with upper lung predominance with peribronchovascular opacity, bronchiolectasis, enlargement Instylan hilar lymph nodes  High-resolution CT 06/28/2021-pulmonary fibrosis in similar pattern with slight worsening of fibrotic changes.  Hilar, mediastinal  lymphadenopathy  High resolution CT 02/19/2022-stable pattern of pulmonary fibrosis, changes of sarcoidosis I reviewed the images personally  PFTs: 06/17/2021 FVC 3.14 [69%], FEV1 2.40 [64%], F/F 76, TLC 4.36 [56%], DLCO 15.23 [47%] Moderate obstruction with bronchodilator response, severe restriction and diffusion impairment  01/20/2022 FVC 3.16 [57%], FEV1 2.42 [54%], TLC 4.64 [60%], DLCO 13.09 [41%] Moderate obstruction, severe restriction and diffusion impairment  Labs: CTD serologies 06/10/2021-negative Angiotensin-converting enzyme 06/10/2021- 47  Bronchoscopy with endobronchial ultrasound biopsy 07/17/2021 Station 7 lymph node biopsy with granulomatous inflammation.  Lymph node cultures negative to date  Assessment:  Sarcoidosis CT findings are suggestive of sarcoidosis and lymph node biopsy by endobronchial ultrasound confirms granulomatous inflammation. Has finished a prednisone course and is stable on methotrexate.  Continue folic acid He will require long-term therapy given severity of disease, fibrotic changes and also presence of ocular sarcoid.   Check CBC and comprehensive metabolic panel today Will increase methotrexate to 20 mg/week  Obstructive airway disease Unlikely to be COPD as he is a non-smoker.  He could have asthma versus airway involvement of sarcoid He is doing well on breztri and will continue the same  Plan/Recommendations: Continue breztri inhaler Methotrexate.  Increase dose to 20 mg/week Folic acid Labs for monitoring  Chilton Greathouse MD [facility] 07/15/2022, 3:32 PM  CC: Mechele Claude, MD

## 2022-07-16 LAB — COMPREHENSIVE METABOLIC PANEL
ALT: 13 U/L (ref 0–53)
AST: 27 U/L (ref 0–37)
Albumin: 4.3 g/dL (ref 3.5–5.2)
Alkaline Phosphatase: 101 U/L (ref 39–117)
BUN: 13 mg/dL (ref 6–23)
CO2: 28 mEq/L (ref 19–32)
Calcium: 9.7 mg/dL (ref 8.4–10.5)
Chloride: 95 mEq/L — ABNORMAL LOW (ref 96–112)
Creatinine, Ser: 1.21 mg/dL (ref 0.40–1.50)
GFR: 66.77 mL/min (ref >60.00)
Glucose, Bld: 76 mg/dL (ref 70–99)
Potassium: 3.9 mEq/L (ref 3.5–5.1)
Sodium: 132 mEq/L — ABNORMAL LOW (ref 135–145)
Total Bilirubin: 1.7 mg/dL — ABNORMAL HIGH (ref 0.2–1.2)
Total Protein: 7.2 g/dL (ref 6.0–8.3)

## 2022-07-16 LAB — CBC WITH DIFFERENTIAL/PLATELET
Basophils Absolute: 0.1 10*3/uL (ref 0.0–0.1)
Basophils Relative: 1.6 % (ref 0.0–3.0)
Eosinophils Absolute: 0.2 10*3/uL (ref 0.0–0.7)
Eosinophils Relative: 5.8 % — ABNORMAL HIGH (ref 0.0–5.0)
HCT: 35.3 % — ABNORMAL LOW (ref 39.0–52.0)
Hemoglobin: 11.5 g/dL — ABNORMAL LOW (ref 13.0–17.0)
Lymphocytes Relative: 25.3 % (ref 12.0–46.0)
Lymphs Abs: 1 10*3/uL (ref 0.7–4.0)
MCHC: 32.6 g/dL (ref 30.0–36.0)
MCV: 79.6 fl (ref 78.0–100.0)
Monocytes Absolute: 0.6 10*3/uL (ref 0.1–1.0)
Monocytes Relative: 14.7 % — ABNORMAL HIGH (ref 3.0–12.0)
Neutro Abs: 2.1 10*3/uL (ref 1.4–7.7)
Neutrophils Relative %: 52.6 % (ref 43.0–77.0)
Platelets: 240 10*3/uL (ref 150.0–400.0)
RBC: 4.44 Mil/uL (ref 4.22–5.81)
RDW: 15.7 % — ABNORMAL HIGH (ref 11.5–15.5)
WBC: 3.9 10*3/uL — ABNORMAL LOW (ref 4.0–10.5)

## 2022-07-20 ENCOUNTER — Encounter: Payer: Self-pay | Admitting: Pulmonary Disease

## 2022-07-24 ENCOUNTER — Ambulatory Visit (INDEPENDENT_AMBULATORY_CARE_PROVIDER_SITE_OTHER): Payer: 59

## 2022-07-24 ENCOUNTER — Other Ambulatory Visit: Payer: 59

## 2022-07-24 DIAGNOSIS — E291 Testicular hypofunction: Secondary | ICD-10-CM | POA: Diagnosis not present

## 2022-07-24 DIAGNOSIS — D869 Sarcoidosis, unspecified: Secondary | ICD-10-CM

## 2022-07-24 NOTE — Progress Notes (Signed)
Testerone injection given in rt upper outer gult patient tolerated well

## 2022-07-25 LAB — BMP8+EGFR
BUN/Creatinine Ratio: 13 (ref 9–20)
BUN: 17 mg/dL (ref 6–24)
CO2: 24 mmol/L (ref 20–29)
Calcium: 9.2 mg/dL (ref 8.7–10.2)
Chloride: 100 mmol/L (ref 96–106)
Creatinine, Ser: 1.31 mg/dL — ABNORMAL HIGH (ref 0.76–1.27)
Glucose: 99 mg/dL (ref 70–99)
Potassium: 4 mmol/L (ref 3.5–5.2)
Sodium: 138 mmol/L (ref 134–144)
eGFR: 64 mL/min/{1.73_m2} (ref >59)

## 2022-07-27 NOTE — Progress Notes (Signed)
Hello [name],  Your lab result is normal and/or stable.Some minor variations that are not significant are commonly marked abnormal, but do not represent any medical problem for you.  Best regards, Warren Stacks, M.D.

## 2022-07-31 ENCOUNTER — Other Ambulatory Visit: Payer: Self-pay | Admitting: Pulmonary Disease

## 2022-08-01 ENCOUNTER — Other Ambulatory Visit: Payer: Self-pay | Admitting: Physician Assistant

## 2022-08-04 ENCOUNTER — Other Ambulatory Visit: Payer: Self-pay | Admitting: Pulmonary Disease

## 2022-08-04 MED ORDER — METHOTREXATE SODIUM 2.5 MG PO TABS: 20.0000 mg | ORAL_TABLET | ORAL | 3 refills | Status: DC

## 2022-08-06 ENCOUNTER — Other Ambulatory Visit: Payer: Self-pay | Admitting: Orthopaedic Surgery

## 2022-08-06 ENCOUNTER — Other Ambulatory Visit: Payer: Self-pay | Admitting: Family Medicine

## 2022-08-06 ENCOUNTER — Ambulatory Visit (INDEPENDENT_AMBULATORY_CARE_PROVIDER_SITE_OTHER): Payer: 59

## 2022-08-06 ENCOUNTER — Other Ambulatory Visit: Payer: 59

## 2022-08-06 DIAGNOSIS — D869 Sarcoidosis, unspecified: Secondary | ICD-10-CM

## 2022-08-06 DIAGNOSIS — M5412 Radiculopathy, cervical region: Secondary | ICD-10-CM

## 2022-08-06 DIAGNOSIS — E291 Testicular hypofunction: Secondary | ICD-10-CM

## 2022-08-06 NOTE — Progress Notes (Signed)
Testosterone injection given to left upper outer quadrant.  Patient tolerated well. 

## 2022-08-07 LAB — BMP8+EGFR
BUN/Creatinine Ratio: 15 (ref 9–20)
BUN: 21 mg/dL (ref 6–24)
CO2: 26 mmol/L (ref 20–29)
Calcium: 8.9 mg/dL (ref 8.7–10.2)
Chloride: 101 mmol/L (ref 96–106)
Creatinine, Ser: 1.37 mg/dL — ABNORMAL HIGH (ref 0.76–1.27)
Glucose: 71 mg/dL (ref 70–99)
Potassium: 3.7 mmol/L (ref 3.5–5.2)
Sodium: 139 mmol/L (ref 134–144)
eGFR: 61 mL/min/{1.73_m2} (ref >59)

## 2022-08-13 ENCOUNTER — Other Ambulatory Visit: Payer: Self-pay | Admitting: Family Medicine

## 2022-08-13 ENCOUNTER — Encounter (HOSPITAL_COMMUNITY): Payer: Self-pay

## 2022-08-18 ENCOUNTER — Other Ambulatory Visit: Payer: 59

## 2022-08-18 ENCOUNTER — Ambulatory Visit (INDEPENDENT_AMBULATORY_CARE_PROVIDER_SITE_OTHER): Payer: 59 | Admitting: *Deleted

## 2022-08-18 DIAGNOSIS — D869 Sarcoidosis, unspecified: Secondary | ICD-10-CM

## 2022-08-18 DIAGNOSIS — E291 Testicular hypofunction: Secondary | ICD-10-CM | POA: Diagnosis not present

## 2022-08-18 NOTE — Progress Notes (Signed)
Pt in for testosterone injection. Given in right upper outer quad. Pt tolerated well.

## 2022-08-19 NOTE — Progress Notes (Signed)
Hello Luke Davila,  Your lab result is normal and/or stable.Some minor variations that are not significant are commonly marked abnormal, but do not represent any medical problem for you.  Best regards, Warren Stacks, M.D.

## 2022-08-20 ENCOUNTER — Ambulatory Visit
Admission: RE | Admit: 2022-08-20 | Discharge: 2022-08-20 | Disposition: A | Discharge status: Home / Self Care | Payer: 59 | Source: Ambulatory Visit | Attending: Orthopaedic Surgery | Admitting: Orthopaedic Surgery

## 2022-08-20 ENCOUNTER — Other Ambulatory Visit (INDEPENDENT_AMBULATORY_CARE_PROVIDER_SITE_OTHER): Payer: 59

## 2022-08-20 ENCOUNTER — Other Ambulatory Visit: Payer: Self-pay | Admitting: Pulmonary Disease

## 2022-08-20 DIAGNOSIS — Z5181 Encounter for therapeutic drug level monitoring: Secondary | ICD-10-CM

## 2022-08-20 DIAGNOSIS — M5412 Radiculopathy, cervical region: Secondary | ICD-10-CM

## 2022-08-20 LAB — COMPREHENSIVE METABOLIC PANEL
ALT: 12 U/L (ref 0–53)
AST: 21 U/L (ref 0–37)
Albumin: 4.2 g/dL (ref 3.5–5.2)
Alkaline Phosphatase: 98 U/L (ref 39–117)
BUN: 12 mg/dL (ref 6–23)
CO2: 29 mEq/L (ref 19–32)
Calcium: 9.1 mg/dL (ref 8.4–10.5)
Chloride: 101 mEq/L (ref 96–112)
Creatinine, Ser: 1.27 mg/dL (ref 0.40–1.50)
GFR: 62.96 mL/min (ref >60.00)
Glucose, Bld: 76 mg/dL (ref 70–99)
Potassium: 3.5 mEq/L (ref 3.5–5.1)
Sodium: 137 mEq/L (ref 135–145)
Total Bilirubin: 1.1 mg/dL (ref 0.2–1.2)
Total Protein: 7.2 g/dL (ref 6.0–8.3)

## 2022-08-20 LAB — CBC WITH DIFFERENTIAL/PLATELET
Basophils Absolute: 0 10*3/uL (ref 0.0–0.1)
Basophils Relative: 1.2 % (ref 0.0–3.0)
Eosinophils Absolute: 0.2 10*3/uL (ref 0.0–0.7)
Eosinophils Relative: 4.6 % (ref 0.0–5.0)
HCT: 33.9 % — ABNORMAL LOW (ref 39.0–52.0)
Hemoglobin: 10.9 g/dL — ABNORMAL LOW (ref 13.0–17.0)
Lymphocytes Relative: 22.9 % (ref 12.0–46.0)
Lymphs Abs: 0.9 10*3/uL (ref 0.7–4.0)
MCHC: 32.3 g/dL (ref 30.0–36.0)
MCV: 81.6 fl (ref 78.0–100.0)
Monocytes Absolute: 0.6 10*3/uL (ref 0.1–1.0)
Monocytes Relative: 15.3 % — ABNORMAL HIGH (ref 3.0–12.0)
Neutro Abs: 2.1 10*3/uL (ref 1.4–7.7)
Neutrophils Relative %: 56 % (ref 43.0–77.0)
Platelets: 265 10*3/uL (ref 150.0–400.0)
RBC: 4.15 Mil/uL — ABNORMAL LOW (ref 4.22–5.81)
RDW: 15.1 % (ref 11.5–15.5)
WBC: 3.8 10*3/uL — ABNORMAL LOW (ref 4.0–10.5)

## 2022-08-25 ENCOUNTER — Encounter: Payer: Self-pay | Admitting: Family Medicine

## 2022-08-26 ENCOUNTER — Encounter: Payer: Self-pay | Admitting: Family Medicine

## 2022-08-26 DIAGNOSIS — R29898 Other symptoms and signs involving the musculoskeletal system: Secondary | ICD-10-CM

## 2022-09-01 ENCOUNTER — Other Ambulatory Visit: Payer: 59

## 2022-09-01 ENCOUNTER — Ambulatory Visit: Payer: 59

## 2022-09-01 DIAGNOSIS — N179 Acute kidney failure, unspecified: Secondary | ICD-10-CM

## 2022-09-01 DIAGNOSIS — E291 Testicular hypofunction: Secondary | ICD-10-CM

## 2022-09-02 ENCOUNTER — Telehealth: Payer: Self-pay

## 2022-09-02 LAB — SPECIMEN STATUS REPORT

## 2022-09-02 LAB — HGB A1C W/O EAG: Hgb A1c MFr Bld: 5.2 % (ref 4.8–5.6)

## 2022-09-02 NOTE — Telephone Encounter (Signed)
Pharmacy Patient Advocate Encounter  Received notification from [facility] that Prior Authorization for Testosterone Cypionate 200MG /ML intramuscular solution has been APPROVED from 09/02/22 to 09/02/23   PA #/Case ID/Reference #: [id number]

## 2022-09-02 NOTE — Telephone Encounter (Signed)
Luke Davila (Key: [id number]) PA Case ID #: [id number] Rx #: [id number] Need Help? Call us at [phone] Status sent iconSent to Plan today Drug Testosterone Cypionate 200MG /ML intramuscular solution ePA cloud logo Form [facility] Electronic Prior Authorization Form (2017 NCPDP) Original Claim Info [address]ar ePA at [email or web address] Requires Prior Authorization

## 2022-09-03 ENCOUNTER — Ambulatory Visit: Payer: 59

## 2022-09-03 LAB — CMP14+EGFR
ALT: 12 IU/L (ref 0–44)
AST: 26 IU/L (ref 0–40)
Albumin: 4.1 g/dL (ref 3.8–4.9)
Alkaline Phosphatase: 139 IU/L — ABNORMAL HIGH (ref 44–121)
BUN/Creatinine Ratio: 10 (ref 9–20)
BUN: 13 mg/dL (ref 6–24)
Bilirubin Total: 1 mg/dL (ref 0.0–1.2)
CO2: 25 mmol/L (ref 20–29)
Calcium: 9 mg/dL (ref 8.7–10.2)
Chloride: 100 mmol/L (ref 96–106)
Creatinine, Ser: 1.26 mg/dL (ref 0.76–1.27)
Globulin, Total: 2.2 g/dL (ref 1.5–4.5)
Glucose: 67 mg/dL — ABNORMAL LOW (ref 70–99)
Potassium: 3.8 mmol/L (ref 3.5–5.2)
Sodium: 137 mmol/L (ref 134–144)
Total Protein: 6.3 g/dL (ref 6.0–8.5)
eGFR: 67 mL/min/{1.73_m2} (ref >59)

## 2022-09-03 LAB — CBC WITH DIFFERENTIAL/PLATELET
Basophils Absolute: 0 10*3/uL (ref 0.0–0.2)
Basos: 1 %
EOS (ABSOLUTE): 0.1 10*3/uL (ref 0.0–0.4)
Eos: 3 %
Hematocrit: 33.2 % — ABNORMAL LOW (ref 37.5–51.0)
Hemoglobin: 11 g/dL — ABNORMAL LOW (ref 13.0–17.7)
Immature Grans (Abs): 0 10*3/uL (ref 0.0–0.1)
Immature Granulocytes: 0 %
Lymphocytes Absolute: 0.8 10*3/uL (ref 0.7–3.1)
Lymphs: 20 %
MCH: 26.4 pg — ABNORMAL LOW (ref 26.6–33.0)
MCHC: 33.1 g/dL (ref 31.5–35.7)
MCV: 80 fL (ref 79–97)
Monocytes Absolute: 0.6 10*3/uL (ref 0.1–0.9)
Monocytes: 14 %
Neutrophils Absolute: 2.5 10*3/uL (ref 1.4–7.0)
Neutrophils: 62 %
Platelets: 271 10*3/uL (ref 150–450)
RBC: 4.16 x10E6/uL (ref 4.14–5.80)
RDW: 13.6 % (ref 11.6–15.4)
WBC: 4 10*3/uL (ref 3.4–10.8)

## 2022-09-03 LAB — TESTOSTERONE,FREE AND TOTAL
Testosterone, Free: 10.1 pg/mL (ref 7.2–24.0)
Testosterone: 875 ng/dL (ref 264–916)

## 2022-09-03 LAB — SEDIMENTATION RATE: Sed Rate: 23 mm/h (ref 0–30)

## 2022-09-16 ENCOUNTER — Telehealth: Payer: Self-pay | Admitting: Family Medicine

## 2022-09-16 ENCOUNTER — Other Ambulatory Visit: Payer: Self-pay | Admitting: Family Medicine

## 2022-09-16 DIAGNOSIS — E291 Testicular hypofunction: Secondary | ICD-10-CM

## 2022-09-16 MED ORDER — TESTOSTERONE CYPIONATE 200 MG/ML IM SOLN
200.0000 mg | INTRAMUSCULAR | 1 refills | Status: DC
Start: 2022-09-16 — End: 2023-04-13

## 2022-09-16 NOTE — Telephone Encounter (Signed)
Please let the patient know that I sent their prescription to their pharmacy. Thanks, WS 

## 2022-09-17 ENCOUNTER — Encounter: Payer: Self-pay | Admitting: Family Medicine

## 2022-09-17 ENCOUNTER — Ambulatory Visit: Payer: 59 | Admitting: Family Medicine

## 2022-09-17 VITALS — BP 121/65 | HR 74 | Temp 97.4°F | Ht 75.0 in | Wt 202.6 lb

## 2022-09-17 DIAGNOSIS — M353 Polymyalgia rheumatica: Secondary | ICD-10-CM | POA: Diagnosis not present

## 2022-09-17 MED ORDER — PREDNISONE 20 MG PO TABS: 20.0000 mg | ORAL_TABLET | Freq: Every day | ORAL | 2 refills | Status: DC

## 2022-09-17 NOTE — Progress Notes (Signed)
Subjective:  Patient ID: Luke Davila, male    DOB: 02-11-1965  Age: 57 y.o. MRN: [id number]  CC: Medical Management of Chronic Issues   HPI Luke Davila presents for legs feels weak. MRI by ortho was negative for hips. Feels weak all over. Has lost much of his strength in the legs. Can't walk well. Onset was remote. No pain. Some numbness, tinglingMainly loss of function. Prednisone gives some relief.   Pt. Wants to see neurology since Ortho said this was not Musculoskeletal. However, referral was closed since the work up was done by ortho and referrals could not get records from them. Neuro refused to see him without the notes. As a result he is here today to see if we can redo enough of the ortho work up to satisfy neuro that he needs their services.   He saw Dr. Jerl Santos at [facility] due to hip  X-ray report had shown possible avascular necrosis. Dr. Jerl Santos ruled this out. His records will be requested. They are not currently available.  Pt. Now has severe weakness.       09/17/2022    2:00 PM 09/17/2022    1:51 PM 06/09/2022    2:27 PM  Depression screen PHQ 2/9  Decreased Interest 1 0 0  Down, Depressed, Hopeless 1 0 0  PHQ - 2 Score 2 0 0  Altered sleeping 1    Tired, decreased energy 1    Change in appetite 0    Feeling bad or failure about yourself  0    Trouble concentrating 0    Moving slowly or fidgety/restless 3    Suicidal thoughts 0    PHQ-9 Score 7    Difficult doing work/chores Somewhat difficult      History Luke Davila has a past medical history of Anemia of chronic renal failure, unspecified CKD stage, Arthritis, Avascular necrosis of hip (HCC), Enlarged prostate, Glaucoma, Gout, History of gout, and Pneumonia.   He has a past surgical history that includes Foot surgery (Left, 01/07/1983); Left hand surgery; Total hip arthroplasty (Left, 04/09/2015); Colonoscopy with propofol (N/A, 11/27/2015); Wisdom tooth extraction; Transurethral resection of prostate  (N/A, 03/27/2021); Endobronchial ultrasound (Bilateral, 07/17/2021); Fine needle aspiration (07/17/2021); and Video bronchoscopy (07/17/2021).   His family history includes Hypertension in his mother.He reports that he has never smoked. He has never used smokeless tobacco. He reports current alcohol use. He reports that he does not use drugs.    ROS Review of Systems  Constitutional:  Negative for fever.  Respiratory:  Negative for shortness of breath.   Cardiovascular:  Negative for chest pain.  Musculoskeletal:  Negative for arthralgias.  Skin:  Negative for rash.  Neurological:  Positive for weakness (BLE).    Objective:  BP 121/65   Pulse 74   Temp (!) 97.4 F (36.3 C)   Ht 6\' 3"  (1.905 m)   Wt 202 lb 9.6 oz (91.9 kg)   SpO2 100%   BMI 25.32 kg/m   BP Readings from Last 3 Encounters:  09/17/22 121/65  07/15/22 108/74  06/09/22 122/66    Wt Readings from Last 3 Encounters:  09/17/22 202 lb 9.6 oz (91.9 kg)  07/15/22 217 lb 3.2 oz (98.5 kg)  06/09/22 225 lb 12.8 oz (102.4 kg)     Physical Exam Vitals reviewed.  Constitutional:      Appearance: He is well-developed.  HENT:     Head: Normocephalic and atraumatic.     Right Ear: External ear normal.  Left Ear: External ear normal.     Mouth/Throat:     Pharynx: No oropharyngeal exudate or posterior oropharyngeal erythema.  Eyes:     Pupils: Pupils are equal, round, and reactive to light.  Cardiovascular:     Rate and Rhythm: Normal rate and regular rhythm.     Heart sounds: No murmur heard. Pulmonary:     Effort: No respiratory distress.     Breath sounds: Normal breath sounds.  Musculoskeletal:     Cervical back: Normal range of motion and neck supple.  Skin:    General: Skin is warm and dry.  Neurological:     Mental Status: He is alert and oriented to person, place, and time.     Motor: Weakness present.     Coordination: Coordination abnormal.     Gait: Gait abnormal (small shuufling steps).        Assessment & Plan:   [name] was seen today for medical management of chronic issues.  Diagnoses and all orders for this visit:  Polymyalgia (HCC) -     CMP14+EGFR -     CBC with Differential/Platelet -     Sedimentation Rate -     CK  Other orders -     predniSONE (DELTASONE) 20 MG tablet; Take 1 tablet (20 mg total) by mouth daily with breakfast.       I have discontinued Luke Davila. [name]'s tadalafil, folic acid, and rosuvastatin. I am also having him start on predniSONE. Additionally, I am having him maintain his tamsulosin, sildenafil, albuterol, methotrexate, and testosterone cypionate. We will continue to administer testosterone cypionate and testosterone cypionate.  Allergies as of 09/17/2022   No Known Allergies      Medication List        Accurate as of September 17, 2022 11:59 PM. If you have any questions, ask your nurse or doctor.          STOP taking these medications    folic acid 1 MG tablet Commonly known as: FOLVITE Stopped by: Rital Cavey   rosuvastatin 10 MG tablet Commonly known as: CRESTOR Stopped by: Lithzy Bernard   tadalafil 5 MG tablet Commonly known as: CIALIS Stopped by: Lynell Kussman       TAKE these medications    albuterol 108 (90 Base) MCG/ACT inhaler Commonly known as: VENTOLIN HFA TAKE 2 PUFFS BY MOUTH EVERY 6 HOURS AS NEEDED FOR WHEEZE OR SHORTNESS OF BREATH   methotrexate 2.5 MG tablet Commonly known as: RHEUMATREX Take 8 tablets (20 mg total) by mouth once a week. Caution:Chemotherapy. Protect from light.   predniSONE 20 MG tablet Commonly known as: DELTASONE Take 1 tablet (20 mg total) by mouth daily with breakfast. Started by: Javaeh Muscatello   sildenafil 100 MG tablet Commonly known as: Viagra Take 0.5-1 tablets (50-100 mg total) by mouth daily as needed for erectile dysfunction (sex).   tamsulosin 0.4 MG Caps capsule Commonly known as: FLOMAX   testosterone cypionate 200  MG/ML injection Commonly known as: DEPOTESTOSTERONE CYPIONATE Inject 1 mL (200 mg total) into the muscle every 14 (fourteen) days. INJECT 1 ML (200 MG TOTAL) INTO THE MUSCLE EVERY 14 DAYS         Follow-up: Return in about 3 months (around 12/17/2022).  Mechele Claude, M.D.

## 2022-09-18 LAB — CBC WITH DIFFERENTIAL/PLATELET
Basophils Absolute: 0.1 10*3/uL (ref 0.0–0.2)
Basos: 1 %
EOS (ABSOLUTE): 0.3 10*3/uL (ref 0.0–0.4)
Eos: 7 %
Hematocrit: 33.2 % — ABNORMAL LOW (ref 37.5–51.0)
Hemoglobin: 10.5 g/dL — ABNORMAL LOW (ref 13.0–17.7)
Immature Grans (Abs): 0 10*3/uL (ref 0.0–0.1)
Immature Granulocytes: 0 %
Lymphocytes Absolute: 0.7 10*3/uL (ref 0.7–3.1)
Lymphs: 17 %
MCH: 25.9 pg — ABNORMAL LOW (ref 26.6–33.0)
MCHC: 31.6 g/dL (ref 31.5–35.7)
MCV: 82 fL (ref 79–97)
Monocytes Absolute: 0.7 10*3/uL (ref 0.1–0.9)
Monocytes: 16 %
Neutrophils Absolute: 2.4 10*3/uL (ref 1.4–7.0)
Neutrophils: 59 %
Platelets: 275 10*3/uL (ref 150–450)
RBC: 4.05 x10E6/uL — ABNORMAL LOW (ref 4.14–5.80)
RDW: 13.2 % (ref 11.6–15.4)
WBC: 4.1 10*3/uL (ref 3.4–10.8)

## 2022-09-18 LAB — CMP14+EGFR
ALT: 10 IU/L (ref 0–44)
AST: 27 IU/L (ref 0–40)
Albumin: 4.2 g/dL (ref 3.8–4.9)
Alkaline Phosphatase: 145 IU/L — ABNORMAL HIGH (ref 44–121)
BUN/Creatinine Ratio: 8 — ABNORMAL LOW (ref 9–20)
BUN: 8 mg/dL (ref 6–24)
Bilirubin Total: 0.7 mg/dL (ref 0.0–1.2)
CO2: 23 mmol/L (ref 20–29)
Calcium: 8.7 mg/dL (ref 8.7–10.2)
Chloride: 101 mmol/L (ref 96–106)
Creatinine, Ser: 0.98 mg/dL (ref 0.76–1.27)
Globulin, Total: 2.3 g/dL (ref 1.5–4.5)
Glucose: 75 mg/dL (ref 70–99)
Potassium: 4 mmol/L (ref 3.5–5.2)
Sodium: 139 mmol/L (ref 134–144)
Total Protein: 6.5 g/dL (ref 6.0–8.5)
eGFR: 90 mL/min/{1.73_m2} (ref >59)

## 2022-09-18 LAB — SEDIMENTATION RATE: Sed Rate: 31 mm/h — ABNORMAL HIGH (ref 0–30)

## 2022-09-18 LAB — CK: Total CK: 252 U/L (ref 41–331)

## 2022-09-19 ENCOUNTER — Encounter: Payer: Self-pay | Admitting: Family Medicine

## 2022-09-19 NOTE — Progress Notes (Signed)
Hello Freddi,  Your lab result is normal and/or stable.Some minor variations that are not significant are commonly marked abnormal, but do not represent any medical problem for you.  Best regards, Mechele Claude, M.D.

## 2022-09-29 ENCOUNTER — Other Ambulatory Visit: Payer: Self-pay | Admitting: Family Medicine

## 2022-09-29 DIAGNOSIS — J8410 Pulmonary fibrosis, unspecified: Secondary | ICD-10-CM

## 2022-09-30 ENCOUNTER — Encounter: Payer: Self-pay | Admitting: Family Medicine

## 2022-09-30 NOTE — Telephone Encounter (Signed)
Can you get records from her ortho to send to [facility] so they will reconsider her referral?

## 2022-10-01 ENCOUNTER — Ambulatory Visit (INDEPENDENT_AMBULATORY_CARE_PROVIDER_SITE_OTHER): Payer: 59

## 2022-10-01 ENCOUNTER — Other Ambulatory Visit: Payer: 59

## 2022-10-01 ENCOUNTER — Other Ambulatory Visit: Payer: Self-pay | Admitting: *Deleted

## 2022-10-01 DIAGNOSIS — E291 Testicular hypofunction: Secondary | ICD-10-CM

## 2022-10-01 DIAGNOSIS — N179 Acute kidney failure, unspecified: Secondary | ICD-10-CM

## 2022-10-01 DIAGNOSIS — D869 Sarcoidosis, unspecified: Secondary | ICD-10-CM

## 2022-10-01 NOTE — Progress Notes (Signed)
Testosterone injection given to left upper outer quadrant.  Patient tolerated well. 

## 2022-10-02 LAB — BASIC METABOLIC PANEL
BUN/Creatinine Ratio: 11 (ref 9–20)
BUN: 13 mg/dL (ref 6–24)
CO2: 23 mmol/L (ref 20–29)
Calcium: 8.7 mg/dL (ref 8.7–10.2)
Chloride: 98 mmol/L (ref 96–106)
Creatinine, Ser: 1.2 mg/dL (ref 0.76–1.27)
Glucose: 78 mg/dL (ref 70–99)
Potassium: 4.3 mmol/L (ref 3.5–5.2)
Sodium: 135 mmol/L (ref 134–144)
eGFR: 71 mL/min/{1.73_m2} (ref >59)

## 2022-10-06 ENCOUNTER — Encounter: Payer: Self-pay | Admitting: Neurology

## 2022-10-06 NOTE — Progress Notes (Signed)
Hello [name],  Your lab result is normal and/or stable.Some minor variations that are not significant are commonly marked abnormal, but do not represent any medical problem for you.  Best regards, Dontarious Schaum, M.D.

## 2022-10-08 ENCOUNTER — Ambulatory Visit: Payer: 59 | Admitting: Nurse Practitioner

## 2022-10-08 ENCOUNTER — Encounter: Payer: Self-pay | Admitting: Nurse Practitioner

## 2022-10-08 VITALS — BP 122/71 | HR 70 | Temp 97.4°F | Ht 75.0 in | Wt 203.8 lb

## 2022-10-08 DIAGNOSIS — R1032 Left lower quadrant pain: Secondary | ICD-10-CM | POA: Insufficient documentation

## 2022-10-08 DIAGNOSIS — R1012 Left upper quadrant pain: Secondary | ICD-10-CM | POA: Insufficient documentation

## 2022-10-08 DIAGNOSIS — K5900 Constipation, unspecified: Secondary | ICD-10-CM

## 2022-10-08 NOTE — Progress Notes (Signed)
Acute Office Visit  Subjective:     Patient ID: Luke Davila, male    DOB: 08-20-1965, 57 y.o.   MRN: [id number]  Chief Complaint  Patient presents with   Abdominal Pain    Sore stomach if he moves a certain way or presses on it for about a week.     HPI [facility]: Patient complains of abdominal pain. The pain is described as aching, and is 4/10 in intensity. Pain is located in the LLQ without radiation. Onset was 1 week ago. Symptoms have been unchanged since. Aggravating factors: activity and moving.  Alleviating factors: none. Associated symptoms: constipation. The patient denies anorexia, belching, diarrhea, fever, flatus, hematuria, melena, nausea, and vomiting. Last BM was Sunday " if I don't take laxative, I am not able to go". 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 09/29/2013   Visual field loss 09/29/2013   PVC's (premature ventricular contractions) 02/03/2018   Hyperlipidemia 02/03/2018   Bilateral primary osteoarthritis of knee 03/24/2018   Primary insomnia 02/14/2019   Sarcoidosis of lung (HCC) 09/30/2019   BPH (benign prostatic hyperplasia) 10/05/2020   Obstructive uropathy 10/05/2020   AKI (acute kidney injury) (HCC) 10/05/2020   Hospital discharge follow-up 10/15/2020   Pulmonary fibrosis (HCC) 11/27/2020   Atherosclerosis of aorta (HCC) 11/27/2020   Atherosclerosis of right carotid artery 11/27/2020   History of anemia due to CKD 02/07/2022   Left lower quadrant pain 10/08/2022   Resolved Ambulatory Problems    Diagnosis Date Noted   Right optic neuritis 08/30/2013   Left hip pain 01/29/2015   IT band syndrome 01/29/2015   Avascular necrosis of bone of left hip (HCC) 03/06/2015   Left  upper quadrant pain 10/08/2022   Past Medical History:  Diagnosis Date   Anemia of chronic renal failure, unspecified CKD stage    Arthritis    Avascular necrosis of hip (HCC)    Enlarged prostate    Glaucoma    History of gout    Pneumonia     Review of Systems  Constitutional:  Negative for chills and fever.  Respiratory:  Negative for cough and shortness of breath.   Cardiovascular:  Negative for chest pain and leg swelling.  Gastrointestinal:  Positive for constipation. Negative for blood in stool, melena, nausea and vomiting.  Genitourinary:  Negative for frequency and urgency.  Musculoskeletal:  Negative for back pain and myalgias.  Neurological:  Negative for dizziness and headaches.  Psychiatric/Behavioral:  Negative for suicidal ideas. The patient does not have insomnia.    Negative unless indicated in HPI    Objective:    BP 122/71   Pulse 70   Temp (!) 97.4 F (36.3 C) (Temporal)   Ht 6\' 3"  (1.905 m)   Wt 203 lb 12.8 oz (92.4 kg)   SpO2 100%   BMI 25.47 kg/m  BP Readings from Last 3 Encounters:  10/08/22 122/71  09/17/22 121/65  07/15/22 108/74   Wt Readings from Last 3 Encounters:  10/08/22 203 lb 12.8 oz (92.4 kg)  09/17/22 202 lb 9.6 oz (91.9 kg)  07/15/22 217 lb 3.2 oz (98.5 kg)      Physical Exam Constitutional:      Appearance: He is well-developed.  HENT:     Head: Normocephalic and atraumatic.  Eyes:     Extraocular Movements: Extraocular movements intact.     Conjunctiva/sclera: Conjunctivae normal.     Pupils: Pupils are equal, round, and reactive to light.  Cardiovascular:     Rate and Rhythm: Normal rate and regular rhythm.  Pulmonary:     Effort: Pulmonary effort is normal.     Breath sounds: Normal breath sounds.  Abdominal:     General: Bowel sounds are normal. There is no abdominal bruit. There are no signs of injury.     Palpations: Abdomen is soft. There is no hepatomegaly, splenomegaly or pulsatile mass.     Tenderness:  There is abdominal tenderness in the left lower quadrant. There is rebound. There is no right CVA tenderness or left CVA tenderness.     Hernia: There is no hernia in the umbilical area.  Skin:    General: Skin is warm and dry.  Neurological:     Mental Status: He is alert and oriented to person, place, and time. Mental status is at baseline.  Psychiatric:        Mood and Affect: Mood normal.        Behavior: Behavior normal.        Thought Content: Thought content normal.        Judgment: Judgment normal.    No results found for any visits on 10/08/22.      Assessment & Plan:  Left lower quadrant pain -     DG Abd 1 View; Future  Jacameron was seen today for abdominal pain, no acute distress Possible DD: gastritis, Diverticulitis, constipation X-ray order he will come back to get it done - increase hydration and fiber - continue OTC stool softener  Continue healthy lifestyle choices, including diet (rich in fruits, vegetables, and lean proteins, and low in salt and simple carbohydrates) and exercise (at least 30 minutes of moderate physical activity daily).     The above assessment and management plan was discussed with the patient. The patient verbalized understanding of and has agreed to the management plan. Patient is aware to call the clinic if they develop any new symptoms or if symptoms persist or worsen. Patient is aware when to return to the clinic for a follow-up visit. Patient educated on when it is appropriate to go to the emergency department.  Return if symptoms worsen or fail to improve.  Arrie Aran Santa Lighter, DNP [facility] [address] [place], [place] [address] [phone]

## 2022-10-09 IMAGING — CT CT CHEST HIGH RESOLUTION
2 of 7 series · 11 of 36 positions shown, 13 images · non-contrast
Comparison: 11/12/2020, 07/03/2020

CLINICAL DATA: Pulmonary fibrosis, sarcoidosis



[Series 4: chest 2.00 br36 s3 cor soft · coronal · 0.63mm/px · 3 of 157 slices shown]
[im 32/157  lung]
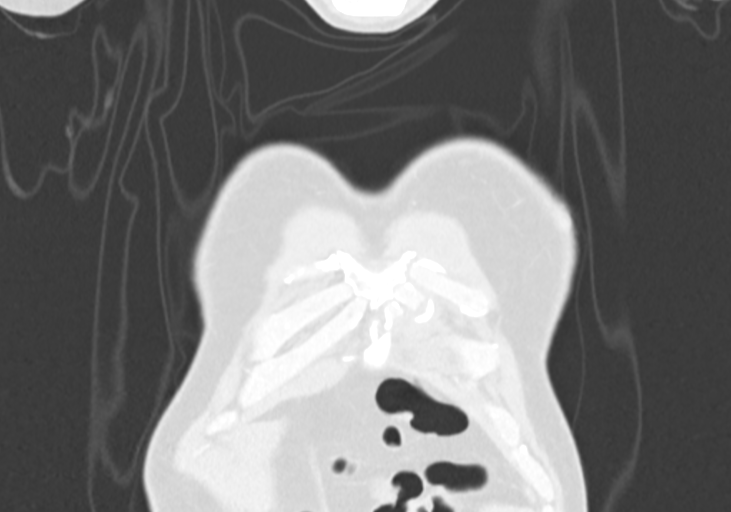
[im 63/157  lung]
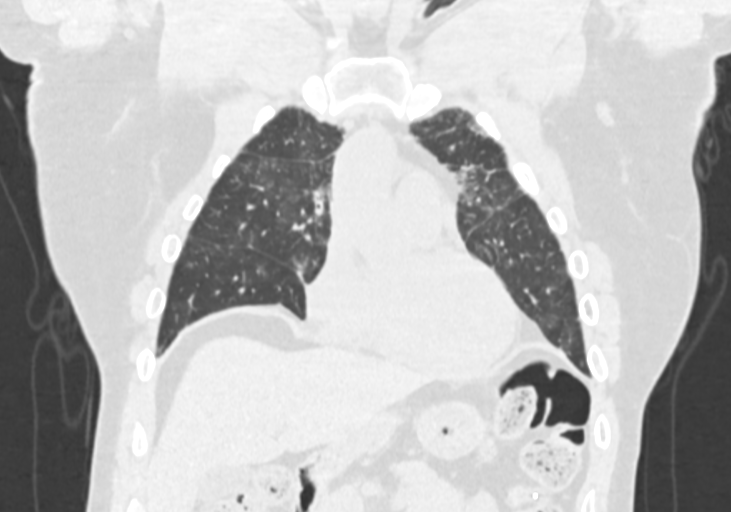
[im 94/157  lung]
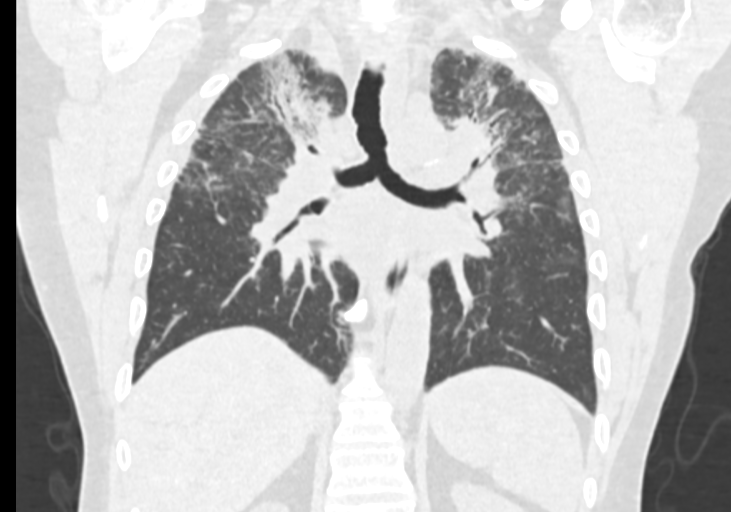

[Series 11: chest 1.00 br60 s3 high res thins 1x1 mm · axial · 0.61mm/px · z∈[+1594,+1865]mm · 8 of 321 slices shown, 10 images]
[im 25/321  mediastinal]
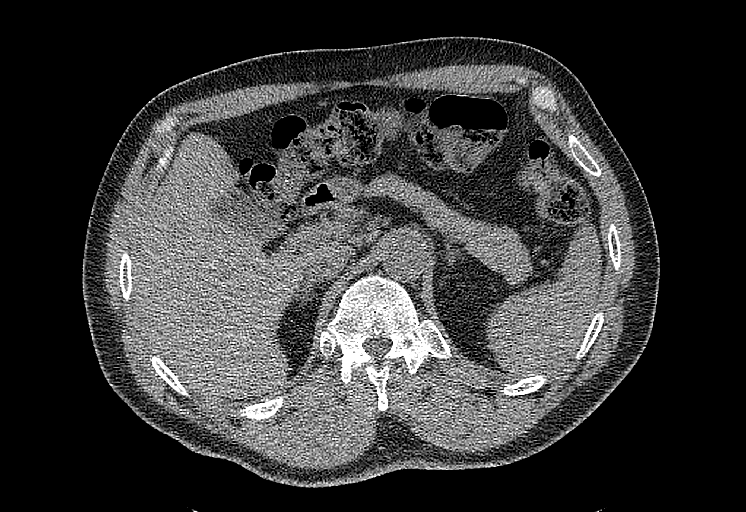
[im 25/321  lung]
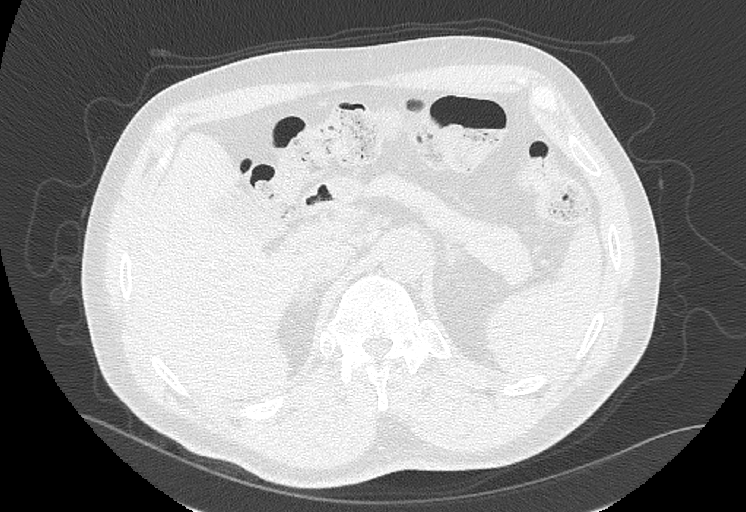
[im 74/321  lung]
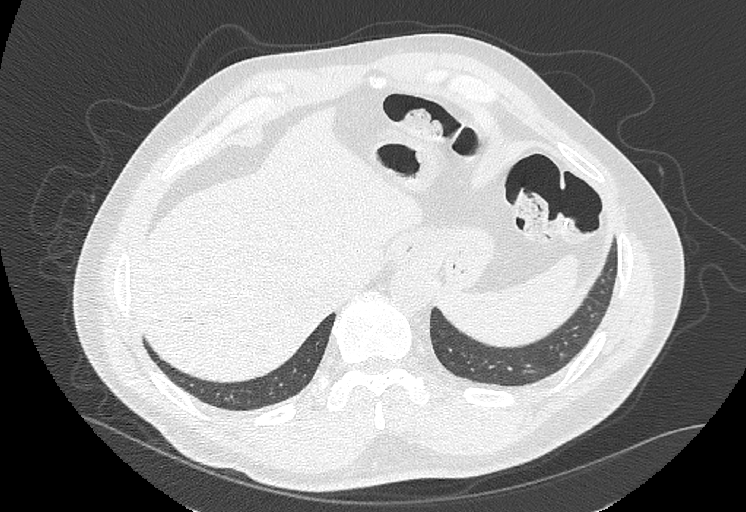
[im 99/321  lung]
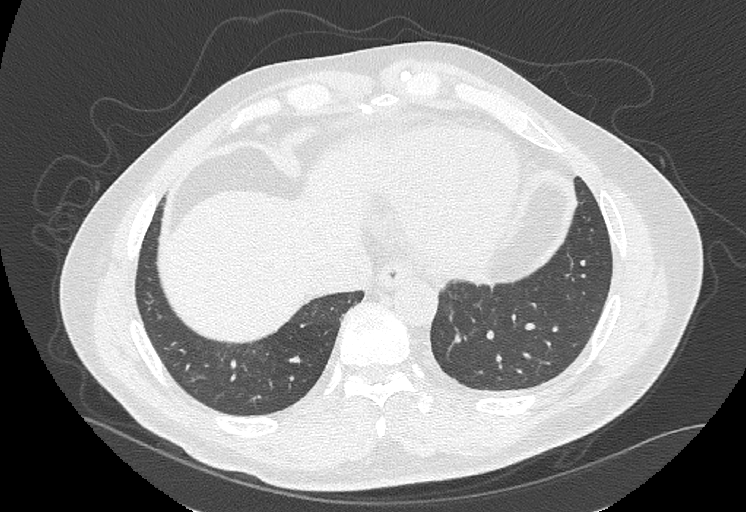
[im 148/321  lung]
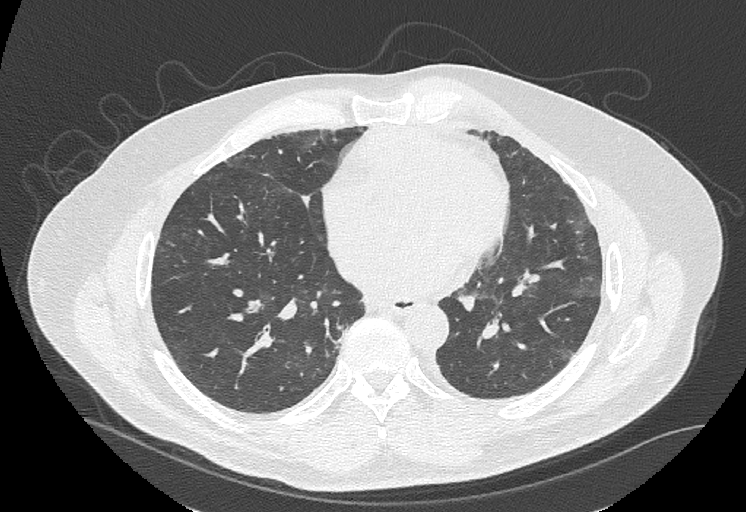
[im 173/321  mediastinal]
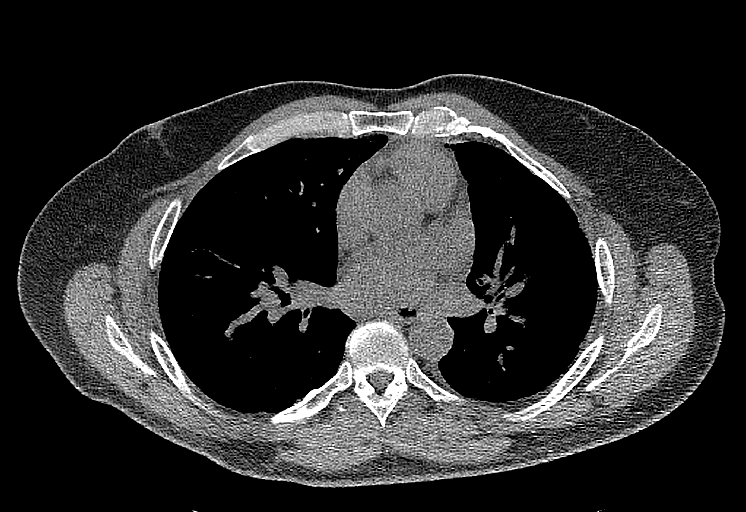
[im 173/321  lung]
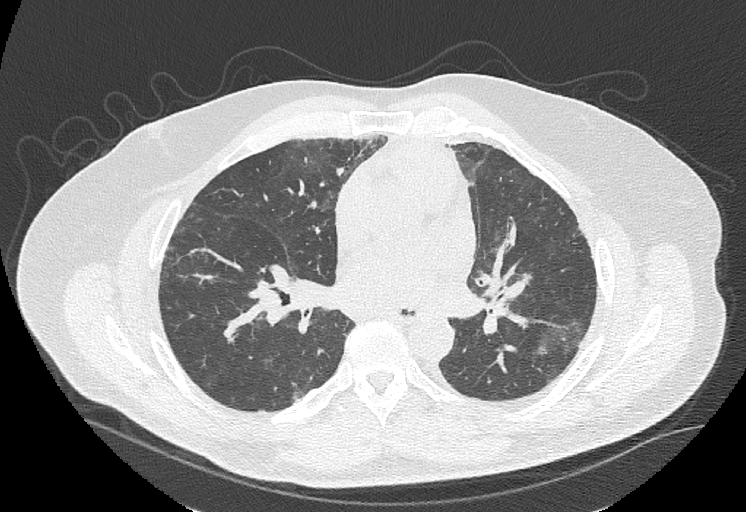
[im 222/321  lung]
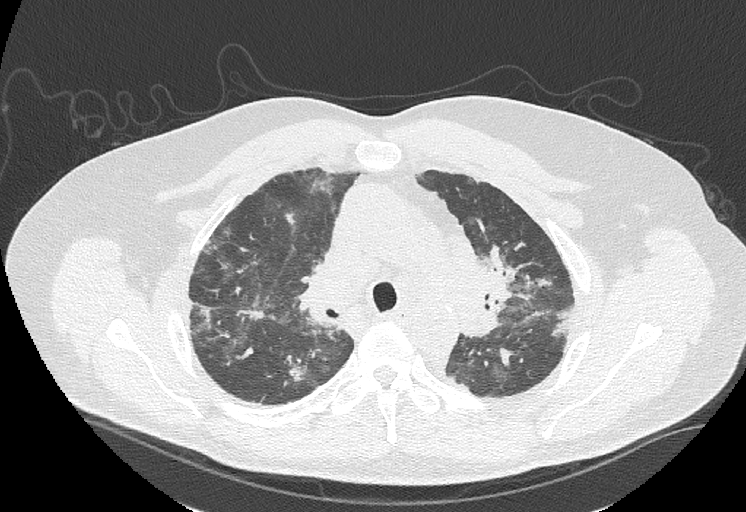
[im 247/321  lung]
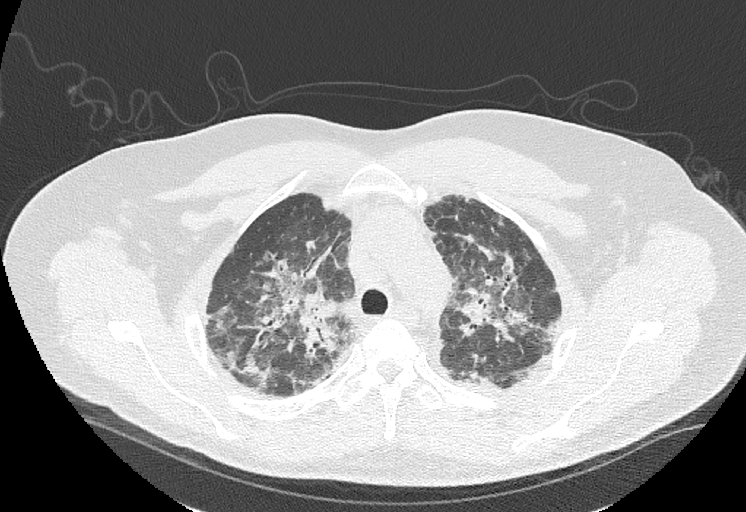
[im 296/321  lung]
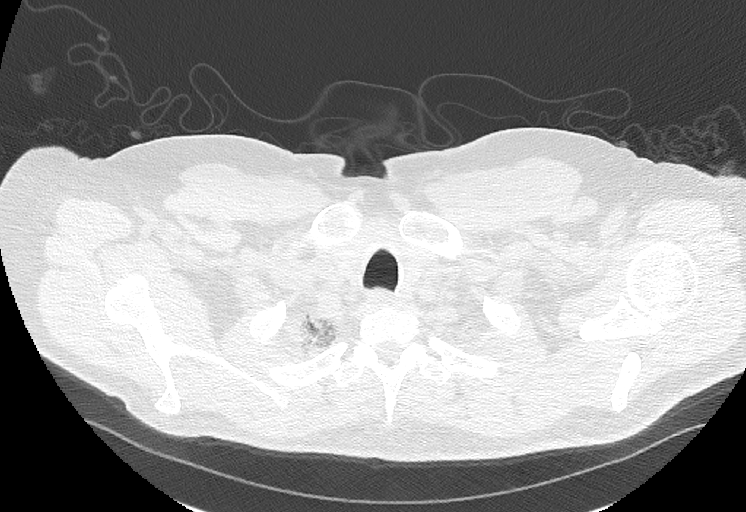

[11 of 36 positions shown; findings below may reference images not displayed]

FINDINGS: Cardiovascular: Scattered aortic atherosclerosis. Normal heart size.
No pericardial effusion.

Mediastinum/Nodes: Unchanged enlarged mediastinal and hilar lymph
nodes. Thyroid gland, trachea, and esophagus demonstrate no
significant findings.

Lungs/Pleura: Redemonstrated moderate pulmonary fibrosis in a
pattern with upper lobe predominance, with extensive, predominantly
peribronchovascular irregular interstitial opacity, fine
centrilobular nodularity, and ground-glass with consolidation,
volume loss, and architectural distortion of the upper lobes
bilaterally. Slight interval worsening of fibrotic findings,
particularly with increased ground-glass and nodularity in the upper
lobes (series 11, image 86). No significant air trapping on
expiratory phase imaging. No pleural effusion or pneumothorax.

Upper Abdomen: No acute abnormality.

Musculoskeletal: No chest wall abnormality. No suspicious osseous
lesions identified.
IMPRESSION: 1. Redemonstrated moderate pulmonary fibrosis in a pattern with
upper lobe predominance, with extensive, predominantly
peribronchovascular irregular interstitial opacity, fine
centrilobular nodularity, and ground-glass with consolidation,
volume loss, and architectural distortion of the upper lobes
bilaterally. Slight interval worsening of fibrotic findings,
particularly with increased ground-glass and nodularity in the upper
lobes. Findings are consistent with worsened, fibrotic pulmonary
sarcoidosis.
2. Unchanged enlarged mediastinal and hilar lymph nodes, consistent
with nodal sarcoidosis.

Aortic Atherosclerosis ([id number]).

## 2022-10-13 ENCOUNTER — Encounter: Payer: Self-pay | Admitting: Family Medicine

## 2022-10-13 ENCOUNTER — Other Ambulatory Visit: Payer: Self-pay | Admitting: Family Medicine

## 2022-10-13 DIAGNOSIS — J8410 Pulmonary fibrosis, unspecified: Secondary | ICD-10-CM

## 2022-10-13 DIAGNOSIS — R300 Dysuria: Secondary | ICD-10-CM

## 2022-10-15 ENCOUNTER — Other Ambulatory Visit: Payer: Self-pay | Admitting: *Deleted

## 2022-10-15 ENCOUNTER — Ambulatory Visit (INDEPENDENT_AMBULATORY_CARE_PROVIDER_SITE_OTHER): Payer: 59

## 2022-10-15 ENCOUNTER — Other Ambulatory Visit: Payer: 59

## 2022-10-15 DIAGNOSIS — E291 Testicular hypofunction: Secondary | ICD-10-CM

## 2022-10-15 DIAGNOSIS — N179 Acute kidney failure, unspecified: Secondary | ICD-10-CM

## 2022-10-15 DIAGNOSIS — R300 Dysuria: Secondary | ICD-10-CM

## 2022-10-15 LAB — URINALYSIS
Bilirubin, UA: NEGATIVE
Glucose, UA: NEGATIVE
Ketones, UA: NEGATIVE
Leukocytes,UA: NEGATIVE
Nitrite, UA: NEGATIVE
Protein,UA: NEGATIVE
RBC, UA: NEGATIVE
Specific Gravity, UA: 1.015 (ref 1.005–1.030)
Urobilinogen, Ur: 0.2 mg/dL (ref 0.2–1.0)
pH, UA: 7 (ref 5.0–7.5)

## 2022-10-15 NOTE — Progress Notes (Signed)
Testosterone injection given to right upper outer quadrant.  Patient tolerated well. 

## 2022-10-16 ENCOUNTER — Ambulatory Visit: Payer: 59 | Admitting: Nurse Practitioner

## 2022-10-16 ENCOUNTER — Telehealth: Payer: Self-pay | Admitting: Family Medicine

## 2022-10-16 ENCOUNTER — Encounter: Payer: Self-pay | Admitting: Nurse Practitioner

## 2022-10-16 VITALS — BP 94/62 | HR 88 | Temp 97.0°F | Ht 75.0 in | Wt 202.8 lb

## 2022-10-16 DIAGNOSIS — E871 Hypo-osmolality and hyponatremia: Secondary | ICD-10-CM | POA: Diagnosis not present

## 2022-10-16 LAB — BASIC METABOLIC PANEL
BUN/Creatinine Ratio: 11 (ref 9–20)
BUN: 10 mg/dL (ref 6–24)
CO2: 23 mmol/L (ref 20–29)
Calcium: 8.7 mg/dL (ref 8.7–10.2)
Chloride: 90 mmol/L — ABNORMAL LOW (ref 96–106)
Creatinine, Ser: 0.95 mg/dL (ref 0.76–1.27)
Glucose: 73 mg/dL (ref 70–99)
Potassium: 4.7 mmol/L (ref 3.5–5.2)
Sodium: 125 mmol/L — ABNORMAL LOW (ref 134–144)
eGFR: 93 mL/min/{1.73_m2} (ref >59)

## 2022-10-16 NOTE — Telephone Encounter (Signed)
It is very important to see you ASAP. T

## 2022-10-16 NOTE — Telephone Encounter (Signed)
Needs visit today. Low sodium can be dangerous.

## 2022-10-16 NOTE — Progress Notes (Signed)
Established Patient Office Visit  Subjective   Patient ID: Luke Davila, male    DOB: 15-Dec-1965  Age: 57 y.o. MRN: [id number]  Chief Complaint  Patient presents with   low sodium    HPI Luke Davila is 57 yrs old present 10/16/2022 concerns for hyponatremia. He had labs  drawn yesterday and BMP yield  NA 125 andCL 90 of   Hyponatremia A 57 year old male presents with concerns regarding hyponatremia. Lab work performed yesterday revealed a sodium level of 125 mg/dL and a chloride level of 90 mg/dL on the BMP. Kidney function is reported as normal. The patient denies experiencing any negative symptoms, such as nausea, headache, confusion, or weakness. Reports that has a few beers here there last week. His past medical history includes sarcoidosis, pulmonary fibrosis, hyperlipidemia, acute kidney injury (AKI), and atherosclerosis of the aorta and right carotid artery. There are no recent changes in medication or fluid intake that could explain the low sodium levels. Patient Active Problem List   Diagnosis Date Noted   Left lower quadrant pain 10/08/2022   History of anemia due to CKD 02/07/2022   Pulmonary fibrosis (HCC) 11/27/2020   Atherosclerosis of aorta (HCC) 11/27/2020   Atherosclerosis of right carotid artery 11/27/2020   Hospital discharge follow-up 10/15/2020   BPH (benign prostatic hyperplasia) 10/05/2020   Obstructive uropathy 10/05/2020   AKI (acute kidney injury) (HCC) 10/05/2020   Sarcoidosis of lung (HCC) 09/30/2019   Primary insomnia 02/14/2019   Bilateral primary osteoarthritis of knee 03/24/2018   PVC's (premature ventricular contractions) 02/03/2018   Hyperlipidemia 02/03/2018   Hyponatremia 08/06/2016   Constipation 10/29/2015   OA (osteoarthritis) of hip 04/09/2015   Hypogonadism in male 09/12/2014   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 09/29/2013   Visual field loss 09/29/2013   Erectile dysfunction 06/25/2012   Gout 06/25/2012   Past Medical History:   Diagnosis Date   Anemia of chronic renal failure, unspecified CKD stage    Arthritis    Avascular necrosis of hip (HCC)    LEFT   Enlarged prostate    Glaucoma    Gout    History of gout    Pneumonia    Past Surgical History:  Procedure Laterality Date   COLONOSCOPY WITH PROPOFOL N/A 11/27/2015   Procedure: COLONOSCOPY WITH PROPOFOL;  Surgeon: West Bali, MD;  [facility];  Service: Endoscopy;  Laterality: N/A;  1100   ENDOBRONCHIAL ULTRASOUND Bilateral 07/17/2021   Procedure: ENDOBRONCHIAL ULTRASOUND;  Surgeon: Chilton Greathouse, MD;  Location: [facility];  Service: Cardiopulmonary;  Laterality: Bilateral;   FINE NEEDLE ASPIRATION  07/17/2021   Procedure: FINE NEEDLE ASPIRATION (FNA) LINEAR;  Surgeon: Chilton Greathouse, MD;  Location: [facility];  Service: Cardiopulmonary;;   FOOT SURGERY Left 01/07/1983   Left hand surgery     TOTAL HIP ARTHROPLASTY Left 04/09/2015   Procedure: LEFT TOTAL HIP ARTHROPLASTY ANTERIOR APPROACH;  Surgeon: Ollen Gross, MD;  Location: [facility];  Service: Orthopedics;  Laterality: Left;   TRANSURETHRAL RESECTION OF PROSTATE N/A 03/27/2021   Procedure: BIPOLARTRANSURETHRAL RESECTION OF THE PROSTATE (TURP);  Surgeon: Rene Paci, MD;  Location: [facility];  Service: Urology;  Laterality: N/A;   VIDEO BRONCHOSCOPY  07/17/2021   Procedure: VIDEO BRONCHOSCOPY WITHOUT FLUORO;  Surgeon: Chilton Greathouse, MD;  Location: [facility];  Service: Cardiopulmonary;;   WISDOM TOOTH EXTRACTION     Social History   Tobacco Use   Smoking status: Never   Smokeless tobacco: Never  Vaping Use  Vaping status: Never Used  Substance Use Topics   Alcohol use: Yes    Comment: 3 beers daily   Drug use: No   Social History   Socioeconomic History   Marital status: Divorced    Spouse name: Ramona   Number of children: 2   Years of education: 12th   Highest education level: Not on file  Occupational History    Employer: [employer]    Comment:  Media planner  Tobacco Use   Smoking status: Never   Smokeless tobacco: Never  Vaping Use   Vaping status: Never Used  Substance and Sexual Activity   Alcohol use: Yes    Comment: 3 beers daily   Drug use: No   Sexual activity: Yes    Birth control/protection: None  Other Topics Concern   Not on file  Social History Narrative   Patient lives at home with his family.   Caffeine Use: 1 soda daily   Patient is right handed   Social Determinants of Health   Financial Resource Strain: Low Risk  (11/13/2020)   Received from [facility], [facility]   Overall Financial Resource Strain (CARDIA)    Difficulty of Paying Living Expenses: Not hard at all  Food Insecurity: No Food Insecurity (11/13/2020)   Received from [facility], [facility]   Hunger Vital Sign    Worried About Running Out of Food in the Last Year: Never true    Ran Out of Food in the Last Year: Never true  Transportation Needs: No Transportation Needs (11/13/2020)   Received from [facility], [facility]   [facility] - Transportation    Lack of Transportation (Medical): No    Lack of Transportation (Non-Medical): No  Physical Activity: Not on file  Stress: Not on file  Social Connections: Unknown (05/10/2021)   Received from [facility], [facility]   Social Network    Social Network: Not on file  Intimate Partner Violence: Unknown (04/08/2021)   Received from [facility], [facility]   HITS    Physically Hurt: Not on file    Insult or Talk Down To: Not on file    Threaten Physical Harm: Not on file    Scream or Curse: Not on file   Family Status  Relation Name Status   Mother  Deceased   Father  Other       unknown   Neg Hx  (Not Specified)  No partnership data on file   Family History  Problem Relation Age of Onset   Hypertension Mother    Colon cancer Neg Hx    No Known Allergies    Review of Systems  Constitutional:  Negative for chills and fever.  Respiratory:   Negative for cough and wheezing.   Gastrointestinal:  Negative for nausea and vomiting.  Musculoskeletal:  Negative for falls.  Neurological:  Negative for dizziness, tingling, speech change, seizures and headaches.   Negative unless indicated in HPI   Objective:     BP 94/62   Pulse 88   Temp (!) 97 F (36.1 C) (Temporal)   Ht 6\' 3"  (1.905 m)   Wt 202 lb 12.8 oz (92 kg)   SpO2 98%   BMI 25.35 kg/m  BP Readings from Last 3 Encounters:  10/16/22 94/62  10/08/22 122/71  09/17/22 121/65   Wt Readings from Last 3 Encounters:  10/16/22 202 lb 12.8 oz (92 kg)  10/08/22 203 lb 12.8 oz (92.4 kg)  09/17/22 202 lb 9.6 oz (91.9 kg)      Physical Exam Vitals and nursing note reviewed.  Constitutional:      Appearance: Normal appearance.  HENT:     Head: Normocephalic and atraumatic.  Eyes:     Extraocular Movements: Extraocular movements intact.     Conjunctiva/sclera: Conjunctivae normal.     Pupils: Pupils are equal, round, and reactive to light.  Neck:     Vascular: No carotid bruit.  Cardiovascular:     Rate and Rhythm: Normal rate and regular rhythm.  Pulmonary:     Effort: Pulmonary effort is normal.     Breath sounds: Normal breath sounds.  Abdominal:     General: Bowel sounds are normal.     Palpations: Abdomen is soft.  Musculoskeletal:     Cervical back: Normal range of motion and neck supple. No rigidity or tenderness.     Right lower leg: No edema.     Left lower leg: No edema.  Lymphadenopathy:     Cervical: No cervical adenopathy.  Skin:    General: Skin is warm and dry.     Findings: No rash.  Neurological:     General: No focal deficit present.     Mental Status: He is alert and oriented to person, place, and time. Mental status is at baseline.  Psychiatric:        Mood and Affect: Mood normal.        Behavior: Behavior normal.        Thought Content: Thought content normal.        Judgment: Judgment normal.      No results found for any  visits on 10/16/22.  Last CBC Lab Results  Component Value Date   WBC 4.1 09/17/2022   HGB 10.5 (L) 09/17/2022   HCT 33.2 (L) 09/17/2022   MCV 82 09/17/2022   MCH 25.9 (L) 09/17/2022   RDW 13.2 09/17/2022   PLT 275 09/17/2022   Last metabolic panel Lab Results  Component Value Date   GLUCOSE 73 10/15/2022   NA 125 (L) 10/15/2022   K 4.7 10/15/2022   CL 90 (L) 10/15/2022   CO2 23 10/15/2022   BUN 10 10/15/2022   CREATININE 0.95 10/15/2022   EGFR 93 10/15/2022   CALCIUM 8.7 10/15/2022   PROT 6.5 09/17/2022   ALBUMIN 4.2 09/17/2022   LABGLOB 2.3 09/17/2022   AGRATIO 1.9 06/10/2022   BILITOT 0.7 09/17/2022   ALKPHOS 145 (H) 09/17/2022   AST 27 09/17/2022   ALT 10 09/17/2022   ANIONGAP 7 09/27/2021   Last lipids Lab Results  Component Value Date   CHOL 144 04/02/2022   HDL 56 04/02/2022   LDLCALC 76 04/02/2022   TRIG 58 04/02/2022   CHOLHDL 2.6 04/02/2022   Last hemoglobin A1c Lab Results  Component Value Date   HGBA1C 5.2 09/01/2022   Last thyroid functions Lab Results  Component Value Date   TSH 2.970 07/15/2018   T4TOTAL 3.2 (L) 07/31/2014        Assessment & Plan:  Hyponatremia -     Osmolality -     Sodium, urine, random -     Triglycerides -     BMP8+EGFR   Antuane is 57 yrs old [place] American male, no acute distress Hyponatremia.:He is at baseline, no changes in mental status.  Work notes provided  Encourage healthy lifestyle choices, including diet (rich in fruits, vegetables, and lean proteins, and low in salt and simple carbohydrates) and  exercise (at least 30 minutes of moderate physical activity daily).     The above assessment and management plan was discussed with the patient. The patient verbalized understanding of and has agreed to the management plan. Patient is aware to call the clinic if they develop any new symptoms or if symptoms persist or worsen. Patient is aware when to return to the clinic for a follow-up visit. Patient  educated on when it is appropriate to go to the emergency department.  Return for folow as already schedule with PCP.    Arrie Aran Santa Lighter, DNP [facility] [address] [place], [place] [address] [phone]

## 2022-10-16 NOTE — Telephone Encounter (Signed)
Pt aware and verbalized understanding.  

## 2022-10-17 ENCOUNTER — Observation Stay (HOSPITAL_COMMUNITY)
Admission: EM | Admit: 2022-10-17 | Discharge: 2022-10-18 | Disposition: A | Discharge status: Home / Self Care | Payer: 59 | Attending: Family Medicine | Admitting: Family Medicine

## 2022-10-17 ENCOUNTER — Encounter (HOSPITAL_COMMUNITY): Payer: Self-pay | Admitting: Emergency Medicine

## 2022-10-17 ENCOUNTER — Telehealth: Payer: Self-pay | Admitting: Family Medicine

## 2022-10-17 ENCOUNTER — Other Ambulatory Visit: Payer: Self-pay

## 2022-10-17 DIAGNOSIS — I6521 Occlusion and stenosis of right carotid artery: Secondary | ICD-10-CM | POA: Diagnosis present

## 2022-10-17 DIAGNOSIS — J8410 Pulmonary fibrosis, unspecified: Secondary | ICD-10-CM | POA: Diagnosis not present

## 2022-10-17 DIAGNOSIS — N139 Obstructive and reflux uropathy, unspecified: Secondary | ICD-10-CM | POA: Diagnosis present

## 2022-10-17 DIAGNOSIS — I700 Atherosclerosis of aorta: Secondary | ICD-10-CM | POA: Diagnosis present

## 2022-10-17 DIAGNOSIS — Z96642 Presence of left artificial hip joint: Secondary | ICD-10-CM | POA: Diagnosis not present

## 2022-10-17 DIAGNOSIS — F5101 Primary insomnia: Secondary | ICD-10-CM | POA: Diagnosis not present

## 2022-10-17 DIAGNOSIS — Z79899 Other long term (current) drug therapy: Secondary | ICD-10-CM | POA: Diagnosis not present

## 2022-10-17 DIAGNOSIS — E785 Hyperlipidemia, unspecified: Secondary | ICD-10-CM | POA: Diagnosis present

## 2022-10-17 DIAGNOSIS — N400 Enlarged prostate without lower urinary tract symptoms: Secondary | ICD-10-CM | POA: Diagnosis present

## 2022-10-17 DIAGNOSIS — D860 Sarcoidosis of lung: Secondary | ICD-10-CM | POA: Diagnosis present

## 2022-10-17 DIAGNOSIS — E291 Testicular hypofunction: Secondary | ICD-10-CM | POA: Diagnosis present

## 2022-10-17 DIAGNOSIS — I493 Ventricular premature depolarization: Secondary | ICD-10-CM | POA: Diagnosis present

## 2022-10-17 DIAGNOSIS — K5900 Constipation, unspecified: Secondary | ICD-10-CM | POA: Diagnosis present

## 2022-10-17 DIAGNOSIS — E871 Hypo-osmolality and hyponatremia: Principal | ICD-10-CM | POA: Diagnosis present

## 2022-10-17 DIAGNOSIS — R7989 Other specified abnormal findings of blood chemistry: Secondary | ICD-10-CM | POA: Diagnosis present

## 2022-10-17 DIAGNOSIS — M169 Osteoarthritis of hip, unspecified: Secondary | ICD-10-CM | POA: Diagnosis present

## 2022-10-17 LAB — BASIC METABOLIC PANEL
Anion gap: 8 (ref 5–15)
BUN: 16 mg/dL (ref 6–20)
CO2: 24 mmol/L (ref 22–32)
Calcium: 8.9 mg/dL (ref 8.9–10.3)
Chloride: 92 mmol/L — ABNORMAL LOW (ref 98–111)
Creatinine, Ser: 0.94 mg/dL (ref 0.61–1.24)
GFR, Estimated: >60 mL/min (ref >60)
Glucose, Bld: 106 mg/dL — ABNORMAL HIGH (ref 70–99)
Potassium: 3.9 mmol/L (ref 3.5–5.1)
Sodium: 124 mmol/L — ABNORMAL LOW (ref 135–145)

## 2022-10-17 LAB — CBC WITH DIFFERENTIAL/PLATELET
Abs Immature Granulocytes: 0.01 10*3/uL (ref 0.00–0.07)
Basophils Absolute: 0 10*3/uL (ref 0.0–0.1)
Basophils Relative: 0 %
Eosinophils Absolute: 0 10*3/uL (ref 0.0–0.5)
Eosinophils Relative: 0 %
HCT: 33.8 % — ABNORMAL LOW (ref 39.0–52.0)
Hemoglobin: 11.5 g/dL — ABNORMAL LOW (ref 13.0–17.0)
Immature Granulocytes: 0 %
Lymphocytes Relative: 6 %
Lymphs Abs: 0.3 10*3/uL — ABNORMAL LOW (ref 0.7–4.0)
MCH: 26.6 pg (ref 26.0–34.0)
MCHC: 34 g/dL (ref 30.0–36.0)
MCV: 78.1 fL — ABNORMAL LOW (ref 80.0–100.0)
Monocytes Absolute: 0.1 10*3/uL (ref 0.1–1.0)
Monocytes Relative: 2 %
Neutro Abs: 5.2 10*3/uL (ref 1.7–7.7)
Neutrophils Relative %: 92 %
Platelets: 267 10*3/uL (ref 150–400)
RBC: 4.33 MIL/uL (ref 4.22–5.81)
RDW: 13.1 % (ref 11.5–15.5)
WBC: 5.6 10*3/uL (ref 4.0–10.5)
nRBC: 0 % (ref 0.0–0.2)

## 2022-10-17 LAB — TSH: TSH: 1.45 u[IU]/mL (ref 0.350–4.500)

## 2022-10-17 LAB — HEPATIC FUNCTION PANEL
ALT: 16 U/L (ref 0–44)
AST: 32 U/L (ref 15–41)
Albumin: 4 g/dL (ref 3.5–5.0)
Alkaline Phosphatase: 132 U/L — ABNORMAL HIGH (ref 38–126)
Bilirubin, Direct: 0.1 mg/dL (ref 0.0–0.2)
Indirect Bilirubin: 1 mg/dL — ABNORMAL HIGH (ref 0.3–0.9)
Total Bilirubin: 1.1 mg/dL (ref 0.3–1.2)
Total Protein: 7.6 g/dL (ref 6.5–8.1)

## 2022-10-17 LAB — HIV ANTIBODY (ROUTINE TESTING W REFLEX): HIV Screen 4th Generation wRfx: NONREACTIVE

## 2022-10-17 LAB — RAPID URINE DRUG SCREEN, HOSP PERFORMED
Amphetamines: NOT DETECTED
Barbiturates: NOT DETECTED
Benzodiazepines: NOT DETECTED
Cocaine: NOT DETECTED
Opiates: NOT DETECTED
Tetrahydrocannabinol: NOT DETECTED

## 2022-10-17 LAB — OSMOLALITY: Osmolality: 276 mosm/kg (ref 275–295)

## 2022-10-17 LAB — URINE CULTURE

## 2022-10-17 LAB — CORTISOL: Cortisol, Plasma: 5.8 ug/dL

## 2022-10-17 LAB — CREATININE, URINE, RANDOM: Creatinine, Urine: 42 mg/dL

## 2022-10-17 LAB — SODIUM
Sodium: 129 mmol/L — ABNORMAL LOW (ref 135–145)
Sodium: 127 mmol/L — ABNORMAL LOW (ref 135–145)

## 2022-10-17 LAB — OSMOLALITY, URINE: Osmolality, Ur: 103 mosm/kg — ABNORMAL LOW (ref 300–900)

## 2022-10-17 LAB — URIC ACID: Uric Acid, Serum: 5.7 mg/dL (ref 3.7–8.6)

## 2022-10-17 LAB — SODIUM, URINE, RANDOM: Sodium, Ur: 12 mmol/L

## 2022-10-17 MED ORDER — ONDANSETRON HCL 4 MG/2ML IJ SOLN: 4.0000 mg | Freq: Four times a day (QID) | INTRAMUSCULAR | Status: DC | PRN

## 2022-10-17 MED ORDER — OXYCODONE HCL 5 MG PO TABS: 5.0000 mg | ORAL_TABLET | Freq: Four times a day (QID) | ORAL | Status: DC | PRN

## 2022-10-17 MED ORDER — FENTANYL CITRATE PF 50 MCG/ML IJ SOSY: 12.5000 ug | PREFILLED_SYRINGE | INTRAMUSCULAR | Status: DC | PRN

## 2022-10-17 MED ORDER — TRAZODONE HCL 50 MG PO TABS: 25.0000 mg | ORAL_TABLET | Freq: Every evening | ORAL | Status: DC | PRN

## 2022-10-17 MED ORDER — ACETAMINOPHEN 650 MG RE SUPP: 650.0000 mg | Freq: Four times a day (QID) | RECTAL | Status: DC | PRN

## 2022-10-17 MED ORDER — SODIUM CHLORIDE 0.9 % IV SOLN: INTRAVENOUS | Status: DC

## 2022-10-17 MED ORDER — ENOXAPARIN SODIUM 40 MG/0.4ML IJ SOSY
40.0000 mg | PREFILLED_SYRINGE | INTRAMUSCULAR | Status: DC
  Administered 2022-10-17: 40 mg via SUBCUTANEOUS
  Filled 2022-10-17: qty 0.4

## 2022-10-17 MED ORDER — SODIUM CHLORIDE 0.9 % IV BOLUS
1000.0000 mL | Freq: Once | INTRAVENOUS | Status: AC
  Administered 2022-10-17: 1000 mL via INTRAVENOUS

## 2022-10-17 MED ORDER — ALBUTEROL SULFATE (2.5 MG/3ML) 0.083% IN NEBU
2.5000 mg | INHALATION_SOLUTION | RESPIRATORY_TRACT | Status: DC | PRN
  Administered 2022-10-17: 2.5 mg via RESPIRATORY_TRACT
  Filled 2022-10-17: qty 3

## 2022-10-17 MED ORDER — ACETAMINOPHEN 325 MG PO TABS: 650.0000 mg | ORAL_TABLET | Freq: Four times a day (QID) | ORAL | Status: DC | PRN

## 2022-10-17 MED ORDER — ONDANSETRON HCL 4 MG PO TABS: 4.0000 mg | ORAL_TABLET | Freq: Four times a day (QID) | ORAL | Status: DC | PRN

## 2022-10-17 NOTE — ED Triage Notes (Signed)
Pt sent by Pcp fo Na+ level of 125.

## 2022-10-17 NOTE — ED Provider Notes (Signed)
[place] EMERGENCY DEPARTMENT AT [facility] Provider Note   CSN: [id number] Arrival date & time: 10/17/22  9147     History  Chief Complaint  Patient presents with   hyponatremia    Luke Davila is a 57 y.o. male.  He has PMH of PVCs, hyperlipidemia, close testosterone, AKI, pulmonary fibrosis.  He is on methotrexate.  Presents today for abnormal labs.  Was told his sodium was 125.  Had been seen by PCP couple days ago had labs drawn and noted low sodium, had rechecked yesterday and received phone call this morning to come and get checked for low sodium.  Denies dizziness, gait instability, nausea or vomiting, headache, or other symptoms, states he feels well, denies recent nausea vomiting diarrhea.  Denies any excessive sweating or physical exertion.  He states he used to drink alcohol but no longer does except for on occasion.  Denies excess water intake.  HPI     Home Medications Prior to Admission medications   Medication Sig Start Date End Date Taking? Authorizing Provider  albuterol (VENTOLIN HFA) 108 (90 Base) MCG/ACT inhaler TAKE 2 PUFFS BY MOUTH EVERY 6 HOURS AS NEEDED FOR WHEEZE OR SHORTNESS OF BREATH 10/14/22   Mechele Claude, MD  methotrexate (RHEUMATREX) 2.5 MG tablet Take 8 tablets (20 mg total) by mouth once a week. Caution:Chemotherapy. Protect from light. 08/04/22 07/06/23  Chilton Greathouse, MD  predniSONE (DELTASONE) 20 MG tablet Take 1 tablet (20 mg total) by mouth daily with breakfast. 09/17/22   Mechele Claude, MD  sildenafil (VIAGRA) 100 MG tablet Take 0.5-1 tablets (50-100 mg total) by mouth daily as needed for erectile dysfunction (sex). 03/25/22   Mechele Claude, MD  tamsulosin [facility]) 0.4 MG CAPS capsule  09/25/14   [provider]  testosterone cypionate (DEPOTESTOSTERONE CYPIONATE) 200 MG/ML injection Inject 1 mL (200 mg total) into the muscle every 14 (fourteen) days. INJECT 1 ML (200 MG TOTAL) INTO THE MUSCLE EVERY 14 DAYS 09/16/22    Mechele Claude, MD      Allergies    Patient has no known allergies.    Review of Systems   Review of Systems  Physical Exam Updated Vital Signs BP (!) 145/95   Pulse 71   Resp (!) 21   Ht 6\' 3"  (1.905 m)   Wt 93 kg   SpO2 100%   BMI 25.62 kg/m  Physical Exam  ED Results / Procedures / Treatments   Labs (all labs ordered are listed, but only abnormal results are displayed) Labs Reviewed  BASIC METABOLIC PANEL - Abnormal; Notable for the following components:      Result Value   Sodium 124 (*)    Chloride 92 (*)    Glucose, Bld 106 (*)    All other components within normal limits  HEPATIC FUNCTION PANEL - Abnormal; Notable for the following components:   Alkaline Phosphatase 132 (*)    Indirect Bilirubin 1.0 (*)    All other components within normal limits  CBC WITH DIFFERENTIAL/PLATELET - Abnormal; Notable for the following components:   Hemoglobin 11.5 (*)    HCT 33.8 (*)    MCV 78.1 (*)    Lymphs Abs 0.3 (*)    All other components within normal limits  TSH  SODIUM, URINE, RANDOM  OSMOLALITY, URINE    EKG None  Radiology No results found.  Procedures Procedures    Medications Ordered in ED Medications  sodium chloride 0.9 % bolus 1,000 mL (1,000 mLs Intravenous Bolus from  Bag 10/17/22 1042)    ED Course/ Medical Decision Making/ A&P Clinical Course as of 10/17/22 1213  Fri Oct 17, 2022  1153 Sodium(!): 124 Baseline around 135-139, no diuretics, no daily etoh, recommend admission for monitoring and judicious sodium replacement  [SG]  1212 Hyponatremia, has been 1 2:25 days today 124, he is euvolemic, do not see any medications on his list that would cause hyponatremia, denies excessive water intake, denies any GI losses or decreased p.o. intake.  Due to downtrending hyponatremia without obvious cause would likely need to be admitted, hospitalist consult pending [CB]    Clinical Course User Index [CB] Carmel Sacramento A, PA-C [SG] Sloan Leiter,  DO                                 Medical Decision Making Differential diagnosis: Hyponatremia, SIADH, dehydration, hyperkalemia, other ED course: Patient presents for abnormal labs with sodium 125 outpatient labs.  He is asymptomatic of this, otherwise well-appearing, he appears to be euvolemic, given liter of fluids, unfortunately his sodium today is down to 124, renal function is normal, urine sodium and urine osmolality pending, TSH is normal.  Discussed with Dr. Laural Benes from [facility] hospitalist who will come evaluate patient for admission.  Patient is agreeable with admission  Amount and/or Complexity of Data Reviewed Labs: ordered. Decision-making details documented in ED Course.  Risk Decision regarding hospitalization.           Final Clinical Impression(s) / ED Diagnoses Final diagnoses:  Hyponatremia    Rx / DC Orders ED Discharge Orders     None         Ma Rings, PA-C 10/17/22 1218    Goodwin, [place] A, DO 10/17/22 1317

## 2022-10-17 NOTE — H&P (Signed)
History and Physical  [facility]  [name] ZOX:[id number] DOB: [birth year] DOA: 10/17/2022  PCP: Mechele Claude, MD  Patient coming from: Home  Level of care: Telemetry  I have personally briefly reviewed patient's old medical records in [facility]  Chief Complaint: abnormal lab   HPI: Luke Davila is a 57 year old gentleman with sarcoidosis of the lung, osteoarthritis, hyperlipidemia, hypogonadism on testosterone, gout, obstructive uropathy who has been self catheterizing for the last couple years, reports that he has his blood work checked frequently at least a couple times per month and was noted to have a sodium of 125 by his PCP.  This is unusual for him.  His sodium is normally within normal limits.  He does not take diuretics.  He has not been drinking excessive free water or other soft drinks or fluids.  He denies binge drinking alcohol.  Denies recreational drug use.  He was sent to the ED for abnormal labs.  His mentation has been normal.  His sodium was repeated and noted to be 124.  He was given a liter of saline infusion in the ED and admission was requested for management of asymptomatic hyponatremia.    Past Medical History:  Diagnosis Date   Anemia of chronic renal failure, unspecified CKD stage    Arthritis    Avascular necrosis of hip (HCC)    LEFT   Enlarged prostate    Glaucoma    Gout    History of gout    Pneumonia     Past Surgical History:  Procedure Laterality Date   COLONOSCOPY WITH PROPOFOL N/A 11/27/2015   Procedure: COLONOSCOPY WITH PROPOFOL;  Surgeon: West Bali, MD;  [facility];  Service: Endoscopy;  Laterality: N/A;  1100   ENDOBRONCHIAL ULTRASOUND Bilateral 07/17/2021   Procedure: ENDOBRONCHIAL ULTRASOUND;  Surgeon: Chilton Greathouse, MD;  [facility];  Service: Cardiopulmonary;  Laterality: Bilateral;   FINE NEEDLE ASPIRATION  07/17/2021   Procedure: FINE NEEDLE ASPIRATION (FNA) LINEAR;  Surgeon:  Chilton Greathouse, MD;  [facility];  Service: Cardiopulmonary;;   FOOT SURGERY Left 01/07/1983   Left hand surgery     TOTAL HIP ARTHROPLASTY Left 04/09/2015   Procedure: LEFT TOTAL HIP ARTHROPLASTY ANTERIOR APPROACH;  Surgeon: Ollen Gross, MD;  [facility];  Service: Orthopedics;  Laterality: Left;   TRANSURETHRAL RESECTION OF PROSTATE N/A 03/27/2021   Procedure: BIPOLARTRANSURETHRAL RESECTION OF THE PROSTATE (TURP);  Surgeon: Rene Paci, MD;  [facility];  Service: Urology;  Laterality: N/A;   VIDEO BRONCHOSCOPY  07/17/2021   Procedure: VIDEO BRONCHOSCOPY WITHOUT FLUORO;  Surgeon: Chilton Greathouse, MD;  [facility];  Service: Cardiopulmonary;;   WISDOM TOOTH EXTRACTION       reports that he has never smoked. He has never used smokeless tobacco. He reports current alcohol use. He reports that he does not use drugs.  No Known Allergies  Family History  Problem Relation Age of Onset   Hypertension Mother    Colon cancer Neg Hx     Prior to Admission medications   Medication Sig Start Date End Date Taking? Authorizing Provider  albuterol (VENTOLIN HFA) 108 (90 Base) MCG/ACT inhaler TAKE 2 PUFFS BY MOUTH EVERY 6 HOURS AS NEEDED FOR WHEEZE OR SHORTNESS OF BREATH 10/14/22   Mechele Claude, MD  methotrexate (RHEUMATREX) 2.5 MG tablet Take 8 tablets (20 mg total) by mouth once a week. Caution:Chemotherapy. Protect from light. 08/04/22 07/06/23  Chilton Greathouse, MD  predniSONE (DELTASONE)  20 MG tablet Take 1 tablet (20 mg total) by mouth daily with breakfast. 09/17/22   Mechele Claude, MD  sildenafil (VIAGRA) 100 MG tablet Take 0.5-1 tablets (50-100 mg total) by mouth daily as needed for erectile dysfunction (sex). 03/25/22   Mechele Claude, MD  tamsulosin [facility]) 0.4 MG CAPS capsule  09/25/14   [provider]  testosterone cypionate (DEPOTESTOSTERONE CYPIONATE) 200 MG/ML injection Inject 1 mL (200 mg total) into the muscle every 14 (fourteen)  days. INJECT 1 ML (200 MG TOTAL) INTO THE MUSCLE EVERY 14 DAYS 09/16/22   Mechele Claude, MD    Physical Exam: Vitals:   10/17/22 1045 10/17/22 1100 10/17/22 1115 10/17/22 1130  BP: 112/88 139/75 117/88 (!) 145/95  Pulse: 66 79 75 71  Resp: (!) 22 (!) 25 17 (!) 21  SpO2: 100% 96% 98% 100%  Weight:      Height:        Constitutional: NAD, calm, comfortable Eyes: PERRL, lids and conjunctivae normal ENMT: Mucous membranes are moist. Posterior pharynx clear of any exudate or lesions.Normal dentition.  Neck: normal, supple, no masses, no thyromegaly Respiratory: clear to auscultation bilaterally, no wheezing, no crackles. Normal respiratory effort. No accessory muscle use.  Cardiovascular: normal s1, s2 sounds, no murmurs / rubs / gallops. No extremity edema. 2+ pedal pulses. No carotid bruits.  Abdomen: no tenderness, no masses palpated. No hepatosplenomegaly. Bowel sounds positive.  Musculoskeletal: no clubbing / cyanosis. No joint deformity upper and lower extremities. Good ROM, no contractures. Normal muscle tone.  Skin: no rashes, lesions, ulcers. No induration Neurologic: CN 2-12 grossly intact. Sensation intact, DTR normal. Strength 5/5 in all 4.  Psychiatric: Normal judgment and insight. Alert and oriented x 3. Normal mood.   Labs on Admission: I have personally reviewed following labs and imaging studies  CBC: Recent Labs  Lab 10/17/22 1022  WBC 5.6  NEUTROABS 5.2  HGB 11.5*  HCT 33.8*  MCV 78.1*  PLT 267   Basic Metabolic Panel: Recent Labs  Lab 10/15/22 1410 10/16/22 1425 10/17/22 1030  NA 125* 125* 124*  K 4.7 4.4 3.9  CL 90* 88* 92*  CO2 23 23 24   GLUCOSE 73 74 106*  BUN 10 10 16   CREATININE 0.95 1.02 0.94  CALCIUM 8.7 9.0 8.9   GFR: Estimated Creatinine Clearance: 103.6 mL/min (by C-G formula based on SCr of 0.94 mg/dL). Liver Function Tests: Recent Labs  Lab 10/17/22 1030  AST 32  ALT 16  ALKPHOS 132*  BILITOT 1.1  PROT 7.6  ALBUMIN 4.0   No  results for input(s): "LIPASE", "AMYLASE" in the last 168 hours. No results for input(s): "AMMONIA" in the last 168 hours. Coagulation Profile: No results for input(s): "INR", "PROTIME" in the last 168 hours. Cardiac Enzymes: No results for input(s): "CKTOTAL", "CKMB", "CKMBINDEX", "TROPONINI" in the last 168 hours. BNP (last 3 results) No results for input(s): "PROBNP" in the last 8760 hours. HbA1C: No results for input(s): "HGBA1C" in the last 72 hours. CBG: No results for input(s): "GLUCAP" in the last 168 hours. Lipid Profile: Recent Labs    10/16/22 1425  TRIG 49   Thyroid Function Tests: Recent Labs    10/17/22 1022  TSH 1.450   Anemia Panel: No results for input(s): "VITAMINB12", "FOLATE", "FERRITIN", "TIBC", "IRON", "RETICCTPCT" in the last 72 hours. Urine analysis:    Component Value Date/Time   COLORURINE STRAW (A) 09/27/2021 1740   APPEARANCEUR Clear 10/15/2022 1548   LABSPEC 1.010 09/27/2021 1740   PHURINE 6.0  09/27/2021 1740   GLUCOSEU Negative 10/15/2022 1548   HGBUR MODERATE (A) 09/27/2021 1740   BILIRUBINUR Negative 10/15/2022 1548   KETONESUR NEGATIVE 09/27/2021 1740   PROTEINUR Negative 10/15/2022 1548   PROTEINUR NEGATIVE 09/27/2021 1740   UROBILINOGEN negative 12/15/2012 1806   NITRITE Negative 10/15/2022 1548   NITRITE NEGATIVE 09/27/2021 1740   LEUKOCYTESUR Negative 10/15/2022 1548   LEUKOCYTESUR NEGATIVE 09/27/2021 1740    Radiological Exams on Admission: No results found.  EKG: Independently reviewed.   Assessment/Plan Principal Problem:   Hyponatremia Active Problems:   Hypogonadism in male   OA (osteoarthritis) of hip   Constipation   PVC's (premature ventricular contractions)   Hyperlipidemia   Primary insomnia   Sarcoidosis of lung (HCC)   BPH (benign prostatic hyperplasia)   Obstructive uropathy   Pulmonary fibrosis (HCC)   Atherosclerosis of aorta (HCC)   Atherosclerosis of right carotid artery    Asymptomatic acute  hyponatremia - cause unknown - does report poor oral intake for last couple of days - reports has been self-cathing as normal with no changes - no need for hypertonic saline at this time - denies diuretic use - check urine/serum osmolal, urine lytes  - continue normal saline infusion - follow sodium level closely  - cortisol level pending to rule out adrenal insufficiency  Obstructive uropathy - pt does void spontaneously but bladder does not empty completely - he has been self catheterizing for a long time - continue to monitor intake/output closely  - bladder scan frequently to be sure bladder is being emptied fully  Hypogonadism  - he is on biweekly testosterone injections - check cortisol level to rule out adrenal insufficiency   Pulmonary Sarcoidosis  - he is managed by pulmonologist for this   DVT prophylaxis: enoxaparin   Code Status: Full   Family Communication: wife bedside update   Disposition Plan: anticipate DC home tomorrow  Consults called:   Admission status: OBV   Level of care: Telemetry Standley Dakins MD [facility] How to contact the [facility] Attending or Consulting provider 7A - 7P or covering provider during after hours 7P -7A, for this patient?  Check the care team in [facility] and look for a) attending/consulting [facility] provider listed and b) the [facility] team listed Log into [email or web address] and use [facility]'s universal password to access. If you do not have the password, please contact the hospital operator. Locate the [facility] provider you are looking for under [facility] and page to a number that you can be directly reached. If you still have difficulty reaching the provider, please page the [facility] (Director on Call) for the Hospitalists listed on amion for assistance.   If 7PM-7AM, please contact night-coverage [email or web address] Password [facility]  10/17/2022, 12:34 PM

## 2022-10-17 NOTE — Telephone Encounter (Signed)
Sodium remains critical low, pt needs to go to the hospital. Luke Davila called and left pt a detailed message to call office as soon as possible to discuss labs.

## 2022-10-17 NOTE — Telephone Encounter (Signed)
Luke Davila spoke with patient .  Patient aware of results and voiced understanding.  He will go to hospital .

## 2022-10-17 NOTE — Hospital Course (Signed)
57 year old gentleman with sarcoidosis of the lung, osteoarthritis, hyperlipidemia, hypogonadism on testosterone, gout, obstructive uropathy who has been self catheterizing for the last couple years, reports that he has his blood work checked frequently at least a couple times per month and was noted to have a sodium of 125 by his PCP.  This is unusual for him.  His sodium is normally within normal limits.  He does not take diuretics.  He has not been drinking excessive free water or other soft drinks or fluids.  He denies binge drinking alcohol.  Denies recreational drug use.  He was sent to the ED for abnormal labs.  His mentation has been normal.  His sodium was repeated and noted to be 124.  He was given a liter of saline infusion in the ED and admission was requested for management of asymptomatic hyponatremia.

## 2022-10-17 NOTE — ED Notes (Signed)
ED TO INPATIENT HANDOFF REPORT  ED Nurse Name and Phone #: Darlina Guys RN [phone]  S Name/Age/Gender Luke Davila 57 y.o. male Room/Bed: APA02/APA02  Code Status   Code Status: Full Code  Home/SNF/Other Home Patient oriented to: self, place, time, and situation Is this baseline? Yes   Triage Complete: Triage complete  Chief Complaint Hyponatremia [E87.1]  Triage Note Pt sent by Pcp fo Na+ level of 125.    Allergies No Known Allergies  Level of Care/Admitting Diagnosis ED Disposition     ED Disposition  Admit   Condition  --   Comment  Hospital [facility] [[id number]]  Level of Care: Telemetry [5]  Covid Evaluation: Asymptomatic - no recent exposure (last 10 days) testing not required  Diagnosis: Hyponatremia [454098]  Admitting Physician: Cleora Fleet [[id number]]  Attending Physician: Cleora Fleet [[id number]]          B Medical/Surgery History Past Medical History:  Diagnosis Date   Anemia of chronic renal failure, unspecified CKD stage    Arthritis    Avascular necrosis of hip (HCC)    LEFT   Enlarged prostate    Glaucoma    Gout    History of gout    Pneumonia    Past Surgical History:  Procedure Laterality Date   COLONOSCOPY WITH PROPOFOL N/A 11/27/2015   Procedure: COLONOSCOPY WITH PROPOFOL;  Surgeon: West Bali, MD;  [facility];  Service: Endoscopy;  Laterality: N/A;  1100   ENDOBRONCHIAL ULTRASOUND Bilateral 07/17/2021   Procedure: ENDOBRONCHIAL ULTRASOUND;  Surgeon: Chilton Greathouse, MD;  [facility];  Service: Cardiopulmonary;  Laterality: Bilateral;   FINE NEEDLE ASPIRATION  07/17/2021   Procedure: FINE NEEDLE ASPIRATION (FNA) LINEAR;  Surgeon: Chilton Greathouse, MD;  [facility];  Service: Cardiopulmonary;;   FOOT SURGERY Left 01/07/1983   Left hand surgery     TOTAL HIP ARTHROPLASTY Left 04/09/2015   Procedure: LEFT TOTAL HIP ARTHROPLASTY ANTERIOR APPROACH;  Surgeon: Ollen Gross,  MD;  [facility];  Service: Orthopedics;  Laterality: Left;   TRANSURETHRAL RESECTION OF PROSTATE N/A 03/27/2021   Procedure: BIPOLARTRANSURETHRAL RESECTION OF THE PROSTATE (TURP);  Surgeon: Rene Paci, MD;  [facility];  Service: Urology;  Laterality: N/A;   VIDEO BRONCHOSCOPY  07/17/2021   Procedure: VIDEO BRONCHOSCOPY WITHOUT FLUORO;  Surgeon: Chilton Greathouse, MD;  [facility];  Service: Cardiopulmonary;;   WISDOM TOOTH EXTRACTION       A IV Location/Drains/Wounds Patient Lines/Drains/Airways Status     Active Line/Drains/Airways     Name Placement date Placement time Site Days   Peripheral IV 10/17/22 20 G 1" Right Antecubital 10/17/22  1030  Antecubital  less than 1   Urethral Catheter Crystal Latex 10/05/20  1315  Latex  742   Urethral Catheter Dr. Liliane Shi 22 Fr. 03/27/21  1038  --  569            Intake/Output Last 24 hours No intake or output data in the 24 hours ending 10/17/22 1232  Labs/Imaging Results for orders placed or performed during the hospital encounter of 10/17/22 (from the past 48 hour(s))  CBC with Differential     Status: Abnormal   Collection Time: 10/17/22 10:22 AM  Result Value Ref Range   WBC 5.6 4.0 - 10.5 K/uL   RBC 4.33 4.22 - 5.81 MIL/uL   Hemoglobin 11.5 (L) 13.0 - 17.0 g/dL   HCT 11.9 (L) 14.7 - 82.9 %   MCV 78.1 (  L) 80.0 - 100.0 fL   MCH 26.6 26.0 - 34.0 pg   MCHC 34.0 30.0 - 36.0 g/dL   RDW 16.1 09.6 - 04.5 %   Platelets 267 150 - 400 K/uL   nRBC 0.0 0.0 - 0.2 %   Neutrophils Relative % 92 %   Neutro Abs 5.2 1.7 - 7.7 K/uL   Lymphocytes Relative 6 %   Lymphs Abs 0.3 (L) 0.7 - 4.0 K/uL   Monocytes Relative 2 %   Monocytes Absolute 0.1 0.1 - 1.0 K/uL   Eosinophils Relative 0 %   Eosinophils Absolute 0.0 0.0 - 0.5 K/uL   Basophils Relative 0 %   Basophils Absolute 0.0 0.0 - 0.1 K/uL   Immature Granulocytes 0 %   Abs Immature Granulocytes 0.01 0.00 - 0.07 K/uL    Comment: Performed at [facility], [address] [place], [place] [address]  TSH     Status: None   Collection Time: 10/17/22 10:22 AM  Result Value Ref Range   TSH 1.450 0.350 - 4.500 uIU/mL    Comment: Performed by a 3rd Generation assay with a functional sensitivity of <=0.01 uIU/mL. Performed at [facility], [address], [place], [place] [address]   Basic metabolic panel     Status: Abnormal   Collection Time: 10/17/22 10:30 AM  Result Value Ref Range   Sodium 124 (L) 135 - 145 mmol/L   Potassium 3.9 3.5 - 5.1 mmol/L   Chloride 92 (L) 98 - 111 mmol/L   CO2 24 22 - 32 mmol/L   Glucose, Bld 106 (H) 70 - 99 mg/dL    Comment: Glucose reference range applies only to samples taken after fasting for at least 8 hours.   BUN 16 6 - 20 mg/dL   Creatinine, Ser 8.29 0.61 - 1.24 mg/dL   Calcium 8.9 8.9 - 56.2 mg/dL   GFR, Estimated >13 >08 mL/min    Comment: (NOTE) Calculated using the CKD-EPI Creatinine Equation (2021)    Anion gap 8 5 - 15    Comment: Performed at [facility], [address], [place], [place] [address]  Hepatic function panel     Status: Abnormal   Collection Time: 10/17/22 10:30 AM  Result Value Ref Range   Total Protein 7.6 6.5 - 8.1 g/dL   Albumin 4.0 3.5 - 5.0 g/dL   AST 32 15 - 41 U/L   ALT 16 0 - 44 U/L   Alkaline Phosphatase 132 (H) 38 - 126 U/L   Total Bilirubin 1.1 0.3 - 1.2 mg/dL   Bilirubin, Direct 0.1 0.0 - 0.2 mg/dL   Indirect Bilirubin 1.0 (H) 0.3 - 0.9 mg/dL    Comment: Performed at [facility], [address] [place], [place] [address]   No results found.  Pending Labs Unresulted Labs (From admission, onward)     Start     Ordered   10/24/22 0500  Creatinine, serum  (enoxaparin (LOVENOX)    CrCl >/= 30 ml/min)  Weekly,   R     Comments: while on enoxaparin therapy    10/17/22 1230   10/18/22 0500  Cortisol-am, blood  Tomorrow morning,   R        10/17/22 1224   10/18/22 0500  Basic metabolic panel  Tomorrow morning,   R        10/17/22 1230   10/17/22 1228  HIV  Antibody (routine testing w rflx)  (HIV Antibody (Routine testing w reflex) panel)  Once,  R        10/17/22 1230   10/17/22 1228  CBC  (enoxaparin (LOVENOX)    CrCl >/= 30 ml/min)  Once,   R       Comments: Baseline for enoxaparin therapy IF NOT ALREADY DRAWN.  Notify MD if PLT < 100 K.    10/17/22 1230   10/17/22 1228  Creatinine, serum  (enoxaparin (LOVENOX)    CrCl >/= 30 ml/min)  Once,   R       Comments: Baseline for enoxaparin therapy IF NOT ALREADY DRAWN.    10/17/22 1230   10/17/22 1223  Creatinine, urine, random  Add-on,   AD        10/17/22 1223   10/17/22 1217  Rapid urine drug screen (hospital performed)  ONCE - STAT,   STAT        10/17/22 1216   10/17/22 1032  Sodium, urine, random  Once,   URGENT        10/17/22 1031   10/17/22 1032  Osmolality, urine  Once,   URGENT        10/17/22 1031   10/17/22 1022  Cortisol  Once,   R        10/17/22 1022   10/17/22 1022  Osmolality  Once,   R        10/17/22 1022   10/17/22 1022  Uric acid  Once,   R        10/17/22 1022            Vitals/Pain Today's Vitals   10/17/22 1045 10/17/22 1100 10/17/22 1115 10/17/22 1130  BP: 112/88 139/75 117/88 (!) 145/95  Pulse: 66 79 75 71  Resp: (!) 22 (!) 25 17 (!) 21  SpO2: 100% 96% 98% 100%  Weight:      Height:      PainSc:        Isolation Precautions No active isolations  Medications Medications  enoxaparin (LOVENOX) injection 40 mg (has no administration in time range)  0.9 %  sodium chloride infusion (has no administration in time range)  acetaminophen (TYLENOL) tablet 650 mg (has no administration in time range)    Or  acetaminophen (TYLENOL) suppository 650 mg (has no administration in time range)  oxyCODONE (Oxy IR/ROXICODONE) immediate release tablet 5 mg (has no administration in time range)  fentaNYL (SUBLIMAZE) injection 12.5 mcg (has no administration in time range)  traZODone (DESYREL) tablet 25 mg (has no administration in time range)  ondansetron  (ZOFRAN) tablet 4 mg (has no administration in time range)    Or  ondansetron (ZOFRAN) injection 4 mg (has no administration in time range)  sodium chloride 0.9 % bolus 1,000 mL (1,000 mLs Intravenous Bolus from Bag 10/17/22 1042)    Mobility walks     Focused Assessments    R Recommendations: See Admitting Provider Note  Report given to:   Additional Notes:

## 2022-10-18 DIAGNOSIS — N139 Obstructive and reflux uropathy, unspecified: Secondary | ICD-10-CM | POA: Diagnosis not present

## 2022-10-18 DIAGNOSIS — E871 Hypo-osmolality and hyponatremia: Secondary | ICD-10-CM | POA: Diagnosis not present

## 2022-10-18 DIAGNOSIS — D860 Sarcoidosis of lung: Secondary | ICD-10-CM | POA: Diagnosis not present

## 2022-10-18 LAB — BASIC METABOLIC PANEL WITH GFR
Anion gap: 6 (ref 5–15)
BUN: 13 mg/dL (ref 6–20)
CO2: 26 mmol/L (ref 22–32)
Calcium: 8.7 mg/dL — ABNORMAL LOW (ref 8.9–10.3)
Chloride: 102 mmol/L (ref 98–111)
Creatinine, Ser: 0.99 mg/dL (ref 0.61–1.24)
GFR, Estimated: >60 mL/min
Glucose, Bld: 81 mg/dL (ref 70–99)
Potassium: 3.8 mmol/L (ref 3.5–5.1)
Sodium: 134 mmol/L — ABNORMAL LOW (ref 135–145)

## 2022-10-18 LAB — BMP8+EGFR
BUN/Creatinine Ratio: 10 (ref 9–20)
BUN: 10 mg/dL (ref 6–24)
CO2: 23 mmol/L (ref 20–29)
Calcium: 9 mg/dL (ref 8.7–10.2)
Chloride: 88 mmol/L — ABNORMAL LOW (ref 96–106)
Creatinine, Ser: 1.02 mg/dL (ref 0.76–1.27)
Glucose: 74 mg/dL (ref 70–99)
Potassium: 4.4 mmol/L (ref 3.5–5.2)
Sodium: 125 mmol/L — ABNORMAL LOW (ref 134–144)
eGFR: 86 mL/min/{1.73_m2} (ref >59)

## 2022-10-18 LAB — TRIGLYCERIDES: Triglycerides: 49 mg/dL (ref 0–149)

## 2022-10-18 LAB — CORTISOL-AM, BLOOD: Cortisol - AM: 1.7 ug/dL — ABNORMAL LOW (ref 6.7–22.6)

## 2022-10-18 LAB — OSMOLALITY: Osmolality Meas: 248 mosm/kg — ABNORMAL LOW (ref 275–295)

## 2022-10-18 MED ORDER — TAMSULOSIN HCL 0.4 MG PO CAPS: 0.4000 mg | ORAL_CAPSULE | Freq: Every day | ORAL | Status: DC

## 2022-10-18 MED ORDER — PREDNISONE 20 MG PO TABS: 20.0000 mg | ORAL_TABLET | Freq: Every day | ORAL | Status: DC

## 2022-10-18 MED ORDER — ALBUTEROL SULFATE HFA 108 (90 BASE) MCG/ACT IN AERS: 1.0000 | INHALATION_SPRAY | RESPIRATORY_TRACT | 3 refills | Status: DC | PRN

## 2022-10-18 NOTE — Progress Notes (Signed)
   10/18/22 1143  TOC Brief Assessment  Home environment has been reviewed From Home  Prior level of function: Independent  Prior/Current Home Services No current home services  Social Determinants of Health Reivew SDOH reviewed no interventions necessary  Readmission risk has been reviewed Yes  Transition of care needs no transition of care needs at this time     Transition of Care Department [facility]) has reviewed patient and no TOC needs have been identified at this time. We will continue to monitor patient advancement through interdisciplinary progression rounds. If new patient transition needs arise, please place a TOC consult.

## 2022-10-18 NOTE — Progress Notes (Signed)
Pt discharged on prednisone 20 mg daily his home regimen Maryln Manuel MD

## 2022-10-18 NOTE — Discharge Summary (Signed)
Physician Discharge Summary  DARIAN PEPIN ZOX:[id number] DOB: 17-Oct-1965 DOA: 10/17/2022  PCP: Mechele Claude, MD  Admit date: 10/17/2022 Discharge date: 10/18/2022  Admitted From:  Home  Disposition:  Home   Recommendations for Outpatient Follow-up:  Follow up with PCP in 1 weeks Follow up with nephrology as already scheduled Please obtain BMP in one week to monitor sodium Davila  Discharge Condition: STABLE   CODE STATUS: FULL DIET: regular    Brief Hospitalization Summary: Please see all hospital notes, images, labs for full details of the hospitalization. ADMISSION HPI:  57 year old gentleman with sarcoidosis of the lung, osteoarthritis, hyperlipidemia, hypogonadism on testosterone, gout, obstructive uropathy who has been self catheterizing for the last couple years, reports that he has his blood work checked frequently at least a couple times per month and was noted to have a sodium of 125 by his PCP.  This is unusual for him.  His sodium is normally within normal limits.  He does not take diuretics.  He has not been drinking excessive free water or other soft drinks or fluids.  He denies binge drinking alcohol.  Denies recreational drug use.  He was sent to the ED for abnormal labs.  His mentation has been normal.  His sodium was repeated and noted to be 124.  He was given a liter of saline infusion in the ED and admission was requested for management of asymptomatic hyponatremia.  Hospital Course  Patient was sent to the hospital by PCP office after learning that his sodium was down to 125.  He was admitted for observation for treatment and evaluation of acute asymptomatic hyponatremia.  Fortunately responded to IV fluid hydration.  He had serial sodium testing done.  His sodium slowly increased and now is up to 134.  He was never symptomatic from his sodium.  He feels well.  He will discharge home and already has outpatient follow-up with nephrology arranged by PCP and he should  follow-up with PCP as well.  We could not determine the cause for his hyponatremia with some labs still outstanding at time of discharge.  I think he was dehydrated and fortunately he responded to IV fluids.  Discharge Diagnoses:  Principal Problem:   Hyponatremia Active Problems:   Hypogonadism in male   OA (osteoarthritis) of hip   Constipation   PVC's (premature ventricular contractions)   Hyperlipidemia   Primary insomnia   Sarcoidosis of lung (HCC)   BPH (benign prostatic hyperplasia)   Obstructive uropathy   Pulmonary fibrosis (HCC)   Atherosclerosis of aorta (HCC)   Atherosclerosis of right carotid artery   Discharge Instructions:  Allergies as of 10/18/2022   No Known Allergies      Medication List     TAKE these medications    albuterol 108 (90 Base) MCG/ACT inhaler Commonly known as: VENTOLIN HFA Inhale 1-2 puffs into the lungs every 4 (four) hours as needed for wheezing or shortness of breath. What changed: See the new instructions.   ferrous sulfate 324 MG Tbec Take 324 mg by mouth at bedtime.   folic acid 1 MG tablet Commonly known as: FOLVITE Take 1 mg by mouth daily.   methotrexate 2.5 MG tablet Commonly known as: RHEUMATREX Take 8 tablets (20 mg total) by mouth once a week. Caution:Chemotherapy. Protect from light.   prednisoLONE acetate 1 % ophthalmic suspension Commonly known as: PRED FORTE Place 1 drop into both eyes every 2 (two) hours while awake.   predniSONE 20 MG tablet Commonly known as:  DELTASONE Take 1 tablet (20 mg total) by mouth daily with breakfast.   rosuvastatin 10 MG tablet Commonly known as: CRESTOR Take 10 mg by mouth daily.   tamsulosin 0.4 MG Caps capsule Commonly known as: FLOMAX Take 0.4 mg by mouth daily after breakfast.   testosterone cypionate 200 MG/ML injection Commonly known as: DEPOTESTOSTERONE CYPIONATE Inject 1 mL (200 mg total) into the muscle every 14 (fourteen) days. INJECT 1 ML (200 MG TOTAL) INTO  THE MUSCLE EVERY 14 DAYS        Follow-up Information     Stacks, Broadus John, MD. Schedule an appointment as soon as possible for a visit in 1 week(s).   Specialty: Family Medicine Why: Hospital Follow Up Contact information: [address] [place] [address] [phone]         Luke Level, MD Follow up.   Specialty: Internal Medicine Why: Hospital Follow Up Contact information: [address] [place] [address] [phone]                No Known Allergies Allergies as of 10/18/2022   No Known Allergies      Medication List     TAKE these medications    albuterol 108 (90 Base) MCG/ACT inhaler Commonly known as: VENTOLIN HFA Inhale 1-2 puffs into the lungs every 4 (four) hours as needed for wheezing or shortness of breath. What changed: See the new instructions.   ferrous sulfate 324 MG Tbec Take 324 mg by mouth at bedtime.   folic acid 1 MG tablet Commonly known as: FOLVITE Take 1 mg by mouth daily.   methotrexate 2.5 MG tablet Commonly known as: RHEUMATREX Take 8 tablets (20 mg total) by mouth once a week. Caution:Chemotherapy. Protect from light.   prednisoLONE acetate 1 % ophthalmic suspension Commonly known as: PRED FORTE Place 1 drop into both eyes every 2 (two) hours while awake.   predniSONE 20 MG tablet Commonly known as: DELTASONE Take 1 tablet (20 mg total) by mouth daily with breakfast.   rosuvastatin 10 MG tablet Commonly known as: CRESTOR Take 10 mg by mouth daily.   tamsulosin 0.4 MG Caps capsule Commonly known as: FLOMAX Take 0.4 mg by mouth daily after breakfast.   testosterone cypionate 200 MG/ML injection Commonly known as: DEPOTESTOSTERONE CYPIONATE Inject 1 mL (200 mg total) into the muscle every 14 (fourteen) days. INJECT 1 ML (200 MG TOTAL) INTO THE MUSCLE EVERY 14 DAYS        Procedures/Studies: No results found.   Subjective: Patient says he feels well and he is eager to get back home.  He says that  his PCP made an appointment for him to see nephrologist next week.  He will see Dr. Melanee Spry.  Discharge Exam: Vitals:   10/18/22 0020 10/18/22 0438  BP: 122/63 115/69  Pulse: 88 76  Resp: (!) 22 18  Temp: 97.7 F (36.5 C) 98 F (36.7 C)  SpO2: 100% 100%   Vitals:   10/17/22 2015 10/17/22 2322 10/18/22 0020 10/18/22 0438  BP: 137/79  122/63 115/69  Pulse: 78  88 76  Resp: (!) 22  (!) 22 18  Temp: 98 F (36.7 C)  97.7 F (36.5 C) 98 F (36.7 C)  TempSrc: Oral   Oral  SpO2: 100% 99% 100% 100%  Weight:    90.5 kg  Height:       General: Pt is alert, awake, not in acute distress Cardiovascular: RRR, S1/S2 +, no rubs, no gallops Respiratory: CTA bilaterally,  no wheezing, no rhonchi Abdominal: Soft, NT, ND, bowel sounds + Extremities: no edema, no cyanosis   The results of significant diagnostics from this hospitalization (including imaging, microbiology, ancillary and laboratory) are listed below for reference.     Microbiology: Recent Results (from the past 240 hour(s))  Urine Culture     Status: None   Collection Time: 10/15/22  3:48 PM   Specimen: Urine   UR  Result Value Ref Range Status   Urine Culture, Routine Final report  Final   Organism ID, Bacteria Comment  Final    Comment: Mixed urogenital flora 10,000-25,000 colony forming units per mL      Labs: BNP (last 3 results) No results for input(s): "BNP" in the last 8760 hours. Basic Metabolic Panel: Recent Labs  Lab 10/15/22 1410 10/16/22 1425 10/17/22 1030 10/17/22 1301 10/17/22 1442 10/18/22 0424  NA 125* 125* 124* 129* 127* 134*  K 4.7 4.4 3.9  --   --  3.8  CL 90* 88* 92*  --   --  102  CO2 23 23 24   --   --  26  GLUCOSE 73 74 106*  --   --  81  BUN 10 10 16   --   --  13  CREATININE 0.95 1.02 0.94  --   --  0.99  CALCIUM 8.7 9.0 8.9  --   --  8.7*   Liver Function Tests: Recent Labs  Lab 10/17/22 1030  AST 32  ALT 16  ALKPHOS 132*  BILITOT 1.1  PROT 7.6  ALBUMIN 4.0   No results  for input(s): "LIPASE", "AMYLASE" in the last 168 hours. No results for input(s): "AMMONIA" in the last 168 hours. CBC: Recent Labs  Lab 10/17/22 1022  WBC 5.6  NEUTROABS 5.2  HGB 11.5*  HCT 33.8*  MCV 78.1*  PLT 267   Cardiac Enzymes: No results for input(s): "CKTOTAL", "CKMB", "CKMBINDEX", "TROPONINI" in the last 168 hours. BNP: Invalid input(s): "POCBNP" CBG: No results for input(s): "GLUCAP" in the last 168 hours. D-Dimer No results for input(s): "DDIMER" in the last 72 hours. Hgb A1c No results for input(s): "HGBA1C" in the last 72 hours. Lipid Profile Recent Labs    10/16/22 1425  TRIG 49   Thyroid function studies Recent Labs    10/17/22 1022  TSH 1.450   Anemia work up No results for input(s): "VITAMINB12", "FOLATE", "FERRITIN", "TIBC", "IRON", "RETICCTPCT" in the last 72 hours. Urinalysis    Component Value Date/Time   COLORURINE STRAW (A) 09/27/2021 1740   APPEARANCEUR Clear 10/15/2022 1548   LABSPEC 1.010 09/27/2021 1740   PHURINE 6.0 09/27/2021 1740   GLUCOSEU Negative 10/15/2022 1548   HGBUR MODERATE (A) 09/27/2021 1740   BILIRUBINUR Negative 10/15/2022 1548   KETONESUR NEGATIVE 09/27/2021 1740   PROTEINUR Negative 10/15/2022 1548   PROTEINUR NEGATIVE 09/27/2021 1740   UROBILINOGEN negative 12/15/2012 1806   NITRITE Negative 10/15/2022 1548   NITRITE NEGATIVE 09/27/2021 1740   LEUKOCYTESUR Negative 10/15/2022 1548   LEUKOCYTESUR NEGATIVE 09/27/2021 1740   Sepsis Labs Recent Labs  Lab 10/17/22 1022  WBC 5.6   Microbiology Recent Results (from the past 240 hour(s))  Urine Culture     Status: None   Collection Time: 10/15/22  3:48 PM   Specimen: Urine   UR  Result Value Ref Range Status   Urine Culture, Routine Final report  Final   Organism ID, Bacteria Comment  Final    Comment: Mixed urogenital flora 10,000-25,000 colony forming units  per mL    Time coordinating discharge: 42 mins  SIGNED:  Standley Dakins, MD  [facility] 10/18/2022, 10:38 AM How to contact the [facility] Attending or Consulting provider 7A - 7P or covering provider during after hours 7P -7A, for this patient?  Check the care team in [facility] and look for a) attending/consulting [facility] provider listed and b) the [facility] team listed Log into [email or web address] and use Joanna's universal password to access. If you do not have the password, please contact the hospital operator. Locate the [facility] provider you are looking for under [facility] and page to a number that you can be directly reached. If you still have difficulty reaching the provider, please page the [facility] (Director on Call) for the Hospitalists listed on amion for assistance.

## 2022-10-20 ENCOUNTER — Telehealth: Payer: Self-pay

## 2022-10-20 NOTE — Transitions of Care (Post Inpatient/ED Visit) (Unsigned)
10/20/2022  Name: Luke Davila MRN: [id number] DOB: 04-24-1965  Today's TOC FU Call Status: Today's TOC FU Call Status:: Successful TOC FU Call Completed TOC FU Call Complete Date: 10/20/22 Patient's Name and Date of Birth confirmed.  Transition Care Management Follow-up Telephone Call Date of Discharge: 10/18/22 [facility] Type of Discharge: Inpatient Admission Primary Inpatient Discharge Diagnosis:: Hyponatremia How have you been since you were released from the hospital?: Better Any questions or concerns?: No  Items Reviewed: Did you receive and understand the discharge instructions provided?: Yes Medications obtained,verified, and reconciled?: Yes (Medications Reviewed) Any new allergies since your discharge?: No Dietary orders reviewed?: NA Do you have support at home?: Yes People in Home: spouse  Medications Reviewed Today: Medications Reviewed Today     Reviewed by Anthoney Harada, LPN (Licensed Practical Nurse) on 10/20/22 at 1149  Med List Status: <None>   Medication Order Taking? Sig Documenting Provider Last Dose Status Informant  albuterol (VENTOLIN HFA) 108 (90 Base) MCG/ACT inhaler [id number] Yes Inhale 1-2 puffs into the lungs every 4 (four) hours as needed for wheezing or shortness of breath. Cleora Fleet, MD Taking Active   ferrous sulfate 324 MG TBEC [id number] Yes Take 324 mg by mouth at bedtime. [provider] Taking Active Spouse/Significant Other, Self  folic acid (FOLVITE) 1 MG tablet [id number] Yes Take 1 mg by mouth daily. [provider] Taking Active Spouse/Significant Other, Self  methotrexate (RHEUMATREX) 2.5 MG tablet [id number] Yes Take 8 tablets (20 mg total) by mouth once a week. Caution:Chemotherapy. Protect from light. Chilton Greathouse, MD Taking Active Spouse/Significant Other, Self  prednisoLONE acetate (PRED FORTE) 1 % ophthalmic suspension [id number] Yes Place 1 drop into both eyes every 2  (two) hours while awake. [provider] Taking Active Spouse/Significant Other, Self  predniSONE (DELTASONE) 20 MG tablet [id number] Yes Take 1 tablet (20 mg total) by mouth daily with breakfast. Mechele Claude, MD Taking Active Spouse/Significant Other, Self  rosuvastatin (CRESTOR) 10 MG tablet [id number] Yes Take 10 mg by mouth daily. [provider] Taking Active Spouse/Significant Other, Self  tamsulosin (FLOMAX) 0.4 MG CAPS capsule [id number] Yes Take 0.4 mg by mouth daily after breakfast. [provider] Taking Active Spouse/Significant Other, Self  testosterone cypionate (DEPOTESTOSTERONE CYPIONATE) 200 MG/ML injection [id number] Yes Inject 1 mL (200 mg total) into the muscle every 14 (fourteen) days. INJECT 1 ML (200 MG TOTAL) INTO THE MUSCLE EVERY 14 DAYS Mechele Claude, MD Taking Active Spouse/Significant Other, Self           Med Note (WARD, Illene Labrador   Fri Oct 17, 2022  2:01 PM) Pt and pt's spouse was unsure about time, said maybe Monday-Wednesday of this week.  testosterone cypionate (DEPOTESTOSTERONE CYPIONATE) injection 200 mg [id number]   Mechele Claude, MD  Active   testosterone cypionate (DEPOTESTOSTERONE CYPIONATE) injection 200 mg [id number]   Mechele Claude, MD  Active             Home Care and Equipment/Supplies: Were Home Health Services Ordered?: No Any new equipment or medical supplies ordered?: No  Functional Questionnaire: Do you need assistance with bathing/showering or dressing?: No Do you need assistance with meal preparation?: No Do you need assistance with eating?: No Do you have difficulty maintaining continence: No Do you need assistance with getting out of bed/getting out of a chair/moving?: No Do you have difficulty managing or taking your medications?: No  Follow up appointments reviewed: PCP Follow-up appointment confirmed?: Yes Date of PCP  follow-up appointment?: 10/27/22 Follow-up Provider: Dr. Earline Mayotte  Follow-up appointment confirmed?: Yes Follow-Up Specialty Provider:: Dr. Valentino Nose Do you need transportation to your follow-up appointment?: No Do you understand care options if your condition(s) worsen?: Yes-patient verbalized understanding    SIGNATURE Kandis Fantasia, LPN [facility] Advisor [place] [facility] You Are. We Are. [facility] Direct Dial [phone]

## 2022-10-24 NOTE — Progress Notes (Unsigned)
[facility] Clinic Note - Initial Visit   Date: 10/27/2022   Luke Davila MRN: [id number] DOB: 1965/09/03   Dear Dr. Darlyn Read:  Thank you for your kind referral of Luke Davila for consultation of gait abnormality. Although his history is well known to you, please allow [place] to reiterate it for the purpose of our medical record. The patient was accompanied to the clinic by ex-wife who also provides collateral information.     Luke Davila is a 57 y.o. right-handed male with history of right optic neuritis (2015), hyperlipidemia, BPH, and sarcoidosis (on methotrexate) presenting for evaluation of gait abnormality.   IMPRESSION/PLAN: Gait abnormality due to bilateral leg spasticity and knee flexor contractures.  Etiology of spasticity is unclear. Prior imaging of the cervical and lumbar spine did not show cord pathology.  I will check labs for myelopathy as well as imaging the brain and thoracic spine.  Gadolinium is not used due to CKD, if there are abnormalities, we will need to check with his nephrologist for contrasted imaging.  - Check vitamin B12, folate, vitamin B1, vitamin E, zinc, copper, SPEP with IFE, ceruloplasmin, and GAD65 - MRI brain and thoracic spine wo contrast - Start baclofen 5mg  BID with instructions to titrate to 10mg  BID - Continue PT for leg stretching  Return to clinic in 4 months  ------------------------------------------------------------- History of present illness: Starting around ~2018, he began having difficulty with walking, described has leg heaviness and stiffness.  Since then, he has progressive gait difficulty, especially with stretching the legs or straightening them out.  He tends to walk with his knees bent.  He has fallen a few times, none this year.  He has completed PT which transiently helps.  He denies weakness and walks unassisted.  He sometimes gets cramps in the lower legs.   Previously, he was evaluated at  [facility] for right eye blindness in 2015 which was found to be optic neuritis.  Imaging of the brain and cervical spine was unrevealing.  He has seen orthooeadics and found to have left avascular necrosis of the hip for which he has had surgery.   More recently in April 2024, he had MRI cervical spine which showed foraminal stenosis at C3-4 and C4-5, no cord pathology.  He was referred for further evaluation.    Out-side paper records, electronic medical record, and images have been reviewed where available and summarized as:  MRI cervical spine wwo contrast 06/23/2013:  Mildly abnormal MRI cervical spine (with and without) demonstrating: 1. At C3-4: disc bulging and uncovertebral joint hypertrophy with mild biforaminal stenosis. 2. At C4-5: disc bulging and uncovertebral joint hypertrophy with severe right and moderate-severe left foraminal stenosis. 3. No intrinsic, compressive or abnormal enhancing spinal cord lesions.   MRI left hip wo contrast 02/22/2015:  1. Avascular necrosis of the femoral heads, chronic and re- vascularized on the right but active on the left, with early subcortical fracture/collapse in the involved portion of the left femoral head, and surrounding marrow edema as well as a hip joint effusion. 2. There is some likely secondary inflammation of adjacent muscles including the hip adductor musculature, iliopsoas, and proximal quadriceps musculature. 3. Trace free pelvic fluid, etiology and significance uncertain.  MRI brain and orbit wwo contrast 04/22/2013:  Normal  MRI cervical spine 08/20/2022: Severe right foraminal stenosis and mild canal stenosis at C3-C4 and C4-C5.  MRI lumbar spine 02/02/2019: 1. Mild degenerative disc disease at L4-5 and L5-S1 with slight left foraminal narrowing  at L4-5 and L5-S1. 2. No other significant abnormalities.   Lab Results  Component Value Date   HGBA1C 5.2 09/01/2022   Lab Results  Component Value Date   VITAMINB12 584  10/27/2022   Lab Results  Component Value Date   TSH 1.450 10/17/2022   Lab Results  Component Value Date   ESRSEDRATE 31 (H) 09/17/2022    Past Medical History:  Diagnosis Date   Anemia of chronic renal failure, unspecified CKD stage    Arthritis    Avascular necrosis of hip (HCC)    LEFT   Enlarged prostate    Glaucoma    Gout    History of gout    Pneumonia     Past Surgical History:  Procedure Laterality Date   COLONOSCOPY WITH PROPOFOL N/A 11/27/2015   Procedure: COLONOSCOPY WITH PROPOFOL;  Surgeon: West Bali, MD;  Location: [facility];  Service: Endoscopy;  Laterality: N/A;  1100   ENDOBRONCHIAL ULTRASOUND Bilateral 07/17/2021   Procedure: ENDOBRONCHIAL ULTRASOUND;  Surgeon: Chilton Greathouse, MD;  Location: [facility];  Service: Cardiopulmonary;  Laterality: Bilateral;   FINE NEEDLE ASPIRATION  07/17/2021   Procedure: FINE NEEDLE ASPIRATION (FNA) LINEAR;  Surgeon: Chilton Greathouse, MD;  Location: [facility];  Service: Cardiopulmonary;;   FOOT SURGERY Left 01/07/1983   Left hand surgery     TOTAL HIP ARTHROPLASTY Left 04/09/2015   Procedure: LEFT TOTAL HIP ARTHROPLASTY ANTERIOR APPROACH;  Surgeon: Ollen Gross, MD;  Location: [facility];  Service: Orthopedics;  Laterality: Left;   TRANSURETHRAL RESECTION OF PROSTATE N/A 03/27/2021   Procedure: BIPOLARTRANSURETHRAL RESECTION OF THE PROSTATE (TURP);  Surgeon: Rene Paci, MD;  Location: [facility];  Service: Urology;  Laterality: N/A;   VIDEO BRONCHOSCOPY  07/17/2021   Procedure: VIDEO BRONCHOSCOPY WITHOUT FLUORO;  Surgeon: Chilton Greathouse, MD;  Location: [facility];  Service: Cardiopulmonary;;   WISDOM TOOTH EXTRACTION       Medications:  Outpatient Encounter Medications as of 10/27/2022  Medication Sig Note   albuterol (VENTOLIN HFA) 108 (90 Base) MCG/ACT inhaler Inhale 1-2 puffs into the lungs every 4 (four) hours as needed for wheezing or shortness of breath.    baclofen (LIORESAL) 10 MG tablet  Start half tablet twice daily x 2 weeks, then increase to half tablet in AM and 1 tablet at bedtime x 2 weeks, then 1 tablet twice daily.    ferrous sulfate 324 MG TBEC Take 324 mg by mouth at bedtime.    folic acid (FOLVITE) 1 MG tablet Take 1 mg by mouth daily.    methotrexate (RHEUMATREX) 2.5 MG tablet Take 8 tablets (20 mg total) by mouth once a week. Caution:Chemotherapy. Protect from light.    prednisoLONE acetate (PRED FORTE) 1 % ophthalmic suspension Place 1 drop into both eyes every 2 (two) hours while awake.    predniSONE (DELTASONE) 20 MG tablet Take 1 tablet (20 mg total) by mouth daily with breakfast. (Patient taking differently: Take 20 mg by mouth daily with breakfast. As needed)    rosuvastatin (CRESTOR) 10 MG tablet Take 10 mg by mouth daily.    tamsulosin (FLOMAX) 0.4 MG CAPS capsule Take 0.4 mg by mouth daily after breakfast.    testosterone cypionate (DEPOTESTOSTERONE CYPIONATE) 200 MG/ML injection Inject 1 mL (200 mg total) into the muscle every 14 (fourteen) days. INJECT 1 ML (200 MG TOTAL) INTO THE MUSCLE EVERY 14 DAYS 10/17/2022: Pt and pt's spouse was unsure about time, said maybe Monday-Wednesday of this week.   Facility-Administered Encounter Medications  as of 10/27/2022  Medication   testosterone cypionate (DEPOTESTOSTERONE CYPIONATE) injection 200 mg   testosterone cypionate (DEPOTESTOSTERONE CYPIONATE) injection 200 mg    Allergies: No Known Allergies  Family History: Family History  Problem Relation Age of Onset   Hypertension Mother    Colon cancer Neg Hx     Social History: Social History   Tobacco Use   Smoking status: Never   Smokeless tobacco: Never  Vaping Use   Vaping status: Never Used  Substance Use Topics   Alcohol use: Yes    Comment: beer on occasion   Drug use: No   Social History   Social History Narrative   Patient lives at home with his family.Caffeine Use: 1 soda dailyPatient is right handed      Are you right handed or left  handed? Right Handed   Are you currently employed ? 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 Yes   Who lives with you?    What type of home do you live in: 1 story or 2 story? Lives in one story home        Vital Signs:  BP 111/72   Pulse 83   Ht 6\' 3"  (1.905 m)   Wt 204 lb (92.5 kg)   SpO2 100%   BMI 25.50 kg/m   Neurological Exam: MENTAL STATUS including orientation to time, place, person, recent and remote memory, attention span and concentration, language, and fund of knowledge is normal.  Speech is not dysarthric.  CRANIAL NERVES: II:  Right eye is blind. No visual field defects in left eye.     III-IV-VI:  Relative afferent pupillary defect on the right.  Pupil equal round and reactive to light on the left.  Normal conjugate, extra-ocular eye movements in all directions of gaze.  No nystagmus.  No ptosis.   V:  Normal facial sensation.    VII:  Normal facial symmetry and movements.   VIII:  Normal hearing and vestibular function.   IX-X:  Normal palatal movement.   XI:  Normal shoulder shrug and head rotation.   XII:  Normal tongue strength and range of motion, no deviation or fasciculation.  MOTOR:  No atrophy, fasciculations or abnormal movements.  No pronator drift.   Upper Extremity:  Right  Left  Deltoid  5/5   5/5   Biceps  5/5   5/5   Triceps  5/5   5/5   Wrist extensors  5/5   5/5   Wrist flexors  5/5   5/5   Finger extensors  5/5   5/5   Finger flexors  5/5   5/5   Dorsal interossei  5/5   5/5   Abductor pollicis  5/5   5/5   Tone (Ashworth scale)  0  0   Lower Extremity:  Right  Left  Hip flexors  5/5   5/5   Knee flexors  5/5   5/5   Knee extensors  5/5   5/5   Dorsiflexors  5/5   5/5   Plantarflexors  5/5   5/5   Toe extensors  5/5   5/5   Toe flexors  5/5   5/5   Tone (Ashworth scale)  1  1   MSRs:  Right        Left brachioradialis 2+  2+  biceps 2+  2+   triceps 2+  2+  patellar 3+  3+  ankle jerk 2+  2+  Hoffman no  no  plantar response up  up    SENSORY: Vibration mildly reduced at the great toe bilaterally, otherwise, normal and symmetric perception of light touch, pinprick, and vibration.  Romberg's sign absent.   COORDINATION/GAIT: Normal finger-to- nose-finger.  Finger tapping intact.  Heel tapping is slowed.  He struggles to get out of the chair and pushes off with hands.  Gait is flexed at the knees and slightly stopped, mildly wide-based.      Thank you for allowing me to participate in patient's care.  If I can answer any additional questions, I would be pleased to do so.    Sincerely,    Brin Ruggerio K. Allena Katz, DO

## 2022-10-27 ENCOUNTER — Other Ambulatory Visit (INDEPENDENT_AMBULATORY_CARE_PROVIDER_SITE_OTHER): Payer: 59

## 2022-10-27 ENCOUNTER — Other Ambulatory Visit: Payer: Self-pay | Admitting: Family Medicine

## 2022-10-27 ENCOUNTER — Encounter: Payer: Self-pay | Admitting: Neurology

## 2022-10-27 ENCOUNTER — Ambulatory Visit: Payer: 59 | Admitting: Family Medicine

## 2022-10-27 ENCOUNTER — Ambulatory Visit: Payer: 59 | Admitting: Neurology

## 2022-10-27 VITALS — BP 111/72 | HR 83 | Ht 75.0 in | Wt 204.0 lb

## 2022-10-27 DIAGNOSIS — M62838 Other muscle spasm: Secondary | ICD-10-CM

## 2022-10-27 DIAGNOSIS — R292 Abnormal reflex: Secondary | ICD-10-CM

## 2022-10-27 DIAGNOSIS — R261 Paralytic gait: Secondary | ICD-10-CM

## 2022-10-27 DIAGNOSIS — R202 Paresthesia of skin: Secondary | ICD-10-CM

## 2022-10-27 LAB — B12 AND FOLATE PANEL
Folate: >24.2 ng/mL (ref >5.9)
Vitamin B-12: 584 pg/mL (ref 211–911)

## 2022-10-27 MED ORDER — BACLOFEN 10 MG PO TABS
ORAL_TABLET | ORAL | 5 refills | Status: DC
Start: 2022-10-27 — End: 2023-03-27

## 2022-10-27 NOTE — Patient Instructions (Addendum)
MRI brain and thoracic spine wo contrast  Check labs  Start baclofen 10mg  tablet - Start half tablet twice daily x 2 weeks, then increase to half tablet in AM and 1 tablet at bedtime x 2 weeks, then 1 tablet twice daily.

## 2022-10-29 ENCOUNTER — Other Ambulatory Visit: Payer: Self-pay | Admitting: *Deleted

## 2022-10-29 ENCOUNTER — Ambulatory Visit (INDEPENDENT_AMBULATORY_CARE_PROVIDER_SITE_OTHER): Payer: 59 | Admitting: *Deleted

## 2022-10-29 ENCOUNTER — Other Ambulatory Visit: Payer: 59

## 2022-10-29 DIAGNOSIS — N179 Acute kidney failure, unspecified: Secondary | ICD-10-CM

## 2022-10-29 DIAGNOSIS — E291 Testicular hypofunction: Secondary | ICD-10-CM

## 2022-10-29 NOTE — Progress Notes (Signed)
Pt given testosterone injection IM left glut and tolerated well.

## 2022-10-30 ENCOUNTER — Encounter: Payer: Self-pay | Admitting: Family Medicine

## 2022-10-30 LAB — BMP8+EGFR
BUN/Creatinine Ratio: 9 (ref 9–20)
BUN: 9 mg/dL (ref 6–24)
CO2: 24 mmol/L (ref 20–29)
Calcium: 8.8 mg/dL (ref 8.7–10.2)
Chloride: 96 mmol/L (ref 96–106)
Creatinine, Ser: 1.03 mg/dL (ref 0.76–1.27)
Glucose: 73 mg/dL (ref 70–99)
Potassium: 3.8 mmol/L (ref 3.5–5.2)
Sodium: 132 mmol/L — ABNORMAL LOW (ref 134–144)
eGFR: 85 mL/min/{1.73_m2} (ref >59)

## 2022-10-31 ENCOUNTER — Telehealth: Payer: Self-pay | Admitting: Neurology

## 2022-10-31 ENCOUNTER — Telehealth: Payer: Self-pay | Admitting: Family Medicine

## 2022-10-31 NOTE — Telephone Encounter (Signed)
Pt aware of provider feedback and voiced understanding. 

## 2022-10-31 NOTE — Telephone Encounter (Signed)
Called pt and informed him of Dr. Everlena Cooper answer. He understood and was thankful.

## 2022-10-31 NOTE — Telephone Encounter (Signed)
Yes he is okay to take muscle relaxers and prednisone together, they do not interact.

## 2022-10-31 NOTE — Telephone Encounter (Signed)
Pt called wanting to know if it is okay to take Prednisone while taking a 5mg  muscle relaxer?  Can covering provider advise on this?

## 2022-10-31 NOTE — Telephone Encounter (Signed)
Caller stated he's wanting to know if it's okay to take Prednisone while taking a 5mg  muscle relaxer?

## 2022-11-02 ENCOUNTER — Other Ambulatory Visit: Payer: 59

## 2022-11-04 ENCOUNTER — Encounter (HOSPITAL_COMMUNITY): Payer: Self-pay

## 2022-11-04 ENCOUNTER — Encounter: Payer: Self-pay | Admitting: Family

## 2022-11-04 ENCOUNTER — Ambulatory Visit: Payer: 59 | Admitting: Family

## 2022-11-04 VITALS — BP 132/78 | HR 77 | Temp 98.7°F | Ht 75.0 in | Wt 202.0 lb

## 2022-11-04 DIAGNOSIS — J8410 Pulmonary fibrosis, unspecified: Secondary | ICD-10-CM | POA: Diagnosis not present

## 2022-11-04 DIAGNOSIS — Z09 Encounter for follow-up examination after completed treatment for conditions other than malignant neoplasm: Secondary | ICD-10-CM | POA: Diagnosis not present

## 2022-11-04 DIAGNOSIS — D860 Sarcoidosis of lung: Secondary | ICD-10-CM

## 2022-11-04 DIAGNOSIS — E871 Hypo-osmolality and hyponatremia: Secondary | ICD-10-CM | POA: Diagnosis not present

## 2022-11-04 LAB — PROTEIN ELECTROPHORESIS, SERUM
Albumin ELP: 3.9 g/dL (ref 3.8–4.8)
Alpha 1: 0.3 g/dL (ref 0.2–0.3)
Alpha 2: 0.8 g/dL (ref 0.5–0.9)
Beta 2: 0.4 g/dL (ref 0.2–0.5)
Beta Globulin: 0.4 g/dL (ref 0.4–0.6)
Gamma Globulin: 1 g/dL (ref 0.8–1.7)
Total Protein: 6.8 g/dL (ref 6.1–8.1)

## 2022-11-04 LAB — IMMUNOFIXATION ELECTROPHORESIS
IgG (Immunoglobin G), Serum: 1281 mg/dL (ref 600–1640)
IgM, Serum: 32 mg/dL — ABNORMAL LOW (ref 50–300)
Immunoglobulin A: 152 mg/dL (ref 47–310)

## 2022-11-04 LAB — VITAMIN E
Gamma-Tocopherol (Vit E): 1.5 mg/L (ref <4.4)
Vitamin E (Alpha Tocopherol): 12 mg/L (ref 5.7–19.9)

## 2022-11-04 LAB — VITAMIN B1: Vitamin B1 (Thiamine): 10 nmol/L (ref 8–30)

## 2022-11-04 LAB — GAD65, IA-2, AND INSULIN AUTOANTIBODY SERUM
Glutamic Acid Decarb Ab: <5 [IU]/mL (ref <5)
IA-2 Antibody: <5.4 U/mL (ref <5.4)
Insulin Antibodies, Human: <0.4 U/mL (ref <0.4)

## 2022-11-04 LAB — ZINC: Zinc: 64 ug/dL (ref 60–130)

## 2022-11-04 LAB — CERULOPLASMIN: Ceruloplasmin: 38 mg/dL — ABNORMAL HIGH (ref 14–30)

## 2022-11-04 LAB — COPPER, SERUM: Copper: 174 ug/dL (ref 70–175)

## 2022-11-04 NOTE — Patient Instructions (Signed)
Hyponatremia Hyponatremia is when the amount of salt (sodium) in a person's blood is too low. When sodium levels are low, the cells absorb extra water, which causes them to swell. The swelling happens throughout the body, but it mostly affects the brain. What are the causes? This condition may be caused by: Certain medical conditions, such as: Heart, kidney, or liver problems. Thyroid problems. Adrenal gland problems. Metabolic conditions, such as Addison's disease or syndrome of inappropriate antidiuresis (SIAD). Excessive vomiting, diarrhea, or sweating. Certain medicines or illegal drugs. Fluids given through an IV. What increases the risk? You are more likely to develop this condition if you: Have certain medical conditions such as heart, kidney, or liver failure. Have a medical condition that causes frequent or excessive diarrhea. Participate in intense physical activities, such as marathon running. Take certain medicines that affect the sodium and fluid balance in the blood. Some of these medicine types include: Diuretics. NSAIDs, such as ibuprofen. Some opioid pain medicines. Some antidepressants. Some seizure prevention medicines. What are the signs or symptoms? Symptoms of this condition include: Headache. Nausea and vomiting. Being very tired (lethargic). Muscle weakness and cramping. Loss of appetite. Feeling weak or light-headed. Severe symptoms of this condition include: Confusion. Agitation. Having a rapid heart rate. Fainting. Seizures. Coma. How is this diagnosed? This condition is diagnosed based on: A physical exam. Your medical history. Tests, including: Blood tests. Urine tests. How is this treated? Treatment for this condition depends on the cause. Treatment may include: Getting fluids through an IV that is inserted into one of your veins. Medicines to correct the sodium imbalance. If medicines are causing the condition, the medicines will need to  be adjusted. Limiting your water or fluid intake to get the correct sodium balance, in certain cases. Monitoring in the hospital to closely watch your symptoms for improvement. Follow these instructions at home:  Take over-the-counter and prescription medicines only as told by your health care provider. Many medicines can make this condition worse. Talk with your health care provider about any medicines that you are currently taking. Do not drink alcohol. Keep all follow-up visits. This is important. Contact a health care provider if: You develop worsening nausea, fatigue, headache, confusion, or weakness. Your symptoms go away and then return. Get help right away if: You have a seizure. You faint. You have ongoing diarrhea or vomiting. Summary Hyponatremia is when the amount of salt (sodium) in your blood is too low. When sodium levels are low, your cells absorb extra water, which causes them to swell. The swelling happens throughout the body, but it mostly affects the brain. Treatment for this condition depends on the cause. It may include receiving IV fluids, taking or adjusting medicines, limiting fluid intake, and monitoring in the hospital. This information is not intended to replace advice given to you by your health care provider. Make sure you discuss any questions you have with your health care provider. Document Revised: 07/03/2020 Document Reviewed: 07/03/2020 Elsevier Patient Education  2024 [employer].

## 2022-11-04 NOTE — Progress Notes (Signed)
Subjective:    Patient ID: Luke Davila, male    DOB: December 22, 1965, 57 y.o.   MRN: [id number]  Chief Complaint  Patient presents with   Hospitalization Follow-up    HPI PT presents to the office today for hospital follow up. He went to the ED on 10/17/22 with hyponatremia. He was given a liter of saline infusion and admitted for asymptomatic hyponatremia. He was discharged 10/18/22.   Denies any confusion.   He had a repeat BMP on 10/29/22 and his Sodium was 132.   He is followed by Pulmonologist for pulmonary fibrosis and sarcoidosis. He takes prednisone as needed.    Review of Systems  Respiratory:  Positive for shortness of breath and wheezing.   All other systems reviewed and are negative.      Objective:   Physical Exam Vitals reviewed.  Constitutional:      General: He is not in acute distress.    Appearance: He is well-developed.  HENT:     Head: Normocephalic.     Right Ear: External ear normal.     Left Ear: External ear normal.  Eyes:     General:        Right eye: No discharge.        Left eye: No discharge.     Pupils: Pupils are equal, round, and reactive to light.  Neck:     Thyroid: No thyromegaly.  Cardiovascular:     Rate and Rhythm: Normal rate and regular rhythm.     Heart sounds: Normal heart sounds. No murmur heard. Pulmonary:     Effort: Pulmonary effort is normal. No respiratory distress.     Breath sounds: Wheezing present.  Abdominal:     General: Bowel sounds are normal. There is no distension.     Palpations: Abdomen is soft.     Tenderness: There is no abdominal tenderness.  Musculoskeletal:        General: No tenderness. Normal range of motion.     Cervical back: Normal range of motion and neck supple.  Skin:    General: Skin is warm and dry.     Findings: No erythema or rash.  Neurological:     Mental Status: He is alert and oriented to person, place, and time.     Cranial Nerves: No cranial nerve deficit.     Deep Tendon  Reflexes: Reflexes are normal and symmetric.  Psychiatric:        Behavior: Behavior normal.        Thought Content: Thought content normal.        Judgment: Judgment normal.       BP 132/78   Pulse 77   Temp 98.7 F (37.1 C) (Temporal)   Ht 6\' 3"  (1.905 m)   Wt 202 lb (91.6 kg)   SpO2 97%   BMI 25.25 kg/m      Assessment & Plan:  DARRAGH GRUENWALD comes in today with chief complaint of Hospitalization Follow-up   Diagnosis and orders addressed:  1. Hyponatremia - CBC with Differential/Platelet - CMP14+EGFR  2. Hospital discharge follow-up - CBC with Differential/Platelet - CMP14+EGFR  3. Sarcoidosis of lung (HCC) - CBC with Differential/Platelet - CMP14+EGFR  4. Pulmonary fibrosis (HCC) - CBC with Differential/Platelet - CMP14+EGFR   Labs pending Force fluids  Continue prednisone, mucinex, and albuterol  Keep follow up with PCP  Jannifer Rodney, FNP

## 2022-11-05 LAB — CMP14+EGFR
ALT: 14 [IU]/L (ref 0–44)
AST: 24 [IU]/L (ref 0–40)
Albumin: 4.2 g/dL (ref 3.8–4.9)
Alkaline Phosphatase: 140 [IU]/L — ABNORMAL HIGH (ref 44–121)
BUN/Creatinine Ratio: 13 (ref 9–20)
BUN: 14 mg/dL (ref 6–24)
Bilirubin Total: 0.5 mg/dL (ref 0.0–1.2)
CO2: 26 mmol/L (ref 20–29)
Calcium: 9.6 mg/dL (ref 8.7–10.2)
Chloride: 103 mmol/L (ref 96–106)
Creatinine, Ser: 1.12 mg/dL (ref 0.76–1.27)
Globulin, Total: 2.7 g/dL (ref 1.5–4.5)
Glucose: 98 mg/dL (ref 70–99)
Potassium: 5 mmol/L (ref 3.5–5.2)
Sodium: 141 mmol/L (ref 134–144)
Total Protein: 6.9 g/dL (ref 6.0–8.5)
eGFR: 77 mL/min/{1.73_m2} (ref >59)

## 2022-11-05 LAB — CBC WITH DIFFERENTIAL/PLATELET
Basophils Absolute: 0 10*3/uL (ref 0.0–0.2)
Basos: 0 %
EOS (ABSOLUTE): 0 10*3/uL (ref 0.0–0.4)
Eos: 0 %
Hematocrit: 35.3 % — ABNORMAL LOW (ref 37.5–51.0)
Hemoglobin: 11.1 g/dL — ABNORMAL LOW (ref 13.0–17.7)
Immature Grans (Abs): 0 10*3/uL (ref 0.0–0.1)
Immature Granulocytes: 0 %
Lymphocytes Absolute: 0.6 10*3/uL — ABNORMAL LOW (ref 0.7–3.1)
Lymphs: 14 %
MCH: 25.8 pg — ABNORMAL LOW (ref 26.6–33.0)
MCHC: 31.4 g/dL — ABNORMAL LOW (ref 31.5–35.7)
MCV: 82 fL (ref 79–97)
Monocytes Absolute: 0.2 10*3/uL (ref 0.1–0.9)
Monocytes: 5 %
Neutrophils Absolute: 3.5 10*3/uL (ref 1.4–7.0)
Neutrophils: 81 %
Platelets: 275 10*3/uL (ref 150–450)
RBC: 4.3 x10E6/uL (ref 4.14–5.80)
RDW: 13.7 % (ref 11.6–15.4)
WBC: 4.4 10*3/uL (ref 3.4–10.8)

## 2022-11-17 ENCOUNTER — Ambulatory Visit
Admission: RE | Admit: 2022-11-17 | Discharge: 2022-11-17 | Disposition: A | Discharge status: Home / Self Care | Payer: 59 | Source: Ambulatory Visit | Attending: Neurology

## 2022-11-17 DIAGNOSIS — R202 Paresthesia of skin: Secondary | ICD-10-CM

## 2022-11-17 DIAGNOSIS — M62838 Other muscle spasm: Secondary | ICD-10-CM

## 2022-11-17 DIAGNOSIS — R292 Abnormal reflex: Secondary | ICD-10-CM

## 2022-11-17 DIAGNOSIS — R261 Paralytic gait: Secondary | ICD-10-CM

## 2022-12-07 ENCOUNTER — Other Ambulatory Visit: Payer: Self-pay | Admitting: Family Medicine

## 2022-12-09 ENCOUNTER — Telehealth: Payer: Self-pay | Admitting: Neurology

## 2022-12-09 NOTE — Telephone Encounter (Signed)
Pt called an informed that his results have not been released we will call him once they have been released from the provider

## 2022-12-09 NOTE — Telephone Encounter (Signed)
Pt called for MRI test results

## 2022-12-10 ENCOUNTER — Other Ambulatory Visit: Payer: Self-pay | Admitting: *Deleted

## 2022-12-10 ENCOUNTER — Other Ambulatory Visit: Payer: 59

## 2022-12-10 ENCOUNTER — Ambulatory Visit (INDEPENDENT_AMBULATORY_CARE_PROVIDER_SITE_OTHER): Payer: 59 | Admitting: *Deleted

## 2022-12-10 DIAGNOSIS — E291 Testicular hypofunction: Secondary | ICD-10-CM | POA: Diagnosis not present

## 2022-12-10 DIAGNOSIS — E871 Hypo-osmolality and hyponatremia: Secondary | ICD-10-CM

## 2022-12-10 NOTE — Progress Notes (Signed)
Pt given testosterone injection IM right glut and tolerated well.

## 2022-12-11 ENCOUNTER — Encounter: Payer: Self-pay | Admitting: Family Medicine

## 2022-12-11 LAB — BASIC METABOLIC PANEL
BUN/Creatinine Ratio: 12 (ref 9–20)
BUN: 13 mg/dL (ref 6–24)
CO2: 23 mmol/L (ref 20–29)
Calcium: 8.9 mg/dL (ref 8.7–10.2)
Chloride: 92 mmol/L — ABNORMAL LOW (ref 96–106)
Creatinine, Ser: 1.05 mg/dL (ref 0.76–1.27)
Glucose: 74 mg/dL (ref 70–99)
Potassium: 4.6 mmol/L (ref 3.5–5.2)
Sodium: 127 mmol/L — ABNORMAL LOW (ref 134–144)
eGFR: 83 mL/min/{1.73_m2} (ref >59)

## 2022-12-12 ENCOUNTER — Ambulatory Visit: Payer: Self-pay | Admitting: Family Medicine

## 2022-12-12 NOTE — Telephone Encounter (Signed)
Chief Complaint: lab results  Disposition: [] ED /[] Urgent Care (no appt availability in office) / [] Appointment(In office/virtual)/ []  [facility]/ [] Home Care/ [] Refused Recommended Disposition /[] [facility]/ [x]  Follow-up with PCP Additional Notes: Patient called asking if Dr Darlyn Read had reviewed his sodium level yet. Pt attempted yesterday via mychart to contact office regarding this. Pt is very concerned and mentioned he was hospitalized back in October for hyponatremia. Pt would like a follow up call back at [phone].   Copied from CRM [id number]. Topic: Clinical - Lab/Test Results >> Dec 12, 2022  3:35 PM Deaijah H wrote: Reason for CRM: Patient would like to discuss Sodium level results Reason for Disposition  Caller requesting lab results  (Exception: Routine or non-urgent lab result.)  Answer Assessment - Initial Assessment Questions 1. REASON FOR CALL or QUESTION: "What is your reason for calling today?" or "How can I best help you?" or "What question do you have that I can help answer?"     Patient calling in asking for Dr Darlyn Read to review his sodium level results. Pt states he messaged the office yesterday without an answer returned. Pt states he is not having any symptoms. Pt states he was hospitalized back in October for hyponatremia and is worried since he has not heard back. "I feel fine but I felt fine last time as well".  Answer Assessment - Initial Assessment Questions 1. REASON FOR CALL or QUESTION: "What is your reason for calling today?" or "How can I best help you?" or "What question do you have that I can help answer?"     Patient would like for Dr Darlyn Read to review his sodium level and would like to know what course of action to take if needed (repeat labs, go to ED). Pt is concerned as he reached out yesterday thru myChart and was not contacted. Pt states he was hospitalized with hyponatremia back in October. 2. CALLER: Document the source of call.  (e.g., laboratory, patient).     Patient.  Protocols used: Information Only Call - No Triage-A-AH, PCP Call - No Triage-A-AH

## 2022-12-14 NOTE — Telephone Encounter (Signed)
He should check in with his nephrologist to determine why the sodium is so low.

## 2022-12-15 NOTE — Telephone Encounter (Signed)
My Chart message sent

## 2022-12-16 ENCOUNTER — Other Ambulatory Visit: Payer: Self-pay | Admitting: *Deleted

## 2022-12-16 ENCOUNTER — Other Ambulatory Visit: Payer: 59

## 2022-12-16 DIAGNOSIS — E871 Hypo-osmolality and hyponatremia: Secondary | ICD-10-CM

## 2022-12-16 LAB — BASIC METABOLIC PANEL
BUN/Creatinine Ratio: 7 — ABNORMAL LOW (ref 9–20)
BUN: 7 mg/dL (ref 6–24)
CO2: 26 mmol/L (ref 20–29)
Calcium: 8.8 mg/dL (ref 8.7–10.2)
Chloride: 93 mmol/L — ABNORMAL LOW (ref 96–106)
Creatinine, Ser: 0.99 mg/dL (ref 0.76–1.27)
Glucose: 76 mg/dL (ref 70–99)
Potassium: 4.5 mmol/L (ref 3.5–5.2)
Sodium: 127 mmol/L — ABNORMAL LOW (ref 134–144)
eGFR: 89 mL/min/{1.73_m2} (ref >59)

## 2022-12-22 ENCOUNTER — Other Ambulatory Visit: Payer: Self-pay | Admitting: *Deleted

## 2022-12-22 ENCOUNTER — Encounter: Payer: Self-pay | Admitting: Family Medicine

## 2022-12-22 ENCOUNTER — Telehealth: Payer: Self-pay | Admitting: Family Medicine

## 2022-12-22 DIAGNOSIS — E871 Hypo-osmolality and hyponatremia: Secondary | ICD-10-CM

## 2022-12-22 NOTE — Telephone Encounter (Signed)
Copied from CRM [id number]. Topic: Clinical - Request for Lab/Test Order >> Dec 22, 2022  3:33 PM Luke Davila wrote: Reason for CRM: Pt is requesting orders for lab

## 2022-12-22 NOTE — Telephone Encounter (Signed)
Duplicate message, patient also sent MyChart message Labs have been ordered

## 2022-12-23 ENCOUNTER — Ambulatory Visit (INDEPENDENT_AMBULATORY_CARE_PROVIDER_SITE_OTHER): Payer: 59

## 2022-12-23 DIAGNOSIS — E291 Testicular hypofunction: Secondary | ICD-10-CM

## 2022-12-23 DIAGNOSIS — E871 Hypo-osmolality and hyponatremia: Secondary | ICD-10-CM

## 2022-12-23 NOTE — Progress Notes (Signed)
Patient is in office today for a nurse visit for Testosterone Injection. Patient Injection was given in the  Left upper quad. gluteus. Patient tolerated injection well.

## 2022-12-24 ENCOUNTER — Encounter: Payer: Self-pay | Admitting: Family Medicine

## 2022-12-24 LAB — BMP8+EGFR
BUN/Creatinine Ratio: 11 (ref 9–20)
BUN: 12 mg/dL (ref 6–24)
CO2: 22 mmol/L (ref 20–29)
Calcium: 8.7 mg/dL (ref 8.7–10.2)
Chloride: 94 mmol/L — ABNORMAL LOW (ref 96–106)
Creatinine, Ser: 1.05 mg/dL (ref 0.76–1.27)
Glucose: 73 mg/dL (ref 70–99)
Potassium: 4.3 mmol/L (ref 3.5–5.2)
Sodium: 130 mmol/L — ABNORMAL LOW (ref 134–144)
eGFR: 83 mL/min/{1.73_m2} (ref >59)

## 2023-01-05 ENCOUNTER — Ambulatory Visit (INDEPENDENT_AMBULATORY_CARE_PROVIDER_SITE_OTHER): Payer: 59

## 2023-01-05 ENCOUNTER — Other Ambulatory Visit: Payer: 59

## 2023-01-05 ENCOUNTER — Other Ambulatory Visit: Payer: Self-pay | Admitting: *Deleted

## 2023-01-05 DIAGNOSIS — E291 Testicular hypofunction: Secondary | ICD-10-CM | POA: Diagnosis not present

## 2023-01-05 DIAGNOSIS — E871 Hypo-osmolality and hyponatremia: Secondary | ICD-10-CM

## 2023-01-05 DIAGNOSIS — N179 Acute kidney failure, unspecified: Secondary | ICD-10-CM

## 2023-01-05 NOTE — Progress Notes (Signed)
Patient is in office today for a nurse visit for Testosterone Injection. Patient Injection was given in the  Right upper quad. gluteus. Patient tolerated injection well.

## 2023-01-12 ENCOUNTER — Encounter (HOSPITAL_COMMUNITY): Payer: Self-pay

## 2023-01-14 DIAGNOSIS — N179 Acute kidney failure, unspecified: Secondary | ICD-10-CM | POA: Diagnosis not present

## 2023-01-14 DIAGNOSIS — D631 Anemia in chronic kidney disease: Secondary | ICD-10-CM | POA: Diagnosis not present

## 2023-01-14 DIAGNOSIS — N182 Chronic kidney disease, stage 2 (mild): Secondary | ICD-10-CM | POA: Diagnosis not present

## 2023-01-14 DIAGNOSIS — I129 Hypertensive chronic kidney disease with stage 1 through stage 4 chronic kidney disease, or unspecified chronic kidney disease: Secondary | ICD-10-CM | POA: Diagnosis not present

## 2023-01-19 ENCOUNTER — Ambulatory Visit: Payer: 59

## 2023-01-19 ENCOUNTER — Encounter (HOSPITAL_COMMUNITY): Payer: Self-pay

## 2023-01-26 ENCOUNTER — Ambulatory Visit: Payer: BC Managed Care – PPO | Admitting: Pulmonary Disease

## 2023-01-26 ENCOUNTER — Encounter (HOSPITAL_COMMUNITY): Payer: Self-pay

## 2023-01-26 ENCOUNTER — Encounter: Payer: Self-pay | Admitting: Pulmonary Disease

## 2023-01-26 ENCOUNTER — Ambulatory Visit: Payer: Self-pay

## 2023-01-26 VITALS — BP 143/90 | HR 79 | Ht 74.0 in | Wt 210.2 lb

## 2023-01-26 DIAGNOSIS — Z5181 Encounter for therapeutic drug level monitoring: Secondary | ICD-10-CM

## 2023-01-26 DIAGNOSIS — D869 Sarcoidosis, unspecified: Secondary | ICD-10-CM

## 2023-01-26 DIAGNOSIS — E291 Testicular hypofunction: Secondary | ICD-10-CM

## 2023-01-26 DIAGNOSIS — D860 Sarcoidosis of lung: Secondary | ICD-10-CM | POA: Diagnosis not present

## 2023-01-26 LAB — CBC WITH DIFFERENTIAL/PLATELET
Basophils Absolute: 0 10*3/uL (ref 0.0–0.1)
Basophils Relative: 1 % (ref 0.0–3.0)
Eosinophils Absolute: 0.2 10*3/uL (ref 0.0–0.7)
Eosinophils Relative: 5.2 % — ABNORMAL HIGH (ref 0.0–5.0)
HCT: 34 % — ABNORMAL LOW (ref 39.0–52.0)
Hemoglobin: 11 g/dL — ABNORMAL LOW (ref 13.0–17.0)
Lymphocytes Relative: 19 % (ref 12.0–46.0)
Lymphs Abs: 0.7 10*3/uL (ref 0.7–4.0)
MCHC: 32.4 g/dL (ref 30.0–36.0)
MCV: 81.7 fL (ref 78.0–100.0)
Monocytes Absolute: 0.5 10*3/uL (ref 0.1–1.0)
Monocytes Relative: 12.7 % — ABNORMAL HIGH (ref 3.0–12.0)
Neutro Abs: 2.3 10*3/uL (ref 1.4–7.7)
Neutrophils Relative %: 62.1 % (ref 43.0–77.0)
Platelets: 258 10*3/uL (ref 150.0–400.0)
RBC: 4.16 Mil/uL — ABNORMAL LOW (ref 4.22–5.81)
RDW: 15.5 % (ref 11.5–15.5)
WBC: 3.7 10*3/uL — ABNORMAL LOW (ref 4.0–10.5)

## 2023-01-26 LAB — COMPREHENSIVE METABOLIC PANEL
ALT: 11 U/L (ref 0–53)
AST: 28 U/L (ref 0–37)
Albumin: 4.2 g/dL (ref 3.5–5.2)
Alkaline Phosphatase: 124 U/L — ABNORMAL HIGH (ref 39–117)
BUN: 14 mg/dL (ref 6–23)
CO2: 28 meq/L (ref 19–32)
Calcium: 9.1 mg/dL (ref 8.4–10.5)
Chloride: 97 meq/L (ref 96–112)
Creatinine, Ser: 1.01 mg/dL (ref 0.40–1.50)
GFR: 82.62 mL/min (ref >60.00)
Glucose, Bld: 70 mg/dL (ref 70–99)
Potassium: 4.2 meq/L (ref 3.5–5.1)
Sodium: 132 meq/L — ABNORMAL LOW (ref 135–145)
Total Bilirubin: 0.8 mg/dL (ref 0.2–1.2)
Total Protein: 7.3 g/dL (ref 6.0–8.3)

## 2023-01-26 MED ORDER — BREZTRI AEROSPHERE 160-9-4.8 MCG/ACT IN AERO: 2.0000 | INHALATION_SPRAY | Freq: Two times a day (BID) | RESPIRATORY_TRACT | 3 refills | Status: DC

## 2023-01-26 NOTE — Progress Notes (Signed)
RICK WILTSE    [id number]    26-Feb-1965  Primary Care Physician:Stacks, Broadus John, MD  Referring Physician: Mechele Claude, MD [address] [place],  [place] [address]  Chief complaint:  Follow-up for Sarcoidosis Started methotrexate in Aug 2023 Prednisone tapered off October 2023 by October  HPI: 58 y.o. with history of interstitial lung disease, CKD, HTN.  Originally seen as a consult in June 2023 with abnormal CT showing upper lung predominant changes.  This was initially noted in 2021 and was told he had sarcoidosis based on the pattern of interstitial lung disease.  He never had a biopsy done or any specific treatment given.  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 Has increasing dyspnea on exertion with flareups 2-3 times a year treated with prednisone tapers.  He was started on breztri in April 2023  He is here with his ex-wife who helps him with his healthcare needs and appointments  Underwent bronchoscopy with endobronchial ultrasound of lymph nodes on 07/17/21 with findings of granulomatous inflammation consistent with sarcoid.  Cultures are negative.  There is no evidence of malignancy He has been started on prednisone at 40 mg a day at the end of July 2023 and was tapered off by October 2023  Pets: No pets Occupation: Works as a Network engineer Exposures: No mold, hot tub, Financial controller.  No feather pillows or comforter Smoking history: Never smoker Travel history: No significant travel his Relevant family history: No family history of lung disease  Interim history: Discussed the use of AI scribe software for clinical note transcription with the patient, who gave verbal consent to proceed.  The patient, with a history of sarcoidosis, presents after significant weight loss. He attributes the weight loss to a low carbohydrate diet initiated after a prediabetes diagnosis. He denies intentional weight loss and reports feeling  'too big' prior to the weight loss.  The patient's sarcoidosis is managed with methotrexate and prednisone for flare-ups. He reports two to three flare-ups since the last visit, each managed with a three to four day course of prednisone 10-20mg  daily.  The patient also has a history of respiratory symptoms managed with albuterol as needed. He reports not receiving Breztri, a daily inhaler, for an extended period and has been relying on albuterol.  Additionally, the patient has a history of hypertension, previously managed with an unspecified medication. The medication was discontinued due to symptomatic hypotension after improvement in kidney function.   Outpatient Encounter Medications as of 01/26/2023  Medication Sig   albuterol (VENTOLIN HFA) 108 (90 Base) MCG/ACT inhaler Inhale 1-2 puffs into the lungs every 4 (four) hours as needed for wheezing or shortness of breath.   baclofen (LIORESAL) 10 MG tablet Start half tablet twice daily x 2 weeks, then increase to half tablet in AM and 1 tablet at bedtime x 2 weeks, then 1 tablet twice daily.   ferrous sulfate 324 MG TBEC Take 324 mg by mouth at bedtime.   folic acid (FOLVITE) 1 MG tablet Take 1 mg by mouth daily.   methotrexate (RHEUMATREX) 2.5 MG tablet Take 8 tablets (20 mg total) by mouth once a week. Caution:Chemotherapy. Protect from light.   prednisoLONE acetate (PRED FORTE) 1 % ophthalmic suspension Place 1 drop into both eyes every 2 (two) hours while awake.   predniSONE (DELTASONE) 20 MG tablet Take 1 tablet (20 mg total) by mouth daily with breakfast. (Patient taking  differently: Take 20 mg by mouth daily with breakfast. As needed)   rosuvastatin (CRESTOR) 10 MG tablet TAKE 1 TABLET BY MOUTH EVERY DAY   tamsulosin (FLOMAX) 0.4 MG CAPS capsule Take 0.4 mg by mouth daily after breakfast.   testosterone cypionate (DEPOTESTOSTERONE CYPIONATE) 200 MG/ML injection Inject 1 mL (200 mg total) into the muscle every 14 (fourteen) days. INJECT 1 ML  (200 MG TOTAL) INTO THE MUSCLE EVERY 14 DAYS   Facility-Administered Encounter Medications as of 01/26/2023  Medication   testosterone cypionate (DEPOTESTOSTERONE CYPIONATE) injection 200 mg   testosterone cypionate (DEPOTESTOSTERONE CYPIONATE) injection 200 mg    Physical Exam: Blood pressure (!) 143/90, pulse 79, height 6\' 2"  (1.88 m), weight 210 lb 3.2 oz (95.3 kg), SpO2 97%. Gen:      No acute distress HEENT:  EOMI, sclera anicteric Neck:     No masses; no thyromegaly Lungs:    Clear to auscultation bilaterally; normal respiratory effort CV:         Regular rate and rhythm; no murmurs Abd:      + bowel sounds; soft, non-tender; no palpable masses, no distension Ext:    No edema; adequate peripheral perfusion Skin:      Warm and dry; no rash Neuro: alert and oriented x 3 Psych: normal mood and affect   Data Reviewed: Imaging: CT high-resolution 06/13/2020-unchanged pattern of pulmonary fibrosis with upper lung predominance with peribronchovascular opacity, bronchiolectasis, enlargement Instylan hilar lymph nodes  High-resolution CT 06/28/2021-pulmonary fibrosis in similar pattern with slight worsening of fibrotic changes.  Hilar, mediastinal lymphadenopathy  High resolution CT 02/19/2022-stable pattern of pulmonary fibrosis, changes of sarcoidosis I reviewed the images personally  PFTs: 06/17/2021 FVC 3.14 [69%], FEV1 2.40 [64%], F/F 76, TLC 4.36 [56%], DLCO 15.23 [47%] Moderate obstruction with bronchodilator response, severe restriction and diffusion impairment  01/20/2022 FVC 3.16 [57%], FEV1 2.42 [54%], TLC 4.64 [60%], DLCO 13.09 [41%] Moderate obstruction, severe restriction and diffusion impairment  Labs: CTD serologies 06/10/2021-negative Angiotensin-converting enzyme 06/10/2021- 47  Bronchoscopy with endobronchial ultrasound biopsy 07/17/2021 Station 7 lymph node biopsy with granulomatous inflammation.  Lymph node cultures negative to date  Assessment:  Sarcoidosis CT  findings are suggestive of sarcoidosis and lymph node biopsy by endobronchial ultrasound confirms granulomatous inflammation. Has finished a prednisone course and is stable on methotrexate.  Continue folic acid He will require long-term therapy given severity of disease, fibrotic changes and also presence of ocular sarcoid.   Infrequent flare-ups managed with short courses of Prednisone 10-20mg  daily. Stable on Methotrexate 20mg  weekly. -Continue current management plan. -Check labs today due to Methotrexate use.  Obstructive airway disease Unlikely to be COPD as he is a non-smoker.  He could have asthma versus airway involvement of sarcoid Samples of Breztri and send a prescription Albuterol rescue inhaler  General Health Maintenance -Plan for repeat CT scan and lung function test in six months. -Check blood pressure again today due to elevated reading.   Plan/Recommendations: Continue breztri inhaler Continue methotrexate, folic acid Labs for monitoring CT, PFTs in 6 months  Chilton Greathouse MD [facility] 01/26/2023, 1:41 PM  CC: Mechele Claude, MD

## 2023-01-26 NOTE — Patient Instructions (Signed)
VISIT SUMMARY:  You visited today to discuss your recent weight loss, sarcoidosis management, asthma, and general health maintenance. We reviewed your current medications and made some adjustments to better manage your conditions.  YOUR PLAN:  -SARCOIDOSIS: Sarcoidosis is an inflammatory disease that affects multiple organs, particularly the lungs and lymph glands. Your condition is currently stable with Methotrexate 20mg  weekly, and flare-ups are managed with short courses of Prednisone 10-20mg  daily. We will continue this management plan and check your labs today due to Methotrexate use.  -ASTHMA: Asthma is a condition where your airways narrow and swell, producing extra mucus and making it difficult to breathe. You have been using Albuterol as needed, but we will provide you with a sample and prescription for [facility], a daily inhaler. If the sample is effective, please fill the prescription. Continue using Albuterol as a rescue inhaler.  -GENERAL HEALTH MAINTENANCE: We will plan for a repeat CT scan and lung function test in six months to monitor your condition. Additionally, we will check your blood pressure again today due to an elevated reading.  INSTRUCTIONS:  Please follow up with the lab today for blood tests due to Methotrexate use. If the [facility] sample is effective, fill the prescription. We will also recheck your blood pressure today. Plan for a repeat CT scan and lung function test in six months.

## 2023-02-02 ENCOUNTER — Ambulatory Visit: Payer: Self-pay

## 2023-02-08 ENCOUNTER — Encounter: Payer: Self-pay | Admitting: Family Medicine

## 2023-02-09 ENCOUNTER — Ambulatory Visit (INDEPENDENT_AMBULATORY_CARE_PROVIDER_SITE_OTHER): Payer: BC Managed Care – PPO

## 2023-02-09 ENCOUNTER — Other Ambulatory Visit: Payer: BC Managed Care – PPO

## 2023-02-09 DIAGNOSIS — E871 Hypo-osmolality and hyponatremia: Secondary | ICD-10-CM | POA: Diagnosis not present

## 2023-02-09 DIAGNOSIS — E291 Testicular hypofunction: Secondary | ICD-10-CM

## 2023-02-09 NOTE — Progress Notes (Signed)
Pt given testosterone injection left upper outer quadrant. Pt tolerated well.

## 2023-02-10 ENCOUNTER — Telehealth: Payer: Self-pay | Admitting: Nurse Practitioner

## 2023-02-10 LAB — BMP8+EGFR
BUN/Creatinine Ratio: 16 (ref 9–20)
BUN: 20 mg/dL (ref 6–24)
CO2: 24 mmol/L (ref 20–29)
Calcium: 9.3 mg/dL (ref 8.7–10.2)
Chloride: 98 mmol/L (ref 96–106)
Creatinine, Ser: 1.22 mg/dL (ref 0.76–1.27)
Glucose: 71 mg/dL (ref 70–99)
Potassium: 4.5 mmol/L (ref 3.5–5.2)
Sodium: 137 mmol/L (ref 134–144)
eGFR: 69 mL/min/{1.73_m2} (ref >59)

## 2023-02-11 ENCOUNTER — Encounter: Payer: Self-pay | Admitting: Family Medicine

## 2023-02-16 ENCOUNTER — Ambulatory Visit: Payer: BC Managed Care – PPO | Attending: Internal Medicine | Admitting: Internal Medicine

## 2023-02-16 ENCOUNTER — Ambulatory Visit (INDEPENDENT_AMBULATORY_CARE_PROVIDER_SITE_OTHER): Payer: BC Managed Care – PPO

## 2023-02-16 ENCOUNTER — Other Ambulatory Visit: Payer: Self-pay | Admitting: Internal Medicine

## 2023-02-16 VITALS — BP 110/82 | HR 98 | Ht 75.0 in | Wt 207.8 lb

## 2023-02-16 DIAGNOSIS — R0602 Shortness of breath: Secondary | ICD-10-CM

## 2023-02-16 DIAGNOSIS — I493 Ventricular premature depolarization: Secondary | ICD-10-CM

## 2023-02-16 DIAGNOSIS — R9431 Abnormal electrocardiogram [ECG] [EKG]: Secondary | ICD-10-CM

## 2023-02-16 DIAGNOSIS — I700 Atherosclerosis of aorta: Secondary | ICD-10-CM

## 2023-02-16 DIAGNOSIS — D869 Sarcoidosis, unspecified: Secondary | ICD-10-CM

## 2023-02-16 DIAGNOSIS — I6521 Occlusion and stenosis of right carotid artery: Secondary | ICD-10-CM

## 2023-02-16 NOTE — Patient Instructions (Addendum)
 Medication Instructions:  Your physician recommends that you continue on your current medications as directed. Please refer to the Current Medication list given to you today.  *If you need a refill on your cardiac medications before your next appointment, please call your pharmacy*   Testing/Procedures: ZIO XT- Long Term Monitor Instructions  Your physician has requested you wear a ZIO patch monitor for 3 days.  This is a single patch monitor. [employer] supplies one patch monitor per enrollment. Additional stickers are not available. Please do not apply patch if you will be having a Nuclear Stress Test,  Echocardiogram, Cardiac CT, MRI, or Chest Xray during the period you would be wearing the  monitor. The patch cannot be worn during these tests. You cannot remove and re-apply the  ZIO XT patch monitor.  Your ZIO patch monitor will be mailed 3 day USPS to your address on file. It may take 3-5 days  to receive your monitor after you have been enrolled.  Once you have received your monitor, please review the enclosed instructions. Your monitor  has already been registered assigning a specific monitor serial # to you.  Billing and Patient Assistance Program Information  We have supplied [employer] with any of your insurance information on file for billing purposes. [employer] offers a sliding scale Patient Assistance Program for patients that do not have  insurance, or whose insurance does not completely cover the cost of the ZIO monitor.  You must apply for the Patient Assistance Program to qualify for this discounted rate.  To apply, please call [employer] at [phone], select option 4, select option 2, ask to apply for  Patient Assistance Program. Sanna Crystal will ask your household income, and how many people  are in your household. They will quote your out-of-pocket cost based on that information.  [employer] will also be able to set up a 34-month, interest-free payment plan if needed.  Applying the  monitor   Shave hair from upper left chest.  Hold abrader disc by orange tab. Rub abrader in 40 strokes over the upper left chest as  indicated in your monitor instructions.  Clean area with 4 enclosed alcohol pads. Let dry.  Apply patch as indicated in monitor instructions. Patch will be placed under collarbone on left  side of chest with arrow pointing upward.  Rub patch adhesive wings for 2 minutes. Remove white label marked "1". Remove the white  label marked "2". Rub patch adhesive wings for 2 additional minutes.  While looking in a mirror, press and release button in center of patch. A small green light will  flash 3-4 times. This will be your only indicator that the monitor has been turned on.  Do not shower for the first 24 hours. You may shower after the first 24 hours.  Press the button if you feel a symptom. You will hear a small click. Record Date, Time and  Symptom in the Patient Logbook.  When you are ready to remove the patch, follow instructions on the last 2 pages of Patient  Logbook. Stick patch monitor onto the last page of Patient Logbook.  Place Patient Logbook in the blue and white box. Use locking tab on box and tape box closed  securely. The blue and white box has prepaid postage on it. Please place it in the mailbox as  soon as possible. Your physician should have your test results approximately 7 days after the  monitor has been mailed back to [facility].  Call [facility]  at [phone] if you have questions regarding  your ZIO XT patch monitor. Call them immediately if you see an orange light blinking on your  monitor.  If your monitor falls off in less than 4 days, contact our Monitor department at [phone].  If your monitor becomes loose or falls off after 4 days call [employer] at [phone] for  suggestions on securing your monitor    Follow-Up: At [facility], you and your health needs are our priority.  As part of  our continuing mission to provide you with exceptional heart care, we have created designated Provider Care Teams.  These Care Teams include your primary Cardiologist (physician) and Advanced Practice Providers (APPs -  Physician Assistants and Nurse Practitioners) who all work together to provide you with the care you need, when you need it.   Your next appointment:   6 month(s)  Provider:   APP

## 2023-02-16 NOTE — Progress Notes (Unsigned)
 Enrolled for [employer] to mail a ZIO XT long term holter monitor to [address], [place], [place] [address].
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
## 2023-02-18 ENCOUNTER — Ambulatory Visit: Payer: 59 | Admitting: Neurology

## 2023-02-23 DIAGNOSIS — N182 Chronic kidney disease, stage 2 (mild): Secondary | ICD-10-CM | POA: Diagnosis not present

## 2023-02-26 ENCOUNTER — Other Ambulatory Visit: Payer: Self-pay | Admitting: Pulmonary Disease

## 2023-03-01 DIAGNOSIS — I493 Ventricular premature depolarization: Secondary | ICD-10-CM | POA: Diagnosis not present

## 2023-03-01 DIAGNOSIS — R0602 Shortness of breath: Secondary | ICD-10-CM | POA: Diagnosis not present

## 2023-03-01 DIAGNOSIS — R9431 Abnormal electrocardiogram [ECG] [EKG]: Secondary | ICD-10-CM

## 2023-03-01 DIAGNOSIS — D869 Sarcoidosis, unspecified: Secondary | ICD-10-CM | POA: Diagnosis not present

## 2023-03-11 DIAGNOSIS — I493 Ventricular premature depolarization: Secondary | ICD-10-CM | POA: Diagnosis not present

## 2023-03-11 DIAGNOSIS — R0602 Shortness of breath: Secondary | ICD-10-CM | POA: Diagnosis not present

## 2023-03-11 DIAGNOSIS — R339 Retention of urine, unspecified: Secondary | ICD-10-CM | POA: Diagnosis not present

## 2023-03-13 ENCOUNTER — Encounter: Payer: Self-pay | Admitting: Internal Medicine

## 2023-03-16 ENCOUNTER — Telehealth: Payer: Self-pay | Admitting: Family Medicine

## 2023-03-16 NOTE — Telephone Encounter (Signed)
 Apt scheuled

## 2023-03-16 NOTE — Telephone Encounter (Signed)
 Copied from CRM [id number]. Topic: Appointments - Scheduling Inquiry for Clinic >> Mar 16, 2023 10:53 AM Izetta Dakin wrote: Reason for CRM: Patient called in to schedule a testosterone shot, contacted [facility] and was advised to schedule as a nurse visit. However, I received an error message when scheduling appointment. Patient requested the 03/17/2023 11:30 am slot . Please call patient back to confirm appointment.

## 2023-03-17 ENCOUNTER — Other Ambulatory Visit

## 2023-03-17 ENCOUNTER — Ambulatory Visit

## 2023-03-18 ENCOUNTER — Other Ambulatory Visit

## 2023-03-18 ENCOUNTER — Ambulatory Visit

## 2023-03-18 DIAGNOSIS — E871 Hypo-osmolality and hyponatremia: Secondary | ICD-10-CM

## 2023-03-19 LAB — BMP8+EGFR
BUN/Creatinine Ratio: 15 (ref 9–20)
BUN: 16 mg/dL (ref 6–24)
CO2: 24 mmol/L (ref 20–29)
Calcium: 8.8 mg/dL (ref 8.7–10.2)
Chloride: 91 mmol/L — ABNORMAL LOW (ref 96–106)
Creatinine, Ser: 1.1 mg/dL (ref 0.76–1.27)
Glucose: 73 mg/dL (ref 70–99)
Potassium: 4.4 mmol/L (ref 3.5–5.2)
Sodium: 128 mmol/L — ABNORMAL LOW (ref 134–144)
eGFR: 78 mL/min/{1.73_m2} (ref >59)

## 2023-03-21 ENCOUNTER — Encounter: Payer: Self-pay | Admitting: Family Medicine

## 2023-03-24 ENCOUNTER — Other Ambulatory Visit

## 2023-03-25 ENCOUNTER — Telehealth: Payer: Self-pay | Admitting: Neurology

## 2023-03-25 NOTE — Telephone Encounter (Signed)
 Please clarify the dose of baclofen.  I usually start very low at half tablet twice daily to be sure he is tolerating it, but there is room to increase the dose to baclofen 10mg  (1 tablet) twice daily.  His vitamin B12 levels were normal when we checked them, so taking supplementation may not be helpful. Please also offer him sooner follow-up - ok to add on 4/15.

## 2023-03-25 NOTE — Telephone Encounter (Signed)
 Called patient and informed him of Dr. Eliane Decree advice below. Patient stated that he is taking the baclofen 10 mg three times a day. He states that dose is not helping. Patient also aware that his B 12 levels were normal when we checked the so supplementation may not be helpful. Patient was offered the 4/15 appointment and he can take that. He is aware someone from the front will contact hm for a time.   Patient is aware that I will contact him back in regards to the medication once I hear back from Dr. Allena Katz.

## 2023-03-25 NOTE — Telephone Encounter (Signed)
 Pt called in stating he doesn't think the muscle relaxers are doing anything. Is it ok if he can take B12 vitamins instead of the muscle relaxers?

## 2023-03-26 ENCOUNTER — Other Ambulatory Visit: Payer: Self-pay | Admitting: Neurology

## 2023-03-26 NOTE — Telephone Encounter (Signed)
 Patient returned call and was informed of Dr. Eliane Decree advice below. Patient is aware we have him scheduled for 4/15 at 3:30 pm. Patient verbalized understanding and had no further question or concerns.

## 2023-03-26 NOTE — Telephone Encounter (Signed)
 Will reevaluate him in the office and then decide the next steps or medication changes. Continue baclofen 10mg  TID for now.

## 2023-03-26 NOTE — Telephone Encounter (Signed)
 Called patient and left a message for a call back.

## 2023-03-31 DIAGNOSIS — R339 Retention of urine, unspecified: Secondary | ICD-10-CM | POA: Diagnosis not present

## 2023-04-01 ENCOUNTER — Other Ambulatory Visit

## 2023-04-01 ENCOUNTER — Ambulatory Visit (INDEPENDENT_AMBULATORY_CARE_PROVIDER_SITE_OTHER)

## 2023-04-01 ENCOUNTER — Other Ambulatory Visit: Payer: Self-pay | Admitting: Family Medicine

## 2023-04-01 ENCOUNTER — Other Ambulatory Visit: Payer: Self-pay

## 2023-04-01 DIAGNOSIS — E291 Testicular hypofunction: Secondary | ICD-10-CM | POA: Diagnosis not present

## 2023-04-01 DIAGNOSIS — N179 Acute kidney failure, unspecified: Secondary | ICD-10-CM

## 2023-04-01 DIAGNOSIS — N1831 Chronic kidney disease, stage 3a: Secondary | ICD-10-CM

## 2023-04-01 DIAGNOSIS — N1832 Chronic kidney disease, stage 3b: Secondary | ICD-10-CM

## 2023-04-01 DIAGNOSIS — E538 Deficiency of other specified B group vitamins: Secondary | ICD-10-CM

## 2023-04-01 NOTE — Progress Notes (Signed)
 Patient is in office today for a nurse visit for Testosterone Injection. Patient Injection was given in the  Right upper quad. gluteus. Patient tolerated injection well.

## 2023-04-02 ENCOUNTER — Encounter: Payer: Self-pay | Admitting: Family Medicine

## 2023-04-02 LAB — BMP8+EGFR
BUN/Creatinine Ratio: 8 — ABNORMAL LOW (ref 9–20)
BUN: 8 mg/dL (ref 6–24)
CO2: 25 mmol/L (ref 20–29)
Calcium: 8.8 mg/dL (ref 8.7–10.2)
Chloride: 99 mmol/L (ref 96–106)
Creatinine, Ser: 1 mg/dL (ref 0.76–1.27)
Glucose: 82 mg/dL (ref 70–99)
Potassium: 4.1 mmol/L (ref 3.5–5.2)
Sodium: 137 mmol/L (ref 134–144)
eGFR: 88 mL/min/{1.73_m2} (ref >59)

## 2023-04-06 ENCOUNTER — Other Ambulatory Visit: Payer: Self-pay | Admitting: Family Medicine

## 2023-04-14 ENCOUNTER — Ambulatory Visit: Admitting: Family Medicine

## 2023-04-14 ENCOUNTER — Telehealth: Payer: Self-pay | Admitting: Family Medicine

## 2023-04-14 ENCOUNTER — Ambulatory Visit: Admitting: Nurse Practitioner

## 2023-04-14 ENCOUNTER — Encounter: Payer: Self-pay | Admitting: Family Medicine

## 2023-04-14 VITALS — BP 111/69 | HR 93 | Temp 97.8°F | Ht 75.0 in | Wt 208.0 lb

## 2023-04-14 DIAGNOSIS — E782 Mixed hyperlipidemia: Secondary | ICD-10-CM | POA: Diagnosis not present

## 2023-04-14 DIAGNOSIS — E291 Testicular hypofunction: Secondary | ICD-10-CM | POA: Diagnosis not present

## 2023-04-14 DIAGNOSIS — R030 Elevated blood-pressure reading, without diagnosis of hypertension: Secondary | ICD-10-CM | POA: Diagnosis not present

## 2023-04-14 DIAGNOSIS — N182 Chronic kidney disease, stage 2 (mild): Secondary | ICD-10-CM | POA: Diagnosis not present

## 2023-04-14 DIAGNOSIS — M1631 Unilateral osteoarthritis resulting from hip dysplasia, right hip: Secondary | ICD-10-CM

## 2023-04-14 DIAGNOSIS — I129 Hypertensive chronic kidney disease with stage 1 through stage 4 chronic kidney disease, or unspecified chronic kidney disease: Secondary | ICD-10-CM | POA: Diagnosis not present

## 2023-04-14 DIAGNOSIS — J8410 Pulmonary fibrosis, unspecified: Secondary | ICD-10-CM | POA: Diagnosis not present

## 2023-04-14 DIAGNOSIS — D631 Anemia in chronic kidney disease: Secondary | ICD-10-CM | POA: Diagnosis not present

## 2023-04-14 DIAGNOSIS — N139 Obstructive and reflux uropathy, unspecified: Secondary | ICD-10-CM | POA: Diagnosis not present

## 2023-04-14 DIAGNOSIS — Z0279 Encounter for issue of other medical certificate: Secondary | ICD-10-CM

## 2023-04-14 LAB — LIPID PANEL

## 2023-04-14 MED ORDER — TESTOSTERONE CYPIONATE 200 MG/ML IM SOLN: 200.0000 mg | INTRAMUSCULAR | 1 refills | Status: DC

## 2023-04-14 MED ORDER — TRAZODONE HCL 150 MG PO TABS: ORAL_TABLET | ORAL | 5 refills | Status: DC

## 2023-04-14 NOTE — Progress Notes (Signed)
 Subjective:  Patient ID: Luke Davila, male    DOB: 04/18/1965  Age: 58 y.o. MRN: [id number]  CC: Medical Management of Chronic Issues (Not sleeping. [employer]pro[employer] 2 hours a night. Makes him almost fall asleep at work. Not taking anything for it. ) and Hip Problem (Specialist didn't see any issue.)   HPI Luke Davila presents for renal insufficiency, obstructive uropathy, sarcoidosis with pulmonary fibrosis and low testosterone.   Prednisone helping with breathing currently. Breztri used BID. Only doing one puff BID.  Has history of sarcoidosis that makes his breathing much more difficult.   Follow up for testosterone deficiency: Pt. Using medication as directed. Denies any sx referrable to DVT such as edema or erythema of legs. No dyspnea or chest pain. Energy level reported as being good. Libido is normal and denies E.D. Feels strength is adequate and improved from baseline. Patient saw specialist, neurology, for his right hip they could not find a source of the problem.  He would like to see orthopedics.  Pain continues      04/14/2023    9:48 AM 10/16/2022    2:29 PM 10/08/2022    9:19 AM  Depression screen PHQ 2/9  Decreased Interest 1 1 1   Down, Depressed, Hopeless 0 0 0  PHQ - 2 Score 1 1 1   Altered sleeping 1 1 1   Tired, decreased energy 0 1 1  Change in appetite 0 0 0  Feeling bad or failure about yourself  0 0 0  Trouble concentrating 0 0 0  Moving slowly or fidgety/restless 0 0 0  Suicidal thoughts 0 0 0  PHQ-9 Score 2 3 3   Difficult doing work/chores Somewhat difficult Somewhat difficult Somewhat difficult    History Luke Davila has a past medical history of Anemia of chronic renal failure, unspecified CKD stage, Arthritis, Avascular necrosis of hip (HCC), Enlarged prostate, Glaucoma, Gout, History of gout, and Pneumonia.   He has a past surgical history that includes Foot surgery (Left, 01/07/1983); Left hand surgery; Total hip arthroplasty (Left, 04/09/2015); Colonoscopy  with propofol (N/A, 11/27/2015); Wisdom tooth extraction; Transurethral resection of prostate (N/A, 03/27/2021); Endobronchial ultrasound (Bilateral, 07/17/2021); Fine needle aspiration (07/17/2021); and Video bronchoscopy (07/17/2021).   His family history includes Hypertension in his mother.He reports that he has never smoked. He has never used smokeless tobacco. He reports current alcohol use. He reports that he does not use drugs.    ROS Review of Systems  Constitutional:  Negative for fever.  Respiratory:  Negative for shortness of breath.   Cardiovascular:  Negative for chest pain.  Musculoskeletal:  Positive for arthralgias (Right hip).  Skin:  Negative for rash.    Objective:  BP 111/69   Pulse 93   Temp 97.8 F (36.6 C)   Ht 6\' 3"  (1.905 m)   Wt 208 lb (94.3 kg)   SpO2 98%   BMI 26.00 kg/m   BP Readings from Last 3 Encounters:  04/14/23 111/69  02/16/23 110/82  01/26/23 (!) 143/90    Wt Readings from Last 3 Encounters:  04/14/23 208 lb (94.3 kg)  02/16/23 207 lb 12.8 oz (94.3 kg)  01/26/23 210 lb 3.2 oz (95.3 kg)     Physical Exam Vitals reviewed.  Constitutional:      Appearance: He is well-developed.  HENT:     Head: Normocephalic and atraumatic.     Right Ear: External ear normal.     Left Ear: External ear normal.     Mouth/Throat:  Pharynx: No oropharyngeal exudate or posterior oropharyngeal erythema.  Eyes:     Pupils: Pupils are equal, round, and reactive to light.  Cardiovascular:     Rate and Rhythm: Normal rate and regular rhythm.     Heart sounds: No murmur heard. Pulmonary:     Effort: No respiratory distress.     Breath sounds: Normal breath sounds.  Musculoskeletal:        General: Tenderness (Right greater trochanter area) present.     Cervical back: Normal range of motion and neck supple.  Neurological:     Mental Status: He is alert and oriented to person, place, and time.      Assessment & Plan:  Mixed hyperlipidemia -      Lipid panel  Hypogonadism in male -     Testosterone Cypionate; Inject 1 mL (200 mg total) into the muscle every 14 (fourteen) days. INJECT 1 ML (200 MG TOTAL) INTO THE MUSCLE EVERY 14 DAYS  Dispense: 6 mL; Refill: 1 -     Testosterone,Free and Total  Osteoarthritis resulting from right hip dysplasia -     CMP14+EGFR -     CBC with Differential/Platelet -     Ambulatory referral to Orthopedics  Obstructive uropathy -     CMP14+EGFR  Pulmonary fibrosis (HCC) -     CMP14+EGFR -     CBC with Differential/Platelet -     Lipid panel  Other orders -     traZODone HCl; Use from 1/3 to 1 tablet nightly as needed for sleep.  Dispense: 30 tablet; Refill: 5     Follow-up: No follow-ups on file.  Mechele Claude, M.D.

## 2023-04-14 NOTE — Telephone Encounter (Signed)
 Luke Davila dropped off FMLA forms to be completed and signed.  Form Fee Paid? (Y/N)     yes       If NO, form is placed on front office manager desk to hold until payment received. If YES, then form will be placed in the RX/HH Nurse Coordinators box for completion.  Form will not be processed until payment is received   Call, when ready!

## 2023-04-15 ENCOUNTER — Encounter: Payer: Self-pay | Admitting: Family Medicine

## 2023-04-15 LAB — CBC WITH DIFFERENTIAL/PLATELET
Basophils Absolute: 0 10*3/uL (ref 0.0–0.2)
Basos: 1 %
EOS (ABSOLUTE): 0.3 10*3/uL (ref 0.0–0.4)
Eos: 6 %
Hematocrit: 32.7 % — ABNORMAL LOW (ref 37.5–51.0)
Hemoglobin: 10.3 g/dL — ABNORMAL LOW (ref 13.0–17.7)
Immature Grans (Abs): 0 10*3/uL (ref 0.0–0.1)
Immature Granulocytes: 1 %
Lymphocytes Absolute: 1 10*3/uL (ref 0.7–3.1)
Lymphs: 22 %
MCH: 25.7 pg — ABNORMAL LOW (ref 26.6–33.0)
MCHC: 31.5 g/dL (ref 31.5–35.7)
MCV: 82 fL (ref 79–97)
Monocytes Absolute: 0.5 10*3/uL (ref 0.1–0.9)
Monocytes: 11 %
Neutrophils Absolute: 2.5 10*3/uL (ref 1.4–7.0)
Neutrophils: 59 %
Platelets: 253 10*3/uL (ref 150–450)
RBC: 4.01 x10E6/uL — ABNORMAL LOW (ref 4.14–5.80)
RDW: 13.3 % (ref 11.6–15.4)
WBC: 4.3 10*3/uL (ref 3.4–10.8)

## 2023-04-15 LAB — LIPID PANEL
Cholesterol, Total: 141 mg/dL (ref 100–199)
HDL: 61 mg/dL (ref >39)
LDL CALC COMMENT:: 2.3 ratio (ref 0.0–5.0)
LDL Chol Calc (NIH): 70 mg/dL (ref 0–99)
Triglycerides: 46 mg/dL (ref 0–149)
VLDL Cholesterol Cal: 10 mg/dL (ref 5–40)

## 2023-04-15 LAB — CMP14+EGFR
ALT: 27 IU/L (ref 0–44)
AST: 39 IU/L (ref 0–40)
Albumin: 3.9 g/dL (ref 3.8–4.9)
Alkaline Phosphatase: 134 IU/L — ABNORMAL HIGH (ref 44–121)
BUN/Creatinine Ratio: 16 (ref 9–20)
BUN: 18 mg/dL (ref 6–24)
Bilirubin Total: 0.4 mg/dL (ref 0.0–1.2)
CO2: 23 mmol/L (ref 20–29)
Calcium: 9 mg/dL (ref 8.7–10.2)
Chloride: 99 mmol/L (ref 96–106)
Creatinine, Ser: 1.1 mg/dL (ref 0.76–1.27)
Globulin, Total: 2.5 g/dL (ref 1.5–4.5)
Glucose: 80 mg/dL (ref 70–99)
Potassium: 4.3 mmol/L (ref 3.5–5.2)
Sodium: 136 mmol/L (ref 134–144)
Total Protein: 6.4 g/dL (ref 6.0–8.5)
eGFR: 78 mL/min/{1.73_m2} (ref >59)

## 2023-04-15 NOTE — Progress Notes (Signed)
Hello [name],  Your lab result is normal and/or stable.Some minor variations that are not significant are commonly marked abnormal, but do not represent any medical problem for you.  Best regards, Dontarious Schaum, M.D.

## 2023-04-16 LAB — TESTOSTERONE,FREE AND TOTAL
Testosterone, Free: 6.8 pg/mL — ABNORMAL LOW (ref 7.2–24.0)
Testosterone: 614 ng/dL (ref 264–916)

## 2023-04-16 NOTE — Telephone Encounter (Signed)
 PCP completed and signed FMLA forms. They have been faxed to [facility] at fax number [phone]. Patient has been contacted and informed they are complete. Copy at front desk.

## 2023-04-17 ENCOUNTER — Ambulatory Visit: Admitting: Family Medicine

## 2023-04-20 DIAGNOSIS — N5201 Erectile dysfunction due to arterial insufficiency: Secondary | ICD-10-CM | POA: Diagnosis not present

## 2023-04-21 ENCOUNTER — Ambulatory Visit: Admitting: Neurology

## 2023-04-21 ENCOUNTER — Encounter: Payer: Self-pay | Admitting: Neurology

## 2023-04-21 VITALS — BP 107/69 | HR 92 | Ht 75.0 in | Wt 209.0 lb

## 2023-04-21 DIAGNOSIS — R269 Unspecified abnormalities of gait and mobility: Secondary | ICD-10-CM | POA: Diagnosis not present

## 2023-04-21 NOTE — Patient Instructions (Signed)
 Nerve testing the legs  ELECTROMYOGRAM AND NERVE CONDUCTION STUDIES (EMG/NCS) INSTRUCTIONS  How to Prepare The neurologist conducting the EMG will need to know if you have certain medical conditions. Tell the neurologist and other EMG lab personnel if you: Have a pacemaker or any other electrical medical device Take blood-thinning medications Have hemophilia, a blood-clotting disorder that causes prolonged bleeding Bathing Take a shower or bath shortly before your exam in order to remove oils from your skin. Don't apply lotions or creams before the exam.  What to Expect You'll likely be asked to change into a hospital gown for the procedure and lie down on an examination table. The following explanations can help you understand what will happen during the exam.  Electrodes. The neurologist or a technician places surface electrodes at various locations on your skin depending on where you're experiencing symptoms. Or the neurologist may insert needle electrodes at different sites depending on your symptoms.  Sensations. The electrodes will at times transmit a tiny electrical current that you may feel as a twinge or spasm. The needle electrode may cause discomfort or pain that usually ends shortly after the needle is removed. If you are concerned about discomfort or pain, you may want to talk to the neurologist about taking a short break during the exam.  Instructions. During the needle EMG, the neurologist will assess whether there is any spontaneous electrical activity when the muscle is at rest - activity that isn't present in healthy muscle tissue - and the degree of activity when you slightly contract the muscle.  He or she will give you instructions on resting and contracting a muscle at appropriate times. Depending on what muscles and nerves the neurologist is examining, he or she may ask you to change positions during the exam.  After your EMG You may experience some temporary, minor bruising  where the needle electrode was inserted into your muscle. This bruising should fade within several days. If it persists, contact your primary care doctor.

## 2023-04-21 NOTE — Progress Notes (Unsigned)
 Follow-up Visit   Date: 04/21/2023    Luke Davila MRN: [id number] DOB: [birth year]    Luke Davila is a 58 y.o. right-handed male with right optic neuritis (2015), hyperlipidemia, BPH, and sarcoidosis returning to the clinic for follow-up of gait abnormality.  The patient was accompanied to the clinic by self.  IMPRESSION/PLAN: Gait abnormality.  Exam shows increased tone at the knees, however his reflexes are normal.  Imaging of the neuroaxis does not show any structural pathology and myelopathy labs are normal.  I do not have a good answer for his increased tone, which feels like spasticity.  I will check NCS/EMG of the legs to look for peripheral nerve irritability.  He has not family history or weakness so rare conditions such as hereditary spastic paraparesis is unlikely.  Symptoms do not fit neurodegenerative condition either. I discussed that we could repeat MRI lumbar spine since this was last done in 2021, but they would like to see orthopeadics next week and then decide.  Continue baclofen 10mg  TID.  Going forward, he may want to see PM&R for their opinion on spasticity.    --------------------------------------------- History of present illness: Starting around ~2018, he began having difficulty with walking, described has leg heaviness and stiffness.  Since then, he has progressive gait difficulty, especially with stretching the legs or straightening them out.  He tends to walk with his knees bent.  He has fallen a few times, none this year.  He has completed PT which transiently helps.  He denies weakness and walks unassisted.  He sometimes gets cramps in the lower legs.   Previously, he was evaluated at [facility] for right eye blindness in 2015 which was found to be optic neuritis.  Imaging of the brain and cervical spine was unrevealing.  He has seen orthooeadics and found to have left avascular necrosis of the hip for which he has had surgery.   More recently in April 2024, he  had MRI cervical spine which showed foraminal stenosis at C3-4 and C4-5, no cord pathology.  He was referred for further evaluation.   UPDATE 04/21/2023:  He is here for follow-up visit.  He continues to have difficulty with walking and balance.  He has not had any new falls.  MRI brain and thoracic spine returned normal.  He denies weakness, numbness, or tingling. No family history of similar condition.  Medications:  Current Outpatient Medications on File Prior to Visit  Medication Sig Dispense Refill   albuterol (VENTOLIN HFA) 108 (90 Base) MCG/ACT inhaler Inhale 1-2 puffs into the lungs every 4 (four) hours as needed for wheezing or shortness of breath. (Patient not taking: Reported on 04/14/2023) 8.5 each 3   baclofen (LIORESAL) 10 MG tablet Take 1 tablet (10 mg total) by mouth 3 (three) times daily. 270 tablet 1   Budeson-Glycopyrrol-Formoterol (BREZTRI AEROSPHERE) 160-9-4.8 MCG/ACT AERO Inhale 2 puffs into the lungs in the morning and at bedtime. 3 each 3   DENTA 5000 PLUS 1.1 % CREA dental cream Place 1 Application onto teeth as needed.     ferrous sulfate 324 MG TBEC Take 324 mg by mouth at bedtime.     folic acid (FOLVITE) 1 MG tablet Take 1 mg by mouth daily. (Patient not taking: Reported on 04/14/2023)     methotrexate (RHEUMATREX) 2.5 MG tablet TAKE 6 TABLETS (15 MG TOTAL) BY MOUTH ONCE A WEEK. CAUTION:CHEMOTHERAPY. PROTECT FROM LIGHT. 72 tablet 2   prednisoLONE acetate (PRED FORTE) 1 % ophthalmic suspension  Place 1 drop into both eyes every 2 (two) hours while awake.     predniSONE (DELTASONE) 20 MG tablet Take 1 tablet (20 mg total) by mouth daily with breakfast. (Patient taking differently: Take 20 mg by mouth daily with breakfast. As needed) 30 tablet 2   rosuvastatin (CRESTOR) 10 MG tablet TAKE 1 TABLET BY MOUTH EVERY DAY (Patient not taking: Reported on 04/14/2023) 90 tablet 3   tadalafil (CIALIS) 5 MG tablet Take 5 mg by mouth daily as needed.     tamsulosin (FLOMAX) 0.4 MG CAPS  capsule Take 0.4 mg by mouth daily after breakfast.     testosterone cypionate (DEPOTESTOSTERONE CYPIONATE) 200 MG/ML injection Inject 1 mL (200 mg total) into the muscle every 14 (fourteen) days. INJECT 1 ML (200 MG TOTAL) INTO THE MUSCLE EVERY 14 DAYS 6 mL 1   traZODone (DESYREL) 150 MG tablet Use from 1/3 to 1 tablet nightly as needed for sleep. 30 tablet 5   Current Facility-Administered Medications on File Prior to Visit  Medication Dose Route Frequency Provider Last Rate Last Admin   testosterone cypionate (DEPOTESTOSTERONE CYPIONATE) injection 200 mg  200 mg Intramuscular Q14 Days Mechele Claude, MD   200 mg at 04/14/23 1010    Allergies: No Known Allergies  Vital Signs:  There were no vitals taken for this visit.   Neurological Exam: MENTAL STATUS including orientation to time, place, person, recent and remote memory, attention span and concentration, language, and fund of knowledge is normal.  Speech is not dysarthric.  CRANIAL NERVES:  No visual field defects.  Pupils equal round and reactive to light.  Normal conjugate, extra-ocular eye movements in all directions of gaze.  No ptosis.  Face is symmetric. Palate elevates symmetrically.  Tongue is midline.  MOTOR:  Motor strength is 5/5 in all extremities.  No atrophy, fasciculations or abnormal movements.  No pronator drift.  Tone is increased in the legs.    MSRs:  Reflexes are 2+/4 throughout.  Plantars are down going.  SENSORY:  Intact to vibration throughout.  COORDINATION/GAIT:  Normal finger-to- nose-finger. Finger tapping intact.  Heel tapping is slowed.  He struggles to get out of the chair and pushes off with hands.  Gait is flexed at the knees and slightly stopped, mildly wide-based.    Data: Labs 10/27/2022:  GAD65 neg, SPEP with IFE neg, vitamin B1 10, vitamin B12 522, folate > 24, copper 174, zinc 64, vitamin E 12  MRI cervical spine 08/20/2022: Severe right foraminal stenosis and mild canal stenosis at C3-C4 and  C4-C5.   MRI lumbar spine 02/02/2019: 1. Mild degenerative disc disease at L4-5 and L5-S1 with slight left foraminal narrowing at L4-5 and L5-S1. 2. No other significant abnormalities.  MRI thoracic spine 12/08/2022: 1. No abnormality seen to explain the clinical presentation. No cord compression or focal cord lesion. No sign of demyelinating disease in the thoracic spine. 2. Mild non-compressive disc bulges at T2-3 and T3-4. Minimal non-compressive disc bulges at T8-9 and T9-10. 3. Bilateral lung disease with areas of opacity and emphysematous change, not primarily or completely evaluated. Known history of sarcoid.   MRI brain wo contrast 12/08/2022: No acute or reversible finding. Moderate chronic small-vessel ischemic changes of the pons and cerebral hemispheric white matter.   Thank you for allowing me to participate in patient's care.  If I can answer any additional questions, I would be pleased to do so.    Sincerely,    Nashley Cordoba K. Allena Katz, DO

## 2023-04-27 DIAGNOSIS — R269 Unspecified abnormalities of gait and mobility: Secondary | ICD-10-CM | POA: Diagnosis not present

## 2023-04-29 ENCOUNTER — Ambulatory Visit (INDEPENDENT_AMBULATORY_CARE_PROVIDER_SITE_OTHER)

## 2023-04-29 ENCOUNTER — Other Ambulatory Visit

## 2023-04-29 DIAGNOSIS — N1832 Chronic kidney disease, stage 3b: Secondary | ICD-10-CM

## 2023-04-29 DIAGNOSIS — E291 Testicular hypofunction: Secondary | ICD-10-CM

## 2023-04-29 NOTE — Progress Notes (Signed)
 Patient is in office today for a nurse visit for Testosterone Injection. Patient Injection was given in the  Left upper quad. gluteus. Patient tolerated injection well.

## 2023-04-30 LAB — BMP8+EGFR
BUN/Creatinine Ratio: 11 (ref 9–20)
BUN: 13 mg/dL (ref 6–24)
CO2: 24 mmol/L (ref 20–29)
Calcium: 9.5 mg/dL (ref 8.7–10.2)
Chloride: 97 mmol/L (ref 96–106)
Creatinine, Ser: 1.23 mg/dL (ref 0.76–1.27)
Glucose: 77 mg/dL (ref 70–99)
Potassium: 4.5 mmol/L (ref 3.5–5.2)
Sodium: 135 mmol/L (ref 134–144)
eGFR: 68 mL/min/{1.73_m2} (ref >59)

## 2023-05-03 ENCOUNTER — Encounter: Payer: Self-pay | Admitting: Family Medicine

## 2023-05-03 NOTE — Progress Notes (Signed)
Hello [name],  Your lab result is normal and/or stable.Some minor variations that are not significant are commonly marked abnormal, but do not represent any medical problem for you.  Best regards, Dontarious Schaum, M.D.

## 2023-05-04 DIAGNOSIS — R339 Retention of urine, unspecified: Secondary | ICD-10-CM | POA: Diagnosis not present

## 2023-05-06 ENCOUNTER — Other Ambulatory Visit: Payer: Self-pay | Admitting: Family Medicine

## 2023-05-12 DIAGNOSIS — N5201 Erectile dysfunction due to arterial insufficiency: Secondary | ICD-10-CM | POA: Diagnosis not present

## 2023-05-13 ENCOUNTER — Other Ambulatory Visit: Payer: Self-pay | Admitting: Family Medicine

## 2023-05-14 ENCOUNTER — Other Ambulatory Visit

## 2023-05-14 ENCOUNTER — Ambulatory Visit

## 2023-05-14 DIAGNOSIS — N1832 Chronic kidney disease, stage 3b: Secondary | ICD-10-CM | POA: Diagnosis not present

## 2023-05-15 LAB — BMP8+EGFR
BUN/Creatinine Ratio: 12 (ref 9–20)
BUN: 13 mg/dL (ref 6–24)
CO2: 23 mmol/L (ref 20–29)
Calcium: 8.7 mg/dL (ref 8.7–10.2)
Chloride: 101 mmol/L (ref 96–106)
Creatinine, Ser: 1.08 mg/dL (ref 0.76–1.27)
Glucose: 89 mg/dL (ref 70–99)
Potassium: 4.1 mmol/L (ref 3.5–5.2)
Sodium: 136 mmol/L (ref 134–144)
eGFR: 80 mL/min/{1.73_m2} (ref >59)

## 2023-05-17 ENCOUNTER — Encounter: Payer: Self-pay | Admitting: Family Medicine

## 2023-05-17 NOTE — Progress Notes (Signed)
Hello Luke Davila,  Your lab result is normal and/or stable.Some minor variations that are not significant are commonly marked abnormal, but do not represent any medical problem for you.  Best regards, Dontarious Schaum, M.D.

## 2023-05-19 ENCOUNTER — Ambulatory Visit

## 2023-05-25 ENCOUNTER — Other Ambulatory Visit

## 2023-05-25 ENCOUNTER — Telehealth: Payer: Self-pay

## 2023-05-25 ENCOUNTER — Ambulatory Visit

## 2023-05-25 DIAGNOSIS — N1832 Chronic kidney disease, stage 3b: Secondary | ICD-10-CM

## 2023-05-25 DIAGNOSIS — E291 Testicular hypofunction: Secondary | ICD-10-CM | POA: Diagnosis not present

## 2023-05-25 NOTE — Telephone Encounter (Signed)
 Copied from CRM [id number]. Topic: Clinical - Medical Advice >> May 21, 2023  3:26 PM Hilton Lucky wrote: Reason for CRM: Luke Davila with [name] calling to inquire if CT ordered by Dr. Waylan Haggard needs prior authorization. Please c/b at [phone] ext [phone]. >> May 25, 2023  4:06 PM Hilton Lucky wrote: [name] - Calling to check on the status of this PA, patient's CT that is in question is scheduled for 05/21. If [facility] does not have confirmation in-hand or available by 05/20, they will have to call and cancel appointment for imaging for this patient.   Please advise

## 2023-05-25 NOTE — Progress Notes (Signed)
 Patient is in office today for a nurse visit for Testosterone Injection. Patient Injection was given in the  Right upper quad. gluteus. Patient tolerated injection well.

## 2023-05-26 ENCOUNTER — Ambulatory Visit: Payer: Self-pay | Admitting: Family Medicine

## 2023-05-26 ENCOUNTER — Telehealth: Payer: Self-pay | Admitting: *Deleted

## 2023-05-26 LAB — BMP8+EGFR
BUN/Creatinine Ratio: 10 (ref 9–20)
BUN: 10 mg/dL (ref 6–24)
CO2: 19 mmol/L — ABNORMAL LOW (ref 20–29)
Calcium: 9.7 mg/dL (ref 8.7–10.2)
Chloride: 96 mmol/L (ref 96–106)
Creatinine, Ser: 1.03 mg/dL (ref 0.76–1.27)
Glucose: 80 mg/dL (ref 70–99)
Potassium: 4.4 mmol/L (ref 3.5–5.2)
Sodium: 133 mmol/L — ABNORMAL LOW (ref 134–144)
eGFR: 85 mL/min/1.73

## 2023-05-26 NOTE — Progress Notes (Signed)
Hello Luke Davila,  Your lab result is normal and/or stable.Some minor variations that are not significant are commonly marked abnormal, but do not represent any medical problem for you.  Best regards, Dontarious Schaum, M.D.

## 2023-05-26 NOTE — Telephone Encounter (Signed)
 Copied from CRM [id number]. Topic: Clinical - Medical Advice >> May 21, 2023  3:26 PM Hilton Lucky wrote: Reason for CRM: Soyla Duverney with [name] calling to inquire if CT ordered by Dr. Waylan Haggard needs prior authorization. Please c/b at [phone] ext [phone]. >> May 25, 2023  4:06 PM Hilton Lucky wrote: [name] - Calling to check on the status of this PA, patient's CT that is in question is scheduled for 05/21. If [facility] does not have confirmation in-hand or available by 05/20, they will have to call and cancel appointment for imaging for this patient.   See phone note 05/25/23- this was already completed.

## 2023-05-27 ENCOUNTER — Ambulatory Visit
Admission: RE | Admit: 2023-05-27 | Discharge: 2023-05-27 | Disposition: A | Discharge status: Home / Self Care | Source: Ambulatory Visit | Attending: Pulmonary Disease | Admitting: Pulmonary Disease

## 2023-05-27 DIAGNOSIS — D860 Sarcoidosis of lung: Secondary | ICD-10-CM

## 2023-05-27 DIAGNOSIS — I700 Atherosclerosis of aorta: Secondary | ICD-10-CM | POA: Diagnosis not present

## 2023-05-27 DIAGNOSIS — I3139 Other pericardial effusion (noninflammatory): Secondary | ICD-10-CM | POA: Diagnosis not present

## 2023-05-29 ENCOUNTER — Ambulatory Visit: Admitting: Neurology

## 2023-05-29 DIAGNOSIS — R2689 Other abnormalities of gait and mobility: Secondary | ICD-10-CM | POA: Diagnosis not present

## 2023-05-29 DIAGNOSIS — R269 Unspecified abnormalities of gait and mobility: Secondary | ICD-10-CM

## 2023-05-29 NOTE — Procedures (Signed)
 [facility]  [address] [place], [address]  [place], [place] [address] Tel: [phone] Fax: [phone] Test Date:  05/29/2023  Patient: Luke Davila DOB: [birth year] Physician: Reyna Cava, DO  Sex: Male Height: 6\' 3"  Ref Phys: Reyna Cava, DO  ID#: [id number]   Technician:    History: This is a 58 year old man referred for evaluation of gait instability.  NCV & EMG Findings: Extensive electrodiagnostic testing of the right lower extremity and additional studies of the left shows:  Bilateral sural and superficial peroneal sensory responses are within normal limits. Bilateral peroneal motor responses are absent at the extensor digitorum brevis, and normal at the tibialis anterior.  Tibial motor response is reduced on the right (R3.0 mV), and normal on the left. Bilateral H reflex studies are within normal limits when adjusted for patient's height.   Chronic motor axonal loss changes are seen affecting primarily the muscles below the knee bilaterally, with fibrillation potentials isolated to the left gastrocnemius muscle.   Impression: The electrophysiologic findings show active on chronic neurogenic changes involving the lower extremities which is worse distally, which can be seen with motor neuropathy or disorder of anterior horn cells.  Recommend clinical correlation and repeat evaluation in 6-12 months.   ___________________________ Reyna Cava, DO    Nerve Conduction Studies   Stim Site NR Peak (ms) Norm Peak (ms) O-P Amp (V) Norm O-P Amp  Left Sup Peroneal Anti Sensory (Ant Lat Mall)  32 C  12 cm    3.1 <4.6 6.5 >4  Right Sup Peroneal Anti Sensory (Ant Lat Mall)  32 C  12 cm    2.4 <4.6 5.4 >4  Left Sural Anti Sensory (Lat Mall)  32 C  Calf    3.3 <4.6 7.1 >4  Right Sural Anti Sensory (Lat Mall)  32 C  Calf    3.0 <4.6 7.9 >4     Stim Site NR Onset (ms) Norm Onset (ms) O-P Amp (mV) Norm O-P Amp Site1 Site2 Delta-0 (ms) Dist (cm) Vel (m/s) Norm Vel  (m/s)  Left Peroneal Motor (Ext Dig Brev)  32 C  Ankle *NR  <6.0  >2.5 B Fib Ankle  0.0  >40  B Fib *NR     Poplt B Fib  0.0  >40  Poplt *NR            Right Peroneal Motor (Ext Dig Brev)  32 C  Ankle *NR  <6.0  >2.5 B Fib Ankle  0.0  >40  B Fib *NR     Poplt B Fib  0.0  >40  Poplt *NR            Left Peroneal TA Motor (Tib Ant)  32 C  Fib Head    2.5 <4.5 4.5 >3 Poplit Fib Head 2.0 10.0 50 >40  Poplit    4.5 <5.7 4.3         Right Peroneal TA Motor (Tib Ant)  32 C  Fib Head    3.0 <4.5 4.1 >3 Poplit Fib Head 1.8 10.0 56 >40  Poplit    4.8 <5.7 3.8         Left Tibial Motor (Abd Hall Brev)  32 C  Ankle    4.5 <6.0 4.8 >4 Knee Ankle 11.2 45.0 40 >40  Knee    15.7  3.5         Right Tibial Motor (Abd Hall Brev)  32 C  Ankle    4.5 <6.0 *3.0 >4 Knee  Ankle 11.0 47.0 43 >40  Knee    15.5  2.9          Electromyography   Side Muscle Ins.Act Fibs Fasc Recrt Amp Dur Poly Activation Comment  Right AntTibialis *1+ Nml Nml *2- *1+ *1+ *1+ Nml N/A  Right Gastroc Nml Nml Nml *2- *1+ *1+ *1+ *Variable N/A  Right Flex Dig Long Nml Nml Nml *2- *1+ *1+ *1+ *Variable N/A  Right RectFemoris Nml Nml Nml *1- *1+ *1+ *1+ Nml N/A  Right BicepsFemS Nml Nml Nml Nml Nml Nml Nml Nml N/A  Right GluteusMed Nml Nml Nml Nml Nml Nml Nml Nml N/A  Right Lumbo Parasp Low *1+ Nml Nml Nml *- *- *- Nml N/A  Left AntTibialis Nml Nml Nml *1- *1+ *1+ *1+ Nml N/A  Left Gastroc Nml *1+ Nml *2- *1+ *1+ *1+ *Variable N/A  Left Flex Dig Long Nml Nml Nml *2- *1+ *1+ *1+ Nml N/A  Left RectFemoris Nml Nml Nml Nml Nml Nml Nml Nml N/A  Left GluteusMed Nml Nml Nml Nml Nml Nml Nml Nml N/A  Left BicepsFemS Nml Nml Nml Nml Nml Nml Nml Nml N/A      Waveforms:

## 2023-06-02 ENCOUNTER — Ambulatory Visit: Payer: Self-pay | Admitting: Neurology

## 2023-06-04 DIAGNOSIS — R339 Retention of urine, unspecified: Secondary | ICD-10-CM | POA: Diagnosis not present

## 2023-06-08 ENCOUNTER — Other Ambulatory Visit

## 2023-06-08 ENCOUNTER — Other Ambulatory Visit: Payer: Self-pay

## 2023-06-08 ENCOUNTER — Ambulatory Visit (INDEPENDENT_AMBULATORY_CARE_PROVIDER_SITE_OTHER): Admitting: *Deleted

## 2023-06-08 DIAGNOSIS — E291 Testicular hypofunction: Secondary | ICD-10-CM

## 2023-06-08 DIAGNOSIS — N1832 Chronic kidney disease, stage 3b: Secondary | ICD-10-CM

## 2023-06-08 NOTE — Progress Notes (Signed)
 Patient is in office today for a nurse visit for Testosterone Injection. Patient Injection was given in the  Left upper quad. gluteus. Patient tolerated injection well.

## 2023-06-09 ENCOUNTER — Telehealth: Payer: Self-pay | Admitting: Family Medicine

## 2023-06-09 ENCOUNTER — Telehealth: Payer: Self-pay | Admitting: Neurology

## 2023-06-09 ENCOUNTER — Ambulatory Visit: Payer: Self-pay | Admitting: Family Medicine

## 2023-06-09 ENCOUNTER — Telehealth: Payer: Self-pay

## 2023-06-09 ENCOUNTER — Telehealth (INDEPENDENT_AMBULATORY_CARE_PROVIDER_SITE_OTHER): Admitting: Family Medicine

## 2023-06-09 ENCOUNTER — Encounter: Payer: Self-pay | Admitting: Family Medicine

## 2023-06-09 DIAGNOSIS — N3090 Cystitis, unspecified without hematuria: Secondary | ICD-10-CM

## 2023-06-09 LAB — BMP8+EGFR
BUN/Creatinine Ratio: 10 (ref 9–20)
BUN: 12 mg/dL (ref 6–24)
CO2: 23 mmol/L (ref 20–29)
Calcium: 9.6 mg/dL (ref 8.7–10.2)
Chloride: 97 mmol/L (ref 96–106)
Creatinine, Ser: 1.21 mg/dL (ref 0.76–1.27)
Glucose: 82 mg/dL (ref 70–99)
Potassium: 4.1 mmol/L (ref 3.5–5.2)
Sodium: 136 mmol/L (ref 134–144)
eGFR: 70 mL/min/{1.73_m2} (ref >59)

## 2023-06-09 LAB — URINALYSIS, ROUTINE W REFLEX MICROSCOPIC
Bilirubin, UA: NEGATIVE
Glucose, UA: NEGATIVE
Ketones, UA: NEGATIVE
Nitrite, UA: NEGATIVE
Protein,UA: NEGATIVE
RBC, UA: NEGATIVE
Specific Gravity, UA: 1.016 (ref 1.005–1.030)
Urobilinogen, Ur: 0.2 mg/dL (ref 0.2–1.0)
pH, UA: 7 (ref 5.0–7.5)

## 2023-06-09 LAB — MICROSCOPIC EXAMINATION
Bacteria, UA: NONE SEEN
Casts: NONE SEEN /LPF

## 2023-06-09 MED ORDER — CIPROFLOXACIN HCL 500 MG PO TABS: 500.0000 mg | ORAL_TABLET | Freq: Two times a day (BID) | ORAL | 0 refills | Status: DC

## 2023-06-09 NOTE — Telephone Encounter (Signed)
 Called and informed pt of results of EMG per Dr. Lydia Sams , he understood and He has an appointment  6/10.

## 2023-06-09 NOTE — Telephone Encounter (Signed)
 Pt called in returning a call to [facility]

## 2023-06-09 NOTE — Telephone Encounter (Signed)
 Scheduled for video visit to discuss. LS

## 2023-06-09 NOTE — Telephone Encounter (Signed)
 See results. Patient has been contacted.

## 2023-06-09 NOTE — Telephone Encounter (Unsigned)
 Copied from CRM [id number]. Topic: Clinical - Medication Question >> Jun 09, 2023  8:15 AM Luke Davila wrote: Reason for CRM: Patient is inquiring what medication will be called in for the infection shown from his urinalysis. Callback number is [phone]

## 2023-06-09 NOTE — Progress Notes (Signed)
    Subjective:    Patient ID: Luke Davila, male    DOB: Apr 02, 1965, 58 y.o.   MRN: [id number]   HPI: Luke Davila is a 58 y.o. male presenting for burning with urination and frequency onset yesterday. Left specimen yesterday. No burning. Used 18 caths  yesterday. Denies fever . No flank pain. No nausea, vomiting.       04/14/2023    9:48 AM 10/16/2022    2:29 PM 10/08/2022    9:19 AM 09/17/2022    2:00 PM 09/17/2022    1:51 PM  Depression screen PHQ 2/9  Decreased Interest 1 1 1 1  0  Down, Depressed, Hopeless 0 0 0 1 0  PHQ - 2 Score 1 1 1 2  0  Altered sleeping 1 1 1 1    Tired, decreased energy 0 1 1 1    Change in appetite 0 0 0 0   Feeling bad or failure about yourself  0 0 0 0   Trouble concentrating 0 0 0 0   Moving slowly or fidgety/restless 0 0 0 3   Suicidal thoughts 0 0 0 0   PHQ-9 Score 2 3 3 7    Difficult doing work/chores Somewhat difficult Somewhat difficult Somewhat difficult Somewhat difficult      Relevant past medical, surgical, family and social history reviewed and updated as indicated.  Interim medical history since our last visit reviewed. Allergies and medications reviewed and updated.  ROS:  Review of Systems  Constitutional:  Negative for fever.  Respiratory:  Negative for shortness of breath.   Cardiovascular:  Negative for chest pain.  Musculoskeletal:  Negative for arthralgias.  Skin:  Negative for rash.     Social History   Tobacco Use  Smoking Status Never  Smokeless Tobacco Never       Objective:      Wt Readings from Last 3 Encounters:  04/21/23 209 lb (94.8 kg)  04/14/23 208 lb (94.3 kg)  02/16/23 207 lb 12.8 oz (94.3 kg)     Exam deferred. Video visit performed.   Assessment & Plan:  Cystitis  Other orders -     Ciprofloxacin  HCl; Take 1 tablet (500 mg total) by mouth 2 (two) times daily.  Dispense: 20 tablet; Refill: 0      Diagnoses and all orders for this visit:  Cystitis  Other orders -      ciprofloxacin  (CIPRO ) 500 MG tablet; Take 1 tablet (500 mg total) by mouth 2 (two) times daily.    Virtual Visit  Note  I discussed the limitations, risks, security and privacy concerns of performing an evaluation and management service by video and the availability of in person appointments. The patient was identified with two identifiers. Pt.expressed understanding and agreed to proceed. Pt. Is at home. Dr. Veleta Gerold is in his office.  Follow Up Instructions:   I discussed the assessment and treatment plan with the patient. The patient was provided an opportunity to ask questions and all were answered. The patient agreed with the plan and demonstrated an understanding of the instructions.   The patient was advised to call back or seek an in-person evaluation if the symptoms worsen or if the condition fails to improve as anticipated.   Total minutes contact time: 14   Follow up plan: No follow-ups on file.  Roise Cleaver, MD [facility]

## 2023-06-15 ENCOUNTER — Ambulatory Visit: Admitting: Family Medicine

## 2023-06-16 ENCOUNTER — Encounter: Payer: Self-pay | Admitting: Neurology

## 2023-06-16 ENCOUNTER — Ambulatory Visit: Payer: BC Managed Care – PPO | Admitting: Neurology

## 2023-06-16 VITALS — BP 115/70 | HR 69 | Ht 75.0 in | Wt 215.0 lb

## 2023-06-16 DIAGNOSIS — R269 Unspecified abnormalities of gait and mobility: Secondary | ICD-10-CM | POA: Diagnosis not present

## 2023-06-16 DIAGNOSIS — R292 Abnormal reflex: Secondary | ICD-10-CM

## 2023-06-16 DIAGNOSIS — R261 Paralytic gait: Secondary | ICD-10-CM

## 2023-06-16 MED ORDER — BACLOFEN 10 MG PO TABS: ORAL_TABLET | ORAL | 1 refills | Status: DC

## 2023-06-16 NOTE — Patient Instructions (Signed)
ELECTROMYOGRAM AND NERVE CONDUCTION STUDIES (EMG/NCS) INSTRUCTIONS  How to Prepare The neurologist conducting the EMG will need to know if you have certain medical conditions. Tell the neurologist and other EMG lab personnel if you: . Have a pacemaker or any other electrical medical device . Take blood-thinning medications . Have hemophilia, a blood-clotting disorder that causes prolonged bleeding Bathing Take a shower or bath shortly before your exam in order to remove oils from your skin. Don't apply lotions or creams before the exam.  What to Expect You'll likely be asked to change into a hospital gown for the procedure and lie down on an examination table. The following explanations can help you understand what will happen during the exam.  . Electrodes. The neurologist or a technician places surface electrodes at various locations on your skin depending on where you're experiencing symptoms. Or the neurologist may insert needle electrodes at different sites depending on your symptoms.  . Sensations. The electrodes will at times transmit a tiny electrical current that you may feel as a twinge or spasm. The needle electrode may cause discomfort or pain that usually ends shortly after the needle is removed. If you are concerned about discomfort or pain, you may want to talk to the neurologist about taking a short break during the exam.  . Instructions. During the needle EMG, the neurologist will assess whether there is any spontaneous electrical activity when the muscle is at rest - activity that isn't present in healthy muscle tissue - and the degree of activity when you slightly contract the muscle.  He or she will give you instructions on resting and contracting a muscle at appropriate times. Depending on what muscles and nerves the neurologist is examining, he or she may ask you to change positions during the exam.  After your EMG You may experience some temporary, minor bruising where the  needle electrode was inserted into your muscle. This bruising should fade within several days. If it persists, contact your primary care doctor.    

## 2023-06-16 NOTE — Progress Notes (Signed)
 Follow-up Visit   Date: 06/16/2023    Luke Davila MRN: [id number] DOB: 08-01-1965    Luke Davila is a 58 y.o. right-handed male with right optic neuritis (2015), hyperlipidemia, BPH, and sarcoidosis returning to the clinic for follow-up of gait abnormality.  The patient was accompanied to the clinic by self.  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  MRI neuroaxis does not show structural pathology to explain his symptoms. I recommend NCS/EMG of the arms to assess segmental involvement.  Increase baclofen  10mg  in the morning, 10mg  in the afternoon, and 20mg  at bedtime.  I will see him back after testing to discuss findings.   --------------------------------------------- History of present illness: Starting around ~2018, he began having difficulty with walking, described has leg heaviness and stiffness.  Since then, he has progressive gait difficulty, especially with stretching the legs or straightening them out.  He tends to walk with his knees bent.  He has fallen a few times, none this year.  He has completed PT which transiently helps.  He denies weakness and walks unassisted.  He sometimes gets cramps in the lower legs.   Previously, he was evaluated at [facility] for right eye blindness in 2015 which was found to be optic neuritis.  Imaging of the brain and cervical spine was unrevealing.  He has seen orthooeadics and found to have left avascular necrosis of the hip for which he has had surgery.   More recently in April 2024, he had MRI cervical spine which showed foraminal stenosis at C3-4 and C4-5, no cord pathology.  He was referred for further evaluation.   UPDATE 04/21/2023:  He is here for follow-up visit.  He continues to have difficulty with walking and balance.  He has not had any new falls.  MRI brain and thoracic spine returned normal.  He denies weakness, numbness, or  tingling. No family history of similar condition.  UPDATE 06/16/2023:  He is here to discuss EMG results which shows active on chronic neurogenic changes involving the legs.  He continues to have sensation of leg heaviness and tightness.  His balance is poor.  He denies muscle twitches or arm weakness.   Medications:  Current Outpatient Medications on File Prior to Visit  Medication Sig Dispense Refill   albuterol  (VENTOLIN  HFA) 108 (90 Base) MCG/ACT inhaler Inhale 1-2 puffs into the lungs every 4 (four) hours as needed for wheezing or shortness of breath. 8.5 each 3   baclofen  (LIORESAL ) 10 MG tablet Take 1 tablet (10 mg total) by mouth 3 (three) times daily. 270 tablet 1   Budeson-Glycopyrrol-Formoterol (BREZTRI  AEROSPHERE) 160-9-4.8 MCG/ACT AERO Inhale 2 puffs into the lungs in the morning and at bedtime. 3 each 3   ciprofloxacin  (CIPRO ) 500 MG tablet Take 1 tablet (500 mg total) by mouth 2 (two) times daily. 20 tablet 0   folic acid  (FOLVITE ) 1 MG tablet Take 1 mg by mouth daily.     methotrexate  (RHEUMATREX) 2.5 MG tablet TAKE 6 TABLETS (15 MG TOTAL) BY MOUTH ONCE A WEEK. CAUTION:CHEMOTHERAPY. PROTECT FROM LIGHT. 72 tablet 2   rosuvastatin  (CRESTOR ) 10 MG tablet TAKE 1 TABLET BY MOUTH EVERY DAY 90 tablet 3   tadalafil (CIALIS) 5 MG tablet Take 5 mg by mouth daily as needed.     tamsulosin  (FLOMAX ) 0.4 MG CAPS capsule Take 0.4 mg by mouth daily after breakfast.     testosterone  cypionate (DEPOTESTOSTERONE  CYPIONATE) 200 MG/ML injection Inject 1 mL (200 mg total) into the muscle every 14 (fourteen) days. INJECT 1 ML (200 MG TOTAL) INTO THE MUSCLE EVERY 14 DAYS 6 mL 1   traZODone  (DESYREL ) 150 MG tablet USE FROM 1/3 TO 1 TABLET NIGHTLY AS NEEDED FOR SLEEP. 90 tablet 1   DENTA 5000 PLUS 1.1 % CREA dental cream Place 1 Application onto teeth as needed. (Patient not taking: Reported on 06/16/2023)     ferrous sulfate 324 MG TBEC Take 324 mg by mouth at bedtime. (Patient not taking: Reported on  06/16/2023)     prednisoLONE acetate (PRED FORTE) 1 % ophthalmic suspension Place 1 drop into both eyes every 2 (two) hours while awake. (Patient not taking: Reported on 06/16/2023)     predniSONE  (DELTASONE ) 20 MG tablet Take 1 tablet (20 mg total) by mouth daily with breakfast. (Patient taking differently: Take 20 mg by mouth daily with breakfast. As needed) 30 tablet 2   Current Facility-Administered Medications on File Prior to Visit  Medication Dose Route Frequency Provider Last Rate Last Admin   testosterone  cypionate (DEPOTESTOSTERONE CYPIONATE) injection 200 mg  200 mg Intramuscular Q14 Days Roise Cleaver, MD   200 mg at 06/08/23 1607    Allergies: No Known Allergies  Vital Signs:  BP 115/70   Pulse 69   Ht 6\' 3"  (1.905 m)   Wt 215 lb (97.5 kg)   SpO2 98%   BMI 26.87 kg/m    Neurological Exam: MENTAL STATUS including orientation to time, place, person, recent and remote memory, attention span and concentration, language, and fund of knowledge is normal.  Speech is not dysarthric.  CRANIAL NERVES:  No visual field defects.  Pupils equal round and reactive to light.  Normal conjugate, extra-ocular eye movements in all directions of gaze.  No ptosis.  Face is symmetric. Palate elevates symmetrically.  Tongue is midline.  MOTOR:  Motor strength is 5/5 in all extremities.  No atrophy, fasciculations or abnormal movements.  No pronator drift.  Tone is increased in the legs.    MSRs:  Reflexes are 2+/4 throughout, except 3+/4 at the knees bilaterally.  Plantars are down going.  SENSORY:  Intact to vibration throughout.  COORDINATION/GAIT:  Normal finger-to- nose-finger. Finger tapping intact.  Heel tapping is slowed.  He struggles to get out of the chair and pushes off with hands.  Gait is flexed at the knees and slightly stopped, mildly wide-based.    Data: NCS/EMG of the legs 05/29/2023: The electrophysiologic findings show active on chronic neurogenic changes involving the lower  extremities which is worse distally, which can be seen with motor neuropathy or disorder of anterior horn cells.  Recommend clinical correlation and repeat evaluation in 6-12 months.  Labs 10/27/2022:  GAD65 neg, SPEP with IFE neg, vitamin B1 10, vitamin B12 522, folate > 24, copper  174, zinc  64, vitamin E 12  MRI cervical spine 08/20/2022: Severe right foraminal stenosis and mild canal stenosis at C3-C4 and C4-C5.   MRI lumbar spine 02/02/2019: 1. Mild degenerative disc disease at L4-5 and L5-S1 with slight left foraminal narrowing at L4-5 and L5-S1. 2. No other significant abnormalities.  MRI thoracic spine 12/08/2022: 1. No abnormality seen to explain the clinical presentation. No cord compression or focal cord lesion. No sign of demyelinating disease in the thoracic spine. 2. Mild non-compressive disc bulges at T2-3 and T3-4. Minimal non-compressive disc bulges at T8-9 and T9-10. 3. Bilateral lung disease with areas of opacity and emphysematous change, not  primarily or completely evaluated. Known history of sarcoid.   MRI brain wo contrast 12/08/2022: No acute or reversible finding. Moderate chronic small-vessel ischemic changes of the pons and cerebral hemispheric white matter.   Thank you for allowing me to participate in patient's care.  If I can answer any additional questions, I would be pleased to do so.    Sincerely,    Clif Serio K. Lydia Sams, DO

## 2023-06-22 ENCOUNTER — Ambulatory Visit: Payer: Self-pay | Admitting: Pulmonary Disease

## 2023-06-22 DIAGNOSIS — D860 Sarcoidosis of lung: Secondary | ICD-10-CM

## 2023-06-23 ENCOUNTER — Ambulatory Visit (INDEPENDENT_AMBULATORY_CARE_PROVIDER_SITE_OTHER)

## 2023-06-23 ENCOUNTER — Other Ambulatory Visit

## 2023-06-23 ENCOUNTER — Telehealth: Payer: Self-pay

## 2023-06-23 ENCOUNTER — Other Ambulatory Visit: Payer: Self-pay

## 2023-06-23 DIAGNOSIS — N1832 Chronic kidney disease, stage 3b: Secondary | ICD-10-CM

## 2023-06-23 DIAGNOSIS — E291 Testicular hypofunction: Secondary | ICD-10-CM

## 2023-06-23 NOTE — Progress Notes (Signed)
 Patient is in office today for a nurse visit for Testosterone Injection. Patient Injection was given in the  Right upper quad. gluteus. Patient tolerated injection well.

## 2023-06-23 NOTE — Telephone Encounter (Signed)
 Copied from CRM [id number]. Topic: Clinical - Request for Lab/Test Order >> Jun 23, 2023  9:36 AM Zipporah Him wrote: Reason for CRM: patient wanting to get labs done today when he comes in for his injection requests an order be placed

## 2023-06-23 NOTE — Telephone Encounter (Signed)
 BMP already placed. LS

## 2023-06-24 LAB — BMP8+EGFR
BUN/Creatinine Ratio: 10 (ref 9–20)
BUN: 11 mg/dL (ref 6–24)
CO2: 22 mmol/L (ref 20–29)
Calcium: 9.2 mg/dL (ref 8.7–10.2)
Chloride: 96 mmol/L (ref 96–106)
Creatinine, Ser: 1.1 mg/dL (ref 0.76–1.27)
Glucose: 76 mg/dL (ref 70–99)
Potassium: 4.2 mmol/L (ref 3.5–5.2)
Sodium: 135 mmol/L (ref 134–144)
eGFR: 78 mL/min/{1.73_m2} (ref >59)

## 2023-06-25 ENCOUNTER — Ambulatory Visit: Payer: Self-pay | Admitting: Family Medicine

## 2023-06-25 ENCOUNTER — Ambulatory Visit: Payer: Self-pay

## 2023-06-25 NOTE — Progress Notes (Signed)
Hello [name],  Your lab result is normal and/or stable.Some minor variations that are not significant are commonly marked abnormal, but do not represent any medical problem for you.  Best regards, Dontarious Schaum, M.D.

## 2023-06-25 NOTE — Telephone Encounter (Signed)
 FYI Only or Action Required?: Action required by provider: clinical question for provider.  Patient was last seen in primary care on 06/09/2023 by Roise Cleaver, MD. Called Nurse Triage reporting Foot Pain. Symptoms began several days ago. Pain left foot, bottom and heel of foot. No injury. Interventions attempted: OTC medications: Naproxen  . Symptoms are: stable.  Triage Disposition: See Physician Within 24 Hours  Patient/caregiver understands and will follow disposition?: Yes       Copied from CRM [id number]. Topic: Clinical - Red Word Triage >> Jun 25, 2023 12:32 PM Tiffany H wrote: Red Word that prompted transfer to Nurse Triage: Patient called to advise that he has gout now in his heel. Patient advised that he stood up the other day and fell down. His balance has been compromised by the gout. Please assist. Patient would like to be seen. Reason for Disposition  [1] Swollen foot AND [2] no fever  (Exceptions: localized bump from bunions, calluses, insect bite, sting)  Answer Assessment - Initial Assessment Questions 1. ONSET: When did the pain start?      5 days ago 2. LOCATION: Where is the pain located?      Left foot 3. PAIN: How bad is the pain?    (Scale 1-10; or mild, moderate, severe)  - MILD (1-3): doesn't interfere with normal activities.   - MODERATE (4-7): interferes with normal activities (e.g., work or school) or awakens from sleep, limping.   - SEVERE (8-10): excruciating pain, unable to do any normal activities, unable to walk.      8 4. WORK OR EXERCISE: Has there been any recent work or exercise that involved this part of the body?      no 5. CAUSE: What do you think is causing the foot pain?     Gout 6. OTHER SYMPTOMS: Do you have any other symptoms? (e.g., leg pain, rash, fever, numbness)     no 7. PREGNANCY: Is there any chance you are pregnant? When was your last menstrual period?     N/a  Protocols used: Foot Pain-A-AH

## 2023-06-26 ENCOUNTER — Ambulatory Visit: Admitting: Nurse Practitioner

## 2023-06-26 ENCOUNTER — Encounter: Payer: Self-pay | Admitting: Nurse Practitioner

## 2023-06-26 VITALS — BP 131/72 | HR 67 | Temp 97.6°F | Ht 75.0 in | Wt 214.0 lb

## 2023-06-26 DIAGNOSIS — M25472 Effusion, left ankle: Secondary | ICD-10-CM | POA: Diagnosis not present

## 2023-06-26 DIAGNOSIS — Z8739 Personal history of other diseases of the musculoskeletal system and connective tissue: Secondary | ICD-10-CM

## 2023-06-26 DIAGNOSIS — M79672 Pain in left foot: Secondary | ICD-10-CM | POA: Diagnosis not present

## 2023-06-26 MED ORDER — PREDNISONE 20 MG PO TABS: 40.0000 mg | ORAL_TABLET | Freq: Every day | ORAL | 0 refills | Status: AC

## 2023-06-26 NOTE — Progress Notes (Signed)
   Subjective:    Patient ID: Luke Davila, male    DOB: 09-22-1965, 58 y.o.   MRN: [id number]   Chief Complaint: Left foot swollen (Thinks it may be gout)   HPI  Patient in c/o left foot pain for about 1 1/2 weeks. Swollen and very tender to touch. Mainly on his heel. Cannot bear weight on it. Has history of gout and feels the same to him. Rates pain 7-8/10. Standing increases pain. Patient Active Problem List   Diagnosis Date Noted   Left lower quadrant pain 10/08/2022   History of anemia due to CKD 02/07/2022   Pulmonary fibrosis (HCC) 11/27/2020   Atherosclerosis of aorta (HCC) 11/27/2020   Atherosclerosis of right carotid artery 11/27/2020   BPH (benign prostatic hyperplasia) 10/05/2020   Obstructive uropathy 10/05/2020   AKI (acute kidney injury) (HCC) 10/05/2020   Sarcoidosis of lung (HCC) 09/30/2019   Primary insomnia 02/14/2019   Bilateral primary osteoarthritis of knee 03/24/2018   PVC's (premature ventricular contractions) 02/03/2018   Hyperlipidemia 02/03/2018   Hyponatremia 08/06/2016   Constipation 10/29/2015   OA (osteoarthritis) of hip 04/09/2015   Hypogonadism in male 09/12/2014   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 09/29/2013   Visual field loss 09/29/2013   Erectile dysfunction 06/25/2012   Gout 06/25/2012       Review of Systems     Objective:   Physical Exam  Cardiovascular:     Rate and Rhythm: Normal rate and regular rhythm.     Heart sounds: Normal heart sounds.  Pulmonary:     Breath sounds: Normal breath sounds.   Musculoskeletal:     Comments: Left ankle edema with pain laterally on palpation Mild pain on palpation of heel.   Neurological:     General: No focal deficit present.     Mental Status: He is oriented to person, place, and time.   Psychiatric:        Mood and Affect: Mood normal.        Behavior: Behavior normal.     BP 131/72   Pulse 67   Temp 97.6 F (36.4 C) (Temporal)   Ht 6' 3 (1.905 m)   Wt 214 lb (97.1  kg)   SpO2 98%   BMI 26.75 kg/m        Assessment & Plan:   Luke Davila in today with chief complaint of Left foot swollen (Thinks it may be gout)   1. Intractable left heel pain (Primary) Ice elevate - predniSONE  (DELTASONE ) 20 MG tablet; Take 2 tablets (40 mg total) by mouth daily with breakfast for 5 days. 2 po daily for 5 days  Dispense: 10 tablet; Refill: 0  2. Edema of left ankle Elevate when sitting  3. Hx of gout Labs pending - Arthritis Panel    The above assessment and management plan was discussed with the patient. The patient verbalized understanding of and has agreed to the management plan. Patient is aware to call the clinic if symptoms persist or worsen. Patient is aware when to return to the clinic for a follow-up visit. Patient educated on when it is appropriate to go to the emergency department.   Mary-Margaret Gaylyn Keas, FNP

## 2023-06-26 NOTE — Patient Instructions (Signed)
 Gout  Gout is painful swelling of your joints. Gout is a type of arthritis. It is caused by having too much uric acid in your body. Uric acid is a chemical that is made when your body breaks down substances called purines. If your body has too much uric acid, sharp crystals can form and build up in your joints. This causes pain and swelling. Gout attacks can happen quickly and be very painful (acute gout). Over time, the attacks can affect more joints and happen more often (chronic gout). What are the causes? Gout is caused by too much uric acid in your blood. This can happen because: Your kidneys do not remove enough uric acid from your blood. Your body makes too much uric acid. You eat too many foods that are high in purines. These foods include organ meats, some seafood, and beer. Trauma or stress can bring on an attack. What increases the risk? Having a family history of gout. Being male and middle-aged. Being male and having gone through menopause. Having an organ transplant. Taking certain medicines. Having certain conditions, such as: Being very overweight (obese). Lead poisoning. Kidney disease. A skin condition called psoriasis. Other risks include: Losing weight too quickly. Not having enough water in the body (being dehydrated). Drinking alcohol, especially beer. Drinking beverages that are sweetened with a type of sugar called fructose. What are the signs or symptoms? An attack of acute gout often starts at night and usually happens in just one joint. The most common place is the big toe. Other joints that may be affected include joints of the feet, ankle, knee, fingers, wrist, or elbow. Symptoms may include: Very bad pain. Warmth. Swelling. Stiffness. Tenderness. The affected joint may be very painful to touch. Shiny, red, or purple skin. Chills and fever. Chronic gout may cause symptoms more often. More joints may be involved. You may also have white or yellow lumps  (tophi) on your hands or feet or in other areas near your joints. How is this treated? Treatment for an acute attack may include medicines for pain and swelling, such as: NSAIDs, such as ibuprofen. Steroids taken by mouth or injected into a joint. Colchicine. This can be given by mouth or through an IV tube. Treatment to prevent future attacks may include: Taking small doses of NSAIDs or colchicine daily. Using a medicine that reduces uric acid levels in your blood, such as allopurinol. Making changes to your diet. You may need to see a food expert (dietitian) about what to eat and drink to prevent gout. Follow these instructions at home: During a gout attack  If told, put ice on the painful area. To do this: Put ice in a plastic bag. Place a towel between your skin and the bag. Leave the ice on for 20 minutes, 2-3 times a day. Take off the ice if your skin turns bright red. This is very important. If you cannot feel pain, heat, or cold, you have a greater risk of damage to the area. Raise the painful joint above the level of your heart as often as you can. Rest the joint as much as possible. If the joint is in your leg, you may be given crutches. Follow instructions from your doctor about what you cannot eat or drink. Avoiding future gout attacks Eat a low-purine diet. Avoid foods and drinks such as: Liver. Kidney. Anchovies. Asparagus. Herring. Mushrooms. Mussels. Beer. Stay at a healthy weight. If you want to lose weight, talk with your doctor. Do not  lose weight too fast. Start or continue an exercise plan as told by your doctor. Eating and drinking Avoid drinks sweetened by fructose. Drink enough fluids to keep your pee (urine) pale yellow. If you drink alcohol: Limit how much you have to: 0-1 drink a day for women who are not pregnant. 0-2 drinks a day for men. Know how much alcohol is in a drink. In the [place] one drink equals one 12 oz bottle of beer (355 mL), one 5 oz  glass of wine (148 mL), or one 1 oz glass of hard liquor (44 mL). General instructions Take over-the-counter and prescription medicines only as told by your doctor. Ask your doctor if you should avoid driving or using machines while you are taking your medicine. Return to your normal activities when your doctor says that it is safe. Keep all follow-up visits. Where to find more information [employer]: www.niams.http://www.myers.net/ Contact a doctor if: You have another gout attack. You still have symptoms of a gout attack after 10 days of treatment. You have problems (side effects) because of your medicines. You have chills or a fever. You have burning pain when you pee (urinate). You have pain in your lower back or belly. Get help right away if: You have very bad pain. Your pain cannot be controlled. You cannot pee. Summary Gout is painful swelling of the joints. The most common site of pain is the big toe, but it can affect other joints. Medicines and avoiding some foods can help to prevent and treat gout attacks. This information is not intended to replace advice given to you by your health care provider. Make sure you discuss any questions you have with your health care provider. Document Revised: 09/26/2020 Document Reviewed: 09/26/2020 Elsevier Patient Education  2024 [employer].

## 2023-06-27 LAB — ARTHRITIS PANEL
Basophils Absolute: 0.1 10*3/uL (ref 0.0–0.2)
Basos: 1 %
EOS (ABSOLUTE): 0.3 10*3/uL (ref 0.0–0.4)
Eos: 7 %
Hematocrit: 35 % — ABNORMAL LOW (ref 37.5–51.0)
Hemoglobin: 11 g/dL — ABNORMAL LOW (ref 13.0–17.7)
Immature Grans (Abs): 0 10*3/uL (ref 0.0–0.1)
Immature Granulocytes: 0 %
Lymphocytes Absolute: 0.8 10*3/uL (ref 0.7–3.1)
Lymphs: 21 %
MCH: 26.6 pg (ref 26.6–33.0)
MCHC: 31.4 g/dL — ABNORMAL LOW (ref 31.5–35.7)
MCV: 85 fL (ref 79–97)
Monocytes Absolute: 0.5 10*3/uL (ref 0.1–0.9)
Monocytes: 14 %
Neutrophils Absolute: 2.1 10*3/uL (ref 1.4–7.0)
Neutrophils: 57 %
Platelets: 256 10*3/uL (ref 150–450)
RBC: 4.14 x10E6/uL (ref 4.14–5.80)
RDW: 13.3 % (ref 11.6–15.4)
Rheumatoid fact SerPl-aCnc: <10 [IU]/mL (ref <14.0)
Sed Rate: 49 mm/h — ABNORMAL HIGH (ref 0–30)
Uric Acid: 6.4 mg/dL (ref 3.8–8.4)
WBC: 3.8 10*3/uL (ref 3.4–10.8)

## 2023-06-30 ENCOUNTER — Ambulatory Visit: Payer: Self-pay | Admitting: Nurse Practitioner

## 2023-07-01 ENCOUNTER — Telehealth: Payer: Self-pay

## 2023-07-01 NOTE — Telephone Encounter (Signed)
 Copied from CRM [id number]. Topic: Clinical - Lab/Test Results >> Jul 01, 2023 11:59 AM Luke Davila wrote: Reason for CRM: read results verbatim, patient states he is feeling better.

## 2023-07-03 ENCOUNTER — Encounter: Admitting: Neurology

## 2023-07-07 ENCOUNTER — Ambulatory Visit

## 2023-07-07 ENCOUNTER — Other Ambulatory Visit

## 2023-07-07 DIAGNOSIS — N401 Enlarged prostate with lower urinary tract symptoms: Secondary | ICD-10-CM | POA: Diagnosis not present

## 2023-07-07 DIAGNOSIS — E291 Testicular hypofunction: Secondary | ICD-10-CM | POA: Diagnosis not present

## 2023-07-07 DIAGNOSIS — N3941 Urge incontinence: Secondary | ICD-10-CM | POA: Diagnosis not present

## 2023-07-07 DIAGNOSIS — R338 Other retention of urine: Secondary | ICD-10-CM | POA: Diagnosis not present

## 2023-07-07 DIAGNOSIS — N1832 Chronic kidney disease, stage 3b: Secondary | ICD-10-CM | POA: Diagnosis not present

## 2023-07-07 LAB — BASIC METABOLIC PANEL WITH GFR
BUN/Creatinine Ratio: 10 (ref 9–20)
BUN: 11 mg/dL (ref 6–24)
CO2: 23 mmol/L (ref 20–29)
Calcium: 8.8 mg/dL (ref 8.7–10.2)
Chloride: 99 mmol/L (ref 96–106)
Creatinine, Ser: 1.15 mg/dL (ref 0.76–1.27)
Glucose: 80 mg/dL (ref 70–99)
Potassium: 4.3 mmol/L (ref 3.5–5.2)
Sodium: 136 mmol/L (ref 134–144)
eGFR: 74 mL/min/{1.73_m2} (ref >59)

## 2023-07-07 NOTE — Progress Notes (Signed)
 Patient is in office today for a nurse visit for Testosterone Injection. Patient Injection was given in the  Left upper quad. gluteus. Patient tolerated injection well.

## 2023-07-16 ENCOUNTER — Ambulatory Visit: Payer: Self-pay | Admitting: Family Medicine

## 2023-07-16 NOTE — Progress Notes (Signed)
Hello Luke Davila,  Your lab result is normal and/or stable.Some minor variations that are not significant are commonly marked abnormal, but do not represent any medical problem for you.  Best regards, Dontarious Schaum, M.D.

## 2023-07-21 DIAGNOSIS — H47293 Other optic atrophy, bilateral: Secondary | ICD-10-CM | POA: Diagnosis not present

## 2023-07-21 DIAGNOSIS — H40023 Open angle with borderline findings, high risk, bilateral: Secondary | ICD-10-CM | POA: Diagnosis not present

## 2023-07-21 DIAGNOSIS — D8683 Sarcoid iridocyclitis: Secondary | ICD-10-CM | POA: Diagnosis not present

## 2023-07-22 ENCOUNTER — Telehealth: Payer: Self-pay

## 2023-07-22 NOTE — Telephone Encounter (Signed)
 Copied from CRM [id number]. Topic: Appointments - Scheduling Inquiry for Clinic >> Jul 22, 2023 10:44 AM Rosaria BRAVO wrote: Reason for CRM: Pt wants testosterone  injection today and labs  Best contact: [phone]

## 2023-07-22 NOTE — Telephone Encounter (Signed)
 Returned patient call, E2C2 scheduled him for tomorrow at 10:30 am. Patient states that tomorrow works fine for him and he would like to keep that appointment.

## 2023-07-23 ENCOUNTER — Other Ambulatory Visit (INDEPENDENT_AMBULATORY_CARE_PROVIDER_SITE_OTHER)

## 2023-07-23 ENCOUNTER — Ambulatory Visit (INDEPENDENT_AMBULATORY_CARE_PROVIDER_SITE_OTHER)

## 2023-07-23 DIAGNOSIS — N1832 Chronic kidney disease, stage 3b: Secondary | ICD-10-CM

## 2023-07-23 DIAGNOSIS — M25551 Pain in right hip: Secondary | ICD-10-CM | POA: Insufficient documentation

## 2023-07-23 DIAGNOSIS — Z96642 Presence of left artificial hip joint: Secondary | ICD-10-CM | POA: Diagnosis not present

## 2023-07-23 DIAGNOSIS — E291 Testicular hypofunction: Secondary | ICD-10-CM | POA: Diagnosis not present

## 2023-07-23 NOTE — Progress Notes (Signed)
 Patient is in office today for a nurse visit for Testosterone Injection. Patient Injection was given in the  Right upper quad. gluteus. Patient tolerated injection well.

## 2023-07-24 LAB — BMP8+EGFR
BUN/Creatinine Ratio: 11 (ref 9–20)
BUN: 12 mg/dL (ref 6–24)
CO2: 23 mmol/L (ref 20–29)
Calcium: 9.3 mg/dL (ref 8.7–10.2)
Chloride: 93 mmol/L — ABNORMAL LOW (ref 96–106)
Creatinine, Ser: 1.07 mg/dL (ref 0.76–1.27)
Glucose: 85 mg/dL (ref 70–99)
Potassium: 4.3 mmol/L (ref 3.5–5.2)
Sodium: 131 mmol/L — ABNORMAL LOW (ref 134–144)
eGFR: 81 mL/min/1.73 (ref >59)

## 2023-07-26 ENCOUNTER — Other Ambulatory Visit: Payer: Self-pay | Admitting: Family Medicine

## 2023-07-26 ENCOUNTER — Ambulatory Visit: Payer: Self-pay | Admitting: Family Medicine

## 2023-08-03 ENCOUNTER — Ambulatory Visit: Admitting: Pulmonary Disease

## 2023-08-03 DIAGNOSIS — D860 Sarcoidosis of lung: Secondary | ICD-10-CM

## 2023-08-03 LAB — PULMONARY FUNCTION TEST
DL/VA % pred: 96 %
DL/VA: 4.08 ml/min/mmHg/L
DLCO unc % pred: 50 %
DLCO unc: 15.94 ml/min/mmHg
FEF 25-75 Post: 2.15 L/s
FEF 25-75 Pre: 1.86 L/s
FEF2575-%Change-Post: 15 %
FEF2575-%Pred-Post: 61 %
FEF2575-%Pred-Pre: 53 %
FEV1-%Change-Post: 4 %
FEV1-%Pred-Post: 54 %
FEV1-%Pred-Pre: 52 %
FEV1-Post: 2.28 L
FEV1-Pre: 2.19 L
FEV1FVC-%Change-Post: 2 %
FEV1FVC-%Pred-Pre: 101 %
FEV6-%Change-Post: 1 %
FEV6-%Pred-Post: 54 %
FEV6-%Pred-Pre: 53 %
FEV6-Post: 2.87 L
FEV6-Pre: 2.82 L
FEV6FVC-%Change-Post: 0 %
FEV6FVC-%Pred-Post: 103 %
FEV6FVC-%Pred-Pre: 104 %
FVC-%Change-Post: 1 %
FVC-%Pred-Post: 52 %
FVC-%Pred-Pre: 51 %
FVC-Post: 2.88 L
FVC-Pre: 2.84 L
Post FEV1/FVC ratio: 79 %
Post FEV6/FVC ratio: 100 %
Pre FEV1/FVC ratio: 77 %
Pre FEV6/FVC Ratio: 100 %
RV % pred: 60 %
RV: 1.44 L
TLC % pred: 54 %
TLC: 4.21 L

## 2023-08-03 NOTE — Progress Notes (Signed)
 Full pft performed today.

## 2023-08-03 NOTE — Patient Instructions (Signed)
 Full pft performed today.

## 2023-08-04 ENCOUNTER — Ambulatory Visit

## 2023-08-04 ENCOUNTER — Ambulatory Visit (HOSPITAL_COMMUNITY)
Admission: RE | Admit: 2023-08-04 | Discharge: 2023-08-04 | Disposition: A | Discharge status: Home / Self Care | Source: Ambulatory Visit | Attending: Pulmonary Disease

## 2023-08-04 DIAGNOSIS — I493 Ventricular premature depolarization: Secondary | ICD-10-CM | POA: Diagnosis not present

## 2023-08-04 DIAGNOSIS — I272 Pulmonary hypertension, unspecified: Secondary | ICD-10-CM | POA: Diagnosis not present

## 2023-08-04 DIAGNOSIS — I342 Nonrheumatic mitral (valve) stenosis: Secondary | ICD-10-CM

## 2023-08-04 DIAGNOSIS — E785 Hyperlipidemia, unspecified: Secondary | ICD-10-CM | POA: Diagnosis not present

## 2023-08-04 DIAGNOSIS — D860 Sarcoidosis of lung: Secondary | ICD-10-CM

## 2023-08-04 DIAGNOSIS — I052 Rheumatic mitral stenosis with insufficiency: Secondary | ICD-10-CM | POA: Diagnosis not present

## 2023-08-04 DIAGNOSIS — D869 Sarcoidosis, unspecified: Secondary | ICD-10-CM | POA: Insufficient documentation

## 2023-08-06 ENCOUNTER — Ambulatory Visit: Payer: Self-pay | Admitting: Pulmonary Disease

## 2023-08-11 ENCOUNTER — Other Ambulatory Visit

## 2023-08-11 ENCOUNTER — Ambulatory Visit (INDEPENDENT_AMBULATORY_CARE_PROVIDER_SITE_OTHER)

## 2023-08-11 DIAGNOSIS — E291 Testicular hypofunction: Secondary | ICD-10-CM

## 2023-08-11 DIAGNOSIS — N1832 Chronic kidney disease, stage 3b: Secondary | ICD-10-CM

## 2023-08-11 LAB — BMP8+EGFR
BUN/Creatinine Ratio: 10 (ref 9–20)
BUN: 13 mg/dL (ref 6–24)
CO2: 23 mmol/L (ref 20–29)
Calcium: 9.3 mg/dL (ref 8.7–10.2)
Chloride: 98 mmol/L (ref 96–106)
Creatinine, Ser: 1.32 mg/dL — ABNORMAL HIGH (ref 0.76–1.27)
Glucose: 80 mg/dL (ref 70–99)
Potassium: 3.9 mmol/L (ref 3.5–5.2)
Sodium: 136 mmol/L (ref 134–144)
eGFR: 63 mL/min/1.73 (ref >59)

## 2023-08-11 NOTE — Progress Notes (Signed)
 Patient is in office today for a nurse visit for Testosterone  Injection. Patient Injection was given in the  Left upper quad. gluteus. Patient tolerated injection well.   Patient wants to know when levels should be checked - last done 04/14/26

## 2023-08-12 ENCOUNTER — Ambulatory Visit: Payer: Self-pay | Admitting: Family Medicine

## 2023-08-13 ENCOUNTER — Encounter: Admitting: Neurology

## 2023-08-17 DIAGNOSIS — M25551 Pain in right hip: Secondary | ICD-10-CM | POA: Diagnosis not present

## 2023-08-19 ENCOUNTER — Other Ambulatory Visit: Payer: Self-pay | Admitting: Pulmonary Disease

## 2023-08-19 DIAGNOSIS — R339 Retention of urine, unspecified: Secondary | ICD-10-CM | POA: Diagnosis not present

## 2023-08-28 DIAGNOSIS — M879 Osteonecrosis, unspecified: Secondary | ICD-10-CM | POA: Insufficient documentation

## 2023-08-28 DIAGNOSIS — M25551 Pain in right hip: Secondary | ICD-10-CM | POA: Diagnosis not present

## 2023-08-28 DIAGNOSIS — Z96642 Presence of left artificial hip joint: Secondary | ICD-10-CM | POA: Diagnosis not present

## 2023-08-28 DIAGNOSIS — M87051 Idiopathic aseptic necrosis of right femur: Secondary | ICD-10-CM | POA: Diagnosis not present

## 2023-09-01 ENCOUNTER — Ambulatory Visit: Admitting: Pulmonary Disease

## 2023-09-01 ENCOUNTER — Encounter: Payer: Self-pay | Admitting: Pulmonary Disease

## 2023-09-01 VITALS — BP 122/74 | HR 64 | Temp 97.7°F | Ht 75.0 in | Wt 213.0 lb

## 2023-09-01 DIAGNOSIS — D860 Sarcoidosis of lung: Secondary | ICD-10-CM

## 2023-09-01 DIAGNOSIS — Z5181 Encounter for therapeutic drug level monitoring: Secondary | ICD-10-CM

## 2023-09-01 DIAGNOSIS — J8410 Pulmonary fibrosis, unspecified: Secondary | ICD-10-CM | POA: Diagnosis not present

## 2023-09-01 LAB — COMPREHENSIVE METABOLIC PANEL WITH GFR
ALT: 19 U/L (ref 0–53)
AST: 35 U/L (ref 0–37)
Albumin: 4.4 g/dL (ref 3.5–5.2)
Alkaline Phosphatase: 116 U/L (ref 39–117)
BUN: 15 mg/dL (ref 6–23)
CO2: 25 meq/L (ref 19–32)
Calcium: 9.4 mg/dL (ref 8.4–10.5)
Chloride: 98 meq/L (ref 96–112)
Creatinine, Ser: 1.29 mg/dL (ref 0.40–1.50)
GFR: 61.34 mL/min (ref >60.00)
Glucose, Bld: 71 mg/dL (ref 70–99)
Potassium: 3.8 meq/L (ref 3.5–5.1)
Sodium: 134 meq/L — ABNORMAL LOW (ref 135–145)
Total Bilirubin: 1.3 mg/dL — ABNORMAL HIGH (ref 0.2–1.2)
Total Protein: 7.8 g/dL (ref 6.0–8.3)

## 2023-09-01 LAB — CBC WITH DIFFERENTIAL/PLATELET
Basophils Absolute: 0 K/uL (ref 0.0–0.1)
Basophils Relative: 1.2 % (ref 0.0–3.0)
Eosinophils Absolute: 0.3 K/uL (ref 0.0–0.7)
Eosinophils Relative: 7.8 % — ABNORMAL HIGH (ref 0.0–5.0)
HCT: 39.3 % (ref 39.0–52.0)
Hemoglobin: 12.8 g/dL — ABNORMAL LOW (ref 13.0–17.0)
Lymphocytes Relative: 23.9 % (ref 12.0–46.0)
Lymphs Abs: 0.9 K/uL (ref 0.7–4.0)
MCHC: 32.6 g/dL (ref 30.0–36.0)
MCV: 80.8 fl (ref 78.0–100.0)
Monocytes Absolute: 0.4 K/uL (ref 0.1–1.0)
Monocytes Relative: 9.5 % (ref 3.0–12.0)
Neutro Abs: 2.3 K/uL (ref 1.4–7.7)
Neutrophils Relative %: 57.6 % (ref 43.0–77.0)
Platelets: 215 K/uL (ref 150.0–400.0)
RBC: 4.86 Mil/uL (ref 4.22–5.81)
RDW: 14.8 % (ref 11.5–15.5)
WBC: 4 K/uL (ref 4.0–10.5)

## 2023-09-01 MED ORDER — ALBUTEROL SULFATE HFA 108 (90 BASE) MCG/ACT IN AERS: 1.0000 | INHALATION_SPRAY | RESPIRATORY_TRACT | 3 refills | Status: DC | PRN

## 2023-09-01 NOTE — Progress Notes (Signed)
 Luke Davila    [id number]    26-Dec-1965  Primary Care Physician:Stacks, Butler, MD  Referring Physician: Christobal Morado, MD [address] [place],  [place] [address]  Chief complaint:  Follow-up for Sarcoidosis Started methotrexate  in Aug 2023 Prednisone  tapered off October 2023 by October  HPI: 58 y.o. with history of interstitial lung disease, CKD, HTN.  Originally seen as a consult in June 2023 with abnormal CT showing upper lung predominant changes.  This was initially noted in 2021 and was told he had sarcoidosis based on the pattern of interstitial lung disease.  He never had a biopsy done or any specific treatment given.  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 Has increasing dyspnea on exertion with flareups 2-3 times a year treated with prednisone  tapers.  He was started on breztri  in April 2023  He is here with his ex-wife who helps him with his healthcare needs and appointments  Underwent bronchoscopy with endobronchial ultrasound of lymph nodes on 07/17/21 with findings of granulomatous inflammation consistent with sarcoid.  Cultures are negative.  There is no evidence of malignancy He has been started on prednisone  at 40 mg a day at the end of July 2023 and was tapered off by October 2023  Interim history: Discussed the use of AI scribe software for clinical note transcription with the patient, who gave verbal consent to proceed.  History of Present Illness Luke Davila is a 58 year old male with sarcoidosis who presents for follow-up of his respiratory condition.  Dyspnea and cough - No current shortness of breath - Able to perform household tasks without dyspnea - Uses inhaler sporadically, sometimes going three to four days without use - Uses inhaler when experiencing coughing - No current cough reported  Inhaler and medication adherence - Uncertain about daily use of inhaler, initially  believed it was only necessary for episodes of shortness of breath - Has not been using inhaler daily - Breztri  inhaler was prescribed for obstruction possibly due to sarcoidosis-related inflammation - Needs refill of albuterol  inhaler, current supply depleted  Sarcoidosis management - Diagnosed with sarcoidosis in 2023 - Initially treated with prednisone , which caused weight gain - Currently on methotrexate  20 mg and folic acid  - Experienced flare-ups but has not used prednisone  recently, though keeps it available for such events - Weight loss attributed to discontinuation of prednisone   Planned orthopedic surgery and rehabilitation - Scheduled for hip replacement surgery in January 2026 - Anticipates need for rehabilitation following surgery  Relevant pulmonary history Pets: No pets Occupation: Works as a Network engineer Exposures: No mold, hot tub, Financial controller.  No feather pillows or comforter Smoking history: Never smoker Travel history: No significant travel his Relevant family history: No family history of lung disease  Outpatient Encounter Medications as of 09/01/2023  Medication Sig   albuterol  (VENTOLIN  HFA) 108 (90 Base) MCG/ACT inhaler Inhale 1-2 puffs into the lungs every 4 (four) hours as needed for wheezing or shortness of breath.   baclofen  (LIORESAL ) 10 MG tablet Take 1 tablet in the morning, 1 tablet in the afternoon, and 2 tablets at bedtime.   Budeson-Glycopyrrol-Formoterol (BREZTRI  AEROSPHERE) 160-9-4.8 MCG/ACT AERO Inhale 2 puffs into the lungs in the morning and at bedtime.   folic acid  (FOLVITE ) 1 MG tablet TAKE 1 TABLET BY MOUTH EVERY DAY   methotrexate  (RHEUMATREX) 2.5 MG tablet TAKE 6 TABLETS (15 MG TOTAL)  BY MOUTH ONCE A WEEK. CAUTION:CHEMOTHERAPY. PROTECT FROM LIGHT.   prednisoLONE acetate (PRED FORTE) 1 % ophthalmic suspension Place 1 drop into both eyes every 2 (two) hours while awake.   rosuvastatin  (CRESTOR ) 10 MG tablet TAKE 1 TABLET BY MOUTH EVERY DAY    tamsulosin  (FLOMAX ) 0.4 MG CAPS capsule Take 0.4 mg by mouth daily after breakfast.   testosterone  cypionate (DEPOTESTOSTERONE CYPIONATE) 200 MG/ML injection Inject 1 mL (200 mg total) into the muscle every 14 (fourteen) days. INJECT 1 ML (200 MG TOTAL) INTO THE MUSCLE EVERY 14 DAYS   tadalafil (CIALIS) 5 MG tablet Take 5 mg by mouth daily as needed. (Patient not taking: Reported on 09/01/2023)   traZODone  (DESYREL ) 150 MG tablet USE FROM 1/3 TO 1 TABLET NIGHTLY AS NEEDED FOR SLEEP. (Patient not taking: Reported on 09/01/2023)   Facility-Administered Encounter Medications as of 09/01/2023  Medication   testosterone  cypionate (DEPOTESTOSTERONE CYPIONATE) injection 200 mg    Vitals:   09/01/23 1120  BP: 122/74  Pulse: 64  Temp: 97.7 F (36.5 C)  Height: 6' 3 (1.905 m)  Weight: 213 lb (96.6 kg)  SpO2: 98%  TempSrc: Oral  BMI (Calculated): 26.62    Physical Exam GEN: No acute distress CV: Regular rate and rhythm no murmurs LUNGS: Clear to auscultation bilaterally normal respiratory effort SKIN JOINTS: Warm and dry no rash    Data Reviewed: Imaging: CT high-resolution 06/13/2020-unchanged pattern of pulmonary fibrosis with upper lung predominance with peribronchovascular opacity, bronchiolectasis, enlargement Instylan hilar lymph nodes  High-resolution CT 06/28/2021-pulmonary fibrosis in similar pattern with slight worsening of fibrotic changes.  Hilar, mediastinal lymphadenopathy  High resolution CT 02/19/2022-stable pattern of pulmonary fibrosis, changes of sarcoidosis  High resolution CT 05/27/2023-stable pattern of severe upper and midlung predominant peribronchovascular thickening, traction bronchiectasis.  Stable mediastinal adenopathy. I reviewed the images personally  PFTs: 06/17/2021 FVC 3.14 [69%], FEV1 2.40 [64%], F/F 76, TLC 4.36 [56%], DLCO 15.23 [47%] Moderate obstruction with bronchodilator response, severe restriction and diffusion impairment  01/20/2022 FVC 3.16  [57%], FEV1 2.42 [54%], TLC 4.64 [60%], DLCO 13.09 [41%] Moderate obstruction, severe restriction and diffusion impairment  08/03/2023 FVC 2.88 [52%], FEV1 2.28 [54%], F/F79, TLC 4.21 [54%], DLCO 15.94 [50%] Severe restriction, moderate diffusion impairment.  Improvement in obstruction  Labs: CTD serologies 06/10/2021-negative Angiotensin-converting enzyme 06/10/2021- 47  Bronchoscopy with endobronchial ultrasound biopsy 07/17/2021 Station 7 lymph node biopsy with granulomatous inflammation.  Lymph node cultures negative to date Assessment & Plan Sarcoidosis on immunosuppressive therapy Diagnosed in 2023, managed with methotrexate  and folic acid .  Finished prednisone  course in 2023 with no recent prednisone  taper dose needed recently for flare-ups. Improved lung function indicates effective management. - Continue methotrexate  20 mg and folic acid . - Check CMP and CBC for therapeutic drug monitoring - Ensure [facility]ills [facility] available through [facility]. - Order chest CT in May 2026 to monitor progression. - Keep up with annual ophthalmology visits.  Obstructive airway disease Previously had obstruction likely due to sarcoidosis-related inflammation. Improved lung function suggests reduced obstruction. Current use of Breztri  inhaler is unnecessary if no benefit is observed. - Discontinue Breztri  nhaler if no benefit is observed. - Prescribe albuterol  inhaler for use as needed for symptomatic relief.   Plan/Recommendations: Discontinue breztri  inhaler.  Albuterol  as needed Continue methotrexate , folic acid  Labs for monitoring CT in 6 months  Lonna Coder MD [facility] 09/01/2023, 11:39 AM  CC: Neelie Welshans, MD

## 2023-09-01 NOTE — Patient Instructions (Signed)
  VISIT SUMMARY: You had a follow-up appointment to discuss your respiratory condition related to sarcoidosis. We reviewed your symptoms, inhaler use, and overall management plan. We also discussed your upcoming hip replacement surgery and the need for rehabilitation afterward.  YOUR PLAN: -SARCOIDOSIS: Sarcoidosis is an inflammatory disease that affects multiple organs, particularly the lungs. You are currently managing it with methotrexate  and folic acid , and you have not needed prednisone  recently. Continue taking methotrexate  20 mg and folic acid  as prescribed. We will ensure [facility] and plan to order a chest CT in May 2026 to monitor your condition.  -OBSTRUCTIVE AIRWAY DISEASE: Obstructive airway disease involves the narrowing of the airways, which can cause breathing difficulties. Your lung function has improved, indicating reduced obstruction. If you do not find the Brexter inhaler beneficial, you can discontinue its use. You will be prescribed an albuterol  inhaler to use as needed for symptom relief.  INSTRUCTIONS: Please continue taking your methotrexate  and folic acid  as prescribed. Ensure you [facility]ills [facility]. Discontinue the Breztri  inhaler if it is not helping you. Use the albuterol  inhaler as needed for any breathing difficulties. We will schedule a chest CT in May 2026 to monitor your sarcoidosis. Additionally, prepare for your hip replacement surgery in January and the subsequent rehabilitation.

## 2023-09-09 ENCOUNTER — Encounter: Payer: Self-pay | Admitting: Family Medicine

## 2023-09-09 ENCOUNTER — Other Ambulatory Visit

## 2023-09-09 ENCOUNTER — Ambulatory Visit (INDEPENDENT_AMBULATORY_CARE_PROVIDER_SITE_OTHER)

## 2023-09-09 DIAGNOSIS — N1832 Chronic kidney disease, stage 3b: Secondary | ICD-10-CM

## 2023-09-09 DIAGNOSIS — E291 Testicular hypofunction: Secondary | ICD-10-CM | POA: Diagnosis not present

## 2023-09-09 NOTE — Progress Notes (Signed)
 Patient is in office today for a nurse visit for Testosterone Injection. Patient Injection was given in the  Right upper quad. gluteus. Patient tolerated injection well.

## 2023-09-10 ENCOUNTER — Ambulatory Visit: Payer: Self-pay | Admitting: Pulmonary Disease

## 2023-09-10 DIAGNOSIS — D860 Sarcoidosis of lung: Secondary | ICD-10-CM

## 2023-09-10 DIAGNOSIS — Z5181 Encounter for therapeutic drug level monitoring: Secondary | ICD-10-CM

## 2023-09-10 LAB — BMP8+EGFR
BUN/Creatinine Ratio: 11 (ref 9–20)
BUN: 12 mg/dL (ref 6–24)
CO2: 24 mmol/L (ref 20–29)
Calcium: 8.9 mg/dL (ref 8.7–10.2)
Chloride: 100 mmol/L (ref 96–106)
Creatinine, Ser: 1.1 mg/dL (ref 0.76–1.27)
Glucose: 80 mg/dL (ref 70–99)
Potassium: 3.8 mmol/L (ref 3.5–5.2)
Sodium: 138 mmol/L (ref 134–144)
eGFR: 78 mL/min/1.73 (ref >59)

## 2023-09-13 ENCOUNTER — Ambulatory Visit: Payer: Self-pay | Admitting: Family Medicine

## 2023-09-13 NOTE — Progress Notes (Signed)
Hello Luke Davila,  Your lab result is normal and/or stable.Some minor variations that are not significant are commonly marked abnormal, but do not represent any medical problem for you.  Best regards, Dontarious Schaum, M.D.

## 2023-09-14 NOTE — Telephone Encounter (Signed)
 HE will need an office visit to go over the goals of treatment, dx to support ETC.

## 2023-09-21 ENCOUNTER — Ambulatory Visit

## 2023-09-23 ENCOUNTER — Ambulatory Visit

## 2023-09-27 ENCOUNTER — Other Ambulatory Visit: Payer: Self-pay | Admitting: Family Medicine

## 2023-09-27 DIAGNOSIS — E291 Testicular hypofunction: Secondary | ICD-10-CM

## 2023-09-28 DIAGNOSIS — M6281 Muscle weakness (generalized): Secondary | ICD-10-CM | POA: Diagnosis not present

## 2023-09-28 DIAGNOSIS — R269 Unspecified abnormalities of gait and mobility: Secondary | ICD-10-CM | POA: Diagnosis not present

## 2023-09-28 DIAGNOSIS — M25551 Pain in right hip: Secondary | ICD-10-CM | POA: Diagnosis not present

## 2023-09-28 DIAGNOSIS — R293 Abnormal posture: Secondary | ICD-10-CM | POA: Diagnosis not present

## 2023-09-29 ENCOUNTER — Other Ambulatory Visit: Payer: Self-pay | Admitting: Pulmonary Disease

## 2023-09-29 ENCOUNTER — Other Ambulatory Visit

## 2023-09-29 ENCOUNTER — Ambulatory Visit: Admitting: Family Medicine

## 2023-09-29 ENCOUNTER — Telehealth: Payer: Self-pay

## 2023-09-29 ENCOUNTER — Ambulatory Visit: Admitting: *Deleted

## 2023-09-29 DIAGNOSIS — Z5181 Encounter for therapeutic drug level monitoring: Secondary | ICD-10-CM

## 2023-09-29 DIAGNOSIS — D860 Sarcoidosis of lung: Secondary | ICD-10-CM

## 2023-09-29 DIAGNOSIS — N1832 Chronic kidney disease, stage 3b: Secondary | ICD-10-CM

## 2023-09-29 DIAGNOSIS — E291 Testicular hypofunction: Secondary | ICD-10-CM

## 2023-09-29 NOTE — Progress Notes (Signed)
 Patient is in office today for a nurse visit for Testosterone  Injection. Patient Injection was given in the  Left upper quad. gluteus. Patient tolerated injection well.

## 2023-09-29 NOTE — Telephone Encounter (Signed)
 I called and spoke with patient and got him scheduled to get his testosterone  inj and lab work done today.

## 2023-09-29 NOTE — Telephone Encounter (Signed)
 Copied from CRM #[id number]. Topic: Clinical - Lab/Test Results >> Sep 29, 2023  9:47 AM Tiffany B wrote: Reason for CRM: Patient follow up appointment was cancelled today due to provider being sick, patient rescheduled to 10/19/2023. Patient stated he was suppose to receive his testosterone  shot and have labs done today. Patient would like to come in today to receive the shot and have labs done. As per CAL send a message.

## 2023-09-30 ENCOUNTER — Telehealth: Payer: Self-pay

## 2023-09-30 ENCOUNTER — Encounter (HOSPITAL_COMMUNITY): Payer: Self-pay

## 2023-09-30 ENCOUNTER — Other Ambulatory Visit (HOSPITAL_COMMUNITY): Payer: Self-pay

## 2023-09-30 DIAGNOSIS — R269 Unspecified abnormalities of gait and mobility: Secondary | ICD-10-CM | POA: Diagnosis not present

## 2023-09-30 DIAGNOSIS — M25551 Pain in right hip: Secondary | ICD-10-CM | POA: Diagnosis not present

## 2023-09-30 DIAGNOSIS — R293 Abnormal posture: Secondary | ICD-10-CM | POA: Diagnosis not present

## 2023-09-30 DIAGNOSIS — M6281 Muscle weakness (generalized): Secondary | ICD-10-CM | POA: Diagnosis not present

## 2023-09-30 LAB — BMP8+EGFR
BUN/Creatinine Ratio: 12 (ref 9–20)
BUN: 14 mg/dL (ref 6–24)
CO2: 23 mmol/L (ref 20–29)
Calcium: 9.4 mg/dL (ref 8.7–10.2)
Chloride: 98 mmol/L (ref 96–106)
Creatinine, Ser: 1.21 mg/dL (ref 0.76–1.27)
Glucose: 74 mg/dL (ref 70–99)
Potassium: 4.3 mmol/L (ref 3.5–5.2)
Sodium: 137 mmol/L (ref 134–144)
eGFR: 69 mL/min/1.73 (ref >59)

## 2023-09-30 NOTE — Telephone Encounter (Signed)
 Pharmacy Patient Advocate Encounter   Received notification from CoverMyMeds that prior authorization for Testosterone  Cypionate 200MG /ML intramuscular solution is due for renewal.   Insurance verification completed.   The patient is insured through [facility].  Action: PA required; PA submitted to above mentioned insurance via Latent Key/confirmation #/EOC [id number] Status is pending

## 2023-10-01 NOTE — Telephone Encounter (Signed)
 Pharmacy Patient Advocate Encounter  Received notification from [facility] that Prior Authorization for Testosterone  Cypionate 200MG /ML intramuscular solution  has been APPROVED from 10/01/2023 to 09/30/2024   PA #/Case ID/Reference #: [id number]

## 2023-10-02 ENCOUNTER — Ambulatory Visit: Payer: Self-pay | Admitting: Family Medicine

## 2023-10-02 NOTE — Progress Notes (Signed)
Hello [name],  Your lab result is normal and/or stable.Some minor variations that are not significant are commonly marked abnormal, but do not represent any medical problem for you.  Best regards, Dontarious Schaum, M.D.

## 2023-10-05 ENCOUNTER — Other Ambulatory Visit

## 2023-10-05 ENCOUNTER — Other Ambulatory Visit: Payer: Self-pay | Admitting: Nurse Practitioner

## 2023-10-05 ENCOUNTER — Other Ambulatory Visit: Payer: Self-pay | Admitting: Pulmonary Disease

## 2023-10-05 DIAGNOSIS — Z5181 Encounter for therapeutic drug level monitoring: Secondary | ICD-10-CM

## 2023-10-05 DIAGNOSIS — R1032 Left lower quadrant pain: Secondary | ICD-10-CM

## 2023-10-05 DIAGNOSIS — R3914 Feeling of incomplete bladder emptying: Secondary | ICD-10-CM | POA: Diagnosis not present

## 2023-10-05 DIAGNOSIS — R3915 Urgency of urination: Secondary | ICD-10-CM | POA: Diagnosis not present

## 2023-10-05 DIAGNOSIS — R350 Frequency of micturition: Secondary | ICD-10-CM | POA: Diagnosis not present

## 2023-10-05 DIAGNOSIS — K5900 Constipation, unspecified: Secondary | ICD-10-CM | POA: Diagnosis not present

## 2023-10-05 LAB — COMPREHENSIVE METABOLIC PANEL WITH GFR
ALT: 17 U/L (ref 0–53)
AST: 33 U/L (ref 0–37)
Albumin: 4.4 g/dL (ref 3.5–5.2)
Alkaline Phosphatase: 128 U/L — ABNORMAL HIGH (ref 39–117)
BUN: 13 mg/dL (ref 6–23)
CO2: 26 meq/L (ref 19–32)
Calcium: 9.1 mg/dL (ref 8.4–10.5)
Chloride: 96 meq/L (ref 96–112)
Creatinine, Ser: 1.06 mg/dL (ref 0.40–1.50)
GFR: 77.59 mL/min (ref >60.00)
Glucose, Bld: 64 mg/dL — ABNORMAL LOW (ref 70–99)
Potassium: 3.7 meq/L (ref 3.5–5.1)
Sodium: 130 meq/L — ABNORMAL LOW (ref 135–145)
Total Bilirubin: 1.3 mg/dL — ABNORMAL HIGH (ref 0.2–1.2)
Total Protein: 7 g/dL (ref 6.0–8.3)

## 2023-10-11 ENCOUNTER — Other Ambulatory Visit: Payer: Self-pay | Admitting: Pulmonary Disease

## 2023-10-13 DIAGNOSIS — M6281 Muscle weakness (generalized): Secondary | ICD-10-CM | POA: Diagnosis not present

## 2023-10-13 DIAGNOSIS — R269 Unspecified abnormalities of gait and mobility: Secondary | ICD-10-CM | POA: Diagnosis not present

## 2023-10-13 DIAGNOSIS — R293 Abnormal posture: Secondary | ICD-10-CM | POA: Diagnosis not present

## 2023-10-13 DIAGNOSIS — M25551 Pain in right hip: Secondary | ICD-10-CM | POA: Diagnosis not present

## 2023-10-15 ENCOUNTER — Telehealth: Payer: Self-pay | Admitting: Family Medicine

## 2023-10-15 NOTE — Telephone Encounter (Signed)
 [facility] to schedule a surgical clearance apt with DR STACKS.

## 2023-10-16 ENCOUNTER — Ambulatory Visit: Payer: Self-pay | Admitting: Pulmonary Disease

## 2023-10-16 DIAGNOSIS — D860 Sarcoidosis of lung: Secondary | ICD-10-CM

## 2023-10-16 DIAGNOSIS — Z5181 Encounter for therapeutic drug level monitoring: Secondary | ICD-10-CM

## 2023-10-19 ENCOUNTER — Telehealth: Payer: Self-pay | Admitting: Family Medicine

## 2023-10-19 ENCOUNTER — Ambulatory Visit: Admitting: Family Medicine

## 2023-10-19 ENCOUNTER — Telehealth: Payer: Self-pay | Admitting: Pulmonary Disease

## 2023-10-19 DIAGNOSIS — N182 Chronic kidney disease, stage 2 (mild): Secondary | ICD-10-CM | POA: Diagnosis not present

## 2023-10-19 DIAGNOSIS — M25551 Pain in right hip: Secondary | ICD-10-CM | POA: Diagnosis not present

## 2023-10-19 DIAGNOSIS — I129 Hypertensive chronic kidney disease with stage 1 through stage 4 chronic kidney disease, or unspecified chronic kidney disease: Secondary | ICD-10-CM | POA: Diagnosis not present

## 2023-10-19 DIAGNOSIS — R269 Unspecified abnormalities of gait and mobility: Secondary | ICD-10-CM | POA: Diagnosis not present

## 2023-10-19 DIAGNOSIS — R293 Abnormal posture: Secondary | ICD-10-CM | POA: Diagnosis not present

## 2023-10-19 DIAGNOSIS — N139 Obstructive and reflux uropathy, unspecified: Secondary | ICD-10-CM | POA: Diagnosis not present

## 2023-10-19 DIAGNOSIS — M6281 Muscle weakness (generalized): Secondary | ICD-10-CM | POA: Diagnosis not present

## 2023-10-19 DIAGNOSIS — E871 Hypo-osmolality and hyponatremia: Secondary | ICD-10-CM | POA: Diagnosis not present

## 2023-10-19 NOTE — Telephone Encounter (Signed)
Pt. Needs to be seen for this. Thanks, WS 

## 2023-10-19 NOTE — Telephone Encounter (Signed)
 Appointment tomorrow

## 2023-10-19 NOTE — Telephone Encounter (Signed)
 Copied from CRM [id number]. Topic: Clinical - Home Health Verbal Orders >> Oct 19, 2023 10:50 AM Delon HERO wrote: Maddie office Manager at [facility] located in [place] is calling on behalf Debbie Dabbs -PT. Recommending for the patient to get updated imaging due to concerns of for neurology tone.  Patient evaluated for PT 09/28/23 & 10/19/23.  Seen for dry needling 09/02/23  CB- [phone] x [phone]

## 2023-10-19 NOTE — Telephone Encounter (Signed)
 Fax received from Dr. Dempsey Lawn with [facility] to perform a right total hip arthroplasty on patient.  Patient needs surgery clearance. Surgery is 01/27/24. Patient was seen on 09/01/23. Office protocol is a risk assessment can be sent to surgeon if patient has been seen in 60 days or less.   Sending to Dr. Theophilus for risk assessment or recommendations if patient needs to be seen in office prior to surgical procedure.    Dr Theophilus, they are specifically asking for instructions regarding stopping methotrexate  prior to surgery.

## 2023-10-20 ENCOUNTER — Ambulatory Visit: Admitting: Family Medicine

## 2023-10-20 ENCOUNTER — Other Ambulatory Visit

## 2023-10-20 ENCOUNTER — Encounter: Payer: Self-pay | Admitting: Family Medicine

## 2023-10-20 VITALS — BP 130/74 | HR 67 | Temp 97.8°F | Ht 75.0 in | Wt 216.6 lb

## 2023-10-20 DIAGNOSIS — N2889 Other specified disorders of kidney and ureter: Secondary | ICD-10-CM

## 2023-10-20 DIAGNOSIS — E291 Testicular hypofunction: Secondary | ICD-10-CM | POA: Diagnosis not present

## 2023-10-20 DIAGNOSIS — M5416 Radiculopathy, lumbar region: Secondary | ICD-10-CM | POA: Diagnosis not present

## 2023-10-20 DIAGNOSIS — M1632 Unilateral osteoarthritis resulting from hip dysplasia, left hip: Secondary | ICD-10-CM

## 2023-10-20 DIAGNOSIS — E871 Hypo-osmolality and hyponatremia: Secondary | ICD-10-CM

## 2023-10-20 NOTE — Progress Notes (Signed)
 Subjective:  Patient ID: Luke Davila, male    DOB: 1965-03-14  Age: 58 y.o. MRN: [id number]  CC: surgical clearance   HPI  Discussed the use of AI scribe software for clinical note transcription with the patient, who gave verbal consent to proceed.  History of Present Illness Luke Davila is a 58 year old male with bilateral lung disease with emphysema who presents with leg tightness and difficulty walking.  He experiences tightness in his legs, particularly after sitting for extended periods, which makes walking difficult. The sensation is described as heaviness, as if dragging his legs or having 'quicksand in my shoes.' The tightness affects both legs equally and is not associated with significant pain, although occasional discomfort is noted. He has been undergoing Protherapy without relief.  An MRI of his lower back from November of last year showed mild disc bulging but no significant abnormalities. He has a history of bilateral lung disease with emphysema. He underwent a hip replacement nearly a decade ago and has avascular necrosis in his left hip, with surgery scheduled for January 20th next year.  Regarding kidney function, he was informed by his nephrologist that he does not have  active kidney disease and does not need follow-up for another six months. His sodium level was 130, which was deemed acceptable. His BUN and creatinine levels were 13 and 1.06, respectively, indicating good kidney function.  He manages his testosterone  levels with injections and regularly monitors his kidney and blood work. A hip x-ray in June of last year showed avascular necrosis in his left hip.          10/20/2023   11:48 AM 06/26/2023    3:19 PM 04/14/2023    9:48 AM  Depression screen PHQ 2/9  Decreased Interest 1 2 1   Down, Depressed, Hopeless 1 0 0  PHQ - 2 Score 2 2 1   Altered sleeping 2 1 1   Tired, decreased energy 1 1 0  Change in appetite 0 0 0  Feeling bad or  failure about yourself  0 0 0  Trouble concentrating 0 0 0  Moving slowly or fidgety/restless 0 3 0  Suicidal thoughts 0 0 0  PHQ-9 Score 5 7 2   Difficult doing work/chores Not difficult at all Not difficult at all Somewhat difficult    History [name] has a past medical history of Anemia of chronic renal failure, unspecified CKD stage, Arthritis, Avascular necrosis of hip (HCC), Enlarged prostate, Glaucoma, Gout, History of gout, and Pneumonia.   He has a past surgical history that includes Foot surgery (Left, 01/07/1983); Left hand surgery; Total hip arthroplasty (Left, 04/09/2015); Colonoscopy with propofol  (N/A, 11/27/2015); Wisdom tooth extraction; Transurethral resection of prostate (N/A, 03/27/2021); Endobronchial ultrasound (Bilateral, 07/17/2021); Fine needle aspiration (07/17/2021); and Video bronchoscopy (07/17/2021).   His family history includes Hypertension in his mother.He reports that he has never smoked. He has never used smokeless tobacco. He reports current alcohol use. He reports that he does not use drugs.    ROS Review of Systems  Constitutional: Negative.  Negative for fever.  HENT: Negative.    Eyes:  Negative for visual disturbance.  Respiratory:  Negative for cough and shortness of breath.   Cardiovascular:  Negative for chest pain and leg swelling.  Gastrointestinal:  Negative for abdominal pain, diarrhea, nausea and vomiting.  Genitourinary:  Negative for difficulty urinating.  Musculoskeletal:  Negative for arthralgias and myalgias.  Skin:  Negative for rash.  Neurological:  Negative for headaches.  Psychiatric/Behavioral:  Negative for sleep disturbance.     Objective:  BP 130/74   Pulse 67   Temp 97.8 F (36.6 C)   Ht 6' 3 (1.905 m)   Wt 216 lb 9.6 oz (98.2 kg)   SpO2 97%   BMI 27.07 kg/m   BP Readings from Last 3 Encounters:  10/20/23 130/74  09/01/23 122/74  06/26/23 131/72    Wt Readings from Last 3 Encounters:  10/20/23 216 lb 9.6 oz  (98.2 kg)  09/01/23 213 lb (96.6 kg)  06/26/23 214 lb (97.1 kg)     Physical Exam Vitals reviewed.  Constitutional:      Appearance: He is well-developed.  HENT:     Head: Normocephalic and atraumatic.     Right Ear: External ear normal.     Left Ear: External ear normal.     Mouth/Throat:     Pharynx: No oropharyngeal exudate or posterior oropharyngeal erythema.  Eyes:     Pupils: Pupils are equal, round, and reactive to light.  Cardiovascular:     Rate and Rhythm: Normal rate and regular rhythm.     Heart sounds: No murmur heard. Pulmonary:     Effort: No respiratory distress.     Breath sounds: Normal breath sounds.  Musculoskeletal:     Cervical back: Normal range of motion and neck supple.  Neurological:     Mental Status: He is alert and oriented to person, place, and time.    Physical Exam GENERAL: Alert, cooperative, well developed, no acute distress HEENT: Normocephalic, normal oropharynx, moist mucous membranes CHEST: Clear to auscultation bilaterally, No wheezes, rhonchi, or crackles CARDIOVASCULAR: Normal heart rate and rhythm, S1 and S2 normal without murmurs ABDOMEN: Soft, non-tender, non-distended, without organomegaly, Normal bowel sounds EXTREMITIES: No cyanosis or edema NEUROLOGICAL: Cranial nerves grossly intact, Moves all extremities without gross motor or sensory deficit   Assessment & Plan:  Lumbar radiculopathy -     MR LUMBAR SPINE WO CONTRAST; Future -     CMP14+EGFR  Hyponatremia -     CMP14+EGFR  Hypogonadism in male -     CMP14+EGFR  Osteoarthritis resulting from left hip dysplasia -     CMP14+EGFR  Chronic renal impairment, stage 3b -     BMP8+EGFR    Assessment and Plan Assessment & Plan Bilateral leg tightness and heaviness   Chronic bilateral leg tightness and heaviness worsens with prolonged sitting, causing a sensation of heaviness and difficulty initiating movement, described as feeling like dragging or having quicksand  in shoes. Differential diagnosis includes lumbar spine issues or avascular necrosis of the left hip. Order an MRI of the lumbar spine to evaluate potential spinal causes. Schedule follow-up for MRI results and further management.  Avascular necrosis of left hip   Avascular necrosis in the left hip is previously identified and scheduled for surgical intervention, potentially contributing to leg symptoms. Proceed with scheduled hip replacement surgery on January 26, 2024. Coordinate surgical clearance in December.  Chronic kidney disease   Kidney function is well-managed with recent BUN at 13 and creatinine at 1.06. Sodium levels are within the acceptable range of 130-135. Follow up with kidney function tests today.  Testosterone  deficiency   Ongoing management with regular testosterone  injections. Administer testosterone  injection as scheduled.       Follow-up: Return in about 6 months (around 04/19/2024).  Butler Der, M.D.

## 2023-10-21 ENCOUNTER — Ambulatory Visit: Payer: Self-pay | Admitting: Family Medicine

## 2023-10-21 DIAGNOSIS — E871 Hypo-osmolality and hyponatremia: Secondary | ICD-10-CM

## 2023-10-21 LAB — CMP14+EGFR
ALT: 20 IU/L (ref 0–44)
AST: 42 IU/L — ABNORMAL HIGH (ref 0–40)
Albumin: 4.3 g/dL (ref 3.8–4.9)
Alkaline Phosphatase: 147 IU/L — ABNORMAL HIGH (ref 47–123)
BUN/Creatinine Ratio: 11 (ref 9–20)
BUN: 13 mg/dL (ref 6–24)
Bilirubin Total: 1 mg/dL (ref 0.0–1.2)
CO2: 22 mmol/L (ref 20–29)
Calcium: 9.3 mg/dL (ref 8.7–10.2)
Chloride: 91 mmol/L — ABNORMAL LOW (ref 96–106)
Creatinine, Ser: 1.18 mg/dL (ref 0.76–1.27)
Globulin, Total: 2.6 g/dL (ref 1.5–4.5)
Glucose: 70 mg/dL (ref 70–99)
Potassium: 4 mmol/L (ref 3.5–5.2)
Sodium: 128 mmol/L — ABNORMAL LOW (ref 134–144)
Total Protein: 6.9 g/dL (ref 6.0–8.5)
eGFR: 72 mL/min/1.73 (ref >59)

## 2023-10-22 NOTE — Telephone Encounter (Signed)
 Looks like the LFTs are increasing which may be secondary to methotrexate  use I would advise you to stop the methotrexate  until the labs are normalized. Please order follow-up CMP in 1 month after methotrexate  is stopped and make a sooner visit with me at next available to discuss alternate therapies for sarcoidosis.

## 2023-10-23 ENCOUNTER — Other Ambulatory Visit: Payer: Self-pay

## 2023-10-23 DIAGNOSIS — Z5181 Encounter for therapeutic drug level monitoring: Secondary | ICD-10-CM

## 2023-10-23 NOTE — Telephone Encounter (Signed)
 Message relayed, patient verbalized understanding. Order placed for CMP in a month

## 2023-10-26 NOTE — Telephone Encounter (Signed)
 Lab schedule 11/23/2023 at 1pm. Patient was informed. Nfn

## 2023-11-03 ENCOUNTER — Other Ambulatory Visit: Payer: Self-pay | Admitting: Family Medicine

## 2023-11-03 ENCOUNTER — Ambulatory Visit (INDEPENDENT_AMBULATORY_CARE_PROVIDER_SITE_OTHER): Admitting: *Deleted

## 2023-11-03 ENCOUNTER — Other Ambulatory Visit

## 2023-11-03 DIAGNOSIS — N2889 Other specified disorders of kidney and ureter: Secondary | ICD-10-CM | POA: Diagnosis not present

## 2023-11-03 DIAGNOSIS — R293 Abnormal posture: Secondary | ICD-10-CM | POA: Diagnosis not present

## 2023-11-03 DIAGNOSIS — E291 Testicular hypofunction: Secondary | ICD-10-CM

## 2023-11-03 DIAGNOSIS — M25551 Pain in right hip: Secondary | ICD-10-CM | POA: Diagnosis not present

## 2023-11-03 DIAGNOSIS — M6281 Muscle weakness (generalized): Secondary | ICD-10-CM | POA: Diagnosis not present

## 2023-11-03 DIAGNOSIS — R269 Unspecified abnormalities of gait and mobility: Secondary | ICD-10-CM | POA: Diagnosis not present

## 2023-11-03 NOTE — Progress Notes (Addendum)
 Patient is in office today for a nurse visit for Testosterone  Injection.  Injection was given in the  Left upper quad. gluteus. Patient tolerated injection well. Patient supplied.

## 2023-11-04 ENCOUNTER — Ambulatory Visit: Payer: Self-pay | Admitting: Family Medicine

## 2023-11-04 LAB — BMP8+EGFR
BUN/Creatinine Ratio: 6 — ABNORMAL LOW (ref 9–20)
BUN: 8 mg/dL (ref 6–24)
CO2: 21 mmol/L (ref 20–29)
Calcium: 9.1 mg/dL (ref 8.7–10.2)
Chloride: 95 mmol/L — ABNORMAL LOW (ref 96–106)
Creatinine, Ser: 1.25 mg/dL (ref 0.76–1.27)
Glucose: 66 mg/dL — ABNORMAL LOW (ref 70–99)
Potassium: 4.2 mmol/L (ref 3.5–5.2)
Sodium: 133 mmol/L — ABNORMAL LOW (ref 134–144)
eGFR: 67 mL/min/1.73 (ref >59)

## 2023-11-04 NOTE — Progress Notes (Signed)
Hello Luke Davila,  Your lab result is normal and/or stable.Some minor variations that are not significant are commonly marked abnormal, but do not represent any medical problem for you.  Best regards, Dontarious Schaum, M.D.

## 2023-11-11 DIAGNOSIS — R269 Unspecified abnormalities of gait and mobility: Secondary | ICD-10-CM | POA: Diagnosis not present

## 2023-11-11 DIAGNOSIS — M6281 Muscle weakness (generalized): Secondary | ICD-10-CM | POA: Diagnosis not present

## 2023-11-11 DIAGNOSIS — M25551 Pain in right hip: Secondary | ICD-10-CM | POA: Diagnosis not present

## 2023-11-11 DIAGNOSIS — R293 Abnormal posture: Secondary | ICD-10-CM | POA: Diagnosis not present

## 2023-11-13 ENCOUNTER — Other Ambulatory Visit

## 2023-11-14 ENCOUNTER — Ambulatory Visit
Admission: RE | Admit: 2023-11-14 | Discharge: 2023-11-14 | Disposition: A | Discharge status: Home / Self Care | Source: Ambulatory Visit | Attending: Family Medicine | Admitting: Family Medicine

## 2023-11-14 DIAGNOSIS — M5416 Radiculopathy, lumbar region: Secondary | ICD-10-CM

## 2023-11-14 DIAGNOSIS — M5116 Intervertebral disc disorders with radiculopathy, lumbar region: Secondary | ICD-10-CM | POA: Diagnosis not present

## 2023-11-14 DIAGNOSIS — M4727 Other spondylosis with radiculopathy, lumbosacral region: Secondary | ICD-10-CM | POA: Diagnosis not present

## 2023-11-16 DIAGNOSIS — R339 Retention of urine, unspecified: Secondary | ICD-10-CM | POA: Diagnosis not present

## 2023-11-17 ENCOUNTER — Ambulatory Visit: Payer: Self-pay

## 2023-11-18 ENCOUNTER — Telehealth: Payer: Self-pay | Admitting: Family Medicine

## 2023-11-18 ENCOUNTER — Telehealth: Payer: Self-pay

## 2023-11-18 NOTE — Telephone Encounter (Signed)
 Copied from CRM #[id number]. Topic: Clinical - Lab/Test Results >> Nov 18, 2023 10:06 AM Merlynn LABOR wrote: Reason for CRM: Patient called in returning call from nurse about lab results. Please contact patient back at [phone].

## 2023-11-18 NOTE — Telephone Encounter (Signed)
 Copied from CRM [id number]. Topic: Clinical - Lab/Test Results >> Nov 17, 2023  5:17 PM Nathanel BROCKS wrote: Reason for CRM: pt returned call to get results. He stated that someone just called and I advised him that they was closed. Pt did not understand but I tried to tell him. Please call back.

## 2023-11-18 NOTE — Telephone Encounter (Signed)
 Tried calling patient back to give him his MRI results but no answer. Left detailed message making patient aware of MRI results and advised for patient to call back if he had any questions.

## 2023-11-18 NOTE — Telephone Encounter (Signed)
Attempted to call back again.

## 2023-11-18 NOTE — Telephone Encounter (Signed)
 Attempted to call back again. Unable to lm on vm. Please relay MRI results or have patient look at them on MyChart. LS

## 2023-11-20 NOTE — Telephone Encounter (Signed)
 Luke Davila

## 2023-11-23 ENCOUNTER — Other Ambulatory Visit (INDEPENDENT_AMBULATORY_CARE_PROVIDER_SITE_OTHER)

## 2023-11-23 DIAGNOSIS — Z5181 Encounter for therapeutic drug level monitoring: Secondary | ICD-10-CM | POA: Diagnosis not present

## 2023-11-23 DIAGNOSIS — D860 Sarcoidosis of lung: Secondary | ICD-10-CM | POA: Diagnosis not present

## 2023-11-24 ENCOUNTER — Other Ambulatory Visit: Payer: Self-pay | Admitting: Pulmonary Disease

## 2023-11-24 LAB — COMPREHENSIVE METABOLIC PANEL WITH GFR
ALT: 26 U/L (ref 0–53)
AST: 49 U/L — ABNORMAL HIGH (ref 0–37)
Albumin: 4.3 g/dL (ref 3.5–5.2)
Alkaline Phosphatase: 115 U/L (ref 39–117)
BUN: 15 mg/dL (ref 6–23)
CO2: 28 meq/L (ref 19–32)
Calcium: 9.5 mg/dL (ref 8.4–10.5)
Chloride: 99 meq/L (ref 96–112)
Creatinine, Ser: 1.3 mg/dL (ref 0.40–1.50)
GFR: 60.68 mL/min (ref >60.00)
Glucose, Bld: 72 mg/dL (ref 70–99)
Potassium: 3.8 meq/L (ref 3.5–5.1)
Sodium: 137 meq/L (ref 135–145)
Total Bilirubin: 0.8 mg/dL (ref 0.2–1.2)
Total Protein: 7.4 g/dL (ref 6.0–8.3)

## 2023-11-25 ENCOUNTER — Other Ambulatory Visit: Payer: Self-pay | Admitting: Family Medicine

## 2023-11-25 DIAGNOSIS — R293 Abnormal posture: Secondary | ICD-10-CM | POA: Diagnosis not present

## 2023-11-25 DIAGNOSIS — M6281 Muscle weakness (generalized): Secondary | ICD-10-CM | POA: Diagnosis not present

## 2023-11-25 DIAGNOSIS — R269 Unspecified abnormalities of gait and mobility: Secondary | ICD-10-CM | POA: Diagnosis not present

## 2023-11-25 DIAGNOSIS — M25551 Pain in right hip: Secondary | ICD-10-CM | POA: Diagnosis not present

## 2023-11-26 NOTE — Telephone Encounter (Signed)
 Stacks pt NTBS 30-d given 11/03/23

## 2023-11-26 NOTE — Telephone Encounter (Signed)
 I called pt & made him an appt in Dec 2025 for med refill.

## 2023-12-01 DIAGNOSIS — R350 Frequency of micturition: Secondary | ICD-10-CM | POA: Diagnosis not present

## 2023-12-01 DIAGNOSIS — N390 Urinary tract infection, site not specified: Secondary | ICD-10-CM | POA: Diagnosis not present

## 2023-12-01 DIAGNOSIS — R3914 Feeling of incomplete bladder emptying: Secondary | ICD-10-CM | POA: Diagnosis not present

## 2023-12-02 ENCOUNTER — Telehealth: Payer: Self-pay

## 2023-12-02 NOTE — Telephone Encounter (Signed)
 Copied from CRM #[id number]. Topic: Clinical - Medication Question >> Dec 01, 2023  4:02 PM Rozanna MATSU wrote: Reason for CRM: pt called stated someone called him but did not leave a message and I am not showing were a note was placed. >> Dec 01, 2023  4:31 PM Leila BROCKS wrote: Patient [phone] states he think Dr. Theophilus has been trying to call him reagrding his labs results, twice, no details message was left, Unable to reach CAL, tried few times. Please advise and call back.   Called and spoke to pt. Pt states that he is returning a call from Dr. Theophilus, but I am not seeing in his chart where Dr. Theophilus tried to call this pt.  Dr. Theophilus, please advise.

## 2023-12-02 NOTE — Telephone Encounter (Signed)
 Copied from CRM #[id number]. Topic: Clinical - Lab/Test Results >> Dec 01, 2023  4:30 PM Leila BROCKS wrote: Reason for CRM: Patient [phone] states he think Dr. Theophilus has been trying to call him reagrding his labs results, twice, no details message was left, Unable to reach CAL, tried few times. Please advise and call back.   Duplicate.

## 2023-12-07 ENCOUNTER — Telehealth: Payer: Self-pay | Admitting: Neurology

## 2023-12-07 DIAGNOSIS — M6281 Muscle weakness (generalized): Secondary | ICD-10-CM | POA: Diagnosis not present

## 2023-12-07 DIAGNOSIS — R269 Unspecified abnormalities of gait and mobility: Secondary | ICD-10-CM | POA: Diagnosis not present

## 2023-12-07 DIAGNOSIS — M25551 Pain in right hip: Secondary | ICD-10-CM | POA: Diagnosis not present

## 2023-12-07 DIAGNOSIS — R293 Abnormal posture: Secondary | ICD-10-CM | POA: Diagnosis not present

## 2023-12-07 NOTE — Telephone Encounter (Signed)
 Marval Braver from Physical Therapy in [place] at [phone]. Marval would like to get a return call to discuss Pt 's situation at this time. Thanks

## 2023-12-07 NOTE — Telephone Encounter (Signed)
 Yes.  I was trying to reach him regarding his lab results.  Please let him know that his LFTs are still elevated Can you confirm if he is still on methotrexate  or if he has stopped taking it.

## 2023-12-08 NOTE — Telephone Encounter (Signed)
 Returned a call to [employer] no answer did not leave a voice mail it was not secure,

## 2023-12-08 NOTE — Telephone Encounter (Signed)
 ATC x1. LVMTCB

## 2023-12-08 NOTE — Telephone Encounter (Signed)
 Called and spoke to pt - advised that his LFT was still elevated per Dr. Theophilus.  Pt states that he does not believe that he is taking the methotrexate  currently.  Pt expressed concerns regarding his LFT and requesting more clarification on what exactly that means. Please advise

## 2023-12-10 NOTE — Telephone Encounter (Signed)
 Attempted to call Debbie. However, phone number is incorrect for Debbie and patients phone number was put in place of Debbies. Unable to call. Will have to wait for call back.

## 2023-12-11 ENCOUNTER — Telehealth: Payer: Self-pay

## 2023-12-11 NOTE — Telephone Encounter (Signed)
 Copied from CRM #[id number]. Topic: Clinical - Request for Lab/Test Order >> Dec 09, 2023 11:33 AM Leila C wrote: Reason for CRM: Patient [phone] wants to schedule a lab appointment in a month. There's no pending lab order for patient. Informed patient, will send a message to clinica to order lab and have the office call back to schedule an appointment. Please advise and call back.    Tried to reach out to patient VM/ LM for return call I see  where he had labs done on 11/18- do not see any notes available.   Looks like back in October Dr.Mannam had placed these labs and requested a sooner OV appointment to discuss other therapeutic Rx

## 2023-12-14 ENCOUNTER — Encounter: Admitting: Family Medicine

## 2023-12-14 ENCOUNTER — Telehealth: Admitting: Family Medicine

## 2023-12-14 ENCOUNTER — Encounter: Payer: Self-pay | Admitting: Family Medicine

## 2023-12-14 DIAGNOSIS — J8410 Pulmonary fibrosis, unspecified: Secondary | ICD-10-CM | POA: Diagnosis not present

## 2023-12-14 DIAGNOSIS — E782 Mixed hyperlipidemia: Secondary | ICD-10-CM | POA: Diagnosis not present

## 2023-12-14 DIAGNOSIS — R339 Retention of urine, unspecified: Secondary | ICD-10-CM | POA: Diagnosis not present

## 2023-12-14 DIAGNOSIS — E291 Testicular hypofunction: Secondary | ICD-10-CM

## 2023-12-14 MED ORDER — ROSUVASTATIN CALCIUM 10 MG PO TABS: 10.0000 mg | ORAL_TABLET | Freq: Every day | ORAL | 3 refills | Status: DC

## 2023-12-14 MED ORDER — ROSUVASTATIN CALCIUM 10 MG PO TABS: 10.0000 mg | ORAL_TABLET | Freq: Every day | ORAL | 3 refills | Status: AC

## 2023-12-14 NOTE — Progress Notes (Deleted)
   Subjective:  Patient ID: Luke Davila, male    DOB: Jul 27, 1965  Age: 58 y.o. MRN: [id number]  CC: No chief complaint on file.   HPI  Discussed the use of AI scribe software for clinical note transcription with the patient, who gave verbal consent to proceed.  History of Present Illness           10/20/2023   11:48 AM 06/26/2023    3:19 PM 04/14/2023    9:48 AM  Depression screen PHQ 2/9  Decreased Interest 1 2 1   Down, Depressed, Hopeless 1 0 0  PHQ - 2 Score 2 2 1   Altered sleeping 2 1 1   Tired, decreased energy 1 1 0  Change in appetite 0 0 0  Feeling bad or failure about yourself  0 0 0  Trouble concentrating 0 0 0  Moving slowly or fidgety/restless 0 3 0  Suicidal thoughts 0 0 0  PHQ-9 Score 5  7  2    Difficult doing work/chores Not difficult at all Not difficult at all Somewhat difficult     Data saved with a previous flowsheet row definition    History Khalen has a past medical history of Anemia of chronic renal failure, unspecified CKD stage, Arthritis, Avascular necrosis of hip (HCC), Enlarged prostate, Glaucoma, Gout, History of gout, and Pneumonia.   He has a past surgical history that includes Foot surgery (Left, 01/07/1983); Left hand surgery; Total hip arthroplasty (Left, 04/09/2015); Colonoscopy with propofol  (N/A, 11/27/2015); Wisdom tooth extraction; Transurethral resection of prostate (N/A, 03/27/2021); Endobronchial ultrasound (Bilateral, 07/17/2021); Fine needle aspiration (07/17/2021); and Video bronchoscopy (07/17/2021).   His family history includes Hypertension in his mother.He reports that he has never smoked. He has never used smokeless tobacco. He reports current alcohol use. He reports that he does not use drugs.    ROS Review of Systems  Objective:  There were no vitals taken for this visit.  BP Readings from Last 3 Encounters:  10/20/23 130/74  09/01/23 122/74  06/26/23 131/72    Wt Readings from Last 3 Encounters:  10/20/23 216  lb 9.6 oz (98.2 kg)  09/01/23 213 lb (96.6 kg)  06/26/23 214 lb (97.1 kg)     Physical Exam Physical Exam    Assessment & Plan:  Mixed hyperlipidemia -     CMP14+EGFR -     Lipid panel  Hypogonadism in male -     CBC with Differential/Platelet -     CMP14+EGFR -     Testosterone ,Free and Total  Other orders -     Rosuvastatin  Calcium ; Take 1 tablet (10 mg total) by mouth daily. **NEEDS TO BE SEEN BEFORE NEXT REFILL**  Dispense: 90 tablet; Refill: 3    Assessment and Plan Assessment & Plan        Follow-up: No follow-ups on file.  Butler Der, M.D.

## 2023-12-14 NOTE — Progress Notes (Signed)
 Refill failed. resent

## 2023-12-14 NOTE — Progress Notes (Signed)
 Subjective:    Patient ID: Luke Davila, male    DOB: Feb 04, 1965, 58 y.o.   MRN: [id number]   HPI: Luke Davila is a 58 y.o. male presenting for  in for follow-up of elevated cholesterol. Doing well without complaints on current medication. Denies side effects of statin including myalgia and arthralgia and nausea. Currently no chest pain, shortness of breath or other cardiovascular related symptoms noted.  Also followed for Testosterone  replacement. Still using biweekly injections. Hx of Sarcoidosis of lung. No current dyspnea.       10/20/2023   11:48 AM 06/26/2023    3:19 PM 04/14/2023    9:48 AM 10/16/2022    2:29 PM 10/08/2022    9:19 AM  Depression screen PHQ 2/9  Decreased Interest 1 2 1 1 1   Down, Depressed, Hopeless 1 0 0 0 0  PHQ - 2 Score 2 2 1 1 1   Altered sleeping 2 1 1 1 1   Tired, decreased energy 1 1 0 1 1  Change in appetite 0 0 0 0 0  Feeling bad or failure about yourself  0 0 0 0 0  Trouble concentrating 0 0 0 0 0  Moving slowly or fidgety/restless 0 3 0 0 0  Suicidal thoughts 0 0 0 0 0  PHQ-9 Score 5  7  2  3  3    Difficult doing work/chores Not difficult at all Not difficult at all Somewhat difficult Somewhat difficult Somewhat difficult     Data saved with a previous flowsheet row definition     Relevant past medical, surgical, family and social history reviewed and updated as indicated.  Interim medical history since our last visit reviewed. Allergies and medications reviewed and updated.  ROS:  Review of Systems  Constitutional: Negative.   HENT: Negative.    Eyes:  Negative for visual disturbance.  Respiratory:  Negative for cough and shortness of breath.   Cardiovascular:  Negative for chest pain and leg swelling.  Gastrointestinal:  Negative for abdominal pain, diarrhea, nausea and vomiting.  Genitourinary:  Negative for difficulty urinating.  Musculoskeletal:  Negative for arthralgias and myalgias.  Skin:  Negative for rash.   Neurological:  Negative for headaches.  Psychiatric/Behavioral:  Negative for sleep disturbance.      Social History   Tobacco Use  Smoking Status Never  Smokeless Tobacco Never       Objective:     Wt Readings from Last 3 Encounters:  10/20/23 216 lb 9.6 oz (98.2 kg)  09/01/23 213 lb (96.6 kg)  06/26/23 214 lb (97.1 kg)     Exam deferred. Video visit performed.   Assessment & Plan:  Mixed hyperlipidemia -     CMP14+EGFR -     Lipid panel  Hypogonadism in male -     CBC with Differential/Platelet -     CMP14+EGFR -     Testosterone ,Free and Total  Pulmonary fibrosis (HCC)  Other orders -     Rosuvastatin  Calcium ; Take 1 tablet (10 mg total) by mouth daily.  Dispense: 90 tablet; Refill: 3      Diagnoses and all orders for this visit:  Mixed hyperlipidemia -     CMP14+EGFR -     Lipid panel  Hypogonadism in male -     CBC with Differential/Platelet -     CMP14+EGFR -     Testosterone ,Free and Total  Pulmonary fibrosis (HCC)  Other orders -     Discontinue: rosuvastatin  (CRESTOR ) 10 MG  tablet; Take 1 tablet (10 mg total) by mouth daily. **NEEDS TO BE SEEN BEFORE NEXT REFILL** -     Discontinue: rosuvastatin  (CRESTOR ) 10 MG tablet; Take 1 tablet (10 mg total) by mouth daily. **NEEDS TO BE SEEN BEFORE NEXT REFILL** -     rosuvastatin  (CRESTOR ) 10 MG tablet; Take 1 tablet (10 mg total) by mouth daily.    Virtual Visit  Note  I discussed the limitations, risks, security and privacy concerns of performing an evaluation and management service by video and the availability of in person appointments. The patient was identified with two identifiers. Pt.expressed understanding and agreed to proceed. Pt. Is at home. Dr. Zollie is in his office.  Follow Up Instructions:   I discussed the assessment and treatment plan with the patient. The patient was provided an opportunity to ask questions and all were answered. The patient agreed with the plan and demonstrated  an understanding of the instructions.   The patient was advised to call back or seek an in-person evaluation if the symptoms worsen or if the condition fails to improve as anticipated.      Follow up plan: Return in about 3 months (around 03/13/2024).  Butler Zollie, MD [facility]

## 2023-12-15 ENCOUNTER — Encounter: Payer: Self-pay | Admitting: Pulmonary Disease

## 2023-12-15 NOTE — Telephone Encounter (Signed)
 Risk assessment is in separate note. Methotrexate  is already on hold due to elevated LFTs

## 2023-12-15 NOTE — Progress Notes (Signed)
 [facility] note Peri-operative Assessment of Pulmonary Risk for Non-Thoracic Surgery:  Asked to provide pulmonary risk assessment for planned right knee replacement Patient is being followed in pulmonary clinic for Sarcoidosis  By ARISCAT criteria patient has   Low risk 1.6% risk of in-hospital post-op pulmonary complications (composite including respiratory failure, respiratory infection, pleural effusion, atelectasis, pneumothorax, bronchospasm, aspiration pneumonitis)  Methotrexate  is already on hold due to elevated LFTs which is being monitored  ForMs. Luke Davila, risk of perioperative pulmonary complications is increased by:  Interstitial lung dsiease  Respiratory complications generally occur in 1% of ASA Class I patients, 5% of ASA Class II and 10% of ASA Class III-IV patients These complications rarely result in mortality and iclude postoperative pneumonia, atelectasis, pulmonary embolism, ARDS and increased time requiring postoperative mechanical ventilation.  Overall, I recommend proceeding with the surgery if the risk for respiratory complications are outweighed by the potential benefits. This will need to be discussed between the patient and surgeon.  To reduce risks of respiratory complications, I recommend: --Pre- and post-operative incentive spirometry performed frequently while awake --Avoiding use of pancuronium during anesthesia.  Luke Burnsed MD [facility] See Amion for pager  If no response to pager , please call [phone] until 7pm After 7:00 pm call Elink  [phone] 12/15/2023, 11:41 AM

## 2023-12-15 NOTE — Progress Notes (Signed)
 Cancelled by pt

## 2023-12-17 DIAGNOSIS — R269 Unspecified abnormalities of gait and mobility: Secondary | ICD-10-CM | POA: Diagnosis not present

## 2023-12-17 DIAGNOSIS — M6281 Muscle weakness (generalized): Secondary | ICD-10-CM | POA: Diagnosis not present

## 2023-12-17 DIAGNOSIS — R293 Abnormal posture: Secondary | ICD-10-CM | POA: Diagnosis not present

## 2023-12-17 DIAGNOSIS — M25551 Pain in right hip: Secondary | ICD-10-CM | POA: Diagnosis not present

## 2023-12-17 NOTE — Telephone Encounter (Signed)
 Copy of risk assessment 12/15/23 was faxed to [facility].

## 2023-12-23 ENCOUNTER — Other Ambulatory Visit

## 2023-12-23 ENCOUNTER — Ambulatory Visit

## 2023-12-23 DIAGNOSIS — E291 Testicular hypofunction: Secondary | ICD-10-CM | POA: Diagnosis not present

## 2023-12-23 DIAGNOSIS — N2889 Other specified disorders of kidney and ureter: Secondary | ICD-10-CM

## 2023-12-23 NOTE — Progress Notes (Signed)
 Patient is in office today for a nurse visit for Testosterone Injection. Patient Injection was given in the  Right upper quad. gluteus. Patient tolerated injection well.

## 2023-12-24 LAB — BMP8+EGFR
BUN/Creatinine Ratio: 15 (ref 9–20)
BUN: 18 mg/dL (ref 6–24)
CO2: 23 mmol/L (ref 20–29)
Calcium: 9.4 mg/dL (ref 8.7–10.2)
Chloride: 97 mmol/L (ref 96–106)
Creatinine, Ser: 1.22 mg/dL (ref 0.76–1.27)
Glucose: 71 mg/dL (ref 70–99)
Potassium: 4.6 mmol/L (ref 3.5–5.2)
Sodium: 134 mmol/L (ref 134–144)
eGFR: 69 mL/min/1.73 (ref >59)

## 2023-12-28 ENCOUNTER — Ambulatory Visit: Payer: Self-pay | Admitting: Family Medicine

## 2023-12-28 NOTE — Progress Notes (Signed)
Hello Luke Davila,  Your lab result is normal and/or stable.Some minor variations that are not significant are commonly marked abnormal, but do not represent any medical problem for you.  Best regards, Dontarious Schaum, M.D.

## 2024-01-09 ENCOUNTER — Encounter (HOSPITAL_COMMUNITY): Payer: Self-pay

## 2024-01-11 ENCOUNTER — Encounter: Payer: Self-pay | Admitting: Nurse Practitioner

## 2024-01-11 ENCOUNTER — Ambulatory Visit

## 2024-01-11 ENCOUNTER — Ambulatory Visit: Admitting: Nurse Practitioner

## 2024-01-11 ENCOUNTER — Other Ambulatory Visit

## 2024-01-11 ENCOUNTER — Ambulatory Visit: Payer: Self-pay

## 2024-01-11 VITALS — BP 125/77 | HR 63 | Temp 96.7°F | Ht 75.0 in | Wt 211.0 lb

## 2024-01-11 DIAGNOSIS — N2889 Other specified disorders of kidney and ureter: Secondary | ICD-10-CM

## 2024-01-11 DIAGNOSIS — M25551 Pain in right hip: Secondary | ICD-10-CM

## 2024-01-11 MED ORDER — NAPROXEN 500 MG PO TABS: 500.0000 mg | ORAL_TABLET | Freq: Two times a day (BID) | ORAL | 1 refills | Status: AC

## 2024-01-11 NOTE — Progress Notes (Signed)
 "  Subjective:    Patient ID: Luke Davila, male    DOB: 1965/03/21, 59 y.o.   MRN: [id number]   Chief Complaint: Hip Pain Amon on Christmas eve. Right hip pain/)   Patient in c/o right hip pain. Patient fell Christmas Eve and landed on that hip. Has been hurting every since. Painful to walk on.  * scheduled for total hip replacement on January 21,2026  Hip Pain  The incident occurred at home. The injury mechanism was a direct blow. The pain is present in the right hip. The quality of the pain is described as aching. The pain is at a severity of 10/10 (when walking). The pain is severe. The pain has been Intermittent since onset. Associated symptoms include an inability to bear weight. The symptoms are aggravated by weight bearing and movement. Luke Davila has tried nothing for the symptoms.    Patient Active Problem List   Diagnosis Date Noted   Left lower quadrant pain 10/08/2022   History of anemia due to CKD 02/07/2022   Pulmonary fibrosis (HCC) 11/27/2020   Atherosclerosis of aorta 11/27/2020   Atherosclerosis of right carotid artery 11/27/2020   BPH (benign prostatic hyperplasia) 10/05/2020   Obstructive uropathy 10/05/2020   AKI (acute kidney injury) 10/05/2020   Sarcoidosis of lung 09/30/2019   Primary insomnia 02/14/2019   Bilateral primary osteoarthritis of knee 03/24/2018   PVC's (premature ventricular contractions) 02/03/2018   Hyperlipidemia 02/03/2018   Hyponatremia 08/06/2016   Constipation 10/29/2015   OA (osteoarthritis) of hip 04/09/2015   Hypogonadism in male 09/12/2014   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  Eyes:  Negative for pain.  Respiratory:  Negative for shortness of breath.   Cardiovascular:  Negative for chest pain, palpitations and leg swelling.  Gastrointestinal:  Negative for abdominal  pain.  Endocrine: Negative for polydipsia.  Musculoskeletal:  Positive for arthralgias (right hip).  Skin:  Negative for rash.  Neurological:  Negative for dizziness, weakness and headaches.  Hematological:  Does not bruise/bleed easily.  All other systems reviewed and are negative.      Objective:   Physical Exam Constitutional:      Appearance: Normal appearance.  Cardiovascular:     Rate and Rhythm: Normal rate and regular rhythm.     Heart sounds: Normal heart sounds.  Pulmonary:     Effort: Pulmonary effort is normal.     Breath sounds: Normal breath sounds.  Musculoskeletal:     Comments: Decrease ROM of hip with pain on abduction and adduction.  Skin:    General: Skin is warm.  Neurological:     General: No focal deficit present.     Mental Status: Luke Davila is alert and oriented to person, place, and time.  Psychiatric:        Mood and Affect: Mood normal.        Behavior: Behavior normal.     BP 125/77   Pulse 63   Temp (!) 96.7 F (35.9 C) (Temporal)   Ht 6' 3 (1.905 m)   Wt 211 lb (95.7 kg)   SpO2 98%   BMI 26.37 kg/m   Right hip xray- no fracture visible-Preliminary reading by Ronal Lunger, FNP  [facility]      Assessment & Plan:   Luke Davila in today with chief complaint of  Hip Pain Amon on Christmas eve. Right hip pain/)   1. Chronic renal impairment, stage 3b - BMP8+EGFR  2. Pain of right hip (Primary) Ice Rest RTO prn - DG HIP UNILAT W OR W/O PELVIS 2-3 VIEWS RIGHT    The above assessment and management plan was discussed with the patient. The patient verbalized understanding of and has agreed to the management plan. Patient is aware to call the clinic if symptoms persist or worsen. Patient is aware when to return to the clinic for a follow-up visit. Patient educated on when it is appropriate to go to the emergency department.   Mary-Margaret Gladis, FNP   "

## 2024-01-11 NOTE — Patient Instructions (Signed)
 Hip Pain The hip is the joint between the upper legs and the lower pelvis. The bones, cartilage, tendons, and muscles of your hip joint support your body and allow you to move around. Hip pain can range from a minor ache to severe pain in one or both of your hips. The pain may be felt on the inside of the hip joint near the groin, or on the outside near the buttocks and upper thigh. You may also have swelling or stiffness in your hip area. Follow these instructions at home: Managing pain, stiffness, and swelling     If told, put ice on the painful area. Put ice in a plastic bag. Place a towel between your skin and the bag. Leave the ice on for 20 minutes, 2-3 times a day. If told, apply heat to the affected area as often as told by your health care provider. Use the heat source that your provider recommends, such as a moist heat pack or a heating pad. Place a towel between your skin and the heat source. Leave the heat on for 20-30 minutes. If your skin turns bright red, remove the ice or heat right away to prevent skin damage. The risk of damage is higher if you cannot feel pain, heat, or cold. Activity Do exercises as told by your provider. Avoid activities that cause pain. General instructions  Take over-the-counter and prescription medicines only as told by your provider. Keep a journal of your symptoms. Write down: How often you have hip pain. The location of your pain. What the pain feels like. What makes the pain worse. Sleep with a pillow between your legs on your most comfortable side. Keep all follow-up visits. Your provider will monitor your pain and activity. Contact a health care provider if: You cannot put weight on your leg. Your pain or swelling gets worse after a week. It gets harder to walk. You have a fever. Get help right away if: You fall. You have a sudden increase in pain and swelling in your hip. Your hip is red or swollen or very tender to touch. This  information is not intended to replace advice given to you by your health care provider. Make sure you discuss any questions you have with your health care provider. Document Revised: 08/27/2021 Document Reviewed: 08/27/2021 Elsevier Patient Education  2024 [employer].

## 2024-01-11 NOTE — Telephone Encounter (Signed)
 FYI Only or Action Required?: Action required by provider: request for appointment.  Patient was last seen in primary care on 12/14/2023 by Zollie Lowers, MD.  Called Nurse Triage reporting Fall.  Symptoms began several weeks ago.  Interventions attempted: Rest, hydration, or home remedies.  Symptoms are: unchanged. Clemens, injured right hip. Scheduled for hip replacement.  Triage Disposition: See HCP Within 4 Hours (Or PCP Triage)  Patient/caregiver understands and will follow disposition?: Yes      Copied from CRM #[id number]. Topic: Clinical - Red Word Triage >> Jan 11, 2024 10:04 AM Zane F wrote: Red Word that prompted transfer to Nurse Triage:   Concern: Patient fell backwards on 12/30/2023. Previously scheduled for a acute visit for Today at 2:45pm with PCP but was never triaged.   Patient is due for hip replacement surgery on 01/27/2023 and wants to get x-rays completed before hand to ensure he didn't further injure himself.   Patient confirms he did not hit his head as he fell backwards with the right side taking the impact .  Patient states his hips  Pain Level: when walking 10  Symptoms: shooting pain from the hip to knee on right side   When did the symptoms start?: 12/30/2023   What have you done to aid in the concern ? Have you taken anything to assist with the matter?: No   If so, what did you take?: when the event occurred he took naproxen  but hasn't taken anything since    Wanted to let you know I will be transferring you to further discuss your concern. Please be advised the nurse can assist with scheduling. Reason for Disposition  [1] MODERATE weakness (e.g., interferes with work, school, normal activities) AND [2] new-onset or getting worse  Answer Assessment - Initial Assessment Questions 1. MECHANISM: How did the fall happen?     tripped 2. DOMESTIC VIOLENCE AND ELDER ABUSE SCREENING: Did you fall because someone pushed you or tried to hurt  you? If Yes, ask: Are you safe now?     no 3. ONSET: When did the fall happen? (e.g., minutes, hours, or days ago)     12/30/23 4. LOCATION: What part of the body hit the ground? (e.g., back, buttocks, head, hips, knees, hands, head, stomach)     buttocks 5. INJURY: Did you hurt (injure) yourself when you fell? If Yes, ask: What did you injure? Tell me more about this? (e.g., body area; type of injury; pain severity)     Right hip 6. PAIN: Is there any pain? If Yes, ask: How bad is the pain? (e.g., Scale 0-10; or none, mild,      severe 7. SIZE: For cuts, bruises, or swelling, ask: How large is it? (e.g., inches or centimeters)      N/a 8. PREGNANCY: Is there any chance you are pregnant? When was your last menstrual period?     N/a 9. OTHER SYMPTOMS: Do you have any other symptoms? (e.g., dizziness, fever, weakness; new-onset or worsening).      no 10. CAUSE: What do you think caused the fall (or falling)? (e.g., dizzy spell, tripped)       tripped  Protocols used: [facility]

## 2024-01-11 NOTE — Telephone Encounter (Signed)
Noted  -LS

## 2024-01-12 ENCOUNTER — Other Ambulatory Visit

## 2024-01-12 ENCOUNTER — Other Ambulatory Visit: Payer: Self-pay | Admitting: *Deleted

## 2024-01-12 DIAGNOSIS — Z5181 Encounter for therapeutic drug level monitoring: Secondary | ICD-10-CM

## 2024-01-12 LAB — HEPATIC FUNCTION PANEL
ALT: 23 U/L (ref 3–53)
AST: 47 U/L — ABNORMAL HIGH (ref 5–37)
Albumin: 4 g/dL (ref 3.5–5.2)
Alkaline Phosphatase: 190 U/L — ABNORMAL HIGH (ref 39–117)
Bilirubin, Direct: 0.2 mg/dL (ref 0.1–0.3)
Total Bilirubin: 0.7 mg/dL (ref 0.2–1.2)
Total Protein: 7.6 g/dL (ref 6.0–8.3)

## 2024-01-12 LAB — BMP8+EGFR
BUN/Creatinine Ratio: 9 (ref 9–20)
BUN: 11 mg/dL (ref 6–24)
CO2: 24 mmol/L (ref 20–29)
Calcium: 9 mg/dL (ref 8.7–10.2)
Chloride: 101 mmol/L (ref 96–106)
Creatinine, Ser: 1.28 mg/dL — ABNORMAL HIGH (ref 0.76–1.27)
Glucose: 76 mg/dL (ref 70–99)
Potassium: 4.2 mmol/L (ref 3.5–5.2)
Sodium: 138 mmol/L (ref 134–144)
eGFR: 65 mL/min/1.73

## 2024-01-14 ENCOUNTER — Telehealth: Payer: Self-pay | Admitting: Family Medicine

## 2024-01-14 ENCOUNTER — Ambulatory Visit: Payer: Self-pay | Admitting: Pulmonary Disease

## 2024-01-14 NOTE — Telephone Encounter (Unsigned)
 Copied from CRM #[id number]. Topic: General - Other >> Jan 14, 2024  9:44 AM Sophia H wrote: Reason for CRM: patient states he is needing Dr. Zollie to fill out his surgery clearance, will be dropping form off in office.  Advised Dr. Zollie is out on personal leave till 01/19.[email or web address]

## 2024-01-14 NOTE — H&P (Signed)
 TOTAL HIP ADMISSION H&P  Patient is admitted for right total hip arthroplasty.  Subjective:  Chief Complaint: Right hip pain  HPI: Luke Davila, 59 y.o. male, has a history of pain and functional disability in the right hip due to arthritis and patient has failed non-surgical conservative treatments for greater than 12 weeks to include use of assistive devices and activity modification. Onset of symptoms was gradual, starting several years ago with gradually worsening course since that time. The patient noted no past surgery on the right hip. Patient currently rates pain in the right hip at 9 out of 10 with activity. Patient has worsening of pain with activity and weight bearing, pain that interfers with activities of daily living, and pain with passive range of motion. Patient has evidence of a very large avascular segment of the right femoral head by imaging studies. This condition presents safety issues increasing the risk of falls. There is no current active infection.  Patient Active Problem List   Diagnosis Date Noted   Left lower quadrant pain 10/08/2022   History of anemia due to CKD 02/07/2022   Pulmonary fibrosis (HCC) 11/27/2020   Atherosclerosis of aorta 11/27/2020   Atherosclerosis of right carotid artery 11/27/2020   BPH (benign prostatic hyperplasia) 10/05/2020   Obstructive uropathy 10/05/2020   AKI (acute kidney injury) 10/05/2020   Sarcoidosis of lung 09/30/2019   Primary insomnia 02/14/2019   Bilateral primary osteoarthritis of knee 03/24/2018   PVC's (premature ventricular contractions) 02/03/2018   Hyperlipidemia 02/03/2018   Hyponatremia 08/06/2016   Constipation 10/29/2015   OA (osteoarthritis) of hip 04/09/2015   Hypogonadism in male 09/12/2014   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 09/29/2013   Visual field loss 09/29/2013   Erectile dysfunction 06/25/2012   Gout 06/25/2012    Past Medical History:  Diagnosis Date   Anemia of chronic renal failure,  unspecified CKD stage    Arthritis    Avascular necrosis of hip (HCC)    LEFT   Enlarged prostate    Glaucoma    Gout    History of gout    Pneumonia     Past Surgical History:  Procedure Laterality Date   COLONOSCOPY WITH PROPOFOL  N/A 11/27/2015   Procedure: COLONOSCOPY WITH PROPOFOL ;  Surgeon: Margo LITTIE Haddock, MD;  [facility];  Service: Endoscopy;  Laterality: N/A;  1100   ENDOBRONCHIAL ULTRASOUND Bilateral 07/17/2021   Procedure: ENDOBRONCHIAL ULTRASOUND;  Surgeon: Mannam, [name], MD;  Location: [facility];  Service: Cardiopulmonary;  Laterality: Bilateral;   FINE NEEDLE ASPIRATION  07/17/2021   Procedure: FINE NEEDLE ASPIRATION (FNA) LINEAR;  Surgeon: Mannam, [name], MD;  Location: [facility];  Service: Cardiopulmonary;;   FOOT SURGERY Left 01/07/1983   Left hand surgery     TOTAL HIP ARTHROPLASTY Left 04/09/2015   Procedure: LEFT TOTAL HIP ARTHROPLASTY ANTERIOR APPROACH;  Surgeon: Dempsey Moan, MD;  Location[facility];  Service: Orthopedics;  Laterality: Left;   TRANSURETHRAL RESECTION OF PROSTATE N/A 03/27/2021   Procedure: BIPOLARTRANSURETHRAL RESECTION OF THE PROSTATE (TURP);  Surgeon: Devere Lonni Righter, MD;  Location[facility];  Service: Urology;  Laterality: N/A;   VIDEO BRONCHOSCOPY  07/17/2021   Procedure: VIDEO BRONCHOSCOPY WITHOUT FLUORO;  Surgeon: Mannam, [name], MD;  [facility];  Service: Cardiopulmonary;;   WISDOM TOOTH EXTRACTION      Prior to Admission medications  Medication Sig Start Date End Date Taking? Authorizing Provider  baclofen  (LIORESAL ) 10 MG tablet Take 1 tablet in the morning, 1 tablet in  the afternoon, and 2 tablets at bedtime. 06/16/23   Patel, Donika K, DO  folic acid  (FOLVITE ) 1 MG tablet TAKE 1 TABLET BY MOUTH EVERY DAY 11/24/23   Mannam, [name], MD  naproxen  (NAPROSYN ) 500 MG tablet Take 1 tablet (500 mg total) by mouth 2 (two) times daily with a meal. 01/11/24   Gladis, Mary-Margaret, FNP  rosuvastatin  (CRESTOR )  10 MG tablet Take 1 tablet (10 mg total) by mouth daily. 12/14/23   Zollie Lowers, MD  tadalafil (CIALIS) 5 MG tablet Take 5 mg by mouth daily as needed. 01/20/23   [provider]  tamsulosin  (FLOMAX ) 0.4 MG CAPS capsule Take 0.4 mg by mouth daily after breakfast. 09/25/14   [provider]  testosterone  cypionate (DEPOTESTOSTERONE CYPIONATE) 200 MG/ML injection INJECT 1 ML (200 MG TOTAL) INTO THE MUSCLE EVERY 14 (FOURTEEN) DAYS. 09/29/23   Zollie Lowers, MD    Allergies[1]  Social History   Socioeconomic History   Marital status: Divorced    Spouse name: Ramona   Number of children: 2   Years of education: 12th   Highest education level: Not on file  Occupational History    Employer: [employer]    Comment: Media Planner  Tobacco Use   Smoking status: Never   Smokeless tobacco: Never  Vaping Use   Vaping status: Never Used  Substance and Sexual Activity   Alcohol use: Yes    Comment: beer on occasion   Drug use: No   Sexual activity: Yes    Birth control/protection: None  Other Topics Concern   Not on file  Social History Narrative   Patient lives at home with his family.Caffeine Use: 1 soda dailyPatient is right handed      Are you right handed or left handed? Right Handed   Are you currently employed ? 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 Yes   Who lives with you?    What type of home do you live in: 1 story or 2 story? Lives in one story home       Social Drivers of Health   Tobacco Use: Low Risk (01/11/2024)   Patient History    Smoking Tobacco Use: Never    Smokeless Tobacco Use: Never    Passive Exposure: Not on file  Financial Resource Strain: Patient Declined (12/14/2023)   Overall Financial Resource Strain (CARDIA)    Difficulty of Paying Living Expenses: Patient declined  Food Insecurity: Patient Declined (12/14/2023)   Epic    Worried About Programme Researcher, Broadcasting/film/video in the Last Year: Patient declined     Barista in the Last Year: Patient declined  Transportation Needs: Patient Declined (12/14/2023)   Epic    Lack of Transportation (Medical): Patient declined    Lack of Transportation (Non-Medical): Patient declined  Physical Activity: Unknown (12/14/2023)   Exercise Vital Sign    Days of Exercise per Week: Patient declined    Minutes of Exercise per Session: Not on file  Stress: Patient Declined (12/14/2023)   [employer] of Occupational Health - Occupational Stress Questionnaire    Feeling of Stress: Patient declined  Social Connections: Unknown (12/14/2023)   Social Connection and Isolation Panel    Frequency of Communication with Friends and Family: Patient declined    Frequency of Social Gatherings with Friends and Family: Patient declined    Attends Religious Services: Patient declined    Active Member of Clubs or Organizations: Patient declined  Attends Banker Meetings: Not on file    Marital Status: Divorced  Intimate Partner Violence: Unknown (04/08/2021)   Received from [facility]   HITS    Physically Hurt: Not on file    Insult or Talk Down To: Not on file    Threaten Physical Harm: Not on file    Scream or Curse: Not on file  Depression (PHQ2-9): Low Risk (01/11/2024)   Depression (PHQ2-9)    PHQ-2 Score: 0  Recent Concern: Depression (PHQ2-9) - Medium Risk (10/20/2023)   Depression (PHQ2-9)    PHQ-2 Score: 5  Alcohol Screen: Low Risk (12/14/2023)   Alcohol Screen    Last Alcohol Screening Score (AUDIT): 2  Housing: Patient Declined (12/14/2023)   Epic    Unable to Pay for Housing in the Last Year: Patient declined    Number of Times Moved in the Last Year: Not on file    Homeless in the Last Year: Patient declined  Utilities: Not on file  Health Literacy: Not on file    Tobacco Use: Low Risk (01/11/2024)   Patient History    Smoking Tobacco Use: Never    Smokeless Tobacco Use: Never    Passive Exposure: Not on file   Social  History   Substance and Sexual Activity  Alcohol Use Yes   Comment: beer on occasion    Family History  Problem Relation Age of Onset   Hypertension Mother    Colon cancer Neg Hx     ROS   Objective:  Physical Exam: - Constitutional: Well-developed male, alert, oriented, no apparent distress.  Musculoskeletal:  - Left hip: Normal range of motion without discomfort.  - Right hip: Flexion to 100, minimal internal rotation, 10 of external rotation, 10-20 of abduction.  - Gait: Significant antalgic gait pattern involving both legs.  - Left knee: Flexion contracture noted, contributing to gait abnormalities.  IMAGING:  - MRI of the right hip demonstrates a very large avascular segment of the right femoral head. Some areas appear chronic, while others show edema, indicating acute changes. A small effusion is present in the right hip. No collapse is noted.  Assessment/Plan:  End stage arthritis, right hip  The patient history, physical examination, clinical judgement of the provider and imaging studies are consistent with end stage degenerative joint disease of the right hip and total hip arthroplasty is deemed medically necessary. The treatment options including medical management, injection therapy, arthroscopy and arthroplasty were discussed at length. The risks and benefits of total hip arthroplasty were presented and reviewed. The risks due to aseptic loosening, infection, stiffness, dislocation/subluxation, thromboembolic complications and other imponderables were discussed. The patient acknowledged the explanation, agreed to proceed with the plan and consent was signed. Patient is being admitted for inpatient treatment for surgery, pain control, PT, OT, prophylactic antibiotics, VTE prophylaxis, progressive ambulation and ADLs and discharge planning.The patient is planning to be discharged home.   Patient's anticipated LOS is less than 2 midnights, meeting these  requirements: - Younger than 88 - Lives within 1 hour of care - Has a competent adult at home to recover with post-op recover - NO history of  - Chronic pain requiring opiods  - Diabetes  - Coronary Artery Disease  - Heart failure  - Heart attack  - Stroke  - DVT/VTE  - Cardiac arrhythmia  - Renal failure  - Anemia  - Advanced Liver disease  Therapy Plans: HEP Disposition: Home with ex-wife and children Planned DVT Prophylaxis: Aspirin  81mg  BID  DME Needed: Vannie ordered PCP: Butler Der, MD (pending**) Pulmonologist: [name] Mannam, MD (clearance received) TXA: IV Allergies: NKDA  Anesthesia Concerns: None BMI: 27 Last HgbA1c: Not diabetic Pain Regimen: Hydrocodone , tramadol  Pharmacy[facility]  Other: -Hx interstitial lung disease -Pt will contact PCP for clearance, form provided today.  - Patient was instructed on what medications to stop prior to surgery. - Follow-up visit in 2 weeks with Dr. Melodi - Begin physical therapy following surgery - Pre-operative lab work as pre-surgical testing - Prescriptions will be provided in hospital at time of discharge  Corean Sender, PA-C Orthopedic Surgery [facility] [place]      [1] No Known Allergies

## 2024-01-15 ENCOUNTER — Telehealth: Payer: Self-pay | Admitting: *Deleted

## 2024-01-15 NOTE — Telephone Encounter (Signed)
 Result Note Can you call patient and make clinic visit with me at next available time Luke Davila, [name], Luke Davila to Luke Davila     01/14/24  9:39 AM Liver test continue to be elevated.  I am going to make a referral to gastroenterology for evaluation of elevated liver tests and I will also make a follow-up clinic visit with me to discuss alternate treatment for sarcoidosis  -------------------------------------------------------------------------------------------------------------------------  I called and spoke with patient, provided information per Dr. Theophilus.  He verbalized understanding. He will call back on Monday to schedule a f/u appointment when he has his calendar.  He  has some upcoming appointments.  Nothing further needed.

## 2024-01-18 NOTE — Patient Instructions (Signed)
 SURGICAL WAITING ROOM VISITATION  Patients having surgery or a procedure may have no more than 2 support people in the waiting area - these visitors may rotate.    Children ages 16 and under will not be able to visit patients in [facility] under most circumstances.   Visitors with respiratory illnesses are discouraged from visiting and should remain at home.  If the patient needs to stay at the hospital during part of their recovery, the visitor guidelines for inpatient rooms apply. Pre-op nurse will coordinate an appropriate time for 1 support person to accompany patient in pre-op.  This support person may not rotate.    Please refer to the [facility] website for the visitor guidelines for Inpatients (after your surgery is over and you are in a regular room).       Your procedure is scheduled on:  01/27/2024    Report to [facility] Main Entrance    Report to admitting at  0830 AM   Call this number if you have problems the morning of surgery [phone]   Do not eat food :After Midnight.   After Midnight you may have the following liquids until _ 0800_____ AM  DAY OF SURGERY  Water Non-Citrus Juices (without pulp, NO RED-Apple, White grape, White cranberry) Black Coffee (NO MILK/CREAM OR CREAMERS, sugar ok)  Clear Tea (NO MILK/CREAM OR CREAMERS, sugar ok) regular and decaf                             Plain Jell-O (NO RED)                                           Fruit ices (not with fruit pulp, NO RED)                                     Popsicles (NO RED)                                                               Sports drinks like Gatorade (NO RED)                   The day of surgery:  Drink ONE (1) Pre-Surgery Clear Ensure or G2 at   0800AM the morning of surgery. Drink in one sitting. Do not sip.  This drink was given to you during your hospital  pre-op appointment visit. Nothing else to drink after completing the  Pre-Surgery Clear Ensure or  G2.          If you have questions, please contact your surgeons office.       Oral Hygiene is also important to reduce your risk of infection.                                    Remember - BRUSH YOUR TEETH THE MORNING OF SURGERY WITH YOUR REGULAR TOOTHPASTE  DENTURES WILL BE REMOVED PRIOR TO SURGERY PLEASE DO NOT APPLY  Poly grip OR ADHESIVES!!!   Do NOT smoke after Midnight   Stop all vitamins and herbal supplements 7 days before surgery.   Take these medicines the morning of surgery with A SIP OF WATER:  flomax    DO NOT TAKE ANY ORAL DIABETIC MEDICATIONS DAY OF YOUR SURGERY  Bring CPAP mask and tubing day of surgery.                              You may not have any metal on your body including hair pins, jewelry, and body piercing             Do not wear make-up, lotions, powders, perfumes/cologne, or deodorant  Do not wear nail polish including gel and S&S, artificial/acrylic nails, or any other type of covering on natural nails including finger and toenails. If you have artificial nails, gel coating, etc. that needs to be removed by a nail salon please have this removed prior to surgery or surgery may need to be canceled/ delayed if the surgeon/ anesthesia feels like they are unable to be safely monitored.   Do not shave  48 hours prior to surgery.               Men may shave face and neck.   Do not bring valuables to the hospital. [facility] IS NOT             RESPONSIBLE   FOR VALUABLES.   Contacts, glasses, dentures or bridgework may not be worn into surgery.   Bring small overnight bag day of surgery.   DO NOT BRING YOUR HOME MEDICATIONS TO THE HOSPITAL. PHARMACY WILL DISPENSE MEDICATIONS LISTED ON YOUR MEDICATION LIST TO YOU DURING YOUR ADMISSION IN THE HOSPITAL!    Patients discharged on the day of surgery will not be allowed to drive home.  Someone NEEDS to stay with you for the first 24 hours after anesthesia.   Special Instructions: Bring a copy of your  healthcare power of attorney and living will documents the day of surgery if you haven't scanned them before.              Please read over the following fact sheets you were given: IF YOU HAVE QUESTIONS ABOUT YOUR PRE-OP INSTRUCTIONS PLEASE CALL [phone].   If you received a COVID test during your pre-op visit  it is requested that you wear a mask when out in public, stay away from anyone that may not be feeling well and notify your surgeon if you develop symptoms. If you test positive for Covid or have been in contact with anyone that has tested positive in the last 10 days please notify you surgeon.      Pre-operative 4 CHG Bath Instructions   You can play a key role in reducing the risk of infection after surgery. Your skin needs to be as free of germs as possible. You can reduce the number of germs on your skin by washing with CHG (chlorhexidine  gluconate) soap before surgery. CHG is an antiseptic soap that kills germs and continues to kill germs even after washing.   DO NOT use if you have an allergy to chlorhexidine /CHG or antibacterial soaps. If your skin becomes reddened or irritated, stop using the CHG and notify one of our RNs at [phone].   Please shower with the CHG soap starting 4 days before surgery using the following schedule:     Please keep in  mind the following:  DO NOT shave, including legs and underarms, starting the day of your first shower.   You may shave your face at any point before/day of surgery.  Place clean sheets on your bed the day you start using CHG soap. Use a clean washcloth (not used since being washed) for each shower. DO NOT sleep with pets once you start using the CHG.   CHG Shower Instructions:  If you choose to wash your hair and private area, wash first with your normal shampoo/soap.  After you use shampoo/soap, rinse your hair and body thoroughly to remove shampoo/soap residue.  Turn the water OFF and apply about 3 tablespoons (45 ml) of  CHG soap to a CLEAN washcloth.  Apply CHG soap ONLY FROM YOUR NECK DOWN TO YOUR TOES (washing for 3-5 minutes)  DO NOT use CHG soap on face, private areas, open wounds, or sores.  Pay special attention to the area where your surgery is being performed.  If you are having back surgery, having someone wash your back for you may be helpful. Wait 2 minutes after CHG soap is applied, then you may rinse off the CHG soap.  Pat dry with a clean towel  Put on clean clothes/pajamas   If you choose to wear lotion, please use ONLY the CHG-compatible lotions on the back of this paper.     Additional instructions for the day of surgery: DO NOT APPLY any lotions, deodorants, cologne, or perfumes.   Put on clean/comfortable clothes.  Brush your teeth.  Ask your nurse before applying any prescription medications to the skin.      CHG Compatible Lotions   Aveeno Moisturizing lotion  Cetaphil Moisturizing Cream  Cetaphil Moisturizing Lotion  Clairol Herbal Essence Moisturizing Lotion, Dry Skin  Clairol Herbal Essence Moisturizing Lotion, Extra Dry Skin  Clairol Herbal Essence Moisturizing Lotion, Normal Skin  Curel Age Defying Therapeutic Moisturizing Lotion with Alpha Hydroxy  Curel Extreme Care Body Lotion  Curel Soothing Hands Moisturizing Hand Lotion  Curel Therapeutic Moisturizing Cream, Fragrance-Free  Curel Therapeutic Moisturizing Lotion, Fragrance-Free  Curel Therapeutic Moisturizing Lotion, Original Formula  Eucerin Daily Replenishing Lotion  Eucerin Dry Skin Therapy Plus Alpha Hydroxy Crme  Eucerin Dry Skin Therapy Plus Alpha Hydroxy Lotion  Eucerin Original Crme  Eucerin Original Lotion  Eucerin Plus Crme Eucerin Plus Lotion  Eucerin TriLipid Replenishing Lotion  Keri Anti-Bacterial Hand Lotion  Keri Deep Conditioning Original Lotion Dry Skin Formula Softly Scented  Keri Deep Conditioning Original Lotion, Fragrance Free Sensitive Skin Formula  Keri Lotion Fast Absorbing  Fragrance Free Sensitive Skin Formula  Keri Lotion Fast Absorbing Softly Scented Dry Skin Formula  Keri Original Lotion  Keri Skin Renewal Lotion Keri Silky Smooth Lotion  Keri Silky Smooth Sensitive Skin Lotion  Nivea Body Creamy Conditioning Oil  Nivea Body Extra Enriched Teacher, Adult Education Moisturizing Lotion Nivea Crme  Nivea Skin Firming Lotion  NutraDerm 30 Skin Lotion  NutraDerm Skin Lotion  NutraDerm Therapeutic Skin Cream  NutraDerm Therapeutic Skin Lotion  ProShield Protective Hand Cream  Provon moisturizing lotion

## 2024-01-18 NOTE — Progress Notes (Signed)
 Anesthesia Review:  PCP: Luke Davila dated 01/21/24 in media Tab dated 01/21/24.  Luke Davila LOV 01/11/24  Cardiologist : none Pulm- Luke Davila LOV 09/01/23 - clearance dated 12/15/23 in Media tab dated 01/21/24.  Nephrology - [facility] DR Luke Davila LOV 10/19/23   PPM/ ICD: Device Orders: Rep Notified:  Chest x-ray : EKG : 02/16/2023  PFT-08/03/23  Echo : 07/30/23  CT Chest- 06/03/23  Stress test: Cardiac Cath :   Activity level: can do a flight of stairs without difficutly  Sleep Study/ CPAP : none  Fasting Blood Sugar :      / Checks Blood Sugar -- times a day:    Blood Thinner/ Instructions /Last Dose: ASA / Instructions/ Last Dose :

## 2024-01-19 ENCOUNTER — Telehealth: Payer: Self-pay | Admitting: Family Medicine

## 2024-01-19 NOTE — Telephone Encounter (Signed)
 Patient scheduled to see Oneil Severin on 1/15 for surgery clearance. Patient aware.

## 2024-01-19 NOTE — Telephone Encounter (Unsigned)
 Copied from CRM #[id number]. Topic: General - Other >> Jan 19, 2024 11:42 AM Debby BROCKS wrote: Reason for CRM: Corean PA from [facility] called in stating that the patient has a surgery on the 21st and they need to receive the surgery clearance that was sent to [facility].  They are aware Dr. Zollie is OOO and want to know if a different provider can sign and return the clearance due to the time sensitivity  Callback: [phone] Fax: [phone]

## 2024-01-19 NOTE — Telephone Encounter (Unsigned)
 Copied from CRM #[id number]. Topic: General - Other >> Jan 19, 2024 11:42 AM Debby BROCKS wrote: Reason for CRM: Corean PA from [facility] called in stating that the patient has a surgery on the 21st and they need to receive the surgery clearance that was sent to [facility].  They are aware Dr. Zollie is OOO and want to know if a different provider can sign and return the clearance due to the time sensitivity  Callback: [phone] Fax: [phone] >> Jan 19, 2024 12:42 PM Sophia H wrote: Patient needs a call back today by end of day to let him know when this will be taken care of, patient is very frustrated and states no one wants to help with his clearance form. States form was dropped off in office. # [phone] Already advised patient clinic sent a message to nurse just waiting on response.

## 2024-01-19 NOTE — Telephone Encounter (Signed)
 Per Ronal Lunger, patient needs to be seen before she can fill out surgery clearance form since she did not clear him for surgery and Dr Zollie is out of the office until 1/19 so he is unable to fill it out. Nurse Lorn will call patient to explain this and get patient scheduled.

## 2024-01-19 NOTE — Telephone Encounter (Unsigned)
 Copied from CRM #[id number]. Topic: General - Other >> Jan 19, 2024 11:42 AM Debby BROCKS wrote: Reason for CRM: Corean PA from [facility] called in stating that the patient has a surgery on the 21st and they need to receive the surgery clearance that was sent to [facility].  They are aware Dr. Zollie is OOO and want to know if a different provider can sign and return the clearance due to the time sensitivity  Callback: [phone] Fax: [phone] >> Jan 19, 2024  4:39 PM Susanna ORN wrote: Patient, along with spouse, calling wanting to have a video visit instead of coming in person to the office. Spouse is messaging via MyChart with office staff while patient is calling in about the same issue. Warm transferred patient to CAL line.  >> Jan 19, 2024  2:52 PM CMA Chelsea B wrote: Duplicate message. Nurse and provider already aware.  >> Jan 19, 2024  1:58 PM Montie POUR wrote: Mr. Cinquemani is calling back to see if he can get someone from this clinic to call him back at [phone] and discuss when this can be sent over to [facility]. The need this as soon as possible.  >> Jan 19, 2024 12:42 PM Sophia H wrote: Patient needs a call back today by end of day to let him know when this will be taken care of, patient is very frustrated and states no one wants to help with his clearance form. States form was dropped off in office. # [phone] Already advised patient clinic sent a message to nurse just waiting on response.

## 2024-01-19 NOTE — Telephone Encounter (Signed)
 Scheduled for 1/15 for surgical clearance. Will be completed at that time.

## 2024-01-20 ENCOUNTER — Encounter (HOSPITAL_COMMUNITY)
Admission: RE | Admit: 2024-01-20 | Discharge: 2024-01-20 | Disposition: A | Discharge status: Home / Self Care | Source: Ambulatory Visit | Attending: Orthopedic Surgery | Admitting: Orthopedic Surgery

## 2024-01-20 ENCOUNTER — Other Ambulatory Visit: Payer: Self-pay

## 2024-01-20 ENCOUNTER — Ambulatory Visit: Payer: Self-pay | Admitting: Nurse Practitioner

## 2024-01-20 ENCOUNTER — Encounter (HOSPITAL_COMMUNITY): Payer: Self-pay

## 2024-01-20 VITALS — BP 137/84 | HR 74 | Temp 97.8°F | Resp 16 | Ht 75.0 in | Wt 209.4 lb

## 2024-01-20 DIAGNOSIS — D869 Sarcoidosis, unspecified: Secondary | ICD-10-CM | POA: Diagnosis not present

## 2024-01-20 DIAGNOSIS — I4510 Unspecified right bundle-branch block: Secondary | ICD-10-CM | POA: Insufficient documentation

## 2024-01-20 DIAGNOSIS — Z01812 Encounter for preprocedural laboratory examination: Secondary | ICD-10-CM | POA: Diagnosis present

## 2024-01-20 DIAGNOSIS — M1611 Unilateral primary osteoarthritis, right hip: Secondary | ICD-10-CM | POA: Diagnosis not present

## 2024-01-20 DIAGNOSIS — Z01818 Encounter for other preprocedural examination: Secondary | ICD-10-CM

## 2024-01-20 LAB — CBC
HCT: 35.3 % — ABNORMAL LOW (ref 39.0–52.0)
Hemoglobin: 11.1 g/dL — ABNORMAL LOW (ref 13.0–17.0)
MCH: 25.5 pg — ABNORMAL LOW (ref 26.0–34.0)
MCHC: 31.4 g/dL (ref 30.0–36.0)
MCV: 81 fL (ref 80.0–100.0)
Platelets: 274 K/uL (ref 150–400)
RBC: 4.36 MIL/uL (ref 4.22–5.81)
RDW: 12.8 % (ref 11.5–15.5)
WBC: 5.7 K/uL (ref 4.0–10.5)
nRBC: 0 % (ref 0.0–0.2)

## 2024-01-20 LAB — BASIC METABOLIC PANEL WITH GFR
Anion gap: 9 (ref 5–15)
BUN: 14 mg/dL (ref 6–20)
CO2: 27 mmol/L (ref 22–32)
Calcium: 9.5 mg/dL (ref 8.9–10.3)
Chloride: 96 mmol/L — ABNORMAL LOW (ref 98–111)
Creatinine, Ser: 1.16 mg/dL (ref 0.61–1.24)
GFR, Estimated: >60 mL/min
Glucose, Bld: 80 mg/dL (ref 70–99)
Potassium: 4.6 mmol/L (ref 3.5–5.1)
Sodium: 131 mmol/L — ABNORMAL LOW (ref 135–145)

## 2024-01-20 LAB — SURGICAL PCR SCREEN
MRSA, PCR: NEGATIVE
Staphylococcus aureus: NEGATIVE

## 2024-01-20 NOTE — Telephone Encounter (Signed)
 Pt was scheduled an appt for 01/21/24

## 2024-01-21 ENCOUNTER — Encounter (HOSPITAL_COMMUNITY): Payer: Self-pay

## 2024-01-21 ENCOUNTER — Ambulatory Visit: Admitting: Family Medicine

## 2024-01-21 ENCOUNTER — Telehealth: Payer: Self-pay

## 2024-01-21 VITALS — BP 117/78 | HR 73 | Temp 98.3°F | Ht 75.0 in | Wt 212.0 lb

## 2024-01-21 DIAGNOSIS — Z01818 Encounter for other preprocedural examination: Secondary | ICD-10-CM | POA: Diagnosis not present

## 2024-01-21 DIAGNOSIS — D860 Sarcoidosis of lung: Secondary | ICD-10-CM | POA: Diagnosis not present

## 2024-01-21 DIAGNOSIS — M2550 Pain in unspecified joint: Secondary | ICD-10-CM | POA: Insufficient documentation

## 2024-01-21 DIAGNOSIS — M179 Osteoarthritis of knee, unspecified: Secondary | ICD-10-CM | POA: Insufficient documentation

## 2024-01-21 DIAGNOSIS — M25551 Pain in right hip: Secondary | ICD-10-CM | POA: Diagnosis not present

## 2024-01-21 DIAGNOSIS — E669 Obesity, unspecified: Secondary | ICD-10-CM | POA: Insufficient documentation

## 2024-01-21 LAB — ECHOCARDIOGRAM COMPLETE
Area-P 1/2: 3.42 cm2
S' Lateral: 3.2 cm

## 2024-01-21 NOTE — Progress Notes (Signed)
 "  Luke Davila is a 59 y.o. male who is here for preoperative clearance for R hip replacement scheduled for 01/27/24.   Discussed the use of AI scribe software for clinical note transcription with the patient, who gave verbal consent to proceed.  History of Present Illness    Meds - Patient states that he only takes tamsulosin  for BPH.   History of sarcoidosis - Patient followed by pulmonology with last appointment on 09/01/2023.  The notes state that the patient showed improved lung function with reduced obstruction at that time. Patient reports that he was taken off methotrexate  and only uses albuterol  inhaler as needed.  Patient is to follow-up with pulmonology 6 months.  Cariology  - Last appointment with cardiology was on 02/16/2023 and echo was performed and showed no signs of sarcoidosis.  Showed good pumping function of the heart per the cardiologist.  Ortho - Patient has a history of left hip replacement  Patient states that his lungs are not the limiting factor to difficulty with activities but instead it is his right hip pain that is.    1) High Risk Cardiac Conditions  1) Recent MI - No.  2) Decompensated Heart Failure - No.  3) Unstable angina - No.  4) Symptomatic arrythmia - No.  5) Sx Valvular Disease - No.  2) Intermediate Risk Factors - DM, CKD, CVA, CHF, CAD - Yes.  Hx of CKD but last BMP from yesterday showed normal creatinine and GFR.   2) Functional Status - > 4 mets (Walk, run, climb stairs) Yes.  [name] Activity Status Index: 30.2 points 6.45 METs  3) Surgery Specific Risk - High  (Emergency, Vascular, Intra-abdominal, Extensive ops)          Intermediate (Carotid, Head and Neck, Orthopaedic )          Low (Endoscopic, Cataract, Breast ) 4) Further Noninvasive evaluation -   1) EKG - Last EKG was on 02/16/23. Patient states that pre op told him yesterday that he did not need another one performed since it has been less than 1 year since last one.    1)  Hx of CVA, CAD, DM, CKD  2) Echo - Last performed 06/22/23 showed good pumping function of the heart per cardiologist.    1) Worsening dyspnea   3) Stress Testing - Active Cardiac Disease - No.  5) Need for medical therapy - Beta Blocker, Statins indicated ? No.  PE: Vitals:   01/21/24 0918  BP: 117/78  Pulse: 73  Temp: 98.3 F (36.8 C)  SpO2: 98%   Physical Examination: General appearance - alert, well appearing, and in no distress and oriented to person, place, and time Mental status - alert, oriented to person, place, and time, normal mood, behavior, speech, dress, motor activity, and thought processes Eyes - pupils equal and reactive, extraocular eye movements intact Chest - clear to auscultation, no wheezes, rales or rhonchi, symmetric air entry Heart - normal rate, regular rhythm, normal S1, S2, no murmurs, rubs, clicks or gallops Neurological - alert, oriented, normal speech, no focal findings or movement disorder noted Musculoskeletal - Walking with cane.  Extremities - peripheral pulses normal, no pedal edema, no clubbing or cyanosis Skin - normal coloration and turgor, no rashes, no suspicious skin lesions noted  Preoperative clearance  Right hip pain  Sarcoidosis of lung Assessment and Plan Assessment & Plan      I have independently evaluated patient.  Luke Davila is a 59  y.o. male who is medium risk surgery.  There are not modifiable risk factors (smoking, etc).   CXR (if asx/healthy/no resp issues don't need). N/A  EKG (?h/o MI, CAD, etc; usually don't need for low risk sx).  - Performed 02/16/23.  Patient states that preop told him yesterday that he did not need another 1 since it was done within the past year.  PFTs (significant cardiopulm hx? OSA/OHS).  - Last performed 01/26/23. Followed by pulm.  Echo (get if CHF and has not had in >1 year OR if worse HF symptoms).  - Last performed 06/22/23.   Review meds: NO ACE-I/ARB day of surgery. OK to do BB  or statin day of esp if vascular sx)  Oneil Severin, FNP [facility]          "

## 2024-01-21 NOTE — Telephone Encounter (Signed)
 Form faxed to [phone]

## 2024-01-21 NOTE — Telephone Encounter (Signed)
 Copied from CRM #[id number]. Topic: General - Other >> Jan 19, 2024 11:42 AM Debby BROCKS wrote: Reason for CRM: Corean PA from [facility] called in stating that the patient has a surgery on the 21st and they need to receive the surgery clearance that was sent to [facility].  They are aware Dr. Zollie is OOO and want to know if a different provider can sign and return the clearance due to the time sensitivity  Callback: [phone] Fax: [phone] >> Jan 21, 2024  9:52 AM Farrel B wrote: Corean of [facility] called to check status of paperwork  >> Jan 19, 2024  4:39 PM Susanna ORN wrote: Patient, along with spouse, calling wanting to have a video visit instead of coming in person to the office. Spouse is messaging via MyChart with office staff while patient is calling in about the same issue. Warm transferred patient to CAL line.  >> Jan 19, 2024  2:52 PM CMA Chelsea B wrote: Duplicate message. Nurse and provider already aware.  >> Jan 19, 2024  1:58 PM Montie POUR wrote: Mr. Reust is calling back to see if he can get someone from this clinic to call him back at [phone] and discuss when this can be sent over to [facility]. The need this as soon as possible.  >> Jan 19, 2024 12:42 PM Sophia H wrote: Patient needs a call back today by end of day to let him know when this will be taken care of, patient is very frustrated and states no one wants to help with his clearance form. States form was dropped off in office. # [phone] Already advised patient clinic sent a message to nurse just waiting on response.

## 2024-01-21 NOTE — Progress Notes (Signed)
 " Case: [id number] Date/Time: 01/27/24 1045   Procedure: ARTHROPLASTY, HIP, TOTAL, ANTERIOR APPROACH (Right: Hip)   Anesthesia type: Choice   Pre-op diagnosis: Right hip osteoarthritis   [facility] ROOM 10 / [facility]   Surgeons: Melodi Lerner, MD       DISCUSSION: Luke Davila is a 59 yo male with PMH of sarcoidosis, RBBB/IVCD, arthritis.  Patient follows with pulmonology for history of sarcoidosis.  Last seen on 09/01/2023 by Dr. Theophilus.  PFTs done in July appear improved. His methotrexate  was stopped due to transaminitis.  Risk assessment done in 12/15/2023 progress note:  Peri-operative Assessment of Pulmonary Risk for Non-Thoracic Surgery: Asked to provide pulmonary risk assessment for planned right knee replacement Patient is being followed in pulmonary clinic for Sarcoidosis By ARISCAT criteria patient has Low risk 1.6% risk of in-hospital post-op pulmonary complications (composite including respiratory failure, respiratory infection, pleural effusion, atelectasis, pneumothorax, bronchospasm, aspiration pneumonitis) Methotrexate  is already on hold due to elevated LFTs which is being monitored Risk of perioperative pulmonary complications is increased by:             Interstitial lung dsiease  Patient follows with cardiology for history of RVH with RBBB and sarcoidosis history.  Last seen by Dr. Alvan on 02/16/2023.  Event monitoring was ordered due to wide-complex rhythm.  This came back without any concerning arrhythmia.  Echo in July 2025 showed normal LVEF, grade 1 diastolic dysfunction.  Seen by family medicine NP on 01/21/24. Cleared medically as moderate risk.  VS: BP 137/84   Pulse 74   Temp 36.6 C (Oral)   Resp 16   Ht 6' 3 (1.905 m)   Wt 95 kg   SpO2 99%   BMI 26.18 kg/m   PROVIDERS: Zollie Lowers, MD   LABS: Labs reviewed: Acceptable for surgery. (all labs ordered are listed, but only abnormal results are displayed)  Labs Reviewed  BASIC METABOLIC PANEL  WITH GFR - Abnormal; Notable for the following components:      Result Value   Sodium 131 (*)    Chloride 96 (*)    All other components within normal limits  CBC - Abnormal; Notable for the following components:   Hemoglobin 11.1 (*)    HCT 35.3 (*)    MCH 25.5 (*)    All other components within normal limits  SURGICAL PCR SCREEN  TYPE AND SCREEN     CT Chest 05/27/23:  IMPRESSION: 1. Mediastinal and upper/midlung zone predominant pulmonary parenchymal findings of sarcoid, stable. 2. Trace pericardial effusion. 3.  Aortic atherosclerosis (ICD10-I70.0). 4. Enlarged pulmonic trunk, indicative of pulmonary arterial hypertension.    EKG 02/16/23:  Wide QRS rhythm LAD RBBB  Echo 08/04/23:  IMPRESSIONS    1. Left ventricular ejection fraction, by estimation, is 60 to 65%. The left ventricle has normal function. The left ventricle has no regional wall motion abnormalities. There is mild concentric left ventricular hypertrophy. Left ventricular diastolic parameters are consistent with Grade I diastolic dysfunction (impaired relaxation).  2. Right ventricular systolic function is normal. The right ventricular size is normal.  3. The mitral valve is normal in structure. Trivial mitral valve regurgitation. No evidence of mitral stenosis.  4. The aortic valve is tricuspid. There is moderate calcification of the aortic valve. There is mild thickening of the aortic valve. Aortic valve regurgitation is not visualized. Aortic valve sclerosis/calcification is present, without any evidence of aortic stenosis.  5. The inferior vena cava is normal in size with greater than 50% respiratory  variability, suggesting right atrial pressure of 3 mmHg.  Event monitor 03/11/2023:  1 triggered event for sinus tachycardia. No significant tachyarrhythmia or bradyarrhythmia. No atrial fibrillation or flutter.   Patch Wear Time:  3 days and 22 hours (2025-02-23T14:24:37-0500 to  2025-02-27T13:02:26-0500)   Patient had a min HR of 54 bpm, max HR of 185 bpm, and avg HR of 79 bpm. Predominant underlying rhythm was Sinus Rhythm. Bundle Branch Block/IVCD was present. 2 Supraventricular Tachycardia runs occurred, the run with the fastest interval lasting 8 mins  11 secs with a max rate of 185 bpm, the longest lasting 2 hours 30 mins with an avg rate of 98 bpm. Some episodes of Supraventricular Tachycardia conducted with possible aberrancy. Isolated SVEs were rare (<1.0%), and no SVE Couplets or SVE Triplets were  present. Isolated VEs were rare (<1.0%), VE Couplets were rare (<1.0%), and no VE Triplets were present. Ventricular Trigeminy was present.  Past Medical History:  Diagnosis Date   Anemia of chronic renal failure, unspecified CKD stage    Arthritis    Avascular necrosis of hip (HCC)    LEFT   Enlarged prostate    Glaucoma    Gout    History of gout     Past Surgical History:  Procedure Laterality Date   COLONOSCOPY WITH PROPOFOL  N/A 11/27/2015   Procedure: COLONOSCOPY WITH PROPOFOL ;  Surgeon: Margo LITTIE Haddock, MD;  [facility];  Service: Endoscopy;  Laterality: N/A;  1100   ENDOBRONCHIAL ULTRASOUND Bilateral 07/17/2021   Procedure: ENDOBRONCHIAL ULTRASOUND;  Surgeon: Mannam, [name], MD;  [facility];  Service: Cardiopulmonary;  Laterality: Bilateral;   FINE NEEDLE ASPIRATION  07/17/2021   Procedure: FINE NEEDLE ASPIRATION (FNA) LINEAR;  Surgeon: Mannam, [name], MD;  [facility];  Service: Cardiopulmonary;;   FOOT SURGERY Left 01/07/1983   Left hand surgery     TOTAL HIP ARTHROPLASTY Left 04/09/2015   Procedure: LEFT TOTAL HIP ARTHROPLASTY ANTERIOR APPROACH;  Surgeon: Dempsey Moan, MD;  [facility];  Service: Orthopedics;  Laterality: Left;   TRANSURETHRAL RESECTION OF PROSTATE N/A 03/27/2021   Procedure: BIPOLARTRANSURETHRAL RESECTION OF THE PROSTATE (TURP);  Surgeon: Devere Lonni Righter, MD;  [facility];   Service: Urology;  Laterality: N/A;   VIDEO BRONCHOSCOPY  07/17/2021   Procedure: VIDEO BRONCHOSCOPY WITHOUT FLUORO;  Surgeon: Mannam, [name], MD;  [facility];  Service: Cardiopulmonary;;   WISDOM TOOTH EXTRACTION      MEDICATIONS:  dorzolamide-timolol (COSOPT) 2-0.5 % ophthalmic solution   folic acid  (FOLVITE ) 1 MG tablet   naproxen  (NAPROSYN ) 500 MG tablet   prednisoLONE acetate (PRED FORTE) 1 % ophthalmic suspension   rosuvastatin  (CRESTOR ) 10 MG tablet   tamsulosin  (FLOMAX ) 0.4 MG CAPS capsule   testosterone  cypionate (DEPOTESTOSTERONE CYPIONATE) 200 MG/ML injection    testosterone  cypionate (DEPOTESTOSTERONE CYPIONATE) injection 200 mg    Azarya Oconnell M Kyri Dai, PA-C [facility] Short Stay/Anesthesiology [facility] Phone [phone] 01/22/2024 8:41 AM       "

## 2024-01-22 NOTE — Anesthesia Preprocedure Evaluation (Signed)
"                                    Anesthesia Evaluation    Airway        Dental   Pulmonary           Cardiovascular      Neuro/Psych    GI/Hepatic   Endo/Other    Renal/GU      Musculoskeletal   Abdominal   Peds  Hematology   Anesthesia Other Findings   Reproductive/Obstetrics                              Anesthesia Physical Anesthesia Plan  ASA:   Anesthesia Plan:    Post-op Pain Management:    Induction:   PONV Risk Score and Plan:   Airway Management Planned:   Additional Equipment:   Intra-op Plan:   Post-operative Plan:   Informed Consent:   Plan Discussed with:   Anesthesia Plan Comments: (See PAT note from 1/14)         Anesthesia Quick Evaluation  "

## 2024-01-26 ENCOUNTER — Telehealth: Payer: Self-pay

## 2024-01-26 NOTE — Telephone Encounter (Signed)
 Copied from CRM [id number]. Topic: Appointments - Scheduling Inquiry for Clinic >> Jan 26, 2024  1:35 PM Luke Davila wrote: Reason for CRM:   Pt is contacting clinic to schedule FU w/Mannam due to elevated liver levels on recent labwork. Mannam has avail tomm or Thurs; however, pt requested if he could seen either on 1/26, 1/27, or 1/28 around 4 pm if possible. Next avail for Mannam is 2/2.   Requested call back for assistance with scheduling  CB#  [phone]    Called and spoke with pt. Pt has appt scheduled, NFN.

## 2024-01-27 ENCOUNTER — Encounter (HOSPITAL_COMMUNITY): Admission: RE | Payer: Self-pay | Source: Home / Self Care

## 2024-01-27 ENCOUNTER — Encounter (HOSPITAL_COMMUNITY): Payer: Self-pay | Admitting: Anesthesiology

## 2024-01-27 ENCOUNTER — Encounter (HOSPITAL_COMMUNITY): Payer: Self-pay | Admitting: Medical

## 2024-01-27 ENCOUNTER — Ambulatory Visit (HOSPITAL_COMMUNITY): Admission: RE | Admit: 2024-01-27 | Payer: Self-pay | Source: Home / Self Care | Admitting: Orthopedic Surgery

## 2024-01-27 LAB — TYPE AND SCREEN
ABO/RH(D): B POS
Antibody Screen: NEGATIVE

## 2024-01-27 SURGERY — ARTHROPLASTY, HIP, TOTAL, ANTERIOR APPROACH
Anesthesia: Choice | Site: Hip | Laterality: Right

## 2024-01-28 ENCOUNTER — Other Ambulatory Visit: Payer: Self-pay | Admitting: Neurology

## 2024-01-28 NOTE — Telephone Encounter (Signed)
 Apt scheduled 02/02/2024. nfn

## 2024-01-29 NOTE — Telephone Encounter (Signed)
 Called patient and he informed me that his Lung doctor has taken him off of baclofen  due to his lung function being elevated. Patient stated that he will contact after his Hip surgery that is due to happen in February. Once he is healed up he will call to schedule his EMG.
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
## 2024-02-02 ENCOUNTER — Ambulatory Visit: Admitting: Pulmonary Disease

## 2024-02-02 NOTE — Telephone Encounter (Signed)
 Calling pt to make an apt. No [id number]

## 2024-02-05 ENCOUNTER — Telehealth: Payer: Self-pay

## 2024-02-05 NOTE — Telephone Encounter (Signed)
 Patient wants to how severe his liver function is. Please advice, thanks

## 2024-02-05 NOTE — Telephone Encounter (Signed)
 Copied from CRM #[id number]. Topic: General - Other >> Feb 03, 2024  8:26 AM [name] wrote: Reason for CRM:   Pt returning call from [facility]. [place] this weekend [place] wants to know if a in person visit is necessary OR if he can do a virtual visit with Mannam to discuss.   CB#  [phone] >> Feb 04, 2024 11:33 AM [name] wrote: Pt missed a call from [facility] regarding scheduling an appt. Pt has had elavated liver levels on recent labwork and was supposed to have a hip replacement surgery completed. Pt wanted to know if it was possible to have a VV as soon as possible or if he needed to come into the clinic in person to discuss. Pt had an appt this week scheduled, but had to cancel due to the snow. Pt would like a call back from [facility] to discuss if possible at phone number  [phone]  ok to leave a vm. Pt was told he would get a call yesterday but didn't get a call back.      Routing to [facility] to advise.

## 2024-02-10 NOTE — Telephone Encounter (Signed)
I called and discussed with patient. Nothing further needed 

## 2024-02-11 NOTE — Patient Instructions (Signed)
 SURGICAL WAITING ROOM VISITATION Patients having surgery or a procedure may have no more than 2 support people in the waiting area - these visitors may rotate in the visitor waiting room.   Due to an increase in RSV and influenza rates and associated hospitalizations, children ages 49 and under may not visit patients in [facility] hospitals. If the patient needs to stay at the hospital during part of their recovery, the visitor guidelines for inpatient rooms apply.  PRE-OP VISITATION  Pre-op nurse will coordinate an appropriate time for 1 support person to accompany the patient in pre-op.  This support person may not rotate.  This visitor will be contacted when the time is appropriate for the visitor to come back in the pre-op area.  Please refer to the [facility] website for the visitor guidelines for Inpatients (after your surgery is over and you are in a regular room).  You are not required to quarantine at this time prior to your surgery. However, you must do this: Hand Hygiene often Do NOT share personal items Notify your provider if you are in close contact with someone who has COVID or you develop fever 100.4 or greater, new onset of sneezing, cough, sore throat, shortness of breath or body aches.  If you test positive for Covid or have been in contact with anyone that has tested positive in the last 10 days please notify you surgeon.    Your procedure is scheduled on:  02/22/24  Report to [facility] Main Entrance: [place] entrance where the [employer] is available.   Report to admitting at: 1:15 PM  Call this number if you have any questions or problems the morning of surgery [phone]  FOLLOW ANY ADDITIONAL PRE OP INSTRUCTIONS YOU RECEIVED FROM YOUR SURGEON'S OFFICE!!!  Do not eat food after Midnight the night prior to your surgery/procedure.  After Midnight you may have the following liquids until: 12:45 PM DAY OF SURGERY  Clear Liquid Diet Water Black  Coffee (sugar ok, NO MILK/CREAM OR CREAMERS)  Tea (sugar ok, NO MILK/CREAM OR CREAMERS) regular and decaf                             Plain Jell-O  with no fruit (NO RED)                                           Fruit ices (not with fruit pulp, NO RED)                                     Popsicles (NO RED)                                                                  Juice: NO CITRUS JUICES: only apple, WHITE grape, WHITE cranberry Sports drinks like Gatorade or Powerade (NO RED)   The day of surgery:  Drink ONE (1) Pre-Surgery Clear Ensure at : 12:45 PM the morning of surgery. Drink in one sitting. Do not sip.  This drink was given  to you during your hospital pre-op appointment visit. Nothing else to drink after completing the Pre-Surgery Clear Ensure or G2 : No candy, chewing gum or throat lozenges.    Oral Hygiene is also important to reduce your risk of infection.        Remember - BRUSH YOUR TEETH THE MORNING OF SURGERY WITH YOUR REGULAR TOOTHPASTE  Do NOT smoke after Midnight the night before surgery.  STOP TAKING all Vitamins, Herbs and supplements 1 week before your surgery.   Take ONLY these medicines the morning of surgery with A SIP OF WATER: tamsulosin .Use eye drops as usual and inhalers(bring them).                 You may not have any metal on your body including hair pins, jewelry, and body piercing  Do not wear lotions, powders, perfumes / cologne, or deodorant.  Men may shave face and neck.  Contacts, Hearing Aids, dentures or bridgework may not be worn into surgery. DENTURES WILL BE REMOVED PRIOR TO SURGERY PLEASE DO NOT APPLY Poly grip OR ADHESIVES!!!  You may bring a small overnight bag with you on the day of surgery, only pack items that are not valuable. [facility] IS NOT RESPONSIBLE   FOR VALUABLES THAT ARE LOST OR STOLEN.   Patients discharged on the day of surgery will not be allowed to drive home.  Someone NEEDS to stay with you for the first 24 hours  after anesthesia.  Do not bring your home medications to the hospital. The Pharmacy will dispense medications listed on your medication list to you during your admission in the Hospital.  Special Instructions: Bring a copy of your healthcare power of attorney and living will documents the day of surgery, if you wish to have them scanned into your [facility] Records- EPIC  Please read over the following fact sheets you were given: IF YOU HAVE QUESTIONS ABOUT YOUR PRE-OP INSTRUCTIONS, PLEASE CALL [phone]  PATIENT SIGNATURE_________________________________  NURSE SIGNATURE__________________________________  ________________________________________________________________________  Pre-operative 4 CHG Bath Instructions  DYNA-Hex 4 Chlorhexidine  Gluconate 4% Solution Antiseptic 4 fl. oz   You can play a key role in reducing the risk of infection after surgery. Your skin needs to be as free of germs as possible. You can reduce the number of germs on your skin by washing with CHG (chlorhexidine  gluconate) soap before surgery. CHG is an antiseptic soap that kills germs and continues to kill germs even after washing.   DO NOT use if you have an allergy to chlorhexidine /CHG or antibacterial soaps. If your skin becomes reddened or irritated, stop using the CHG and notify one of our RNs at   Please shower with the CHG soap starting 4 days before surgery using the following schedule:     Please keep in mind the following:  DO NOT shave, including legs and underarms, starting the day of your first shower.   You may shave your face at any point before/day of surgery.  Place clean sheets on your bed the day you start using CHG soap. Use a clean washcloth (not used since being washed) for each shower. DO NOT sleep with pets once you start using the CHG.  CHG Shower Instructions:  If you choose to wash your hair and private area, wash first with your normal shampoo/soap.  After you use  shampoo/soap, rinse your hair and body thoroughly to remove shampoo/soap residue.  Turn the water OFF and apply about 3 tablespoons (45 ml) of CHG soap  to a [employer].  Apply CHG soap ONLY FROM YOUR NECK DOWN TO YOUR TOES (washing for 3-5 minutes)  DO NOT use CHG soap on face, private areas, open wounds, or sores.  Pay special attention to the area where your surgery is being performed.  If you are having back surgery, having someone wash your back for you may be helpful. Wait 2 minutes after CHG soap is applied, then you may rinse off the CHG soap.  Pat dry with a clean towel  Put on clean clothes/pajamas   If you choose to wear lotion, please use ONLY the CHG-compatible lotions on the back of this paper.     Additional instructions for the day of surgery: DO NOT APPLY any lotions, deodorants, cologne, or perfumes.   Put on clean/comfortable clothes.  Brush your teeth.  Ask your nurse before applying any prescription medications to the skin.   CHG Compatible Lotions   Aveeno Moisturizing lotion  Cetaphil Moisturizing Cream  Cetaphil Moisturizing Lotion  Clairol Herbal Essence Moisturizing Lotion, Dry Skin  Clairol Herbal Essence Moisturizing Lotion, Extra Dry Skin  Clairol Herbal Essence Moisturizing Lotion, Normal Skin  Curel Age Defying Therapeutic Moisturizing Lotion with Alpha Hydroxy  Curel Extreme Care Body Lotion  Curel Soothing Hands Moisturizing Hand Lotion  Curel Therapeutic Moisturizing Cream, Fragrance-Free  Curel Therapeutic Moisturizing Lotion, Fragrance-Free  Curel Therapeutic Moisturizing Lotion, Original Formula  Eucerin Daily Replenishing Lotion  Eucerin Dry Skin Therapy Plus Alpha Hydroxy Crme  Eucerin Dry Skin Therapy Plus Alpha Hydroxy Lotion  Eucerin Original Crme  Eucerin Original Lotion  Eucerin Plus Crme Eucerin Plus Lotion  Eucerin TriLipid Replenishing Lotion  Keri Anti-Bacterial Hand Lotion  Keri Deep Conditioning Original Lotion Dry Skin  Formula Softly Scented  Keri Deep Conditioning Original Lotion, Fragrance Free Sensitive Skin Formula  Keri Lotion Fast Absorbing Fragrance Free Sensitive Skin Formula  Keri Lotion Fast Absorbing Softly Scented Dry Skin Formula  Keri Original Lotion  Keri Skin Renewal Lotion Keri Silky Smooth Lotion  Keri Silky Smooth Sensitive Skin Lotion  Nivea Body Creamy Conditioning Oil  Nivea Body Extra Enriched Lotion  Nivea Body Original Lotion  Nivea Body Sheer Moisturizing Lotion Nivea Crme  Nivea Skin Firming Lotion  NutraDerm 30 Skin Lotion  NutraDerm Skin Lotion  NutraDerm Therapeutic Skin Cream  NutraDerm Therapeutic Skin Lotion  ProShield Protective Hand Cream  Provon moisturizing lotion  Incentive Spirometer  An incentive spirometer is a tool that can help keep your lungs clear and active. This tool measures how well you are filling your lungs with each breath. Taking long deep breaths may help reverse or decrease the chance of developing breathing (pulmonary) problems (especially infection) following: A long period of time when you are unable to move or be active. BEFORE THE PROCEDURE  If the spirometer includes an indicator to show your best effort, your nurse or respiratory therapist will set it to a desired goal. If possible, sit up straight or lean slightly forward. Try not to slouch. Hold the incentive spirometer in an upright position. INSTRUCTIONS FOR USE  Sit on the edge of your bed if possible, or sit up as far as you can in bed or on a chair. Hold the incentive spirometer in an upright position. Breathe out normally. Place the mouthpiece in your mouth and seal your lips tightly around it. Breathe in slowly and as deeply as possible, raising the piston or the ball toward the top of the column. Hold your breath for 3-5 seconds or for  as long as possible. Allow the piston or ball to fall to the bottom of the column. Remove the mouthpiece from your mouth and breathe out  normally. Rest for a few seconds and repeat Steps 1 through 7 at least 10 times every 1-2 hours when you are awake. Take your time and take a few normal breaths between deep breaths. The spirometer may include an indicator to show your best effort. Use the indicator as a goal to work toward during each repetition. After each set of 10 deep breaths, practice coughing to be sure your lungs are clear. If you have an incision (the cut made at the time of surgery), support your incision when coughing by placing a pillow or rolled up towels firmly against it. Once you are able to get out of bed, walk around indoors and cough well. You may stop using the incentive spirometer when instructed by your caregiver.  RISKS AND COMPLICATIONS Take your time so you do not get dizzy or light-headed. If you are in pain, you may need to take or ask for pain medication before doing incentive spirometry. It is harder to take a deep breath if you are having pain. AFTER USE Rest and breathe slowly and easily. It can be helpful to keep track of a log of your progress. Your caregiver can provide you with a simple table to help with this. If you are using the spirometer at home, follow these instructions: SEEK MEDICAL CARE IF:  You are having difficultly using the spirometer. You have trouble using the spirometer as often as instructed. Your pain medication is not giving enough relief while using the spirometer. You develop fever of 100.5 F (38.1 C) or higher. SEEK IMMEDIATE MEDICAL CARE IF:  You cough up bloody sputum that had not been present before. You develop fever of 102 F (38.9 C) or greater. You develop worsening pain at or near the incision site. MAKE SURE YOU:  Understand these instructions. Will watch your condition. Will get help right away if you are not doing well or get worse. Document Released: 05/05/2006 Document Revised: 03/17/2011 Document Reviewed: 07/06/2006 [facility] Patient Information  2014 [place], [place].   ________________________________________________________________________

## 2024-02-12 ENCOUNTER — Other Ambulatory Visit: Payer: Self-pay

## 2024-02-12 ENCOUNTER — Encounter (HOSPITAL_COMMUNITY): Admission: RE | Admit: 2024-02-12

## 2024-02-12 ENCOUNTER — Encounter (HOSPITAL_COMMUNITY): Payer: Self-pay

## 2024-02-12 VITALS — BP 116/62 | HR 70 | Temp 97.9°F | Ht 75.0 in

## 2024-02-12 DIAGNOSIS — Z01818 Encounter for other preprocedural examination: Secondary | ICD-10-CM

## 2024-02-12 DIAGNOSIS — I10 Essential (primary) hypertension: Secondary | ICD-10-CM

## 2024-02-12 HISTORY — DX: Essential (primary) hypertension: I10

## 2024-02-12 HISTORY — DX: Cardiac arrhythmia, unspecified: I49.9

## 2024-02-12 LAB — BASIC METABOLIC PANEL WITH GFR
Anion gap: 9 (ref 5–15)
BUN: 21 mg/dL — ABNORMAL HIGH (ref 6–20)
CO2: 25 mmol/L (ref 22–32)
Calcium: 9.3 mg/dL (ref 8.9–10.3)
Chloride: 98 mmol/L (ref 98–111)
Creatinine, Ser: 1.23 mg/dL (ref 0.61–1.24)
GFR, Estimated: >60 mL/min
Glucose, Bld: 76 mg/dL (ref 70–99)
Potassium: 4.2 mmol/L (ref 3.5–5.1)
Sodium: 132 mmol/L — ABNORMAL LOW (ref 135–145)

## 2024-02-12 LAB — CBC
HCT: 36.7 % — ABNORMAL LOW (ref 39.0–52.0)
Hemoglobin: 11.2 g/dL — ABNORMAL LOW (ref 13.0–17.0)
MCH: 24.8 pg — ABNORMAL LOW (ref 26.0–34.0)
MCHC: 30.5 g/dL (ref 30.0–36.0)
MCV: 81.4 fL (ref 80.0–100.0)
Platelets: 247 10*3/uL (ref 150–400)
RBC: 4.51 MIL/uL (ref 4.22–5.81)
RDW: 13.2 % (ref 11.5–15.5)
WBC: 5.4 10*3/uL (ref 4.0–10.5)
nRBC: 0 % (ref 0.0–0.2)

## 2024-02-12 LAB — SURGICAL PCR SCREEN
MRSA, PCR: NEGATIVE
Staphylococcus aureus: NEGATIVE

## 2024-02-12 LAB — TYPE AND SCREEN
ABO/RH(D): B POS
Antibody Screen: NEGATIVE

## 2024-02-12 NOTE — Telephone Encounter (Signed)
 Patient was scheduled for a video visit (per patient request) 03/01/2024. Nfn

## 2024-02-12 NOTE — Progress Notes (Signed)
 For Anesthesia: PCP - Zollie Lowers, MD  Cardiologist - N/A Pulm- Mannam LOV 09/01/23 - clearance dated 12/15/23 in Media tab dated 01/21/24.  Bowel Prep reminder:N/A  Chest x-ray - CT chest: 06/03/23 EKG - 02/16/23 Stress Test -  ECHO - 07/30/23 Cardiac Cath -  Pacemaker/ICD device last checked: Pacemaker orders received: Device Rep notified:  Spinal Cord Stimulator:N/A  Sleep Study - N/A CPAP -   Fasting Blood Sugar -  Checks Blood Sugar _____ times a day Date and result of last Hgb A1c-  Last dose of GLP1 agonist- N/A GLP1 instructions: Hold 7 days prior to schedule (Hold 24 hours-daily)   Last dose of SGLT-2 inhibitors- N/A SGLT-2 instructions: Hold 72 hours prior to surgery  Blood Thinner Instructions: Last Dose: Time last taken:  Aspirin  Instructions:N/A Last Dose: Time last taken:  Activity level: Can go up a flight of stairs and activities of daily living without stopping and without chest pain and/or shortness of breath   Able to exercise without chest pain and/or shortness of breath  Anesthesia review: Hx: HTN,CKD,Right BBB,Sarcoidosis..  Patient denies shortness of breath, fever, cough and chest pain at PAT appointment   Patient verbalized understanding of instructions that were reviewed over the telephone.

## 2024-02-22 ENCOUNTER — Ambulatory Visit (HOSPITAL_COMMUNITY): Admission: RE | Admit: 2024-02-22 | Admitting: Orthopedic Surgery

## 2024-02-22 ENCOUNTER — Encounter (HOSPITAL_COMMUNITY): Admission: RE | Payer: Self-pay | Source: Ambulatory Visit

## 2024-02-22 SURGERY — ARTHROPLASTY, HIP, TOTAL, ANTERIOR APPROACH
Anesthesia: Choice | Site: Hip | Laterality: Right

## 2024-03-01 ENCOUNTER — Ambulatory Visit: Admitting: Pulmonary Disease
